# Patient Record
Sex: Female | Born: 1935 | Race: White | Hispanic: No | Marital: Married | Smoking: Former smoker
Health system: Southern US, Community
[De-identification: ages and names within clinical notes are randomized; demographics above are authoritative.]

## PROBLEM LIST (undated history)

## (undated) DIAGNOSIS — D649 Anemia, unspecified: Secondary | ICD-10-CM

## (undated) DIAGNOSIS — R519 Headache, unspecified: Secondary | ICD-10-CM

## (undated) DIAGNOSIS — M940 Chondrocostal junction syndrome [Tietze]: Secondary | ICD-10-CM

## (undated) DIAGNOSIS — I5181 Takotsubo syndrome: Secondary | ICD-10-CM

## (undated) DIAGNOSIS — J189 Pneumonia, unspecified organism: Secondary | ICD-10-CM

## (undated) DIAGNOSIS — L0390 Cellulitis, unspecified: Secondary | ICD-10-CM

## (undated) DIAGNOSIS — E039 Hypothyroidism, unspecified: Secondary | ICD-10-CM

## (undated) DIAGNOSIS — G250 Essential tremor: Secondary | ICD-10-CM

## (undated) DIAGNOSIS — B029 Zoster without complications: Secondary | ICD-10-CM

## (undated) DIAGNOSIS — I219 Acute myocardial infarction, unspecified: Secondary | ICD-10-CM

## (undated) DIAGNOSIS — M1990 Unspecified osteoarthritis, unspecified site: Secondary | ICD-10-CM

## (undated) DIAGNOSIS — R51 Headache: Secondary | ICD-10-CM

## (undated) HISTORY — DX: Hypothyroidism, unspecified: E03.9

## (undated) HISTORY — DX: Takotsubo syndrome: I51.81

## (undated) HISTORY — DX: Chondrocostal junction syndrome (tietze): M94.0

## (undated) HISTORY — DX: Unspecified osteoarthritis, unspecified site: M19.90

## (undated) HISTORY — DX: Anemia, unspecified: D64.9

## (undated) HISTORY — DX: Cellulitis, unspecified: L03.90

## (undated) HISTORY — PX: ABDOMINAL HYSTERECTOMY: SHX81

## (undated) HISTORY — PX: INGUINAL HERNIA REPAIR: SUR1180

---

## 1997-04-20 ENCOUNTER — Ambulatory Visit (HOSPITAL_COMMUNITY): Admission: RE | Admit: 1997-04-20 | Discharge: 1997-04-20 | Payer: Self-pay | Admitting: *Deleted

## 1998-04-02 ENCOUNTER — Ambulatory Visit (HOSPITAL_COMMUNITY): Admission: RE | Admit: 1998-04-02 | Discharge: 1998-04-02 | Payer: Self-pay | Admitting: Gynecology

## 1998-04-02 ENCOUNTER — Encounter: Payer: Self-pay | Admitting: Gynecology

## 1998-04-16 ENCOUNTER — Other Ambulatory Visit: Admission: RE | Admit: 1998-04-16 | Discharge: 1998-04-16 | Payer: Self-pay | Admitting: Gynecology

## 1999-04-08 ENCOUNTER — Encounter: Payer: Self-pay | Admitting: Gynecology

## 1999-04-08 ENCOUNTER — Ambulatory Visit (HOSPITAL_COMMUNITY): Admission: RE | Admit: 1999-04-08 | Discharge: 1999-04-08 | Payer: Self-pay | Admitting: Gynecology

## 1999-05-13 ENCOUNTER — Encounter: Payer: Self-pay | Admitting: Gynecology

## 1999-05-13 ENCOUNTER — Encounter: Admission: RE | Admit: 1999-05-13 | Discharge: 1999-05-13 | Payer: Self-pay | Admitting: Gynecology

## 2000-04-12 ENCOUNTER — Ambulatory Visit (HOSPITAL_COMMUNITY): Admission: RE | Admit: 2000-04-12 | Discharge: 2000-04-12 | Payer: Self-pay | Admitting: Gynecology

## 2000-04-12 ENCOUNTER — Encounter: Payer: Self-pay | Admitting: Gynecology

## 2000-04-20 ENCOUNTER — Other Ambulatory Visit: Admission: RE | Admit: 2000-04-20 | Discharge: 2000-04-20 | Payer: Self-pay | Admitting: Gynecology

## 2001-03-28 ENCOUNTER — Encounter: Admission: RE | Admit: 2001-03-28 | Discharge: 2001-03-28 | Payer: Self-pay | Admitting: Family Medicine

## 2001-03-28 ENCOUNTER — Encounter: Payer: Self-pay | Admitting: Family Medicine

## 2001-04-14 ENCOUNTER — Ambulatory Visit (HOSPITAL_COMMUNITY): Admission: RE | Admit: 2001-04-14 | Discharge: 2001-04-14 | Payer: Self-pay | Admitting: Gynecology

## 2001-04-14 ENCOUNTER — Encounter: Payer: Self-pay | Admitting: Gynecology

## 2001-04-26 ENCOUNTER — Other Ambulatory Visit: Admission: RE | Admit: 2001-04-26 | Discharge: 2001-04-26 | Payer: Self-pay | Admitting: Gynecology

## 2002-04-17 ENCOUNTER — Ambulatory Visit (HOSPITAL_COMMUNITY): Admission: RE | Admit: 2002-04-17 | Discharge: 2002-04-17 | Payer: Self-pay | Admitting: Gynecology

## 2002-04-17 ENCOUNTER — Encounter: Payer: Self-pay | Admitting: Gynecology

## 2002-05-02 ENCOUNTER — Other Ambulatory Visit: Admission: RE | Admit: 2002-05-02 | Discharge: 2002-05-02 | Payer: Self-pay | Admitting: Gynecology

## 2003-04-20 ENCOUNTER — Ambulatory Visit (HOSPITAL_COMMUNITY): Admission: RE | Admit: 2003-04-20 | Discharge: 2003-04-20 | Payer: Self-pay | Admitting: Gynecology

## 2003-05-08 ENCOUNTER — Other Ambulatory Visit: Admission: RE | Admit: 2003-05-08 | Discharge: 2003-05-08 | Payer: Self-pay | Admitting: Gynecology

## 2003-12-19 ENCOUNTER — Ambulatory Visit (HOSPITAL_COMMUNITY): Admission: RE | Admit: 2003-12-19 | Discharge: 2003-12-19 | Payer: Self-pay | Admitting: Gynecology

## 2003-12-19 ENCOUNTER — Ambulatory Visit (HOSPITAL_BASED_OUTPATIENT_CLINIC_OR_DEPARTMENT_OTHER): Admission: RE | Admit: 2003-12-19 | Discharge: 2003-12-19 | Payer: Self-pay | Admitting: Gynecology

## 2003-12-19 ENCOUNTER — Encounter (INDEPENDENT_AMBULATORY_CARE_PROVIDER_SITE_OTHER): Payer: Self-pay | Admitting: *Deleted

## 2004-04-24 ENCOUNTER — Ambulatory Visit (HOSPITAL_COMMUNITY): Admission: RE | Admit: 2004-04-24 | Discharge: 2004-04-24 | Payer: Self-pay | Admitting: Gynecology

## 2004-05-13 ENCOUNTER — Other Ambulatory Visit: Admission: RE | Admit: 2004-05-13 | Discharge: 2004-05-13 | Payer: Self-pay | Admitting: Gynecology

## 2005-05-04 ENCOUNTER — Ambulatory Visit (HOSPITAL_COMMUNITY): Admission: RE | Admit: 2005-05-04 | Discharge: 2005-05-04 | Payer: Self-pay | Admitting: Family Medicine

## 2005-07-27 ENCOUNTER — Encounter: Admission: RE | Admit: 2005-07-27 | Discharge: 2005-07-27 | Payer: Self-pay | Admitting: Endocrinology

## 2005-08-14 ENCOUNTER — Encounter: Admission: RE | Admit: 2005-08-14 | Discharge: 2005-08-14 | Payer: Self-pay | Admitting: Endocrinology

## 2006-05-06 ENCOUNTER — Ambulatory Visit (HOSPITAL_COMMUNITY): Admission: RE | Admit: 2006-05-06 | Discharge: 2006-05-06 | Payer: Self-pay | Admitting: Family Medicine

## 2006-05-25 ENCOUNTER — Ambulatory Visit (HOSPITAL_COMMUNITY): Admission: RE | Admit: 2006-05-25 | Discharge: 2006-05-25 | Payer: Self-pay | Admitting: Family Medicine

## 2007-04-24 ENCOUNTER — Emergency Department (HOSPITAL_COMMUNITY): Admission: EM | Admit: 2007-04-24 | Discharge: 2007-04-24 | Payer: Self-pay | Admitting: Emergency Medicine

## 2007-05-18 ENCOUNTER — Ambulatory Visit (HOSPITAL_COMMUNITY): Admission: RE | Admit: 2007-05-18 | Discharge: 2007-05-18 | Payer: Self-pay | Admitting: Family Medicine

## 2008-05-21 ENCOUNTER — Ambulatory Visit (HOSPITAL_COMMUNITY): Admission: RE | Admit: 2008-05-21 | Discharge: 2008-05-21 | Payer: Self-pay | Admitting: Family Medicine

## 2008-05-29 ENCOUNTER — Ambulatory Visit (HOSPITAL_COMMUNITY): Admission: RE | Admit: 2008-05-29 | Discharge: 2008-05-29 | Payer: Self-pay | Admitting: Family Medicine

## 2008-09-02 ENCOUNTER — Inpatient Hospital Stay (HOSPITAL_COMMUNITY): Admission: EM | Admit: 2008-09-02 | Discharge: 2008-09-03 | Payer: Self-pay | Admitting: Emergency Medicine

## 2008-09-08 ENCOUNTER — Ambulatory Visit (HOSPITAL_COMMUNITY): Admission: RE | Admit: 2008-09-08 | Discharge: 2008-09-08 | Payer: Self-pay | Admitting: Gastroenterology

## 2008-10-07 ENCOUNTER — Emergency Department (HOSPITAL_COMMUNITY): Admission: EM | Admit: 2008-10-07 | Discharge: 2008-10-07 | Payer: Self-pay | Admitting: Emergency Medicine

## 2008-10-26 ENCOUNTER — Ambulatory Visit (HOSPITAL_COMMUNITY): Admission: RE | Admit: 2008-10-26 | Discharge: 2008-10-26 | Payer: Self-pay | Admitting: General Surgery

## 2008-12-27 ENCOUNTER — Emergency Department (HOSPITAL_COMMUNITY): Admission: EM | Admit: 2008-12-27 | Discharge: 2008-12-27 | Payer: Self-pay | Admitting: Emergency Medicine

## 2009-02-12 ENCOUNTER — Emergency Department (HOSPITAL_COMMUNITY): Admission: EM | Admit: 2009-02-12 | Discharge: 2009-02-12 | Payer: Self-pay | Admitting: Emergency Medicine

## 2009-05-23 ENCOUNTER — Ambulatory Visit (HOSPITAL_COMMUNITY): Admission: RE | Admit: 2009-05-23 | Discharge: 2009-05-23 | Payer: Self-pay | Admitting: Family Medicine

## 2009-06-18 ENCOUNTER — Emergency Department (HOSPITAL_COMMUNITY): Admission: EM | Admit: 2009-06-18 | Discharge: 2009-06-18 | Payer: Self-pay | Admitting: Emergency Medicine

## 2009-10-19 ENCOUNTER — Emergency Department (HOSPITAL_COMMUNITY): Admission: EM | Admit: 2009-10-19 | Discharge: 2009-10-19 | Payer: Self-pay | Admitting: Emergency Medicine

## 2010-03-16 ENCOUNTER — Encounter: Payer: Self-pay | Admitting: Family Medicine

## 2010-04-07 ENCOUNTER — Ambulatory Visit (HOSPITAL_COMMUNITY)
Admission: RE | Admit: 2010-04-07 | Discharge: 2010-04-07 | Disposition: A | Discharge status: Home / Self Care | Payer: Medicare Other | Source: Ambulatory Visit | Attending: Family Medicine | Admitting: Family Medicine

## 2010-04-07 DIAGNOSIS — M79609 Pain in unspecified limb: Secondary | ICD-10-CM

## 2010-04-07 DIAGNOSIS — M7989 Other specified soft tissue disorders: Secondary | ICD-10-CM | POA: Insufficient documentation

## 2010-04-14 ENCOUNTER — Other Ambulatory Visit (HOSPITAL_COMMUNITY): Payer: Self-pay | Admitting: Family Medicine

## 2010-04-14 DIAGNOSIS — Z1231 Encounter for screening mammogram for malignant neoplasm of breast: Secondary | ICD-10-CM

## 2010-05-13 LAB — DIFFERENTIAL
Basophils Relative: 1 % (ref 0–1)
Eosinophils Absolute: 0 10*3/uL (ref 0.0–0.7)
Eosinophils Relative: 0 % (ref 0–5)
Lymphs Abs: 1.4 10*3/uL (ref 0.7–4.0)
Monocytes Relative: 12 % (ref 3–12)
Neutrophils Relative %: 62 % (ref 43–77)

## 2010-05-13 LAB — URINE MICROSCOPIC-ADD ON

## 2010-05-13 LAB — COMPREHENSIVE METABOLIC PANEL
ALT: 20 U/L (ref 0–35)
Albumin: 3.2 g/dL — ABNORMAL LOW (ref 3.5–5.2)
Chloride: 93 mEq/L — ABNORMAL LOW (ref 96–112)
GFR calc Af Amer: >60 mL/min (ref >60)
Glucose, Bld: 101 mg/dL — ABNORMAL HIGH (ref 70–99)
Potassium: 3.5 mEq/L (ref 3.5–5.1)
Sodium: 124 mEq/L — ABNORMAL LOW (ref 135–145)

## 2010-05-13 LAB — CBC
Hemoglobin: 7.8 g/dL — ABNORMAL LOW (ref 12.0–15.0)
MCHC: 34.3 g/dL (ref 30.0–36.0)
MCV: 73.1 fL — ABNORMAL LOW (ref 78.0–100.0)
Platelets: 385 10*3/uL (ref 150–400)

## 2010-05-13 LAB — URINALYSIS, ROUTINE W REFLEX MICROSCOPIC
Glucose, UA: NEGATIVE mg/dL
Hgb urine dipstick: NEGATIVE
Ketones, ur: NEGATIVE mg/dL
Nitrite: NEGATIVE

## 2010-05-13 LAB — URINE CULTURE

## 2010-05-26 ENCOUNTER — Ambulatory Visit (HOSPITAL_COMMUNITY): Payer: Medicare Other

## 2010-05-26 ENCOUNTER — Ambulatory Visit (HOSPITAL_COMMUNITY)
Admission: RE | Admit: 2010-05-26 | Discharge: 2010-05-26 | Disposition: A | Discharge status: Home / Self Care | Payer: Medicare Other | Source: Ambulatory Visit | Attending: Family Medicine | Admitting: Family Medicine

## 2010-05-26 DIAGNOSIS — Z1231 Encounter for screening mammogram for malignant neoplasm of breast: Secondary | ICD-10-CM | POA: Insufficient documentation

## 2010-05-26 LAB — BASIC METABOLIC PANEL
CO2: 27 mEq/L (ref 19–32)
Chloride: 92 mEq/L — ABNORMAL LOW (ref 96–112)
GFR calc Af Amer: >60 mL/min (ref >60)
Potassium: 3.8 mEq/L (ref 3.5–5.1)
Sodium: 125 mEq/L — ABNORMAL LOW (ref 135–145)

## 2010-05-26 LAB — URINALYSIS, ROUTINE W REFLEX MICROSCOPIC
Glucose, UA: NEGATIVE mg/dL
Hgb urine dipstick: NEGATIVE
Ketones, ur: NEGATIVE mg/dL
Protein, ur: NEGATIVE mg/dL
Urobilinogen, UA: 0.2 mg/dL (ref 0.0–1.0)

## 2010-05-26 LAB — URINE MICROSCOPIC-ADD ON

## 2010-05-26 LAB — POCT CARDIAC MARKERS
CKMB, poc: 1.8 ng/mL (ref 1.0–8.0)
CKMB, poc: 2.8 ng/mL (ref 1.0–8.0)
Myoglobin, poc: 101 ng/mL (ref 12–200)
Myoglobin, poc: 62.9 ng/mL (ref 12–200)
Troponin i, poc: <0.05 ng/mL (ref 0.00–0.09)

## 2010-05-26 LAB — CBC
HCT: 30.1 % — ABNORMAL LOW (ref 36.0–46.0)
Hemoglobin: 10.1 g/dL — ABNORMAL LOW (ref 12.0–15.0)
MCHC: 33.5 g/dL (ref 30.0–36.0)
MCV: 90.1 fL (ref 78.0–100.0)
Platelets: 310 10*3/uL (ref 150–400)
RBC: 3.34 MIL/uL — ABNORMAL LOW (ref 3.87–5.11)
RDW: 13.4 % (ref 11.5–15.5)
WBC: 8.7 10*3/uL (ref 4.0–10.5)

## 2010-05-26 LAB — DIFFERENTIAL
Basophils Relative: 1 % (ref 0–1)
Lymphocytes Relative: 9 % — ABNORMAL LOW (ref 12–46)
Lymphs Abs: 0.8 10*3/uL (ref 0.7–4.0)
Monocytes Relative: 9 % (ref 3–12)
Neutro Abs: 7.1 10*3/uL (ref 1.7–7.7)
Neutrophils Relative %: 82 % — ABNORMAL HIGH (ref 43–77)

## 2010-05-26 LAB — CK TOTAL AND CKMB (NOT AT ARMC): Total CK: 101 U/L (ref 7–177)

## 2010-05-30 LAB — BASIC METABOLIC PANEL
CO2: 30 mEq/L (ref 19–32)
Chloride: 98 mEq/L (ref 96–112)
Creatinine, Ser: 0.65 mg/dL (ref 0.4–1.2)
GFR calc Af Amer: >60 mL/min (ref >60)

## 2010-05-31 LAB — DIFFERENTIAL
Eosinophils Absolute: 0 10*3/uL (ref 0.0–0.7)
Eosinophils Relative: 0 % (ref 0–5)
Lymphs Abs: 0.5 10*3/uL — ABNORMAL LOW (ref 0.7–4.0)
Monocytes Relative: 4 % (ref 3–12)
Neutrophils Relative %: 92 % — ABNORMAL HIGH (ref 43–77)

## 2010-05-31 LAB — URINE CULTURE: Colony Count: NO GROWTH

## 2010-05-31 LAB — URINE MICROSCOPIC-ADD ON

## 2010-05-31 LAB — BASIC METABOLIC PANEL
CO2: 30 mEq/L (ref 19–32)
Glucose, Bld: 79 mg/dL (ref 70–99)
Potassium: 3.9 mEq/L (ref 3.5–5.1)
Sodium: 129 mEq/L — ABNORMAL LOW (ref 135–145)

## 2010-05-31 LAB — URINALYSIS, ROUTINE W REFLEX MICROSCOPIC
Glucose, UA: NEGATIVE mg/dL
Ketones, ur: NEGATIVE mg/dL
Leukocytes, UA: NEGATIVE
Specific Gravity, Urine: 1.02 (ref 1.005–1.030)
pH: 8.5 — ABNORMAL HIGH (ref 5.0–8.0)

## 2010-05-31 LAB — CBC
HCT: 33.7 % — ABNORMAL LOW (ref 36.0–46.0)
Hemoglobin: 11.6 g/dL — ABNORMAL LOW (ref 12.0–15.0)
MCHC: 34.5 g/dL (ref 30.0–36.0)
MCHC: 34.6 g/dL (ref 30.0–36.0)
RDW: 12.6 % (ref 11.5–15.5)
RDW: 13 % (ref 11.5–15.5)

## 2010-05-31 LAB — COMPREHENSIVE METABOLIC PANEL
ALT: 22 U/L (ref 0–35)
AST: 45 U/L — ABNORMAL HIGH (ref 0–37)
Calcium: 9.7 mg/dL (ref 8.4–10.5)
GFR calc Af Amer: >60 mL/min (ref >60)
Sodium: 128 mEq/L — ABNORMAL LOW (ref 135–145)
Total Protein: 7.2 g/dL (ref 6.0–8.3)

## 2010-06-01 LAB — URINE CULTURE

## 2010-06-01 LAB — DIFFERENTIAL
Basophils Relative: 0 % (ref 0–1)
Lymphs Abs: 1.7 10*3/uL (ref 0.7–4.0)
Monocytes Relative: 9 % (ref 3–12)
Neutro Abs: 5.3 10*3/uL (ref 1.7–7.7)
Neutrophils Relative %: 68 % (ref 43–77)

## 2010-06-01 LAB — CBC
Hemoglobin: 11.1 g/dL — ABNORMAL LOW (ref 12.0–15.0)
Platelets: 245 10*3/uL (ref 150–400)
RBC: 3.99 MIL/uL (ref 3.87–5.11)
RDW: 13.4 % (ref 11.5–15.5)
WBC: 7.8 10*3/uL (ref 4.0–10.5)

## 2010-06-01 LAB — STOOL CULTURE

## 2010-06-01 LAB — BASIC METABOLIC PANEL
BUN: 1 mg/dL — ABNORMAL LOW (ref 6–23)
Calcium: 8.9 mg/dL (ref 8.4–10.5)
Calcium: 9.5 mg/dL (ref 8.4–10.5)
Creatinine, Ser: 0.59 mg/dL (ref 0.4–1.2)
Creatinine, Ser: 0.62 mg/dL (ref 0.4–1.2)
GFR calc Af Amer: >60 mL/min (ref >60)
GFR calc Af Amer: >60 mL/min (ref >60)
GFR calc non Af Amer: >60 mL/min (ref >60)
Glucose, Bld: 84 mg/dL (ref 70–99)
Sodium: 134 mEq/L — ABNORMAL LOW (ref 135–145)

## 2010-06-01 LAB — URINALYSIS, ROUTINE W REFLEX MICROSCOPIC
Glucose, UA: NEGATIVE mg/dL
Hgb urine dipstick: NEGATIVE
Protein, ur: NEGATIVE mg/dL
Specific Gravity, Urine: 1.015 (ref 1.005–1.030)
pH: >9 — ABNORMAL HIGH (ref 5.0–8.0)

## 2010-06-01 LAB — HEPATIC FUNCTION PANEL
ALT: 17 U/L (ref 0–35)
AST: 28 U/L (ref 0–37)
Albumin: 3.9 g/dL (ref 3.5–5.2)
Alkaline Phosphatase: 68 U/L (ref 39–117)
Total Bilirubin: 0.2 mg/dL — ABNORMAL LOW (ref 0.3–1.2)

## 2010-06-01 LAB — CLOSTRIDIUM DIFFICILE EIA: C difficile Toxins A+B, EIA: NEGATIVE

## 2010-06-01 LAB — URINE MICROSCOPIC-ADD ON

## 2010-06-01 LAB — TSH: TSH: 4.193 u[IU]/mL (ref 0.350–4.500)

## 2010-06-01 LAB — T4, FREE: Free T4: 0.77 ng/dL — ABNORMAL LOW (ref 0.80–1.80)

## 2010-07-08 NOTE — H&P (Signed)
Charlene Vincent, Charlene Vincent            ACCOUNT NO.:  [id number]   MEDICAL RECORD NO.:  [id number]          PATIENT TYPE:  AMB   LOCATION:  DAY                           FACILITY:  [facility]   PHYSICIAN:  Tilford Pillar, MD      DATE OF BIRTH:  07/26/1934   DATE OF ADMISSION:  DATE OF DISCHARGE:  LH                              HISTORY & PHYSICAL   CHIEF COMPLAINT:  Left inguinal hernia.   HISTORY OF PRESENT ILLNESS:  The patient is a 76 year old female who  initially presented to my office with a history of a longstanding bulge  in her left groin.  She states she has had this all her life.  She has  had this since she was 76 years old.  She has noted some increase in the  size, but she has had some increasing discomfort associated with it.  She does state that by her description it reduces at night and with  palpation.  She has never had any signs or symptoms of incarceration.  She has had no nausea and vomiting.  No change in bowel movements.  No  prior history of right inguinal hernia.  After initial evaluation in my  office, the patient did wish to wait and continue conservative  management, however, with her increasing discomfort, the patient  returned at this time.  She states she has had some increase  difficulties with reduction, although she still is able to reduce it.  While it is protruding, she describes crampy, colicky pain which  resolves upon reduction.  The patient has had no emesis.  No fevers and  chills.  No skin changes over the area.   PAST MEDICAL HISTORY:  Hypothyroidism, gastroesophageal reflux disease,  and seasonal allergies.   PAST SURGICAL HISTORY:  Hysterectomy.   MEDICATIONS:  Zyrtec, Protonix, and Synthroid.   ALLERGIES:  PENICILLIN and ASPIRIN, although the patient is unaware of  what the reaction is.   SOCIAL HISTORY:  She is half pack per day smoker.  No alcohol use.  No  recreational drug use.  Occupation; she is retired.   No significant pertinent  family history.   REVIEW OF SYSTEMS:  CONSTITUTIONAL:  Unremarkable.  EYES:  Unremarkable.  EARS, NOSE, and THROAT:  Unremarkable.  RESPIRATORY:  Unremarkable.  CARDIOVASCULAR:  Unremarkable.  GASTROINTESTINAL:  As per HPI, otherwise  unremarkable.  GENITOURINARY:  Yeast infection which is resolving.  MUSCULOSKELETAL:  Unremarkable.  SKIN:  Yeast infection which is  resolving.  ENDOCRINE:  Unremarkable.  NEURO:  Unremarkable.   PHYSICAL EXAMINATION:  GENERAL:  The patient is elderly-appearing  female.  She is calm.  She is alert, oriented, although somewhat  confused as to her reason for initially being at my office.  HEENT:  Scalp, no deformities, no masses.  Eyes; pupils are equal,  round, and reactive.  Extraocular movements are intact.  No conjunctival  pallor.  Oral mucosa is pink.  Normal occlusion.  NECK:  Trachea is midline.  No cervical lymphadenopathy.  PULMONARY:  Unlabored respirations.  No wheezes.  No crackles.  She is  clear to auscultation bilaterally.  CARDIOVASCULAR:  Regular rate and rhythm.  No murmurs or gallops.  She  has 2+ radial and femoral pulses bilaterally.  ABDOMEN:  Positive bowel sounds.  Abdomen is soft, nontender.  She has a  reducible left inguinal hernia with no significant discomfort.  She has  had no additional masses.  No other hernias are apparent.  EXTREMITIES:  Warm and dry.   Pertinent labs and radiographic studies were unavailable.   ASSESSMENT AND PLAN:  Reducible left inguinal hernia.  I did have a long  discussion with the patient on both of her presentations as an  outpatient to my office in regards to the findings and the risks,  benefits, and alternatives of an inguinal herniorrhaphy with mesh.  The  patient's questions and concerns were addressed and the patient does  state understanding of the risks including but not limited to the risk  of bleeding, infection, infection of the mesh requiring removal, and  subsequent repair upon  resolution of the infection, nerve entrapment,  chronic pain paresthesias as well as possibility of intraoperative  cardiac and pulmonary events.  The patient's questions and concerns were  addressed and the patient will be consented and scheduled for operative  intervention at her earliest convenience.      Tilford Pillar, MD  Electronically Signed     BZ/MEDQ  D:  10/25/2008  T:  10/26/2008  Job:  [id number]

## 2010-07-08 NOTE — H&P (Signed)
Charlene Vincent            ACCOUNT NO.:  [id number]   MEDICAL RECORD NO.:  [id number]          PATIENT TYPE:  INP   LOCATION:  A327                          FACILITY:  [facility]   PHYSICIAN:  Margaretmary Dys, M.D.DATE OF BIRTH:  02-01-1935   DATE OF ADMISSION:  09/01/2008  DATE OF DISCHARGE:  LH                              HISTORY & PHYSICAL   ADMISSION DIAGNOSES:  1. Acute abdominal pain.  2. Diarrhea / gastroenteritis  3. Probable Crohn disease.  4. Probable urinary tract infection.   CHIEF COMPLAINT:  Abdominal pain / diarrhea of 1 day duration.   HISTORY OF PRESENT ILLNESS:  Charlene Vincent is a 76 year old female who  presented to the emergency room complaining of abdominal pain.  The  patient reports having some diffuse abdominal pain, mostly around her  umbilical area.  The pain was not really radiating, the patient reports  as colicky and crampy like.  There was also a history with multiple  diarrhea episodes.  The patient had a single episode of nausea and  vomiting in the emergency room.  She denies any fevers or chills.  The  patient has had a fair amount of bowel movement which was much more than  she has had in the past.  The patient gives a history of what appears to  be chronic diarrhea where she has a fair amount of stools every day.  The patient reports going to the bathroom almost three to four times a  day with large amount of stools.  Her weight has remained about the  same.  The patient reported having a colonoscopy performed about  2 to 3  years ago in [place] which was reported to be negative except for  some hemorrhoids.  The patient has never been told if she has Crohn  disease or any other abnormality causing her frequent bowel movements.  She denies any cough or shortness of breath.   REVIEW OF SYSTEMS:  As mentioned in history present illness above.   PAST MEDICAL HISTORY:  1. Hypothyroidism.  2. History of chronic diarrhea status post  colonoscopy about 2 years      ago, which appeared to slightly improve.  3. Gastroesophageal reflux disease.   MEDICATIONS:  The patient is on  1. Synthroid 25 mcg p.o. once a day.  2. Protonix 40 mg p.o. once a day.  3. Flonase 2 sprays in each nostril twice a day.   ALLERGIES:  1. The patient reports allergies to penicillin, she gets hives on the      rash.  2. Aspirin.  3. Seasonal allergies.   FAMILY HISTORY:  Noncontributory.  No family history of bowel trouble or  cancer, according to the patient.   SOCIAL HISTORY:  The patient is married, has been married for about 43  years.  Lives with her husband.  She smokes half pack of cigarettes a  day.  She completed ninth grade of education.  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   She denies any illicit drug use.   PHYSICAL EXAMINATION:  GENERAL:  The patient was conscious, alert,  comfortable, not in acute distress, well oriented in time, place and  person, was very pleasant.  VITAL SIGNS:  Blood pressure blood pressure was 122/70 with a pulse of  70, respiration was 20, temperature 98.5 degrees Fahrenheit.  Oxygen  saturation was 97% on room air.  HEENT: Normocephalic, atraumatic.  Oral mucosa was dry.  No exudates  were noted.  NECK:  Supple.  No JVD or lymphadenopathy.  LUNGS:  Clear clinically with good air entry bilaterally.  HEART:  S1, S2 is regular.  No S3, S4, gallops or rubs.  ABDOMEN:  Not distended, was soft, nontender.  Bowel sounds were  positive.  No masses palpable.  EXTREMITIES:  No edema.  No calf induration or tenderness was noted.  CNS:  Exam was grossly intact with no focal neurological deficits.   LABORATORY AND DIAGNOSTIC DATA:  White blood cell count 7.8, hemoglobin  of 12.8, hematocrit was 37, platelet count was 245.  There was no left  shift.  Sodium was 129, potassium is 3.7, chloride of 93, CO2 was 30,  glucose 117, BUN is 1, creatinine was 0.62.   Liver function tests were  normal.  Calcium was 9.5, lipase of 31.  Urinalysis shows hazy in  appearance, pH was elevated at 9.0, about 7-10 WBCs and a few bacteria  and this has been sent for culture.   An abdominal x-ray obtained emergency room showed possible small bowel  obstruction with changes of COPD.  On x-ray the patient also had some  diffuse osteopenia.  Subsequent CT of the abdomen and pelvis obtained  revealed mild wall thickening of the ileum, with questionable enteritis  and possible Crohn disease or infectious / inflammatory disease.  There  was no evidence of small-bowel obstruction or any other abnormalities.   ASSESSMENT/PLAN:  This is a 76 year old female who presented to  emergency room with nausea and vomiting and diarrhea which appeared to  be worse from her baseline.  This could be entirely viral  gastroenteritis.  However, the patient's history of diarrhea concerns me  over several years.  CT scan also suggesting some abnormalities or  inflammatory in the terminal ileum to suggest possible Crohn disease.   PLAN:  1. The patient be admitted to the medical floor.  2. Will place on clear liquid diet.  3. Will start on IV fluids normal saline at 75 mL an hour.  4. Will request her medical records from her primary care physician      and also from gastroenterologist in [place].  5. Will resume home medications at this time.  6. Will start on some Cipro 500 mg b.i.d. for the suspected urinary      tract infection.  This may also help if this is potential Crohn's      or acute bacterial gastroenteritis.  7. DVT prophylaxis will be with Lovenox.  8. I have also requested for a GI consult here with      gastroenterologist.  The patient reports having a colonoscopy      performed about 2 years ago with no abnormalities, according to the      patient.  We will request the records of this colonoscopy.   I have explained the above plan to the patient and indicated  this could  be a simple gastroenteritis, possibly viral versus bacteria, but I have  concerns that the patient may have a previously undiagnosed of Crohn  disease.  She verbalized full  understanding.  The patient's stools were  not bloody.      Margaretmary Dys, M.D.  Electronically Signed     AM/MEDQ  D:  09/02/2008  T:  09/02/2008  Job:  [id number]

## 2010-07-08 NOTE — Discharge Summary (Signed)
NAMEDANAKA, LLERA NO.:  [id number]   MEDICAL RECORD NO.:  [id number]          PATIENT TYPE:  INP   LOCATION:  A327                          FACILITY:  [facility]   PHYSICIAN:  Osvaldo Shipper, MD     DATE OF BIRTH:  15-Oct-1934   DATE OF ADMISSION:  09/01/2008  DATE OF DISCHARGE:  07/12/2010LH                               DISCHARGE SUMMARY   The patient's PMD is actually [facility], the name of  the [facility] is not known.   Please review H and P dictated at the time of admission for details  regarding the patient's presenting illness.   DISCHARGE DIAGNOSES:  1. Acute gastroenteritis, resolved.  2. Ileitis/enteritis on CT, likely a result of acute gastroenteritis.  3. History of chronic abdominal pain, etiology unclear.  4. History of hypothyroidism.  5. History of osteoporosis.  6. History of acid reflux disease.   BRIEF HOSPITAL COURSE:  Briefly, this is a 76 year old Caucasian female  who presented to the hospital with complaints of abdominal pain, nausea,  vomiting, and diarrhea.  The patient was afebrile, had a normal white  count.  She was found to be dehydrated with a sodium of 129.  The  patient was admitted to the hospital.  She underwent CT of the abdomen  and pelvis in the ED, which showed large amount of stool in the right  colon.  Small to moderate hiatal hernia was noted.  She was noted to  also have mild wall thickening of the ileum raising a question of  enteritis and Crohn disease.  Because of her acute symptoms, it is felt  that this likely represents gastroenteritis.  However, the patient did  tell us that she has been having chronic abdominal pain and loose stools  for the past 1-1/2 years.  She had a colonoscopy in 2009, which showed  sessile polyp, hemorrhoids, and diverticula.  The patient rapidly  improved in the hospital.  She did not have anymore nausea, vomiting,  and her diarrhea also subsided.  She has been  tolerating p.o. intake  quite well.  She has an appointment with Dr. Elnoria Howard on September 05, 2008, and  so we decided not to have our gastroenterologists see her.  She has been  told to keep this appointment with the gastroenterologist.   She was also found to have a UTI, which was treated with Cipro.  This  will be continued for few more days.  Rest of her medical issues  remained stable.   On the day of discharge, the patient is feeling well.  She wants to go  home, had 2 soft stools this morning, but no diarrhea.  Denies any  nausea or vomiting, has been ambulating with no difficulties.  Vital  signs are all stable including her blood pressure, which is 116/66.  Abdomen is soft, nontender, and nondistended.  Bowel sounds are present.  No masses were appreciated.  No hepatosplenomegaly is noted.   This morning, her white cell count is 3.7, hemoglobin is 11.1, and MCV  is 91.  Sodium is 134.   PENDING STUDIES:  TSH  and free T4 to ensure that she was adequately  treated for her hypothyroidism considering constipation that was noted  on the CT with moderate stool burden.   We would also recommend to the PMD considering her anemia this morning  to do an anemia panel and because her white cell count is also slightly  low, a B12 level may be of benefit.   She is stable for discharge.   DISCHARGE MEDICATIONS:  Include the following:  1. Cipro 500 mg b.i.d. for 5 days.  2. Protonix 40 mg daily.  3. Synthroid 25 mcg daily.  4. Flonase 2 sprays to each nostril daily.  5. Tylenol as needed.  6. Motrin as needed.  7. Metamucil to be discontinued for now until she is seen by Dr. Elnoria Howard.  8. Fosamax 70 mg weekly.   She has been told not to work till September 10, 2008.  She does house  cleaning work.   DIET:  As before.   PHYSICAL ACTIVITY:  As before.   FOLLOWUP:  Follow up with Dr. Elnoria Howard on September 05, 2008, and then the PMD as  needed.   No consultations obtained during this admission.    IMAGING STUDIES:  CT abdomen and pelvis as discussed above.  No  procedures performed during this admission.   Total time of this encounter, 35 minutes.      Osvaldo Shipper, MD  Electronically Signed     GK/MEDQ  D:  09/03/2008  T:  09/04/2008  Job:  [id number]   cc:   Jordan Hawks. Elnoria Howard, MD  Fax: [phone]   [facility]

## 2010-07-11 NOTE — Op Note (Signed)
Charlene Vincent, Charlene Vincent            ACCOUNT NO.:  [id number]   MEDICAL RECORD NO.:  [id number]          PATIENT TYPE:  AMB   LOCATION:  NESC                         FACILITY:  [facility]   PHYSICIAN:  Gretta Cool, M.D. 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   PROCEDURE:  Wide excision of multiple sites suspicious for vulvar carcinoma  in situ or VIN.   SURGEON:  Gretta Cool, M.D.   ANESTHESIA:  IV sedation and local infiltration.   DESCRIPTION OF PROCEDURE:  Under excellent anesthesia as above with local  infiltration anesthesia, multiple sites were identified suspicious for  vulvar carcinoma in situ.  The sites were anesthetized and then excised  beyond the margin by 5 mm or so.  On the patient's left, there were four  sites adjacent excised individually, but then extended as a block, and  closed with interrupted sutures of 4-0 nylon.  One other site on the left  was excised.  Just below the introitus on the posterior fourchette, there  was a pigmented area that was flat suspicious of vulvar CIN.  That was  excised with margin of 5 mm.  There were four other sites on the right labia  majora that were then shaved, excised.  Two were large enough to require  suturing.  At this point, all the specimens were submitted for pathologic  examination, isolated only as to right/left, and the posterior fourchette  pigmented lesion.      CWL/MEDQ  D:  12/19/2003  T:  12/19/2003  Job:  [id number]

## 2010-09-05 ENCOUNTER — Telehealth: Payer: Self-pay | Admitting: Cardiology

## 2010-09-05 NOTE — Telephone Encounter (Signed)
Patient experienced CP today and representative at Dr.Moreira's office would like the patient to have a cardiac exam within the next 10 days.

## 2010-09-05 NOTE — Telephone Encounter (Signed)
Per Pam in Dr. Johnna Acosta Moreira's office states she was in office today c/o some Chest pain; they did EKG which had slight change. Chest pain is off and on-not constant. Dr. Ludwig Clarks just wanted a cardiac evaluation. Will see next Friday 7/20. To fax notes and EKG.

## 2010-09-09 ENCOUNTER — Encounter: Payer: Self-pay | Admitting: Cardiology

## 2010-09-10 ENCOUNTER — Emergency Department (HOSPITAL_COMMUNITY): Payer: Medicare Other

## 2010-09-10 ENCOUNTER — Inpatient Hospital Stay (HOSPITAL_COMMUNITY)
Admission: EM | Admit: 2010-09-10 | Discharge: 2010-09-14 | DRG: 287 | Disposition: A | Discharge status: Home / Self Care | Payer: Medicare Other | Attending: Cardiology | Admitting: Cardiology

## 2010-09-10 ENCOUNTER — Encounter: Payer: Self-pay | Admitting: Cardiology

## 2010-09-10 DIAGNOSIS — I872 Venous insufficiency (chronic) (peripheral): Secondary | ICD-10-CM | POA: Diagnosis present

## 2010-09-10 DIAGNOSIS — R7989 Other specified abnormal findings of blood chemistry: Secondary | ICD-10-CM

## 2010-09-10 DIAGNOSIS — E871 Hypo-osmolality and hyponatremia: Secondary | ICD-10-CM | POA: Diagnosis present

## 2010-09-10 DIAGNOSIS — E039 Hypothyroidism, unspecified: Secondary | ICD-10-CM | POA: Diagnosis present

## 2010-09-10 DIAGNOSIS — R079 Chest pain, unspecified: Secondary | ICD-10-CM

## 2010-09-10 DIAGNOSIS — I2510 Atherosclerotic heart disease of native coronary artery without angina pectoris: Secondary | ICD-10-CM | POA: Diagnosis present

## 2010-09-10 DIAGNOSIS — J309 Allergic rhinitis, unspecified: Secondary | ICD-10-CM | POA: Diagnosis present

## 2010-09-10 DIAGNOSIS — IMO0002 Reserved for concepts with insufficient information to code with codable children: Secondary | ICD-10-CM | POA: Diagnosis present

## 2010-09-10 DIAGNOSIS — Z7982 Long term (current) use of aspirin: Secondary | ICD-10-CM

## 2010-09-10 DIAGNOSIS — I509 Heart failure, unspecified: Secondary | ICD-10-CM | POA: Diagnosis present

## 2010-09-10 DIAGNOSIS — F17200 Nicotine dependence, unspecified, uncomplicated: Secondary | ICD-10-CM | POA: Diagnosis present

## 2010-09-10 DIAGNOSIS — Y9210 Unspecified residential institution as the place of occurrence of the external cause: Secondary | ICD-10-CM | POA: Diagnosis present

## 2010-09-10 DIAGNOSIS — M1990 Unspecified osteoarthritis, unspecified site: Secondary | ICD-10-CM | POA: Diagnosis present

## 2010-09-10 DIAGNOSIS — D649 Anemia, unspecified: Secondary | ICD-10-CM | POA: Diagnosis present

## 2010-09-10 DIAGNOSIS — I5181 Takotsubo syndrome: Principal | ICD-10-CM | POA: Diagnosis present

## 2010-09-10 HISTORY — PX: CARDIAC CATHETERIZATION: SHX172

## 2010-09-10 LAB — CBC
MCV: 84.5 fL (ref 78.0–100.0)
Platelets: 230 10*3/uL (ref 150–400)
RBC: 4.66 MIL/uL (ref 3.87–5.11)
RDW: 15.4 % (ref 11.5–15.5)
WBC: 15 10*3/uL — ABNORMAL HIGH (ref 4.0–10.5)

## 2010-09-10 LAB — TROPONIN I: Troponin I: 0.76 ng/mL (ref <0.30)

## 2010-09-10 LAB — DIFFERENTIAL
Basophils Absolute: 0 10*3/uL (ref 0.0–0.1)
Basophils Relative: 0 % (ref 0–1)
Eosinophils Absolute: 0 10*3/uL (ref 0.0–0.7)
Lymphs Abs: 0.8 10*3/uL (ref 0.7–4.0)
Neutrophils Relative %: 87 % — ABNORMAL HIGH (ref 43–77)

## 2010-09-10 LAB — BASIC METABOLIC PANEL
Calcium: 9.2 mg/dL (ref 8.4–10.5)
Creatinine, Ser: 0.56 mg/dL (ref 0.50–1.10)
GFR calc Af Amer: >60 mL/min (ref >60)
GFR calc non Af Amer: >60 mL/min (ref >60)
Sodium: 126 mEq/L — ABNORMAL LOW (ref 135–145)

## 2010-09-10 LAB — PROTIME-INR
INR: 0.97 (ref 0.00–1.49)
Prothrombin Time: 13.1 seconds (ref 11.6–15.2)

## 2010-09-10 LAB — APTT: aPTT: 36 seconds (ref 24–37)

## 2010-09-10 LAB — CK TOTAL AND CKMB (NOT AT ARMC): Total CK: 135 U/L (ref 7–177)

## 2010-09-11 ENCOUNTER — Inpatient Hospital Stay (HOSPITAL_COMMUNITY): Payer: Medicare Other

## 2010-09-11 DIAGNOSIS — I517 Cardiomegaly: Secondary | ICD-10-CM

## 2010-09-11 LAB — COMPREHENSIVE METABOLIC PANEL
Albumin: 2.8 g/dL — ABNORMAL LOW (ref 3.5–5.2)
BUN: 3 mg/dL — ABNORMAL LOW (ref 6–23)
CO2: 24 mEq/L (ref 19–32)
Chloride: 91 mEq/L — ABNORMAL LOW (ref 96–112)
Creatinine, Ser: <0.47 mg/dL — ABNORMAL LOW (ref 0.50–1.10)
Glucose, Bld: 93 mg/dL (ref 70–99)
Total Bilirubin: 0.2 mg/dL — ABNORMAL LOW (ref 0.3–1.2)

## 2010-09-11 LAB — CBC
MCHC: 34.4 g/dL (ref 30.0–36.0)
MCV: 84.3 fL (ref 78.0–100.0)
Platelets: 208 10*3/uL (ref 150–400)
RDW: 15.1 % (ref 11.5–15.5)
WBC: 14.7 10*3/uL — ABNORMAL HIGH (ref 4.0–10.5)

## 2010-09-11 LAB — POCT ACTIVATED CLOTTING TIME: Activated Clotting Time: 100 seconds

## 2010-09-11 LAB — LIPID PANEL
Cholesterol: 152 mg/dL (ref 0–200)
Triglycerides: 72 mg/dL (ref <150)

## 2010-09-11 LAB — CARDIAC PANEL(CRET KIN+CKTOT+MB+TROPI)
CK, MB: 4.7 ng/mL — ABNORMAL HIGH (ref 0.3–4.0)
Relative Index: INVALID (ref 0.0–2.5)
Troponin I: 1.07 ng/mL (ref <0.30)
Troponin I: 0.54 ng/mL (ref <0.30)

## 2010-09-12 ENCOUNTER — Ambulatory Visit: Payer: Medicare Other | Admitting: Cardiology

## 2010-09-12 ENCOUNTER — Encounter: Payer: Self-pay | Admitting: *Deleted

## 2010-09-12 DIAGNOSIS — S71009A Unspecified open wound, unspecified hip, initial encounter: Secondary | ICD-10-CM

## 2010-09-12 DIAGNOSIS — S71109A Unspecified open wound, unspecified thigh, initial encounter: Secondary | ICD-10-CM

## 2010-09-12 LAB — BASIC METABOLIC PANEL
Chloride: 93 mEq/L — ABNORMAL LOW (ref 96–112)
GFR calc Af Amer: >60 mL/min (ref >60)
GFR calc non Af Amer: >60 mL/min (ref >60)
Potassium: 4.1 mEq/L (ref 3.5–5.1)

## 2010-09-12 LAB — CBC
MCHC: 34.5 g/dL (ref 30.0–36.0)
Platelets: 202 10*3/uL (ref 150–400)
RDW: 15.4 % (ref 11.5–15.5)
WBC: 8.7 10*3/uL (ref 4.0–10.5)

## 2010-09-12 LAB — SEDIMENTATION RATE: Sed Rate: 30 mm/hr — ABNORMAL HIGH (ref 0–22)

## 2010-09-13 LAB — CBC
HCT: 35.1 % — ABNORMAL LOW (ref 36.0–46.0)
Hemoglobin: 12.2 g/dL (ref 12.0–15.0)
MCH: 29.5 pg (ref 26.0–34.0)
MCHC: 34.8 g/dL (ref 30.0–36.0)
MCV: 84.8 fL (ref 78.0–100.0)
Platelets: 212 10*3/uL (ref 150–400)
RBC: 4.14 MIL/uL (ref 3.87–5.11)
RDW: 15.2 % (ref 11.5–15.5)
WBC: 8.1 10*3/uL (ref 4.0–10.5)

## 2010-09-14 DIAGNOSIS — I2510 Atherosclerotic heart disease of native coronary artery without angina pectoris: Secondary | ICD-10-CM

## 2010-09-14 LAB — CBC
HCT: 35.4 % — ABNORMAL LOW (ref 36.0–46.0)
Hemoglobin: 12.5 g/dL (ref 12.0–15.0)
MCHC: 35.3 g/dL (ref 30.0–36.0)
RDW: 14.8 % (ref 11.5–15.5)
WBC: 6.3 10*3/uL (ref 4.0–10.5)

## 2010-09-18 ENCOUNTER — Telehealth: Payer: Self-pay | Admitting: Cardiology

## 2010-09-18 NOTE — Telephone Encounter (Signed)
Pt said home health is coming out to her house to clean wound on her leg and she thinks they are not cleaning it well She wanted to talk to you

## 2010-09-18 NOTE — Telephone Encounter (Signed)
States home care nurse saw her yest and redressed wd on leg. (area that had tape on skin that was pulled off and took skin w/it). States she was told at hospital that she needed to drain blood out of it. They nurse didn't do that yest but did today. She just wanted to know if that was right. Advised that was set up by hospital. Lawson Fiscal will be seeing her next week and we look at area then. She seemed to understand that nurse is to dress and drain blood. Advised if that was what was written on d/c summary they should be doing as instructed.

## 2010-09-19 ENCOUNTER — Emergency Department (HOSPITAL_COMMUNITY)
Admission: EM | Admit: 2010-09-19 | Discharge: 2010-09-19 | Disposition: A | Discharge status: Home / Self Care | Payer: Medicare Other | Attending: Emergency Medicine | Admitting: Emergency Medicine

## 2010-09-19 DIAGNOSIS — IMO0002 Reserved for concepts with insufficient information to code with codable children: Secondary | ICD-10-CM | POA: Insufficient documentation

## 2010-09-19 DIAGNOSIS — Y92009 Unspecified place in unspecified non-institutional (private) residence as the place of occurrence of the external cause: Secondary | ICD-10-CM | POA: Insufficient documentation

## 2010-09-19 DIAGNOSIS — Z7982 Long term (current) use of aspirin: Secondary | ICD-10-CM | POA: Insufficient documentation

## 2010-09-19 DIAGNOSIS — S51809A Unspecified open wound of unspecified forearm, initial encounter: Secondary | ICD-10-CM | POA: Insufficient documentation

## 2010-09-19 DIAGNOSIS — K219 Gastro-esophageal reflux disease without esophagitis: Secondary | ICD-10-CM | POA: Insufficient documentation

## 2010-09-19 DIAGNOSIS — Z79899 Other long term (current) drug therapy: Secondary | ICD-10-CM | POA: Insufficient documentation

## 2010-09-19 DIAGNOSIS — E039 Hypothyroidism, unspecified: Secondary | ICD-10-CM | POA: Insufficient documentation

## 2010-09-19 DIAGNOSIS — R296 Repeated falls: Secondary | ICD-10-CM | POA: Insufficient documentation

## 2010-09-19 DIAGNOSIS — S1190XA Unspecified open wound of unspecified part of neck, initial encounter: Secondary | ICD-10-CM | POA: Insufficient documentation

## 2010-09-19 DIAGNOSIS — M1990 Unspecified osteoarthritis, unspecified site: Secondary | ICD-10-CM | POA: Insufficient documentation

## 2010-09-19 DIAGNOSIS — K5090 Crohn's disease, unspecified, without complications: Secondary | ICD-10-CM | POA: Insufficient documentation

## 2010-09-19 NOTE — Cardiovascular Report (Signed)
  Charlene Vincent, Charlene NO.:  [id number]  MEDICAL RECORD NO.:  [id number]  LOCATION:  2923                         FACILITY:  [facility]  PHYSICIAN:  Zymier Rodgers M. [place], M.D.  DATE OF BIRTH:  05/03/1934  DATE OF PROCEDURE:  09/11/2010 DATE OF DISCHARGE:                           CARDIAC CATHETERIZATION   INDICATIONS FOR PROCEDURE:  A 76 year old white female has a history of longstanding tobacco abuse who presents with symptoms of refractory chest pain and has positive cardiac troponins.  ECG showed T-wave inversion in the anterolateral leads.  PROCEDURE:  Left heart catheterization, coronary and left ventricular angiography.  ACCESS:  Via the right femoral artery using standard Seldinger technique.  EQUIPMENT:  A 5-French 4 cm right and left Judkins catheter, 5-French pigtail catheter, 5-French arterial sheath.  MEDICATIONS:  Local anesthesia with 1% Xylocaine, contrast 90 mL of Omnipaque.  HEMODYNAMIC DATA:  Aortic pressure is 97/54 with mean of 72 mmHg.  Left ventricle pressure is 101 over with EDP of 16 mmHg.  ANGIOGRAPHIC DATA:  That left coronary artery arises and distributes normally.  The left main coronary artery is normal.  The left anterior descending artery has 20% plaque involving the proximal midvessel and extending into the first diagonal branch.  Left circumflex coronary artery appears normal.  The right coronary artery is a dominant vessel.  It has mild calcified plaque in the midvessel with less than or equal to 10% stenosis.  Left ventricular angiography performed in the RAO view demonstrates severe hypokinesis of the mid anterior wall and mid inferior wall with relative sparing of the apex.  Overall ejection fraction is 40%.  FINAL INTERPRETATION: 1. No significant obstructive atherosclerotic coronary artery disease.     The patient has minor nonobstructive disease. 2. Moderate left ventricular dysfunction with wall motion  abnormalities involving the mid left ventricular chamber.  PLAN:  We will continue medical therapy with beta-blockers, nitrates, and aspirin.  We will obtain an echocardiogram.  We will check inflammatory markers.  It is possible this could be a variant of Takotsubo cardiomyopathy.          ______________________________ Calandria Mullings M. [place], M.D.     PMJ/MEDQ  D:  09/11/2010  T:  09/11/2010  Job:  [id number]  cc:   Arturo Morton. Riley Kill, MD, [facility] Mady Gemma, PA  Electronically Signed by Antonina Deziel [place] M.D. on 09/19/2010 08:50:33 AM

## 2010-09-22 ENCOUNTER — Telehealth: Payer: Self-pay | Admitting: Cardiology

## 2010-09-22 NOTE — Discharge Summary (Signed)
NAMEYEHUDIS, MONCEAUX NO.:  [id number]  MEDICAL RECORD NO.:  [id number]  LOCATION:  2020                         FACILITY:  [facility]  PHYSICIAN:  Vesta Mixer, M.D. DATE OF BIRTH:  Jun 29, 1934  DATE OF ADMISSION:  09/10/2010 DATE OF DISCHARGE:  09/14/2010                              DISCHARGE SUMMARY   DISCHARGE DIAGNOSES: 1. Minimal coronary artery disease. 2. Congestive heart failure - thought to be due to takotsubo like     syndrome. 3. Significant skin tear at the catheterization site associated with     pulling of the tape. 4. History of cigarette smoking, anemia - hypothyroidism - ongoing     cigarette abuse.  DISCHARGE MEDICATIONS: 1. Aspirin 81 mg a day. 2. Lisinopril 5 mg a day. 3. Metoprolol 25 mg tablets - one-half tablet twice a day. 4. Nitroglycerin 0.4 mg sublingually as needed for chest pain. 5. Crestor 40 mg a day. 6. Calcium over-the-counter once a day. 7. Iron sulfate 325 mg three times a day. 8. Fluticasone nasal spray once a day. 9. Synthroid 50 mcg a day. 10.Vitamin D once a day. 11.Zyrtec 10 mg a day.  DISPOSITION:  The patient will follow up with Dr. Swaziland in the next week or two.  She is to follow up with her general medical doctor as needed.  HISTORY:  Charlene Vincent is a 76 year old female with a history of chest pain.  She was scheduled to see Dr. Swaziland later this week, but presented to the hospital with episodes of chest pain.  She was admitted to the hospital and had mildly positive cardiac enzymes.  Please see H and P for further details.  HOSPITAL COURSE BY PROBLEMS: 1. Chest pain.  The patient's cardiac enzymes were initially mildly     elevated.  Her CPK was 135 with 8.7 MBs.  Her troponin was 1.07.     The patient had a cardiac catheterization the following morning.     She was found to have minor coronary artery irregularities.     Specifically, she had a 20% narrowing in the left anterior     descending  artery.  The left circumflex artery was normal.  The     right coronary artery was large and dominant and had perhaps at 10%     stenosis at its midpoint.  Left ventriculogram revealed moderate     left ventricular dysfunction with an ejection fraction around 30-     40%.  It appeared similar to a Takotsubo like syndrome.  Echocardiogram performed later that day confirmed an ejection fraction of 35-40%.  The patient will be discharged on the above-noted medications.  We have added lisinopril and metoprolol.  She will follow up with Dr. Swaziland. It is quite likely that she had a coronary spasm secondary to her cigarette smoking.  She is asymptomatic and is ambulating without any significant problems.  We have encouraged her to stop smoking.  1. Skin tear.  The patient's cardiac catheterization was complicated     by a skin tear when they pulled the tape off.  This resulted in a     full-thickness skin tear.  The patient was seen by [facility]  Surgery.  They concluded that there was no significant surgical     procedure needed and she just needed local treatment.  The patient     was encouraged to just keep a dry bandage on her leg.  She can     shower normally.  She can follow up with her medical doctor and/or     Dr. Swaziland if needed. 2. Anemia.  The patient will continue with her iron sulfate.  This     will be followed up by her general medical doctor. 3. Congestive heart failure.  The patient is on a low-dose metoprolol     and lisinopril.  These can be titrated upward as needed by Dr.     Swaziland. 4. Hypothyroidism - stable.     Vesta Mixer, M.D.     PJN/MEDQ  D:  09/14/2010  T:  09/14/2010  Job:  [id number]  cc:   Peter M. Swaziland, M.D. Arturo Morton. Riley Kill, MD, [facility]  Electronically Signed by Kristeen Miss M.D. on 09/22/2010 12:11:07 PM

## 2010-09-22 NOTE — Telephone Encounter (Signed)
FYI: Advance going out for wound care on right inner thigh appx 10cm.  She also had a fall and has a skin tear on her neck (10cm)  and on her left elbow (2x1 cm).  Also has a large tear on her forearm 6x4 cm.  If you need to you can fax [phone] attn [place]ila branch

## 2010-09-24 ENCOUNTER — Ambulatory Visit (INDEPENDENT_AMBULATORY_CARE_PROVIDER_SITE_OTHER): Payer: Medicare Other | Admitting: Nurse Practitioner

## 2010-09-24 ENCOUNTER — Telehealth: Payer: Self-pay | Admitting: Nurse Practitioner

## 2010-09-24 ENCOUNTER — Encounter: Payer: Self-pay | Admitting: Nurse Practitioner

## 2010-09-24 VITALS — BP 110/56 | HR 56 | Ht 67.0 in | Wt 132.6 lb

## 2010-09-24 DIAGNOSIS — I5181 Takotsubo syndrome: Secondary | ICD-10-CM

## 2010-09-24 DIAGNOSIS — I5020 Unspecified systolic (congestive) heart failure: Secondary | ICD-10-CM

## 2010-09-24 DIAGNOSIS — T148XXA Other injury of unspecified body region, initial encounter: Secondary | ICD-10-CM

## 2010-09-24 DIAGNOSIS — IMO0002 Reserved for concepts with insufficient information to code with codable children: Secondary | ICD-10-CM | POA: Insufficient documentation

## 2010-09-24 LAB — BASIC METABOLIC PANEL
BUN: 3 mg/dL — ABNORMAL LOW (ref 6–23)
CO2: 27 mEq/L (ref 19–32)
Calcium: 8.7 mg/dL (ref 8.4–10.5)
Chloride: 91 mEq/L — ABNORMAL LOW (ref 96–112)
Creatinine, Ser: 0.5 mg/dL (ref 0.4–1.2)
GFR: 125.11 mL/min (ref >60.00)
Glucose, Bld: 89 mg/dL (ref 70–99)
Potassium: 4.2 mEq/L (ref 3.5–5.1)
Sodium: 124 mEq/L — ABNORMAL LOW (ref 135–145)

## 2010-09-24 MED ORDER — LEVOFLOXACIN 250 MG PO TABS: 250.0000 mg | ORAL_TABLET | Freq: Every day | ORAL | Status: DC

## 2010-09-24 NOTE — Assessment & Plan Note (Signed)
She is to continue with her current wound care. I am worried about the tear on her forearm. I have given her a short course of Levaquin.

## 2010-09-24 NOTE — Progress Notes (Signed)
[name] Date of Birth: [birth year]   History of Present Illness: Charlene Vincent is seen back today for a post hospital visit. She is seen for Dr. Swaziland. She had positive enzymes and was cathed about 2 weeks ago. Her EF is 35 to 40%. She had questionable variant of Takotsubo. She is on low dose ACE and beta blocker. She says she feels fine. She says she is not smoking. She did have a significant skin tear from her cath site. It is healing. She fell last Friday and landed on a table. She has more skin tears on her neck and left arm. She is using peroxide and antibiotic ointment. No chest pain. She is back walking but trying to stay out of the heat. She is not dizzy.   Current Outpatient Prescriptions on File Prior to Visit  Medication Sig Dispense Refill  . aspirin 81 MG tablet Take 81 mg by mouth daily.        . cetirizine (ZYRTEC) 10 MG tablet Take 10 mg by mouth daily.        . Cholecalciferol (VITAMIN D PO) Take by mouth daily.        . ferrous sulfate 325 (65 FE) MG tablet Take 325 mg by mouth 3 (three) times daily with meals.        . fluticasone (FLOVENT DISKUS) 50 MCG/BLIST diskus inhaler Inhale 2 puffs into the lungs daily.        Marland Kitchen levothyroxine (SYNTHROID, LEVOTHROID) 50 MCG tablet Take 50 mcg by mouth daily.        Marland Kitchen lisinopril (PRINIVIL,ZESTRIL) 5 MG tablet Take 5 mg by mouth daily.        . metoprolol tartrate (LOPRESSOR) 25 MG tablet Take 25 mg by mouth 2 (two) times daily. Take 1/2 tablet twice a day       . rosuvastatin (CRESTOR) 40 MG tablet Take 40 mg by mouth daily.        Marland Kitchen levofloxacin (LEVAQUIN) 250 MG tablet Take 1 tablet (250 mg total) by mouth daily.  7 tablet  0    Allergies  Allergen Reactions  . Aspirin   . Penicillins     Past Medical History  Diagnosis Date  . CAD (coronary artery disease)     s/p cath July 2012. EF is 35 to 40%. 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 Hypothyroidism   . Anemia   . Costochondritis   . Arthritis   .  Cellulitis   . Tobacco abuse     Past Surgical History  Procedure Date  . Inguinal hernia repair   . Cardiac catheterization September 10, 2010    EF 35 to 40%. No significant CAD. ? takotsubo cardiomyopathy    History  Smoking status  . Former Smoker  . Quit date: 09/07/2010  Smokeless tobacco  . Not on file    History  Alcohol Use  . Yes    History reviewed. No pertinent family history.  Review of Systems: The review of systems is positive for recent fall. No syncope. She says she tripped and fell out of the bed and landed on a table.  All other systems were reviewed and are negative.  Physical Exam: BP 110/56  Pulse 56  Ht 5\' 7"  (1.702 m)  Wt 132 lb 9.6 oz (60.147 kg)  BMI 20.77 kg/m2 Patient is pleasant and in no acute distress. Skin is warm and dry. Color is normal. She is ecchymotic.  HEENT is unremarkable.  Normocephalic/atraumatic. PERRL. Sclera are nonicteric. Neck is supple. She has a dressing on the left side. There is a considerable skin tear noted but looks like it is healing.  No masses. No JVD. Lungs are clear. Cardiac exam shows a regular rate and rhythm. Abdomen is soft. Extremities are without edema. Left forearm dressing showed a very nasty skin tear and it looks infected. Her right groin shows a very large skin tear but it is healing nicely. Gait and ROM are intact. No gross neurologic deficits noted.  LABORATORY DATA: BMET is pending   Assessment / Plan:

## 2010-09-24 NOTE — Telephone Encounter (Signed)
Pt was calling back with meds she was missing

## 2010-09-24 NOTE — Telephone Encounter (Signed)
Called stating she was given Rx for Levaquin 500 mg but the paper from our office said 250 mg. Wants to know what to do. Advised to cut pill in 1/2 and take 1/2 every day x 7 days. Will have some left over. She verbalizes understanding.

## 2010-09-24 NOTE — Patient Instructions (Signed)
Keep doing your wound care with the hydrogen peroxide and rinsing with water I am going to give you a short course of antibiotics for the next week. That prescription is at the drug store We are going to check some labs today I will see you in a month. Stay out of the heat.

## 2010-09-24 NOTE — Assessment & Plan Note (Signed)
Her EF is 35 to 40%. She is on low dose ACE and beta blocker. I don't think her blood pressure will allow any titration of her medicines. She says her fall was not due to dizziness but I am still concerned. She will continue with her current therapy. Will need repeat echo in a couple of months. I will see her in a month. Patient is agreeable to this plan and will call if any problems develop in the interim.

## 2010-09-26 ENCOUNTER — Telehealth: Payer: Self-pay | Admitting: *Deleted

## 2010-09-26 NOTE — Telephone Encounter (Signed)
Message copied by Eugenia Pancoast on Fri Sep 26, 2010  4:48 PM ------      Message from: Rosalio Macadamia      Created: Wed Sep 24, 2010  4:43 PM       Ok to report. Continues to have low sodium levels. Discussed with Dr. Swaziland. Needs to follow up with her primary care.

## 2010-09-26 NOTE — Telephone Encounter (Signed)
Advised patient of labs 

## 2010-09-30 ENCOUNTER — Telehealth: Payer: Self-pay | Admitting: Cardiology

## 2010-09-30 NOTE — Telephone Encounter (Signed)
Pt went to PCP this morning stating she exercises every day.  Pt has questions about doing exercises after having a heart attack.  Please call pt back

## 2010-09-30 NOTE — Telephone Encounter (Signed)
Pt wants to know if it will be ok to do exercises at home until she goes on Sept 6 to hosp

## 2010-09-30 NOTE — Telephone Encounter (Signed)
Called wanting to know if she should do cardiac rehab. Was told by PCP that she needed to check w/ [place] before doing "exercises". Advised the hospital would have set her up for rehab and that she should do the program.

## 2010-10-01 ENCOUNTER — Telehealth: Payer: Self-pay | Admitting: Cardiology

## 2010-10-01 NOTE — Telephone Encounter (Signed)
Called wanting to know if can exercise at home before she starts cardiac rehab on 9/6. Advised she can do light activity.

## 2010-10-01 NOTE — Telephone Encounter (Signed)
Please call pt back regarding exercises today, pt has questions

## 2010-10-09 ENCOUNTER — Telehealth: Payer: Self-pay | Admitting: Cardiology

## 2010-10-09 NOTE — Telephone Encounter (Signed)
Called because she wanted to know if she could just take the whole pill of Metoprolol 25mg  once a day because she forgets to take the other half of the pill when she has to take it twice a day. Please call back.

## 2010-10-09 NOTE — H&P (Signed)
NAMEANNAKATE, Charlene Vincent NO.:  [id number]  MEDICAL RECORD NO.:  [id number]  LOCATION:  2923                         FACILITY:  [facility]  PHYSICIAN:  Arturo Morton. Riley Kill, MD, FACCDATE OF BIRTH:  07-11-1934  DATE OF ADMISSION:  09/10/2010 DATE OF DISCHARGE:                             HISTORY & PHYSICAL   PRIMARY CARDIOLOGIST:  The patient was scheduled to see Peter M. Swaziland, MD later this week.  PRIMARY CARE PROVIDER:  Ellard Artis, PA, [facility].  PATIENT PROFILE:  A 76 year old female without prior cardiac history who presented to the ED today after being seen by her primary care provider with complaints of back, jaw, ear, and throat discomfort and has been found to have elevated troponin 12.  PROBLEM LIST: 1. Non-ST-segment elevation myocardial infarction. 2. History of atypical chest pain and costochondritis. 3. Osteoarthritis. 4. Allergic rhinitis. 5. Anemia. 6. Hypothyroidism. 7. Venous insufficiency. 8. Ongoing tobacco abuse.     a.     The patient has smoked for 55 years but never more than half      a pack a day and currently smokes only half a cigarette a day. 9. Status post left inguinal hernia repair in December 2010.  ALLERGIES:  ASPIRIN, PENICILLIN.  HISTORY OF PRESENT ILLNESS:  A 76 year old female without prior cardiac history who was in her usual state of health until the last Friday, September 05, 2010, when she noted intermittent chest pain in her left breast without associated symptoms.  She saw her primary care provider that day with an ECG being performed, showing T-wave inversion in aVL, V1, and V2.  The patient was subsequently set up to see Dr. Swaziland this Friday, September 12, 2010.  The patient had some chest discomfort intermittently throughout the day on September 05, 2010, and also September 06, 2010, but this subsequently resolved.  She continued to exercise over the past few days without any recurrent symptoms or  limitations.  Today, however, the patient noted mid scapular/back pain along with jaw, ear, and throat discomfort.  She again saw her primary care provider and decision was made to send the patient to the ED.  EMS was called and the patient was given aspirin with some reduction in chest discomfort. Here, she is currently comfortable although her troponin is elevated at 0.76.  HOME MEDICATIONS: 1. Flonase 50 mcg 2 sprays each nostril b.i.d. 2. Synthroid 50 mcg daily. 3. Zyrtec 10 mg daily. 4. Ferrous sulfate 324 mg t.i.d.  FAMILY HISTORY:  Mother is alive and well at 75.  Father died at 52 of unknown cause.  She had total of 11 siblings, seven are still living. No history of CAD or stroke as far as she knows.  SOCIAL HISTORY:  The patient lives in [place] with [place] [address] address with her husband.  She is retired.  The patient has been smoking for 55 years but never more than a half pack a day and currently smokes half a cigarette a day.  She denies alcohol or drug use.  She is active at home and exercises several times daily and also walks regularly.  REVIEW OF SYSTEMS:  Positive for chest discomfort last week as well as jaw  pain, throat pain, back pain today.  She has had long history of dark stools related to iron therapy.  She is a full code.  Otherwise, all systems reviewed and negative.  PHYSICAL EXAMINATION:  VITAL SIGNS:  Temperature 98.1, heart rate 72, respirations 16, blood pressure 132/65, pulse ox 98% on 2 liters. GENERAL:  Pleasant white female, in no acute distress.  Awake, alert, and oriented x3.  She has normal affect. HEENT: Normal. NEUROLOGIC:  Notable for resting tremor. NECK:  Supple without bruits or JVD. LUNGS:  Respirations are regular and unlabored.  Clear to auscultation. CARDIAC:  Regular S1 and S1.  No S3, S4, or murmurs. ABDOMEN:  Round, soft, nontender, and nondistended.  Bowel sounds present x4. EXTREMITIES:  Warm, dry, pink.  No clubbing,  cyanosis, or edema. Dorsalis pedis +2, tibial pulses 2+ and equal bilaterally.  She has a normal Allen test.  Chest x-ray shows bibasilar increased markings, most notable medially, may represent atelectasis, although infectious infiltrate not excluded. Recommendation for followup until clear.  EKG shows sinus rhythm at a rate of 74, left axis, T-wave inversion in aVL, V1, and V2.  Hemoglobin 13.8, hematocrit 39.4, WBC 15.0, platelets 230.  Sodium 126, potassium 3.8, chloride 88, CO2 26, BUN less than 3, creatinine 0.56, glucose 136. Troponin 0.76, INR 0.97.  ASSESSMENT AND PLAN: 1. Non-ST-segment elevation myocardial infarction.  The patient has     chest pain last week and back, jaw, and throat pain today, reduced     with aspirin.  Troponin is elevated at 0.76.  Plan to admit and     cycle enzymes.  Discussed cath and the patient is willing to     proceed.  The patient was reported aspirin allergy but tolerated     aspirin given by EMS.  We will forego aspirin and load with Plavix     tonight and also add beta-blocker and statin.  Catheterization in     a.m. 2. Hypothyroidism.  Continue Synthroid.  Check TSH. 3. Allergic rhinitis.  Continue home meds. 4. Abnormal chest x-ray.  Followup PA and lateral chest x-ray in the     a.m.     Nicolasa Ducking, ANP   ______________________________ Arturo Morton. Riley Kill, MD, [facility]    CB/MEDQ  D:  09/10/2010  T:  09/11/2010  Job:  [id number]  Electronically Signed by Nicolasa Ducking ANP on 09/15/2010 10:40:17 AM Electronically Signed by Shawnie Pons MD [facility] on 10/09/2010 09:49:29 AM

## 2010-10-09 NOTE — Telephone Encounter (Signed)
Called wanting to know if she could take Metoprolol in mornings. She takes Metoprolol 25 mg  1/2 tablet BID. States she forgets to take the other 1/2 at night. Advised will speak w/Lori tomorrow and call her back

## 2010-10-10 ENCOUNTER — Telehealth: Payer: Self-pay | Admitting: *Deleted

## 2010-10-10 NOTE — Telephone Encounter (Signed)
Follow up call from yesterday. Lawson Fiscal states we could put her on Metoprolol succ 25 mg but will be more expensive. She states she will try to remember to take the other 1/2 pill at dinner time. Advised to put it with her tooth brush to take at bedtime or dinner would be fine.

## 2010-10-28 ENCOUNTER — Ambulatory Visit: Payer: Medicare Other | Admitting: Nurse Practitioner

## 2010-10-28 ENCOUNTER — Other Ambulatory Visit: Payer: Medicare Other | Admitting: *Deleted

## 2010-10-30 ENCOUNTER — Encounter (HOSPITAL_COMMUNITY): Payer: Self-pay

## 2010-10-30 ENCOUNTER — Encounter (HOSPITAL_COMMUNITY)
Admission: RE | Admit: 2010-10-30 | Discharge: 2010-10-30 | Disposition: A | Discharge status: Home / Self Care | Payer: Medicare Other | Source: Ambulatory Visit | Attending: Cardiology | Admitting: Cardiology

## 2010-10-30 NOTE — Patient Instructions (Signed)
During orientation advised patient on arrival and appointment times what to wear, what to do before, during and after exercise. Reviewed attendance and class policy. Talked about inclement weather and class consultation policy.   

## 2010-10-30 NOTE — Progress Notes (Signed)
Orientation completed. Pt is scheduled to start on 11/03/10 at 8:15. Pt is registered and pt records have been retrieved. Pt is eager to get started. Pt insurance has been verified.

## 2010-11-17 ENCOUNTER — Encounter: Payer: Self-pay | Admitting: Nurse Practitioner

## 2010-11-17 ENCOUNTER — Other Ambulatory Visit: Payer: Medicare Other | Admitting: *Deleted

## 2010-11-17 ENCOUNTER — Ambulatory Visit (INDEPENDENT_AMBULATORY_CARE_PROVIDER_SITE_OTHER): Payer: Medicare Other | Admitting: Nurse Practitioner

## 2010-11-17 DIAGNOSIS — I5181 Takotsubo syndrome: Secondary | ICD-10-CM

## 2010-11-17 NOTE — Patient Instructions (Signed)
We are going to repeat your ultrasound of your heart in about 2 months. You will see Dr. Swaziland afterwards Stay on your same medicines Continue with your exercise program Call for any problems.

## 2010-11-17 NOTE — Progress Notes (Signed)
Charlene Vincent Date of Birth: 1934-02-27   History of Present Illness: Charlene Vincent is seen today for a follow up visit. She is seen for Dr. Sw[place]iland. She is doing very well. She has no complaints. She is not lightheaded or dizzy. No chest pain. No shortness of breath or swelling. She is very active and walking each day. Her skin tears have healed. She feels good on her medicines.   Current Outpatient Prescriptions on File Prior to Visit  Medication Sig Dispense Refill  . aspirin 81 MG tablet Take 81 mg by mouth daily.        . cetirizine (ZYRTEC) 10 MG tablet Take 10 mg by mouth daily.        . Cholecalciferol (VITAMIN D PO) Take by mouth daily.        . clotrimazole-betamethasone (LOTRISONE) cream       . ferrous sulfate 325 (65 FE) MG tablet Take 325 mg by mouth 3 (three) times daily with meals.        . fluticasone (FLONASE) 50 MCG/ACT nasal spray       . fluticasone (FLOVENT DISKUS) 50 MCG/BLIST diskus inhaler Inhale 2 puffs into the lungs daily.        Marland Kitchen levothyroxine (SYNTHROID, LEVOTHROID) 50 MCG tablet Take 50 mcg by mouth daily.        Marland Kitchen lisinopril (PRINIVIL,ZESTRIL) 5 MG tablet Take 5 mg by mouth daily.        Marland Kitchen NITROSTAT 0.4 MG SL tablet       . levofloxacin (LEVAQUIN) 250 MG tablet Take 1 tablet (250 mg total) by mouth daily.  7 tablet  0  . metoprolol tartrate (LOPRESSOR) 25 MG tablet Take 25 mg by mouth 2 (two) times daily. Take 1/2 tablet twice a day       . rosuvastatin (CRESTOR) 40 MG tablet Take 40 mg by mouth daily.          Allergies  Allergen Reactions  . Aspirin     But taking low dose therapy without problems.  . Penicillins     Past Medical History  Diagnosis Date  . CAD (coronary artery disease)     s/p cath July 2012. EF is 35 to 40%. 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 Hypothyroidism   . Anemia   . Costochondritis   . Arthritis   . Cellulitis   . Tobacco abuse     Past Surgical History  Procedure Date  . Inguinal hernia repair     . Cardiac catheterization September 10, 2010    EF 35 to 40%. No significant CAD. ? takotsubo cardiomyopathy    History  Smoking status  . Former Smoker -- 0.5 packs/day  . Types: Cigarettes  . Quit date: 09/07/2010  Smokeless tobacco  . Not on file    History  Alcohol Use  . Yes    Family History  Problem Relation Age of Onset  . Heart disease Father   . Heart disease Brother     Review of Systems: The review of systems is as above.  All other systems were reviewed and are negative.  Physical Exam: BP 120/70  Pulse 64  Ht 5\' 7"  (1.702 m)  Wt 134 lb (60.782 kg)  BMI 20.99 kg/m2 Patient is very pleasant and in no acute distress. Skin is warm and dry. Color is normal.  HEENT is unremarkable. Normocephalic/atraumatic. PERRL. Sclera are nonicteric. Neck is supple. No masses. No JVD. Lungs are clear.  Cardiac exam shows a regular rate and rhythm. No S3.  Abdomen is soft. Extremities are without edema. Gait and ROM are intact. No gross neurologic deficits noted.   LABORATORY DATA:   Assessment / Plan:

## 2010-11-17 NOTE — Assessment & Plan Note (Signed)
She is doing very well clinically. I have left her on her current regimen. We will check an echo in 2 months and I will have her see Dr. Swaziland following the study to review. Patient is agreeable to this plan and will call if any problems develop in the interim.

## 2010-11-21 ENCOUNTER — Other Ambulatory Visit (HOSPITAL_COMMUNITY): Payer: Medicare Other | Admitting: Radiology

## 2011-01-19 ENCOUNTER — Ambulatory Visit: Payer: Medicare Other | Admitting: Cardiology

## 2011-01-19 ENCOUNTER — Ambulatory Visit (HOSPITAL_COMMUNITY): Payer: Medicare Other | Attending: Cardiology | Admitting: Radiology

## 2011-01-19 DIAGNOSIS — I059 Rheumatic mitral valve disease, unspecified: Secondary | ICD-10-CM | POA: Insufficient documentation

## 2011-01-19 DIAGNOSIS — I5181 Takotsubo syndrome: Secondary | ICD-10-CM | POA: Insufficient documentation

## 2011-01-19 DIAGNOSIS — I517 Cardiomegaly: Secondary | ICD-10-CM

## 2011-01-19 DIAGNOSIS — I079 Rheumatic tricuspid valve disease, unspecified: Secondary | ICD-10-CM | POA: Insufficient documentation

## 2011-01-21 ENCOUNTER — Ambulatory Visit (INDEPENDENT_AMBULATORY_CARE_PROVIDER_SITE_OTHER): Payer: Medicare Other | Admitting: Cardiology

## 2011-01-21 ENCOUNTER — Encounter: Payer: Self-pay | Admitting: Cardiology

## 2011-01-21 VITALS — BP 124/70 | HR 72 | Ht 67.0 in | Wt 133.8 lb

## 2011-01-21 DIAGNOSIS — I5181 Takotsubo syndrome: Secondary | ICD-10-CM

## 2011-01-21 DIAGNOSIS — E785 Hyperlipidemia, unspecified: Secondary | ICD-10-CM

## 2011-01-21 MED ORDER — ROSUVASTATIN CALCIUM 10 MG PO TABS: 10.0000 mg | ORAL_TABLET | Freq: Every day | ORAL | Status: DC

## 2011-01-21 NOTE — Progress Notes (Signed)
Charlene Vincent Date of Birth: [birth year]   History of Present Illness: Charlene Vincent is seen back today for a followup visit. She has takotsubo-cardiomyopathy. She has been feeling very well. She denies any recurrent chest pain, shortness of breath, edema, or palpitations. She has returned to normal activity. She is no longer smoking.  Current Outpatient Prescriptions on File Prior to Visit  Medication Sig Dispense Refill  . aspirin 81 MG tablet Take 81 mg by mouth daily.        . cetirizine (ZYRTEC) 10 MG tablet Take 10 mg by mouth daily.        . Cholecalciferol (VITAMIN D PO) Take by mouth daily.        . clotrimazole-betamethasone (LOTRISONE) cream       . ferrous sulfate 325 (65 FE) MG tablet Take 325 mg by mouth 3 (three) times daily with meals.        . fluticasone (FLONASE) 50 MCG/ACT nasal spray       . fluticasone (FLOVENT DISKUS) 50 MCG/BLIST diskus inhaler Inhale 2 puffs into the lungs daily.        Marland Kitchen levothyroxine (SYNTHROID, LEVOTHROID) 50 MCG tablet Take 50 mcg by mouth daily.        Marland Kitchen lisinopril (PRINIVIL,ZESTRIL) 5 MG tablet Take 5 mg by mouth daily.        . metoprolol tartrate (LOPRESSOR) 25 MG tablet Take 25 mg by mouth 2 (two) times daily. Take 1/2 tablet twice a day       . NITROSTAT 0.4 MG SL tablet       . rosuvastatin (CRESTOR) 40 MG tablet Take 40 mg by mouth daily.        Marland Kitchen levofloxacin (LEVAQUIN) 250 MG tablet Take 1 tablet (250 mg total) by mouth daily.  7 tablet  0    Allergies  Allergen Reactions  . Aspirin     But taking low dose therapy without problems.  . Penicillins     Past Medical History  Diagnosis Date  . Takotsubo cardiomyopathy     s/p cath July 2012. EF is 35 to 40%. 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 Hypothyroidism   . Anemia   . Costochondritis   . Arthritis   . Cellulitis   . Tobacco abuse     Past Surgical History  Procedure Date  . Inguinal hernia repair   . Cardiac catheterization September 10, 2010    EF 35 to  40%. No significant CAD. ? takotsubo cardiomyopathy    History  Smoking status  . Former Smoker -- 0.5 packs/day  . Types: Cigarettes  . Quit date: 09/07/2010  Smokeless tobacco  . Not on file    History  Alcohol Use  . Yes    Family History  Problem Relation Age of Onset  . Heart disease Father   . Heart disease Brother     Review of Systems: As noted in history of present illness. All other systems were reviewed and are negative.  Physical Exam: BP 124/70  Pulse 72  Ht 5\' 7"  (1.702 m)  Wt 133 lb 12.8 oz (60.691 kg)  BMI 20.96 kg/m2 Patient is pleasant and in no acute distress. Skin is warm and dry. Color is normal. She is ecchymotic.  HEENT is unremarkable. Normocephalic/atraumatic. PERRL. Sclera are nonicteric.   No masses. No JVD. Lungs are clear. Cardiac exam shows a regular rate and rhythm. Abdomen is soft. Extremities are without edema.  Gait and ROM  are intact. No gross neurologic deficits noted.  LABORATORY DATA: Repeat echocardiogram shows normal left ventricular function now with ejection fraction of 55-60%. There are no significant valvular abnormalities.   Assessment / Plan:

## 2011-01-21 NOTE — Progress Notes (Signed)
Addended by: Meda Klinefelter D on: 01/21/2011 09:33 AM   Modules accepted: Orders

## 2011-01-21 NOTE — Patient Instructions (Signed)
Stop taking lisinopril and metoprolol.  We will reduce Crestor to 10 mg daily.  Congratulations on quitting smoking.  I will see you again in 4 months with fasting lab work.

## 2011-01-21 NOTE — Progress Notes (Signed)
Addended by: Meda Klinefelter D on: 01/21/2011 09:36 AM   Modules accepted: Orders

## 2011-01-21 NOTE — Assessment & Plan Note (Signed)
She was placed on high-dose statin therapy in the hospital. I recommended reducing her Crestor to 10 mg daily. We will check fasting lab work on her next followup in 4 months.

## 2011-01-21 NOTE — Assessment & Plan Note (Signed)
LV function has now recovered completely. She is asymptomatic. We will stop her lisinopril and metoprolol. I'll followup again in 4 months. I have congratulated her on her smoking cessation.

## 2011-03-27 ENCOUNTER — Telehealth: Payer: Self-pay | Admitting: Cardiology

## 2011-03-27 NOTE — Telephone Encounter (Signed)
Pt was told to stay warm and dry and she wants to know if going out in the 20's and 30's is too much for her

## 2011-03-27 NOTE — Telephone Encounter (Signed)
Patient states that  when she was seen by Dr. Sw[place] on 01/21/11, he recommended for pt to stay warm and dry. She would like to know if going out in the cold weather in the 20's and 30's degrees would be too much for her. RN suggested to pt that if she does not need to go any where it will be good for her to stay home. Patient verbalized understanding.

## 2011-04-28 ENCOUNTER — Other Ambulatory Visit (HOSPITAL_COMMUNITY): Payer: Self-pay | Admitting: Family Medicine

## 2011-04-28 DIAGNOSIS — Z1231 Encounter for screening mammogram for malignant neoplasm of breast: Secondary | ICD-10-CM

## 2011-05-21 ENCOUNTER — Encounter: Payer: Self-pay | Admitting: Cardiology

## 2011-05-21 ENCOUNTER — Ambulatory Visit (INDEPENDENT_AMBULATORY_CARE_PROVIDER_SITE_OTHER): Payer: Medicare Other | Admitting: Cardiology

## 2011-05-21 ENCOUNTER — Telehealth: Payer: Self-pay | Admitting: Cardiology

## 2011-05-21 VITALS — BP 130/70 | HR 72 | Ht 67.0 in | Wt 133.0 lb

## 2011-05-21 DIAGNOSIS — E785 Hyperlipidemia, unspecified: Secondary | ICD-10-CM

## 2011-05-21 DIAGNOSIS — I5181 Takotsubo syndrome: Secondary | ICD-10-CM

## 2011-05-21 NOTE — Telephone Encounter (Signed)
Pt was in today, and forgot to ask if she can start going places, said had been told not to go out except to church

## 2011-05-21 NOTE — Assessment & Plan Note (Signed)
She has recovered completely from her cardiomyopathy. Her ejection fraction is now normal. There is a low likelihood of recurrence. She is on no active cardiac medications. I will see her back on a when necessary basis. She is to follow up with her primary physician for management of her cholesterol.

## 2011-05-21 NOTE — Patient Instructions (Signed)
Continue your current therapy  I will see you back as needed.   

## 2011-05-21 NOTE — Progress Notes (Signed)
    Charlene Vincent Date of Birth: Feb 16, 1935   History of Present Illness: Charlene Vincent is seen back today for a followup visit. She has a history of takotsubo-cardiomyopathy. She has been feeling very well. She denies any recurrent chest pain, shortness of breath, edema, or palpitations. She has returned to normal activity. She is no longer smoking.  Current Outpatient Prescriptions on File Prior to Visit  Medication Sig Dispense Refill  . cetirizine (ZYRTEC) 10 MG tablet Take 10 mg by mouth daily.        . Cholecalciferol (VITAMIN D PO) Take by mouth daily.        . ferrous sulfate 325 (65 FE) MG tablet Take 325 mg by mouth 3 (three) times daily with meals.        . fluticasone (FLONASE) 50 MCG/ACT nasal spray       . levothyroxine (SYNTHROID, LEVOTHROID) 50 MCG tablet Take 50 mcg by mouth daily.        Marland Kitchen NITROSTAT 0.4 MG SL tablet       . rosuvastatin (CRESTOR) 10 MG tablet Take 1 tablet (10 mg total) by mouth at bedtime.  30 tablet  11  . levofloxacin (LEVAQUIN) 250 MG tablet Take 1 tablet (250 mg total) by mouth daily.  7 tablet  0    Allergies  Allergen Reactions  . Aspirin     But taking low dose therapy without problems.  . Penicillins     Past Medical History  Diagnosis Date  . Takotsubo cardiomyopathy     s/p cath July 2012. EF is 35 to 40%. 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 Hypothyroidism   . Anemia   . Costochondritis   . Arthritis   . Cellulitis     Past Surgical History  Procedure Date  . Inguinal hernia repair   . Cardiac catheterization September 10, 2010    EF 35 to 40%. No significant CAD. ? takotsubo cardiomyopathy    History  Smoking status  . Former Smoker -- 0.5 packs/day  . Types: Cigarettes  . Quit date: 09/07/2010  Smokeless tobacco  . Not on file    History  Alcohol Use  . Yes    Family History  Problem Relation Age of Onset  . Heart disease Father   . Heart disease Brother     Review of Systems: As noted in history  of present illness. All other systems were reviewed and are negative.  Physical Exam: BP 130/70  Pulse 72  Ht 5\' 7"  (1.702 m)  Wt 133 lb (60.328 kg)  BMI 20.83 kg/m2 Patient is pleasant and in no acute distress. Skin is warm and dry. Color is normal. She is ecchymotic.  HEENT is unremarkable. Normocephalic/atraumatic. PERRL. Sclera are nonicteric.   No masses. No JVD. Lungs are clear. Cardiac exam shows a regular rate and rhythm. Abdomen is soft. Extremities are without edema.  Gait and ROM are intact. No gross neurologic deficits noted.  LABORATORY DATA:    Assessment / Plan:

## 2011-05-21 NOTE — Telephone Encounter (Signed)
Patient called was told spoke with Dr.Jordan he advised no restrictions.

## 2011-05-26 ENCOUNTER — Telehealth: Payer: Self-pay | Admitting: Cardiology

## 2011-05-26 NOTE — Telephone Encounter (Signed)
Pt would like a call from Dr. [place] because when she was in for a visit last she was told by a nurse or someone who saw her that she needs to get out and exercise more and put her on a new medication but her pcp told her she needs rest and not to get out a lot so she needs to know what to do

## 2011-05-26 NOTE — Telephone Encounter (Signed)
Patient called back was told Dr.Jordan said she had no restrictions.She may do activity as tolerated.

## 2011-05-27 ENCOUNTER — Ambulatory Visit (HOSPITAL_COMMUNITY)
Admission: RE | Admit: 2011-05-27 | Discharge: 2011-05-27 | Disposition: A | Discharge status: Home / Self Care | Payer: Medicare Other | Source: Ambulatory Visit | Attending: Family Medicine | Admitting: Family Medicine

## 2011-05-27 DIAGNOSIS — Z1231 Encounter for screening mammogram for malignant neoplasm of breast: Secondary | ICD-10-CM

## 2011-07-17 ENCOUNTER — Encounter (HOSPITAL_COMMUNITY): Payer: Self-pay | Admitting: *Deleted

## 2011-07-17 ENCOUNTER — Inpatient Hospital Stay (HOSPITAL_COMMUNITY)
Admission: EM | Admit: 2011-07-17 | Discharge: 2011-07-20 | DRG: 603 | Disposition: A | Discharge status: Home / Self Care | Payer: Medicare Other | Attending: Internal Medicine | Admitting: Internal Medicine

## 2011-07-17 DIAGNOSIS — D72819 Decreased white blood cell count, unspecified: Secondary | ICD-10-CM | POA: Diagnosis present

## 2011-07-17 DIAGNOSIS — R112 Nausea with vomiting, unspecified: Secondary | ICD-10-CM

## 2011-07-17 DIAGNOSIS — L0231 Cutaneous abscess of buttock: Principal | ICD-10-CM | POA: Diagnosis present

## 2011-07-17 DIAGNOSIS — E871 Hypo-osmolality and hyponatremia: Secondary | ICD-10-CM | POA: Diagnosis present

## 2011-07-17 DIAGNOSIS — I10 Essential (primary) hypertension: Secondary | ICD-10-CM

## 2011-07-17 DIAGNOSIS — Z87891 Personal history of nicotine dependence: Secondary | ICD-10-CM

## 2011-07-17 DIAGNOSIS — D509 Iron deficiency anemia, unspecified: Secondary | ICD-10-CM | POA: Diagnosis present

## 2011-07-17 DIAGNOSIS — I5181 Takotsubo syndrome: Secondary | ICD-10-CM | POA: Diagnosis present

## 2011-07-17 DIAGNOSIS — L03317 Cellulitis of buttock: Secondary | ICD-10-CM

## 2011-07-17 DIAGNOSIS — E032 Hypothyroidism due to medicaments and other exogenous substances: Secondary | ICD-10-CM

## 2011-07-17 DIAGNOSIS — E785 Hyperlipidemia, unspecified: Secondary | ICD-10-CM | POA: Diagnosis present

## 2011-07-17 DIAGNOSIS — D649 Anemia, unspecified: Secondary | ICD-10-CM | POA: Diagnosis present

## 2011-07-17 DIAGNOSIS — L0390 Cellulitis, unspecified: Secondary | ICD-10-CM

## 2011-07-17 DIAGNOSIS — IMO0002 Reserved for concepts with insufficient information to code with codable children: Secondary | ICD-10-CM | POA: Diagnosis present

## 2011-07-17 DIAGNOSIS — E039 Hypothyroidism, unspecified: Secondary | ICD-10-CM | POA: Diagnosis present

## 2011-07-17 LAB — BASIC METABOLIC PANEL
BUN: 6 mg/dL (ref 6–23)
CO2: 26 mEq/L (ref 19–32)
Chloride: 92 mEq/L — ABNORMAL LOW (ref 96–112)
Creatinine, Ser: 0.58 mg/dL (ref 0.50–1.10)

## 2011-07-17 LAB — CBC
HCT: 35.6 % — ABNORMAL LOW (ref 36.0–46.0)
MCV: 90.8 fL (ref 78.0–100.0)
RDW: 13.4 % (ref 11.5–15.5)
WBC: 7 10*3/uL (ref 4.0–10.5)

## 2011-07-17 LAB — DIFFERENTIAL
Basophils Absolute: 0 10*3/uL (ref 0.0–0.1)
Eosinophils Relative: 2 % (ref 0–5)
Lymphocytes Relative: 20 % (ref 12–46)
Monocytes Absolute: 1.1 10*3/uL — ABNORMAL HIGH (ref 0.1–1.0)

## 2011-07-17 LAB — APTT: aPTT: 44 seconds — ABNORMAL HIGH (ref 24–37)

## 2011-07-17 LAB — HEPATIC FUNCTION PANEL
ALT: 15 U/L
AST: 22 U/L
Alkaline Phosphatase: 82 U/L
Bilirubin, Direct: <0.1 mg/dL
Total Protein: 6.5 g/dL

## 2011-07-17 LAB — PHOSPHORUS: Phosphorus: 2.7 mg/dL (ref 2.3–4.6)

## 2011-07-17 LAB — MAGNESIUM: Magnesium: 2.2 mg/dL (ref 1.5–2.5)

## 2011-07-17 MED ORDER — DIPHENHYDRAMINE HCL 25 MG PO CAPS
25.0000 mg | ORAL_CAPSULE | Freq: Every day | ORAL | Status: DC
  Administered 2011-07-17 – 2011-07-19 (×3): 25 mg via ORAL
  Filled 2011-07-17 (×6): qty 1

## 2011-07-17 MED ORDER — VANCOMYCIN HCL IN DEXTROSE 1-5 GM/200ML-% IV SOLN
1000.0000 mg | Freq: Once | INTRAVENOUS | Status: AC
  Administered 2011-07-17: 1000 mg via INTRAVENOUS
  Filled 2011-07-17: qty 200

## 2011-07-17 MED ORDER — ACETAMINOPHEN 650 MG RE SUPP: 650.0000 mg | Freq: Four times a day (QID) | RECTAL | Status: DC | PRN

## 2011-07-17 MED ORDER — ATORVASTATIN CALCIUM 20 MG PO TABS
20.0000 mg | ORAL_TABLET | Freq: Every day | ORAL | Status: DC
  Administered 2011-07-17 – 2011-07-19 (×3): 20 mg via ORAL
  Filled 2011-07-17 (×3): qty 1

## 2011-07-17 MED ORDER — VANCOMYCIN HCL 1000 MG IV SOLR
750.0000 mg | Freq: Two times a day (BID) | INTRAVENOUS | Status: DC
  Administered 2011-07-18 – 2011-07-20 (×5): 750 mg via INTRAVENOUS
  Filled 2011-07-17 (×7): qty 750

## 2011-07-17 MED ORDER — SODIUM CHLORIDE 0.9 % IV SOLN: INTRAVENOUS | Status: AC

## 2011-07-17 MED ORDER — LIDOCAINE HCL (PF) 1 % IJ SOLN
10.0000 mL | Freq: Once | INTRAMUSCULAR | Status: AC
  Administered 2011-07-17: 5 mL
  Filled 2011-07-17 (×2): qty 5

## 2011-07-17 MED ORDER — ONDANSETRON HCL 4 MG/2ML IJ SOLN: 4.0000 mg | Freq: Four times a day (QID) | INTRAMUSCULAR | Status: DC | PRN

## 2011-07-17 MED ORDER — ONDANSETRON HCL 4 MG PO TABS: 4.0000 mg | ORAL_TABLET | Freq: Four times a day (QID) | ORAL | Status: DC | PRN

## 2011-07-17 MED ORDER — ADULT MULTIVITAMIN W/MINERALS CH
1.0000 | ORAL_TABLET | Freq: Every day | ORAL | Status: DC
  Administered 2011-07-18 – 2011-07-20 (×3): 1 via ORAL

## 2011-07-17 MED ORDER — DOCUSATE SODIUM 100 MG PO CAPS
100.0000 mg | ORAL_CAPSULE | Freq: Two times a day (BID) | ORAL | Status: DC
  Administered 2011-07-17 – 2011-07-20 (×5): 100 mg via ORAL
  Filled 2011-07-17 (×6): qty 1

## 2011-07-17 MED ORDER — MORPHINE SULFATE 2 MG/ML IJ SOLN: 1.0000 mg | INTRAMUSCULAR | Status: DC | PRN

## 2011-07-17 MED ORDER — HYDROCODONE-ACETAMINOPHEN 5-325 MG PO TABS: 1.0000 | ORAL_TABLET | ORAL | Status: DC | PRN

## 2011-07-17 MED ORDER — ONDANSETRON HCL 4 MG/2ML IJ SOLN: 4.0000 mg | Freq: Three times a day (TID) | INTRAMUSCULAR | Status: AC | PRN

## 2011-07-17 MED ORDER — MORPHINE SULFATE 4 MG/ML IJ SOLN: 4.0000 mg | INTRAMUSCULAR | Status: DC | PRN

## 2011-07-17 MED ORDER — HYDRALAZINE HCL 20 MG/ML IJ SOLN: 10.0000 mg | Freq: Four times a day (QID) | INTRAMUSCULAR | Status: DC | PRN

## 2011-07-17 MED ORDER — FLUTICASONE PROPIONATE 50 MCG/ACT NA SUSP
1.0000 | Freq: Two times a day (BID) | NASAL | Status: DC
  Administered 2011-07-17 – 2011-07-20 (×6): 1 via NASAL
  Filled 2011-07-17: qty 16

## 2011-07-17 MED ORDER — LEVOTHYROXINE SODIUM 50 MCG PO TABS
50.0000 ug | ORAL_TABLET | Freq: Every day | ORAL | Status: DC
  Administered 2011-07-18 – 2011-07-20 (×3): 50 ug via ORAL
  Filled 2011-07-17 (×3): qty 1

## 2011-07-17 MED ORDER — SODIUM CHLORIDE 0.9 % IJ SOLN
INTRAMUSCULAR | Status: AC
  Administered 2011-07-17: 10 mL
  Filled 2011-07-17: qty 3

## 2011-07-17 MED ORDER — ENOXAPARIN SODIUM 40 MG/0.4ML ~~LOC~~ SOLN
40.0000 mg | SUBCUTANEOUS | Status: DC
  Administered 2011-07-17 – 2011-07-19 (×3): 40 mg via SUBCUTANEOUS
  Filled 2011-07-17 (×3): qty 0.4

## 2011-07-17 MED ORDER — ALUM & MAG HYDROXIDE-SIMETH 200-200-20 MG/5ML PO SUSP
30.0000 mL | Freq: Four times a day (QID) | ORAL | Status: DC | PRN
  Administered 2011-07-17 – 2011-07-18 (×2): 30 mL via ORAL
  Filled 2011-07-17 (×2): qty 30

## 2011-07-17 MED ORDER — FERROUS SULFATE 325 (65 FE) MG PO TABS
325.0000 mg | ORAL_TABLET | Freq: Three times a day (TID) | ORAL | Status: DC
Start: 2011-07-18 — End: 2011-07-20
  Administered 2011-07-18 – 2011-07-20 (×8): 325 mg via ORAL
  Filled 2011-07-17 (×9): qty 1

## 2011-07-17 MED ORDER — ENOXAPARIN SODIUM 30 MG/0.3ML ~~LOC~~ SOLN: 30.0000 mg | SUBCUTANEOUS | Status: DC

## 2011-07-17 MED ORDER — CHOLECALCIFEROL 10 MCG (400 UNIT) PO TABS
400.0000 [IU] | ORAL_TABLET | Freq: Every day | ORAL | Status: DC
  Administered 2011-07-18 – 2011-07-19 (×2): 400 [IU] via ORAL
  Filled 2011-07-17 (×4): qty 1

## 2011-07-17 MED ORDER — CIPROFLOXACIN IN D5W 400 MG/200ML IV SOLN
400.0000 mg | Freq: Once | INTRAVENOUS | Status: AC
  Administered 2011-07-17: 400 mg via INTRAVENOUS
  Filled 2011-07-17: qty 200

## 2011-07-17 MED ORDER — ACETAMINOPHEN 325 MG PO TABS
650.0000 mg | ORAL_TABLET | Freq: Four times a day (QID) | ORAL | Status: DC | PRN
  Administered 2011-07-19: 650 mg via ORAL
  Filled 2011-07-17: qty 2

## 2011-07-17 MED ORDER — LORATADINE 10 MG PO TABS
10.0000 mg | ORAL_TABLET | Freq: Every day | ORAL | Status: DC
  Administered 2011-07-18 – 2011-07-20 (×3): 10 mg via ORAL
  Filled 2011-07-17 (×3): qty 1

## 2011-07-17 MED ORDER — CENTRUM SILVER PO TABS: 1.0000 | ORAL_TABLET | Freq: Every day | ORAL | Status: DC

## 2011-07-17 NOTE — ED Provider Notes (Signed)
History     CSN: [id number]  Arrival date & time 07/17/11  1359   First MD Initiated Contact with Patient 07/17/11 1451      Chief Complaint  Patient presents with  . Abscess    (Consider location/radiation/quality/duration/timing/severity/associated sxs/prior treatment) Patient is a 77 y.o. female presenting with abscess. The history is provided by the patient.  Abscess   She has had an infection in her right by Dr. for the last 4 days. She saw her primary care doctor who prescribed doxycycline for her. She's been taking doxycycline, but it is not getting better. Redness is spreading into her thigh. She denies fever, chills, sweats. It is only mildly painful. There has been some slight drainage.  Past Medical History  Diagnosis Date  . Takotsubo cardiomyopathy     s/p cath July 2012. EF is 35 to 40%. 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 Hypothyroidism   . Anemia   . Costochondritis   . Arthritis   . Cellulitis     Past Surgical History  Procedure Date  . Inguinal hernia repair   . Cardiac catheterization September 10, 2010    EF 35 to 40%. No significant CAD. ? takotsubo cardiomyopathy    Family History  Problem Relation Age of Onset  . Heart disease Father   . Heart disease Brother     History  Substance Use Topics  . Smoking status: Former Smoker -- 0.5 packs/day    Types: Cigarettes    Quit date: 09/07/2010  . Smokeless tobacco: Not on file  . Alcohol Use: No    OB History    Grav Para Term Preterm Abortions TAB SAB Ect Mult Living                  Review of Systems  All other systems reviewed and are negative.    Allergies  Aspirin and Penicillins  Home Medications   Current Outpatient Rx  Name Route Sig Dispense Refill  . CETIRIZINE HCL 10 MG PO TABS Oral Take 10 mg by mouth daily.      Marland Kitchen VITAMIN D PO Oral Take by mouth daily.      Marland Kitchen FERROUS SULFATE 325 (65 FE) MG PO TABS Oral Take 325 mg by mouth 3 (three) times daily with meals.       Marland Kitchen FLUTICASONE PROPIONATE 50 MCG/ACT NA SUSP      . LEVOFLOXACIN 250 MG PO TABS Oral Take 1 tablet (250 mg total) by mouth daily. 7 tablet 0  . LEVOTHYROXINE SODIUM 50 MCG PO TABS Oral Take 50 mcg by mouth daily.      . MULTI-VITAMIN PO Oral Take by mouth daily.    Marland Kitchen NITROSTAT 0.4 MG SL SUBL      . ROSUVASTATIN CALCIUM 10 MG PO TABS Oral Take 1 tablet (10 mg total) by mouth at bedtime. 30 tablet 11    BP 113/65  Pulse 86  Temp(Src) 98.3 F (36.8 C) (Oral)  Resp 16  Ht 5\' 7"  (1.702 m)  Wt 134 lb (60.782 kg)  BMI 20.99 kg/m2  SpO2 100%  Physical Exam  Nursing note and vitals reviewed.  77 year old female is resting comfortably and in no acute distress. Vital signs are normal. Oxygen saturation is 100% which is normal. Head is normocephalic and atraumatic. PERRLA, EOMI. Oropharynx is clear. Neck is nontender and supple. Lungs are clear without rales, wheezes, rhonchi. Back is nontender. Heart has regular rate and rhythm without murmur. Abdomen is  soft, flat, nontender without masses or hepatosplenomegaly. There is an abscess present in the right gluteal area but is clearly separated from the perirectal area. Area of cellulitis extends into the posterior right thigh going about one third of the way down the thigh. Extremities have no cyanosis or edema, full range of motion is present. Skin is warm and dry without other rash. Neurologic: Mental status is normal, cranial nerves are intact, there are no gross motor or sensory deficits.  ED Course  INCISION AND DRAINAGE Date/Time: 07/17/2011 4:25 PM Performed by: Dione Booze Authorized by: Preston Fleeting, Charina Fons Consent: Verbal consent obtained. Written consent not obtained. Risks and benefits: risks, benefits and alternatives were discussed Consent given by: patient Patient understanding: patient states understanding of the procedure being performed Patient consent: the patient's understanding of the procedure matches consent given Procedure  consent: procedure consent matches procedure scheduled Relevant documents: relevant documents present and verified Test results: test results available and properly labeled Site marked: the operative site was marked Required items: required blood products, implants, devices, and special equipment available Patient identity confirmed: verbally with patient and arm band Time out: Immediately prior to procedure a "time out" was called to verify the correct patient, procedure, equipment, support staff and site/side marked as required. Type: abscess Location: Right gluteal. Anesthesia: local infiltration Local anesthetic: lidocaine 1% without epinephrine Patient sedated: no Scalpel size: 11 Incision type: single straight (Abscess cavity was probed with a hemostat to break up loculations) Complexity: simple Drainage: purulent Drainage amount: moderate Packing material: 1/4 in iodoform gauze Patient tolerance: Patient tolerated the procedure well with no immediate complications.   (including critical care time)  Results for orders placed during the hospital encounter of 07/17/11  CBC      Component Value Range   WBC 7.0  4.0 - 10.5 (K/uL)   RBC 3.92  3.87 - 5.11 (MIL/uL)   Hemoglobin 12.2  12.0 - 15.0 (g/dL)   HCT 16.1 (*) 09.6 - 46.0 (%)   MCV 90.8  78.0 - 100.0 (fL)   MCH 31.1  26.0 - 34.0 (pg)   MCHC 34.3  30.0 - 36.0 (g/dL)   RDW 04.5  40.9 - 81.1 (%)   Platelets 205  150 - 400 (K/uL)  DIFFERENTIAL      Component Value Range   Neutrophils Relative 63  43 - 77 (%)   Neutro Abs 4.4  1.7 - 7.7 (K/uL)   Lymphocytes Relative 20  12 - 46 (%)   Lymphs Abs 1.4  0.7 - 4.0 (K/uL)   Monocytes Relative 15 (*) 3 - 12 (%)   Monocytes Absolute 1.1 (*) 0.1 - 1.0 (K/uL)   Eosinophils Relative 2  0 - 5 (%)   Eosinophils Absolute 0.1  0.0 - 0.7 (K/uL)   Basophils Relative 0  0 - 1 (%)   Basophils Absolute 0.0  0.0 - 0.1 (K/uL)  BASIC METABOLIC PANEL      Component Value Range   Sodium 127  (*) 135 - 145 (mEq/L)   Potassium 3.8  3.5 - 5.1 (mEq/L)   Chloride 92 (*) 96 - 112 (mEq/L)   CO2 26  19 - 32 (mEq/L)   Glucose, Bld 84  70 - 99 (mg/dL)   BUN 6  6 - 23 (mg/dL)   Creatinine, Ser 9.14  0.50 - 1.10 (mg/dL)   Calcium 9.5  8.4 - 78.2 (mg/dL)   GFR calc non Af Amer 88 (*) >90 (mL/min)   GFR calc Af Amer >90  >  90 (mL/min)     1. Abscess, gluteal, right   2. Cellulitis       MDM  Gluteal abscess with accompanying cellulitis. She will need incision and drainage. Because of the spread of cellulitis in spite of appropriate outpatient treatment, anticipate she will need inpatient care for at least one to 2 days. Initial dose of vancomycin was given.  Case is discussed with Dr. Lendell Caprice who agrees to admit the patient under observation status. She has requested but the patient (ciprofloxacin for gram-negative coverage in addition to vancomycin which will give good gram-positive coverage. Medications list levofloxacin as a home medication, but this apparently is an old prescription and she is not actually taking that. She has shown me her pill bottles of what she takes and there is no levofloxacin.    Dione Booze, MD 07/17/11 [phone]

## 2011-07-17 NOTE — ED Notes (Signed)
Abscess to right buttock x 1 wk.  Reports seen PCP at beginning of week who lanced area.  States area has gotten worse with excessive draining and soreness.

## 2011-07-17 NOTE — H&P (Signed)
PCP:  Pamelia Hoit, MD, MD   DOA:  07/17/2011  2:38 PM  Chief Complaint:  Gluteal abscess  HPI: 77 year old female with history of hypothyroidism, takutsubo cardiomyopathy ( EF in 2012 35%; per EPIC notes recovered and EF 04/2011 normal but no ECHO results from this date available) who presented to ED with complaints of worsening gluteal abscess for past 4 days prior to admission. Abscess initially started in gluteal region but has increased in size to include now posterior right thigh. Patient reports taking doxycycline prescribed for her by PCP but no significant improvement noted. Patient reports no associated fever or chills, no cough, no shortness of breath, no chest pain, no abdominal pain, non nausea and vomiting, no diarrhea and constipation.  Assessment/Plan  Principal Problem:   *Gluteal Cellulitis and Abscess  - we started zosyn and vanco IV per pharmacy - surgery consult appreciated for possible I&D  Active Problems:   Hypothyroidism - continue synthroid 50 mcg daily   Anemia - secondary to chronic disease, cardiomyopathy - continue ferrous sulfate 325 mg TID - follow up am CBC  Hyponatremia - unclear etiology, perhaps due to cardiomyopathy (patient has 2 D ECHO in 2012 with EF of 35%); possibly even due to hypothyroidim - sodium 127 on admission which seems to be around patient baseline as her sodium level in 2012 was 126 - patient was given NS on admission to ED - continue to monitor   HTN (hypertension) - patient is not on any antihypertensives at home - we will add hydralazine PRN only for better BP control  DVT Prophylaxis - lovenox subQ  Code Status - full code  Diet - general diet  Disposition - admit to med-surg - PT evaluation  Education  - test results and diagnostic studies were discussed with patient  at the bedside - patient  verbalized the understanding - questions were answered at the bedside and contact information was provided for  additional questions or concerns  Consults: 1. Surgery 2. Physical Therapy  Antibiotics: 1. Vancomycin 07/17/2011 --> 2. Zosyn 07/17/2011 -->   Allergies: Allergies  Allergen Reactions  . Aspirin     But taking low dose therapy without problems.  . Penicillins Rash    Prior to Admission medications   Medication Sig Start Date End Date Taking? Authorizing Provider  acetaminophen (TYLENOL) 325 MG tablet Take 650 mg by mouth as needed. For pain   Yes Historical Provider, MD  cetirizine (ZYRTEC) 10 MG tablet Take 10 mg by mouth every morning.    Yes Historical Provider, MD  Cholecalciferol (VITAMIN D PO) Take by mouth daily.     Yes Historical Provider, MD  diphenhydrAMINE (BENADRYL) 25 MG tablet Take 25 mg by mouth at bedtime.   Yes Historical Provider, MD  doxycycline (MONODOX) 100 MG capsule Take 100 mg by mouth 2 (two) times daily. For 10 days 07/15/11  Yes Historical Provider, MD  ferrous sulfate 325 (65 FE) MG tablet Take 325 mg by mouth 3 (three) times daily with meals.     Yes Historical Provider, MD  fluticasone (FLONASE) 50 MCG/ACT nasal spray Place 1 spray into the nose 2 (two) times daily.  09/03/10  Yes Historical Provider, MD  levothyroxine (SYNTHROID, LEVOTHROID) 50 MCG tablet Take 50 mcg by mouth every morning.    Yes Historical Provider, MD  Multiple Vitamins-Minerals (CENTRUM SILVER) tablet Take 1 tablet by mouth daily.   Yes Historical Provider, MD  rosuvastatin (CRESTOR) 10 MG tablet Take 1 tablet (10 mg  total) by mouth at bedtime. 01/21/11 01/21/12 Yes Peter M [place], MD  levofloxacin (LEVAQUIN) 250 MG tablet Take 1 tablet (250 mg total) by mouth daily. 09/24/10 10/01/10  Rosalio Macadamia, NP  NITROSTAT 0.4 MG SL tablet Place 0.4 mg under the tongue every 5 (five) minutes as needed.  09/14/10   Historical Provider, MD    Past Medical History  Diagnosis Date  . Takotsubo cardiomyopathy     s/p cath July 2012. EF is 35 to 40%. Thought to be due to takotsubo like syndrome    . Hypothyroidism   . Anemia   . Costochondritis   . Arthritis   . Cellulitis     Past Surgical History  Procedure Date  . Inguinal hernia repair   . Cardiac catheterization September 10, 2010    EF 35 to 40%. No significant CAD. ? takotsubo cardiomyopathy    Social History:  reports that she quit smoking about 10 months ago. Her smoking use included Cigarettes. She smoked .5 packs per day. She does not have any smokeless tobacco history on file. She reports that she does not drink alcohol or use illicit drugs.  Family History  Problem Relation Age of Onset  . Heart disease Father   . Heart disease Brother     Review of Systems:  Constitutional: Denies fever, chills, diaphoresis, appetite change and fatigue.  HEENT: Denies photophobia, eye pain, redness, hearing loss, ear pain, congestion, sore throat, rhinorrhea, sneezing, mouth sores, trouble swallowing, neck pain, neck stiffness and tinnitus.   Respiratory: Denies SOB, DOE, cough, chest tightness,  and wheezing.   Cardiovascular: Denies chest pain, palpitations and leg swelling.  Gastrointestinal: Denies nausea, vomiting, abdominal pain, diarrhea, constipation, blood in stool and abdominal distention.  Genitourinary: Denies dysuria, urgency, frequency, hematuria, flank pain and difficulty urinating.  Musculoskeletal: Denies myalgias, back pain, joint swelling, arthralgias and gait problem.  Skin: per HPI.  Neurological: Denies dizziness, seizures, syncope, weakness, light-headedness, numbness and headaches.  Hematological: Denies adenopathy. Easy bruising, personal or family bleeding history  Psychiatric/Behavioral: Denies suicidal ideation, mood changes, confusion, nervousness, sleep disturbance and agitation   Physical Exam:  Filed Vitals:   07/17/11 1423 07/17/11 1846  BP: 113/65 157/88  Pulse: 86 79  Temp: 98.3 F (36.8 C) 97.9 F (36.6 C)  TempSrc: Oral Oral  Resp: 16 18  Height: 5\' 7"  (1.702 m) 5\' 7"  (1.702 m)   Weight: 60.782 kg (134 lb) 60.1 kg (132 lb 7.9 oz)  SpO2: 100% 97%    Constitutional: Vital signs reviewed.  Patient is in no acute distress and cooperative with exam. Alert and oriented x3.  Head: Normocephalic and atraumatic Ear: TM normal bilaterally Mouth: no erythema or exudates, MMM Eyes: PERRL, EOMI, conjunctivae normal, No scleral icterus.  Neck: Supple, Trachea midline normal ROM, No JVD, mass, thyromegaly, or carotid bruit present.  Cardiovascular: RRR, S1 normal, S2 normal, no MRG, pulses symmetric and intact bilaterally Pulmonary/Chest: CTAB, no wheezes, rales, or rhonchi Abdominal: Soft. Non-tender, non-distended, bowel sounds are normal, no masses, organomegaly, or guarding present.  GU: no CVA tenderness Musculoskeletal: No joint deformities, erythema, or stiffness, ROM full and no nontender Ext: no edema and no cyanosis, pulses palpable bilaterally (DP and PT) Hematology: no cervical, inginal, or axillary adenopathy.  Neurological: A&O x3, Strenght is normal and symmetric bilaterally, cranial nerve II-XII are grossly intact, no focal motor deficit, sensory intact to light touch bilaterally.  Skin: gluteal abscess at least 6 inches in diameter extending into right posterior thigh.  Psychiatric: Normal mood and affect. speech and behavior is normal. Judgment and thought content normal. Cognition and memory are normal.   Labs on Admission:  Results for orders placed during the hospital encounter of 07/17/11 (from the past 48 hour(s))  CBC     Status: Abnormal   Collection Time   07/17/11  3:11 PM      Component Value Range Comment   WBC 7.0  4.0 - 10.5 (K/uL)    RBC 3.92  3.87 - 5.11 (MIL/uL)    Hemoglobin 12.2  12.0 - 15.0 (g/dL)    HCT 84.6 (*) 96.2 - 46.0 (%)    MCV 90.8  78.0 - 100.0 (fL)    MCH 31.1  26.0 - 34.0 (pg)    MCHC 34.3  30.0 - 36.0 (g/dL)    RDW 95.2  84.1 - 32.4 (%)    Platelets 205  150 - 400 (K/uL)   DIFFERENTIAL     Status: Abnormal   Collection  Time   07/17/11  3:11 PM      Component Value Range Comment   Neutrophils Relative 63  43 - 77 (%)    Neutro Abs 4.4  1.7 - 7.7 (K/uL)    Lymphocytes Relative 20  12 - 46 (%)    Lymphs Abs 1.4  0.7 - 4.0 (K/uL)    Monocytes Relative 15 (*) 3 - 12 (%)    Monocytes Absolute 1.1 (*) 0.1 - 1.0 (K/uL)    Eosinophils Relative 2  0 - 5 (%)    Eosinophils Absolute 0.1  0.0 - 0.7 (K/uL)    Basophils Relative 0  0 - 1 (%)    Basophils Absolute 0.0  0.0 - 0.1 (K/uL)   SEDIMENTATION RATE     Status: Abnormal   Collection Time   07/17/11  3:11 PM      Component Value Range Comment   Sed Rate 45 (*) 0 - 22 (mm/hr)   BASIC METABOLIC PANEL     Status: Abnormal   Collection Time   07/17/11  3:11 PM      Component Value Range Comment   Sodium 127 (*) 135 - 145 (mEq/L)    Potassium 3.8  3.5 - 5.1 (mEq/L)    Chloride 92 (*) 96 - 112 (mEq/L)    CO2 26  19 - 32 (mEq/L)    Glucose, Bld 84  70 - 99 (mg/dL)    BUN 6  6 - 23 (mg/dL)    Creatinine, Ser 4.01  0.50 - 1.10 (mg/dL)    Calcium 9.5  8.4 - 10.5 (mg/dL)    GFR calc non Af Amer 88 (*) >90 (mL/min)    GFR calc Af Amer >90  >90 (mL/min)   MAGNESIUM     Status: Normal   Collection Time   07/17/11  3:11 PM      Component Value Range Comment   Magnesium 2.2  1.5 - 2.5 (mg/dL)   PHOSPHORUS     Status: Normal   Collection Time   07/17/11  3:11 PM      Component Value Range Comment   Phosphorus 2.7  2.3 - 4.6 (mg/dL)   APTT     Status: Abnormal   Collection Time   07/17/11  3:11 PM      Component Value Range Comment   aPTT 44 (*) 24 - 37 (seconds)   HEPATIC FUNCTION PANEL     Status: Normal   Collection Time   07/17/11  3:30 PM  Component Value Range Comment   Total Protein 6.5      Albumin 3.4      AST 22      ALT 15      Alkaline Phosphatase 82      Total Bilirubin 0.3      Bilirubin, Direct <0.1      Indirect Bilirubin NOT CALCULATED       Radiological Exams on Admission: No results found.  Time Spent on Admission: Over 30  minutes  Charlene Vincent 07/17/2011, 8:59 PM  [facility] Pager # [phone] Main Office # [phone]

## 2011-07-17 NOTE — ED Notes (Signed)
Called to give report to  [facility].  She to call me back.

## 2011-07-18 DIAGNOSIS — R112 Nausea with vomiting, unspecified: Secondary | ICD-10-CM

## 2011-07-18 DIAGNOSIS — L0231 Cutaneous abscess of buttock: Secondary | ICD-10-CM

## 2011-07-18 DIAGNOSIS — E032 Hypothyroidism due to medicaments and other exogenous substances: Secondary | ICD-10-CM

## 2011-07-18 DIAGNOSIS — I10 Essential (primary) hypertension: Secondary | ICD-10-CM

## 2011-07-18 DIAGNOSIS — L03317 Cellulitis of buttock: Secondary | ICD-10-CM

## 2011-07-18 LAB — CBC
Platelets: 212 10*3/uL (ref 150–400)
RBC: 3.98 MIL/uL (ref 3.87–5.11)
WBC: 5.7 10*3/uL (ref 4.0–10.5)

## 2011-07-18 LAB — COMPREHENSIVE METABOLIC PANEL
ALT: 15 U/L (ref 0–35)
AST: 20 U/L (ref 0–37)
Albumin: 3.4 g/dL — ABNORMAL LOW (ref 3.5–5.2)
CO2: 28 mEq/L (ref 19–32)
Chloride: 94 mEq/L — ABNORMAL LOW (ref 96–112)
GFR calc non Af Amer: 87 mL/min — ABNORMAL LOW (ref >90)
Sodium: 130 mEq/L — ABNORMAL LOW (ref 135–145)
Total Bilirubin: 0.3 mg/dL (ref 0.3–1.2)

## 2011-07-18 LAB — GLUCOSE, CAPILLARY: Glucose-Capillary: 98 mg/dL (ref 70–99)

## 2011-07-18 MED ORDER — PHENYLEPH-SHARK LIV OIL-MO-PET 0.25-3-14-71.9 % RE OINT: TOPICAL_OINTMENT | Freq: Two times a day (BID) | RECTAL | Status: DC | PRN

## 2011-07-18 NOTE — Consult Note (Signed)
Reason for Consult: Right gluteal abscess Referring Physician: Hospitalists  Charlene Vincent is an 77 y.o. female.  HPI: Patient is a 77 year old white female presented with a four-day history of worsening right gluteal pain. She was seen the emergency room yesterday and found to have a right gluteal abscess. This was incised and drained in the emergency room. She was admitted to the hospital for IV antibiotic therapy.  Past Medical History  Diagnosis Date  . Takotsubo cardiomyopathy     s/p cath July 2012. EF is 35 to 40%. 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 Hypothyroidism   . Anemia   . Costochondritis   . Arthritis   . Cellulitis     Past Surgical History  Procedure Date  . Inguinal hernia repair   . Cardiac catheterization September 10, 2010    EF 35 to 40%. No significant CAD. ? takotsubo cardiomyopathy    Family History  Problem Relation Age of Onset  . Heart disease Father   . Heart disease Brother     Social History:  reports that she quit smoking about 10 months ago. Her smoking use included Cigarettes. She smoked .5 packs per day. She does not have any smokeless tobacco history on file. She reports that she does not drink alcohol or use illicit drugs.  Allergies:  Allergies  Allergen Reactions  . Aspirin     But taking low dose therapy without problems.  . Penicillins Rash    Medications: I have reviewed the patient's current medications.  Results for orders placed during the hospital encounter of 07/17/11 (from the past 48 hour(s))  CBC     Status: Abnormal   Collection Time   07/17/11  3:11 PM      Component Value Range Comment   WBC 7.0  4.0 - 10.5 (K/uL)    RBC 3.92  3.87 - 5.11 (MIL/uL)    Hemoglobin 12.2  12.0 - 15.0 (g/dL)    HCT 40.9 (*) 81.1 - 46.0 (%)    MCV 90.8  78.0 - 100.0 (fL)    MCH 31.1  26.0 - 34.0 (pg)    MCHC 34.3  30.0 - 36.0 (g/dL)    RDW 91.4  78.2 - 95.6 (%)    Platelets 205  150 - 400 (K/uL)   DIFFERENTIAL     Status:  Abnormal   Collection Time   07/17/11  3:11 PM      Component Value Range Comment   Neutrophils Relative 63  43 - 77 (%)    Neutro Abs 4.4  1.7 - 7.7 (K/uL)    Lymphocytes Relative 20  12 - 46 (%)    Lymphs Abs 1.4  0.7 - 4.0 (K/uL)    Monocytes Relative 15 (*) 3 - 12 (%)    Monocytes Absolute 1.1 (*) 0.1 - 1.0 (K/uL)    Eosinophils Relative 2  0 - 5 (%)    Eosinophils Absolute 0.1  0.0 - 0.7 (K/uL)    Basophils Relative 0  0 - 1 (%)    Basophils Absolute 0.0  0.0 - 0.1 (K/uL)   SEDIMENTATION RATE     Status: Abnormal   Collection Time   07/17/11  3:11 PM      Component Value Range Comment   Sed Rate 45 (*) 0 - 22 (mm/hr)   BASIC METABOLIC PANEL     Status: Abnormal   Collection Time   07/17/11  3:11 PM      Component  Value Range Comment   Sodium 127 (*) 135 - 145 (mEq/L)    Potassium 3.8  3.5 - 5.1 (mEq/L)    Chloride 92 (*) 96 - 112 (mEq/L)    CO2 26  19 - 32 (mEq/L)    Glucose, Bld 84  70 - 99 (mg/dL)    BUN 6  6 - 23 (mg/dL)    Creatinine, Ser 0.98  0.50 - 1.10 (mg/dL)    Calcium 9.5  8.4 - 10.5 (mg/dL)    GFR calc non Af Amer 88 (*) >90 (mL/min)    GFR calc Af Amer >90  >90 (mL/min)   MAGNESIUM     Status: Normal   Collection Time   07/17/11  3:11 PM      Component Value Range Comment   Magnesium 2.2  1.5 - 2.5 (mg/dL)   PHOSPHORUS     Status: Normal   Collection Time   07/17/11  3:11 PM      Component Value Range Comment   Phosphorus 2.7  2.3 - 4.6 (mg/dL)   APTT     Status: Abnormal   Collection Time   07/17/11  3:11 PM      Component Value Range Comment   aPTT 44 (*) 24 - 37 (seconds)   HEPATIC FUNCTION PANEL     Status: Normal   Collection Time   07/17/11  3:30 PM      Component Value Range Comment   Total Protein 6.5      Albumin 3.4      AST 22      ALT 15      Alkaline Phosphatase 82      Total Bilirubin 0.3      Bilirubin, Direct <0.1      Indirect Bilirubin NOT CALCULATED     GLUCOSE, CAPILLARY     Status: Normal   Collection Time   07/18/11  7:22  AM      Component Value Range Comment   Glucose-Capillary 98  70 - 99 (mg/dL)    Comment 1 Notify RN       No results found.  ROS: See chart Blood pressure 106/69, pulse 73, temperature 98 F (36.7 C), temperature source Oral, resp. rate 20, height 5\' 7"  (1.702 m), weight 60.6 kg (133 lb 9.6 oz), SpO2 97.00%. Physical Exam: Pleasant white female in no acute distress. Right gluteal region with erythema and a small area of induration. There is an incision with packing present in the right gluteal region. The induration is approximately 2 cm in its greatest diameter. The packing was removed. Some purulent drainage present.  Assessment/Plan: Impression: Right gluteal abscess, status post incision and drainage. No need for further incision at this time. Agree with antibiotic therapy. Wound care orders have been written. We'll follow with you.  Urie Loughner A 07/18/2011, 8:02 AM

## 2011-07-18 NOTE — Progress Notes (Signed)
Patient's abscess packed with iodoform gauze. Minimal serosanguinous drainage noted.  Hard reddened area around incision.

## 2011-07-18 NOTE — Consult Note (Signed)
ANTIBIOTIC CONSULT NOTE - INITIAL  Pharmacy Consult for Vancomycin and Zosyn NOTE: PT HAS ALLERGY TO PENICILLIN SO ZOSYN NOT GIVEN  Indication: cellulitis  Allergies  Allergen Reactions  . Aspirin     But taking low dose therapy without problems.  . Penicillins Rash    Patient Measurements: Height: 5\' 7"  (170.2 cm) Weight: 133 lb 9.6 oz (60.6 kg) IBW/kg (Calculated) : 61.6   Vital Signs: Temp: 98 F (36.7 C) (05/25 0534) Temp src: Oral (05/25 0534) BP: 106/69 mmHg (05/25 0534) Pulse Rate: 73  (05/25 0734) Intake/Output from previous day: 05/24 0701 - 05/25 0700 In: 595 [P.O.:360; I.V.:85; IV Piggyback:150] Out: 750 [Urine:750] Intake/Output from this shift:    Labs:  Basename 07/18/11 0755 07/17/11 1511  WBC 5.7 7.0  HGB 12.3 12.2  PLT 212 205  LABCREA -- --  CREATININE 0.60 0.58   Estimated Creatinine Clearance: 58.1 ml/min (by C-G formula based on Cr of 0.6). No results found for this basename: VANCOTROUGH:2,VANCOPEAK:2,VANCORANDOM:2,GENTTROUGH:2,GENTPEAK:2,GENTRANDOM:2,TOBRATROUGH:2,TOBRAPEAK:2,TOBRARND:2,AMIKACINPEAK:2,AMIKACINTROU:2,AMIKACIN:2, in the last 72 hours   Microbiology: No results found for this or any previous visit (from the past 720 hour(s)).  Medical History: Past Medical History  Diagnosis Date  . Takotsubo cardiomyopathy     s/p cath July 2012. EF is 35 to 40%. 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 Hypothyroidism   . Anemia   . Costochondritis   . Arthritis   . Cellulitis    Medications:  Scheduled:    . sodium chloride   Intravenous STAT  . atorvastatin  20 mg Oral q1800  . cholecalciferol  400 Units Oral Daily  . ciprofloxacin  400 mg Intravenous Once  . diphenhydrAMINE  25 mg Oral QHS  . docusate sodium  100 mg Oral BID  . enoxaparin (LOVENOX) injection  40 mg Subcutaneous Q24H  . ferrous sulfate  325 mg Oral TID WC  . fluticasone  1 spray Each Nare BID  . levothyroxine  50 mcg Oral QAC breakfast  . lidocaine   10 mL Infiltration Once  . loratadine  10 mg Oral Daily  . mulitivitamin with minerals  1 tablet Oral Daily  . sodium chloride      . vancomycin  750 mg Intravenous Q12H  . vancomycin  1,000 mg Intravenous Once  . DISCONTD: CENTRUM SILVER  1 tablet Oral Daily  . DISCONTD: enoxaparin  30 mg Subcutaneous Q24H   Assessment: 77yoF with cellulitis and abscess of buttocks, afebrile with normal WBC, failed OP Rx with doxycycline.  Has allergy to PENICILLIN. Vancomycin given on admission. Has good renal fxn for age Estimated Creatinine Clearance: 58.1 ml/min (by C-G formula based on Cr of 0.6).  Goal of Therapy:  Vancomycin trough level 10-15 mcg/ml Eradicate infection.  Plan: Vancomycin 750mg  iv q12hrs Check trough at steady state Consider using Levaquin or Aztreonam instead of Zosyn Labs per protocol  Valrie Hart A 07/18/2011,9:55 AM

## 2011-07-18 NOTE — Progress Notes (Signed)
[facility] PROGRESS NOTE  Charlene SLOMSKI [id number] DOB: May 09, 1934 DOA: 07/17/2011 PCP: Pamelia Hoit, MD, MD  Assessment/Plan: 1. Abscess - continue current abx . If no improvement by tomorrow will change cipro to aztreonam  2. Hypothyroidism - stable. Cont synhroid 3. HTN  Principal Problem:  *Abscess or cellulitis of gluteal region Active Problems:  Hypothyroidism  Anemia  HTN (hypertension)  Code Status: full Family Communication: Chandran,Rex Spouse [phone]  Disposition Plan: home  Beverlyn Mcginness, MD  [facility] Pager [phone]  If 7PM-7AM, please contact night-coverage [email or web address] Password [email or web address] 07/18/2011, 10:34 AM   LOS: 1 day   Brief narrative: 77 yo woman admitted with thigh abscess and cellulitis  Consultants:  Gen Surgery   Procedures:    Antibiotics:  vanc 5/24--  cipro 5/24  HPI/Subjective: C/o thigh pain  Objective: Filed Vitals:   07/17/11 2112 07/18/11 0439 07/18/11 0534 07/18/11 0734  BP: 152/81  106/69   Pulse: 73  73 73  Temp: 97.8 F (36.6 C)  98 F (36.7 C)   TempSrc: Oral  Oral   Resp: 18  20   Height:      Weight:  60.6 kg (133 lb 9.6 oz)    SpO2: 100%  98% 97%    Intake/Output Summary (Last 24 hours) at 07/18/11 1034 Last data filed at 07/18/11 0900  Gross per 24 hour  Intake   1315 ml  Output    750 ml  Net    565 ml    Exam:   General:  Alert and oriented x3  Cardiovascular: RRR  Respiratory: CTAB  Abdomen: soft, NT  Right inner thigh with erythema and pustule   Data Reviewed: Basic Metabolic Panel:  Lab 07/18/11 9563 07/17/11 1511  NA 130* 127*  K 3.7 3.8  CL 94* 92*  CO2 28 26  GLUCOSE 97 84  BUN 3* 6  CREATININE 0.60 0.58  CALCIUM 9.4 9.5  MG -- 2.2  PHOS -- 2.7   Liver Function Tests:  Lab 07/18/11 0755 07/17/11 1530  AST 20 22  ALT 15 15  ALKPHOS 80 82  BILITOT 0.3 0.3  PROT 7.0 6.5  ALBUMIN 3.4* 3.4   No results found for this basename:  LIPASE:5,AMYLASE:5 in the last 168 hours No results found for this basename: AMMONIA:5 in the last 168 hours CBC:  Lab 07/18/11 0755 07/17/11 1511  WBC 5.7 7.0  NEUTROABS -- 4.4  HGB 12.3 12.2  HCT 36.1 35.6*  MCV 90.7 90.8  PLT 212 205   Cardiac Enzymes: No results found for this basename: CKTOTAL:5,CKMB:5,CKMBINDEX:5,TROPONINI:5 in the last 168 hours BNP (last 3 results) No results found for this basename: PROBNP:3 in the last 8760 hours CBG:  Lab 07/18/11 0722  GLUCAP 98    No results found for this or any previous visit (from the past 240 hour(s)).   Studies: No results found.  Scheduled Meds:   . sodium chloride   Intravenous STAT  . atorvastatin  20 mg Oral q1800  . cholecalciferol  400 Units Oral Daily  . ciprofloxacin  400 mg Intravenous Once  . diphenhydrAMINE  25 mg Oral QHS  . docusate sodium  100 mg Oral BID  . enoxaparin (LOVENOX) injection  40 mg Subcutaneous Q24H  . ferrous sulfate  325 mg Oral TID WC  . fluticasone  1 spray Each Nare BID  . levothyroxine  50 mcg Oral QAC breakfast  . lidocaine  10 mL Infiltration Once  . loratadine  10 mg Oral  Daily  . mulitivitamin with minerals  1 tablet Oral Daily  . sodium chloride      . vancomycin  750 mg Intravenous Q12H  . vancomycin  1,000 mg Intravenous Once  . DISCONTD: CENTRUM SILVER  1 tablet Oral Daily  . DISCONTD: enoxaparin  30 mg Subcutaneous Q24H   Continuous Infusions:

## 2011-07-19 DIAGNOSIS — R112 Nausea with vomiting, unspecified: Secondary | ICD-10-CM

## 2011-07-19 DIAGNOSIS — E032 Hypothyroidism due to medicaments and other exogenous substances: Secondary | ICD-10-CM

## 2011-07-19 DIAGNOSIS — L03317 Cellulitis of buttock: Secondary | ICD-10-CM

## 2011-07-19 DIAGNOSIS — I10 Essential (primary) hypertension: Secondary | ICD-10-CM

## 2011-07-19 DIAGNOSIS — L0231 Cutaneous abscess of buttock: Secondary | ICD-10-CM

## 2011-07-19 LAB — OSMOLALITY: Osmolality: 270 mOsm/kg — ABNORMAL LOW (ref 275–300)

## 2011-07-19 MED ORDER — LISINOPRIL 5 MG PO TABS
2.5000 mg | ORAL_TABLET | Freq: Every day | ORAL | Status: DC
  Administered 2011-07-19: 2.5 mg via ORAL
  Filled 2011-07-19: qty 1

## 2011-07-19 MED ORDER — SODIUM CHLORIDE 0.9 % IV SOLN
500.0000 mg | Freq: Three times a day (TID) | INTRAVENOUS | Status: DC
  Administered 2011-07-19 – 2011-07-20 (×4): 500 mg via INTRAVENOUS
  Filled 2011-07-19 (×7): qty 500

## 2011-07-19 NOTE — Progress Notes (Signed)
[facility]                                                                                 Patient Demographics  Charlene Vincent, is a 77 y.o. female  ZOX:[id number]  [id number]  DOB - [birth year]  Admit date - 07/17/2011  Admitting Physician No admitting provider for patient encounter.  Outpatient Primary MD for the patient is Pamelia Hoit, MD, MD  LOS - 2     Chief Complaint  Patient presents with  . Abscess        Subjective:   Charlene Vincent today has, No headache, No chest pain, No abdominal pain - No Nausea, No new weakness tingling or numbness, No Cough - SOB. Her right buttock pain is better.  Objective:   Filed Vitals:   07/18/11 1351 07/18/11 2033 07/19/11 0445 07/19/11 0743  BP: 107/64 123/71 120/70   Pulse: 78 67 62 69  Temp: 98.2 F (36.8 C) 97.8 F (36.6 C) 98 F (36.7 C)   TempSrc:  Oral Oral   Resp: 19 18 16    Height:      Weight:   61.5 kg (135 lb 9.3 oz)   SpO2: 100% 97% 100% 98%    Wt Readings from Last 3 Encounters:  07/19/11 61.5 kg (135 lb 9.3 oz)  05/21/11 60.328 kg (133 lb)  01/21/11 60.691 kg (133 lb 12.8 oz)     Intake/Output Summary (Last 24 hours) at 07/19/11 0826 Last data filed at 07/19/11 0044  Gross per 24 hour  Intake   1160 ml  Output   1000 ml  Net    160 ml    Exam Awake Alert, Oriented *3, No new F.N deficits, Normal affect [place].AT,PERRAL Supple Neck,No JVD, No cervical lymphadenopathy appriciated.  Symmetrical Chest wall movement, Good air movement bilaterally, CTAB RRR,No Gallops,Rubs or new Murmurs, No Parasternal Heave +ve B.Sounds, Abd Soft, Non tender, No organomegaly appriciated, No rebound -guarding or rigidity. No Cyanosis, Clubbing or edema, No new Rash or bruise, moderate to severe cellulitis in the right inner thigh and buttock area, I&D site looks stable no fluctuance noted.  Data Review  CBC  Lab 07/18/11 0755 07/17/11 1511  WBC 5.7 7.0  HGB 12.3 12.2  HCT 36.1  35.6*  PLT 212 205  MCV 90.7 90.8  MCH 30.9 31.1  MCHC 34.1 34.3  RDW 13.2 13.4  LYMPHSABS -- 1.4  MONOABS -- 1.1*  EOSABS -- 0.1  BASOSABS -- 0.0  BANDABS -- --    Chemistries   Lab 07/18/11 0755 07/17/11 1530 07/17/11 1511  NA 130* -- 127*  K 3.7 -- 3.8  CL 94* -- 92*  CO2 28 -- 26  GLUCOSE 97 -- 84  BUN 3* -- 6  CREATININE 0.60 -- 0.58  CALCIUM 9.4 -- 9.5  MG -- -- 2.2  AST 20 22 --  ALT 15 15 --  ALKPHOS 80 82 --  BILITOT 0.3 0.3 --   ------------------------------------------------------------------------------------------------------------------ estimated creatinine clearance is 59 ml/min (by C-G formula based on Cr of 0.6). ------------------------------------------------------------------------------------------------------------------ No results found for this basename: HGBA1C:2 in the last 72 hours ------------------------------------------------------------------------------------------------------------------ No results found for this basename: CHOL:2,HDL:2,LDLCALC:2,TRIG:2,CHOLHDL:2,LDLDIRECT:2 in  the last 72 hours ------------------------------------------------------------------------------------------------------------------ No results found for this basename: TSH,T4TOTAL,FREET3,T3FREE,THYROIDAB in the last 72 hours ------------------------------------------------------------------------------------------------------------------ No results found for this basename: VITAMINB12:2,FOLATE:2,FERRITIN:2,TIBC:2,IRON:2,RETICCTPCT:2 in the last 72 hours  Coagulation profile No results found for this basename: INR:5,PROTIME:5 in the last 168 hours  No results found for this basename: DDIMER:2 in the last 72 hours  Cardiac Enzymes No results found for this basename: CK:3,CKMB:3,TROPONINI:3,MYOGLOBIN:3 in the last 168 hours ------------------------------------------------------------------------------------------------------------------ No components found with  this basename: POCBNP:3  Micro Results No results found for this or any previous visit (from the past 240 hour(s)).  Radiology Reports No results found.  Scheduled Meds:   . sodium chloride   Intravenous STAT  . atorvastatin  20 mg Oral q1800  . cholecalciferol  400 Units Oral Daily  . diphenhydrAMINE  25 mg Oral QHS  . docusate sodium  100 mg Oral BID  . enoxaparin (LOVENOX) injection  40 mg Subcutaneous Q24H  . ferrous sulfate  325 mg Oral TID WC  . fluticasone  1 spray Each Nare BID  . levothyroxine  50 mcg Oral QAC breakfast  . loratadine  10 mg Oral Daily  . mulitivitamin with minerals  1 tablet Oral Daily  . vancomycin  750 mg Intravenous Q12H   Continuous Infusions:  PRN Meds:.acetaminophen, acetaminophen, alum & mag hydroxide-simeth, hydrALAZINE, HYDROcodone-acetaminophen, morphine injection, morphine, ondansetron (ZOFRAN) IV, ondansetron, phenylephrine-shark liver oil-mineral oil-petrolatum  Assessment & Plan   1. Rt Abscess or cellulitis of gluteal region- post IND, general surgery Dr. Lovell Sheehan following the patient, still has considerable redness and cellulitis around the area, she is on vancomycin, at this time we'll add Zosyn for broader coverage and considering the site of infection, patient continues to be afebrile and does not have leukocytosis/   2. Hypothyroidism - continue home dose Synthroid and monitor TSH as outpatient.   3.History of anemia of chronic disease along with essential hypertension no acute issues blood pressure is stable without any medications continue outpatient monitoring. Outpatient anemia workup as indicated.   4. Hyponatremia has already improved without IV fluids, could be SIADH, will check serum osmolality, urine sodium and osmolality and repeat BMP in the morning.   5. Chronic systolic heart failure no acute issues patient is clinically compensated last EF is around 35% - patient currently compensated we'll add low-dose lisinopril as  blood pressure 120/70 and monitor creatinine. Outpatient cardiology followup.   6. History of dyslipidemia - continue home dose statin and outpatient monitoring of her stroke.    DVT Prophylaxis  Lovenox        Leroy Sea M.D on 07/19/2011 at 8:26 AM   [facility] Office  [phone]

## 2011-07-19 NOTE — Consult Note (Signed)
ANTIBIOTIC CONSULT NOTE   Pharmacy Consult for Vancomycin and Primaxin NOTE: PT HAS ALLERGY TO PENICILLIN SO ZOSYN D/C'D, D/W DR [facility], ADD PRIMAXIN Vancomycin trough was ordered this am but was canceled by RN  Indication: cellulitis  Allergies  Allergen Reactions  . Aspirin     But taking low dose therapy without problems.  . Penicillins Rash   Patient Measurements: Height: 5\' 7"  (170.2 cm) Weight: 135 lb 9.3 oz (61.5 kg) IBW/kg (Calculated) : 61.6   Vital Signs: Temp: 98 F (36.7 C) (05/26 0445) Temp src: Oral (05/26 0445) BP: 120/70 mmHg (05/26 0445) Pulse Rate: 69  (05/26 0743) Intake/Output from previous day: 05/25 0701 - 05/26 0700 In: 1160 [P.O.:1160] Out: 1000 [Urine:1000] Intake/Output from this shift:    Labs:  Basename 07/18/11 0755 07/17/11 1511  WBC 5.7 7.0  HGB 12.3 12.2  PLT 212 205  LABCREA -- --  CREATININE 0.60 0.58   Estimated Creatinine Clearance: 59 ml/min (by C-G formula based on Cr of 0.6). No results found for this basename: VANCOTROUGH:2,VANCOPEAK:2,VANCORANDOM:2,GENTTROUGH:2,GENTPEAK:2,GENTRANDOM:2,TOBRATROUGH:2,TOBRAPEAK:2,TOBRARND:2,AMIKACINPEAK:2,AMIKACINTROU:2,AMIKACIN:2, in the last 72 hours   Microbiology: No results found for this or any previous visit (from the past 720 hour(s)).  Medical History: Past Medical History  Diagnosis Date  . Takotsubo cardiomyopathy     s/p cath July 2012. EF is 35 to 40%. 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 Hypothyroidism   . Anemia   . Costochondritis   . Arthritis   . Cellulitis    Medications:  Scheduled:     . sodium chloride   Intravenous STAT  . atorvastatin  20 mg Oral q1800  . cholecalciferol  400 Units Oral Daily  . diphenhydrAMINE  25 mg Oral QHS  . docusate sodium  100 mg Oral BID  . enoxaparin (LOVENOX) injection  40 mg Subcutaneous Q24H  . ferrous sulfate  325 mg Oral TID WC  . fluticasone  1 spray Each Nare BID  . imipenem-cilastatin  500 mg Intravenous Q8H    . levothyroxine  50 mcg Oral QAC breakfast  . lisinopril  2.5 mg Oral Daily  . loratadine  10 mg Oral Daily  . mulitivitamin with minerals  1 tablet Oral Daily  . vancomycin  750 mg Intravenous Q12H   Assessment: 77yoF with cellulitis and abscess of buttocks, afebrile with normal WBC, failed OP Rx with doxycycline.  Has allergy to PENICILLIN.  Has good renal fxn for age Estimated Creatinine Clearance: 59 ml/min (by C-G formula based on Cr of 0.6). Vancomycin trough was canceled by RN, presumably due to dose being hung before lab could draw level.  Goal of Therapy:  Vancomycin trough level 10-15 mcg/ml Eradicate infection.  Plan: Vancomycin 750mg  iv q12hrs Check trough tomorrow Primaxin 500mg  iv q8hrs (d/w Dr Thedore Mins) Labs per protocol  Valrie Hart A 07/19/2011,8:45 AM

## 2011-07-19 NOTE — Consult Note (Signed)
ANTIBIOTIC CONSULT NOTE   Pharmacy Consult for Vancomycin and Zosyn NOTE: PT HAS ALLERGY TO PENICILLIN SO ZOSYN NOT GIVEN Vancomycin trough was ordered this am but was canceled by RN  Indication: cellulitis  Allergies  Allergen Reactions  . Aspirin     But taking low dose therapy without problems.  . Penicillins Rash   Patient Measurements: Height: 5\' 7"  (170.2 cm) Weight: 135 lb 9.3 oz (61.5 kg) IBW/kg (Calculated) : 61.6   Vital Signs: Temp: 98 F (36.7 C) (05/26 0445) Temp src: Oral (05/26 0445) BP: 120/70 mmHg (05/26 0445) Pulse Rate: 69  (05/26 0743) Intake/Output from previous day: 05/25 0701 - 05/26 0700 In: 1160 [P.O.:1160] Out: 1000 [Urine:1000] Intake/Output from this shift:    Labs:  Basename 07/18/11 0755 07/17/11 1511  WBC 5.7 7.0  HGB 12.3 12.2  PLT 212 205  LABCREA -- --  CREATININE 0.60 0.58   Estimated Creatinine Clearance: 59 ml/min (by C-G formula based on Cr of 0.6). No results found for this basename: VANCOTROUGH:2,VANCOPEAK:2,VANCORANDOM:2,GENTTROUGH:2,GENTPEAK:2,GENTRANDOM:2,TOBRATROUGH:2,TOBRAPEAK:2,TOBRARND:2,AMIKACINPEAK:2,AMIKACINTROU:2,AMIKACIN:2, in the last 72 hours   Microbiology: No results found for this or any previous visit (from the past 720 hour(s)).  Medical History: Past Medical History  Diagnosis Date  . Takotsubo cardiomyopathy     s/p cath July 2012. EF is 35 to 40%. 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 Hypothyroidism   . Anemia   . Costochondritis   . Arthritis   . Cellulitis    Medications:  Scheduled:     . sodium chloride   Intravenous STAT  . atorvastatin  20 mg Oral q1800  . cholecalciferol  400 Units Oral Daily  . diphenhydrAMINE  25 mg Oral QHS  . docusate sodium  100 mg Oral BID  . enoxaparin (LOVENOX) injection  40 mg Subcutaneous Q24H  . ferrous sulfate  325 mg Oral TID WC  . fluticasone  1 spray Each Nare BID  . levothyroxine  50 mcg Oral QAC breakfast  . loratadine  10 mg Oral  Daily  . mulitivitamin with minerals  1 tablet Oral Daily  . vancomycin  750 mg Intravenous Q12H   Assessment: 77yoF with cellulitis and abscess of buttocks, afebrile with normal WBC, failed OP Rx with doxycycline.  Has allergy to PENICILLIN.  Has good renal fxn for age Estimated Creatinine Clearance: 59 ml/min (by C-G formula based on Cr of 0.6). Vancomycin trough was canceled by RN, presumably due to dose being hung before lab could draw level.  Goal of Therapy:  Vancomycin trough level 10-15 mcg/ml Eradicate infection.  Plan: Vancomycin 750mg  iv q12hrs Check trough tomorrow Consider using Levaquin or Aztreonam instead of Zosyn Labs per protocol  Valrie Hart A 07/19/2011,7:54 AM

## 2011-07-19 NOTE — Progress Notes (Signed)
  Subjective: No significant change since yesterday. Patient is afebrile. She states the tenderness is decreased since yesterday.  Objective: Vital signs in last 24 hours: Temp:  [97.8 F (36.6 C)-98.2 F (36.8 C)] 98 F (36.7 C) (05/26 0445) Pulse Rate:  [62-78] 69  (05/26 0743) Resp:  [16-19] 16  (05/26 0445) BP: (107-123)/(64-71) 120/70 mmHg (05/26 0445) SpO2:  [97 %-100 %] 98 % (05/26 0743) Weight:  [61.5 kg (135 lb 9.3 oz)] 61.5 kg (135 lb 9.3 oz) (05/26 0445) Last BM Date: 07/18/11  Intake/Output from previous day: 05/25 0701 - 05/26 0700 In: 1160 [P.O.:1160] Out: 1000 [Urine:1000] Intake/Output this shift: Total I/O In: 480 [P.O.:480] Out: -   General appearance: alert, cooperative and appears stated age Right buttock abscess not significantly changed. There is still erythema in the area and induration in the subcutaneous tissue. I cannot express much drainage.  Lab Results:   Basename 07/18/11 0755 07/17/11 1511  WBC 5.7 7.0  HGB 12.3 12.2  HCT 36.1 35.6*  PLT 212 205   BMET  Basename 07/18/11 0755 07/17/11 1511  NA 130* 127*  K 3.7 3.8  CL 94* 92*  CO2 28 26  GLUCOSE 97 84  BUN 3* 6  CREATININE 0.60 0.58  CALCIUM 9.4 9.5   PT/INR No results found for this basename: LABPROT:2,INR:2 in the last 72 hours  Studies/Results: No results found.  Anti-infectives: Anti-infectives     Start     Dose/Rate Route Frequency Ordered Stop   07/19/11 1000   imipenem-cilastatin (PRIMAXIN) 500 mg in sodium chloride 0.9 % 100 mL IVPB        500 mg 200 mL/hr over 30 Minutes Intravenous Every 8 hours 07/19/11 0845     07/18/11 0600   vancomycin (VANCOCIN) 750 mg in sodium chloride 0.9 % 150 mL IVPB        750 mg 150 mL/hr over 60 Minutes Intravenous Every 12 hours 07/17/11 2020     07/17/11 1700   ciprofloxacin (CIPRO) IVPB 400 mg        400 mg 200 mL/hr over 60 Minutes Intravenous  Once 07/17/11 1656 07/17/11 1827   07/17/11 1500   vancomycin (VANCOCIN) IVPB  1000 mg/200 mL premix        1,000 mg 200 mL/hr over 60 Minutes Intravenous  Once 07/17/11 1459 07/17/11 1728          Assessment/Plan: Impression: Right gluteal abscess, not significantly changed since yesterday. Given the induration is still present, she may need to go to the operating room for further debridement. I would like to see what happens over the next 24 hours. She is not septic and she has no leukocytosis or fever.  LOS: 2 days    Charlene Vincent A 07/19/2011

## 2011-07-20 ENCOUNTER — Encounter (HOSPITAL_COMMUNITY): Payer: Self-pay | Admitting: Internal Medicine

## 2011-07-20 DIAGNOSIS — L0231 Cutaneous abscess of buttock: Secondary | ICD-10-CM

## 2011-07-20 DIAGNOSIS — E871 Hypo-osmolality and hyponatremia: Secondary | ICD-10-CM | POA: Diagnosis present

## 2011-07-20 DIAGNOSIS — E032 Hypothyroidism due to medicaments and other exogenous substances: Secondary | ICD-10-CM

## 2011-07-20 DIAGNOSIS — R112 Nausea with vomiting, unspecified: Secondary | ICD-10-CM

## 2011-07-20 DIAGNOSIS — L03317 Cellulitis of buttock: Secondary | ICD-10-CM

## 2011-07-20 DIAGNOSIS — D72819 Decreased white blood cell count, unspecified: Secondary | ICD-10-CM | POA: Diagnosis not present

## 2011-07-20 LAB — CBC
HCT: 34 % — ABNORMAL LOW (ref 36.0–46.0)
Hemoglobin: 11.7 g/dL — ABNORMAL LOW (ref 12.0–15.0)
MCH: 31.4 pg (ref 26.0–34.0)
MCV: 91.2 fL (ref 78.0–100.0)
RBC: 3.73 MIL/uL — ABNORMAL LOW (ref 3.87–5.11)

## 2011-07-20 LAB — BASIC METABOLIC PANEL
CO2: 26 mEq/L (ref 19–32)
Calcium: 8.9 mg/dL (ref 8.4–10.5)
Chloride: 96 mEq/L (ref 96–112)
Glucose, Bld: 93 mg/dL (ref 70–99)
Potassium: 3.9 mEq/L (ref 3.5–5.1)
Sodium: 130 mEq/L — ABNORMAL LOW (ref 135–145)

## 2011-07-20 MED ORDER — CLOTRIMAZOLE-BETAMETHASONE 1-0.05 % EX CREA: TOPICAL_CREAM | Freq: Two times a day (BID) | CUTANEOUS | Status: DC | PRN

## 2011-07-20 NOTE — Progress Notes (Signed)
Discharge instructions given to pt. And pt.'s husband with understanding verbalized. Pt. Taken to car via  W/c.

## 2011-07-20 NOTE — Progress Notes (Signed)
  Subjective: No complaints.  Objective: Vital signs in last 24 hours: Temp:  [97.5 F (36.4 C)-98.3 F (36.8 C)] 97.9 F (36.6 C) (05/27 0451) Pulse Rate:  [70-78] 70  (05/27 0451) Resp:  [18] 18  (05/27 0451) BP: (102-143)/(66-81) 121/66 mmHg (05/27 0451) SpO2:  [91 %-99 %] 91 % (05/27 0451) Weight:  [61.5 kg (135 lb 9.3 oz)] 61.5 kg (135 lb 9.3 oz) (05/27 0451) Last BM Date: 07/19/11  Intake/Output from previous day: 05/26 0701 - 05/27 0700 In: 1080 [P.O.:1080] Out: 1950 [Urine:1950] Intake/Output this shift:    General appearance: alert, cooperative and appears stated age Perineum with erythematous rash. Incision site healing well with minimal drainage. Induration is significantly decreased.  Lab Results:   Basename 07/20/11 0550 07/18/11 0755  WBC 3.2* 5.7  HGB 11.7* 12.3  HCT 34.0* 36.1  PLT 197 212   BMET  Basename 07/20/11 0550 07/18/11 0755  NA 130* 130*  K 3.9 3.7  CL 96 94*  CO2 26 28  GLUCOSE 93 97  BUN 4* 3*  CREATININE 0.62 0.60  CALCIUM 8.9 9.4   PT/INR No results found for this basename: LABPROT:2,INR:2 in the last 72 hours  Studies/Results: No results found.  Anti-infectives: Anti-infectives     Start     Dose/Rate Route Frequency Ordered Stop   07/19/11 1000   imipenem-cilastatin (PRIMAXIN) 500 mg in sodium chloride 0.9 % 100 mL IVPB        500 mg 200 mL/hr over 30 Minutes Intravenous Every 8 hours 07/19/11 0845     07/18/11 0600   vancomycin (VANCOCIN) 750 mg in sodium chloride 0.9 % 150 mL IVPB        750 mg 150 mL/hr over 60 Minutes Intravenous Every 12 hours 07/17/11 2020     07/17/11 1700   ciprofloxacin (CIPRO) IVPB 400 mg        400 mg 200 mL/hr over 60 Minutes Intravenous  Once 07/17/11 1656 07/17/11 1827   07/17/11 1500   vancomycin (VANCOCIN) IVPB 1000 mg/200 mL premix        1,000 mg 200 mL/hr over 60 Minutes Intravenous  Once 07/17/11 1459 07/17/11 1728          Assessment/Plan: Impression: Right gluteal  abscess much improved. She does have Vincent rash which may be secondary to antibiotic therapy. At this point, no further surgical intervention is warranted. I will be more happy to follow her as an outpatient. She states she does have antibiotics at home from her primary care physician. She may also need nystatin cream for her perineum.  LOS: 3 days    Charlene Vincent 07/20/2011

## 2011-07-20 NOTE — Discharge Summary (Signed)
Physician Discharge Summary  Charlene Vincent MRN: [id number] DOB/AGE: [birth year] 77 y.o.  PCP: Pamelia Hoit, MD, MD   Admit date: 07/17/2011 Discharge date: 07/20/2011  Discharge Diagnoses:  1. Abscess/cellulitis of the right your region. Status post local I&D by ED physician in the emergency department. 2. Hyponatremia. 3. Hypothyroidism. 4. Mild leukopenia, likely secondary to multiple antibiotics. 5. Chronic iron deficiency anemia. 6. History of Takotsubo cardiomyopathy. Ejection fraction 55-60% per 2-D echocardiogram in November 2012, improved from ejection fraction of 35-40%.   Medication List  As of 07/20/2011 11:10 AM   STOP taking these medications         levofloxacin 250 MG tablet         TAKE these medications         acetaminophen 325 MG tablet   Commonly known as: TYLENOL   Take 650 mg by mouth as needed. For pain      CENTRUM SILVER tablet   Take 1 tablet by mouth daily.      cetirizine 10 MG tablet   Commonly known as: ZYRTEC   Take 10 mg by mouth every morning.      clotrimazole-betamethasone cream   Commonly known as: LOTRISONE   Apply topically 2 (two) times daily as needed (Rash).      diphenhydrAMINE 25 MG tablet   Commonly known as: BENADRYL   Take 25 mg by mouth at bedtime.      doxycycline 100 MG capsule   Commonly known as: MONODOX   Take 100 mg by mouth 2 (two) times daily. For 10 days      ferrous sulfate 325 (65 FE) MG tablet   Take 325 mg by mouth 3 (three) times daily with meals.      fluticasone 50 MCG/ACT nasal spray   Commonly known as: FLONASE   Place 1 spray into the nose 2 (two) times daily.      levothyroxine 50 MCG tablet   Commonly known as: SYNTHROID, LEVOTHROID   Take 50 mcg by mouth every morning.      NITROSTAT 0.4 MG SL tablet   Generic drug: nitroGLYCERIN   Place 0.4 mg under the tongue every 5 (five) minutes as needed.      rosuvastatin 10 MG tablet   Commonly known as: CRESTOR   Take 1 tablet  (10 mg total) by mouth at bedtime.      VITAMIN D PO   Take by mouth daily.            Discharge Condition: improved.  Disposition: 01-Home or Self Care   Consults: Franky Macho, M.D.     Microbiology: No results found for this or any previous visit (from the past 240 hour(s)).   Labs: Results for orders placed during the hospital encounter of 07/17/11 (from the past 48 hour(s))  GLUCOSE, CAPILLARY     Status: Normal   Collection Time   07/19/11  7:24 AM      Component Value Range Comment   Glucose-Capillary 91  70 - 99 (mg/dL)    Comment 1 Notify RN     OSMOLALITY     Status: Abnormal   Collection Time   07/19/11  8:41 AM      Component Value Range Comment   Osmolality 270 (*) 275 - 300 (mOsm/kg)   SODIUM, URINE, RANDOM     Status: Normal   Collection Time   07/19/11 11:23 AM      Component Value Range Comment   Sodium,  Ur 46     OSMOLALITY, URINE     Status: Abnormal   Collection Time   07/19/11 11:23 AM      Component Value Range Comment   Osmolality, Ur 171 (*) 390 - 1090 (mOsm/kg)   VANCOMYCIN, TROUGH     Status: Normal   Collection Time   07/20/11  5:49 AM      Component Value Range Comment   Vancomycin Tr 12.2  10.0 - 20.0 (ug/mL)   BASIC METABOLIC PANEL     Status: Abnormal   Collection Time   07/20/11  5:50 AM      Component Value Range Comment   Sodium 130 (*) 135 - 145 (mEq/L)    Potassium 3.9  3.5 - 5.1 (mEq/L)    Chloride 96  96 - 112 (mEq/L)    CO2 26  19 - 32 (mEq/L)    Glucose, Bld 93  70 - 99 (mg/dL)    BUN 4 (*) 6 - 23 (mg/dL)    Creatinine, Ser 1.61  0.50 - 1.10 (mg/dL)    Calcium 8.9  8.4 - 10.5 (mg/dL)    GFR calc non Af Amer 86 (*) >90 (mL/min)    GFR calc Af Amer >90  >90 (mL/min)   CBC     Status: Abnormal   Collection Time   07/20/11  5:50 AM      Component Value Range Comment   WBC 3.2 (*) 4.0 - 10.5 (K/uL)    RBC 3.73 (*) 3.87 - 5.11 (MIL/uL)    Hemoglobin 11.7 (*) 12.0 - 15.0 (g/dL)    HCT 09.6 (*) 04.5 - 46.0 (%)    MCV  91.2  78.0 - 100.0 (fL)    MCH 31.4  26.0 - 34.0 (pg)    MCHC 34.4  30.0 - 36.0 (g/dL)    RDW 40.9  81.1 - 91.4 (%)    Platelets 197  150 - 400 (K/uL)   GLUCOSE, CAPILLARY     Status: Normal   Collection Time   07/20/11  7:34 AM      Component Value Range Comment   Glucose-Capillary 83  70 - 99 (mg/dL)    Comment 1 Notify RN      Comment 2 Documented in Chart        HPI : The patient is a 77 year old woman with a history significant for Takotsubo cardiomyopathy and hypothyroidism, who presented to the emergency department on 07/17/2011 with a chief complaint of a gluteal abscess. The patient had been started on doxycycline by her primary care provider one day prior to her presentation. In the emergency department, she 77 was noted to be afebrile and hemodynamically stable. Her white blood cell count was within normal limits at 7.0. Her serum sodium was 127. Her sedimentation rate was 45. She was admitted for further evaluation and management.  HOSPITAL COURSE: the patient underwent local incision and drainage by ED physician, Dr. Preston Fleeting while the patient was in the emergency department. Apparently, no specimens were sent for culturing. She was given 1 dose of IV vancomycin in the emergency department. She was continued on IV vancomycin. Zosyn was added, however, because of her penicillin allergy it was discontinued. Instead, she was started on both Cipro and Primaxin for broader antibiotic coverage. General surgeon, Dr. Lovell Sheehan was consulted. He agreed with antibiotic management. He wrote for wound care changes. He did not see any immediate need to perform further surgical incision or drainage or debridement. The patient was started on gentle  IV fluids for treatment of hyponatremia. Sodium studies were ordered. They were not clearly indicative of SIADH or significant volume depletion. Her serum sodium did improve to 130 prior to discharge. Of note, she did receive hypotonic IV fluids with antibiotics  which probably contributed to ongoing mild hyponatremia.  The wound improved clinically. She had less gluteal discomfort. She had a surrounding rash that which may have been in part due to a fungal infection. She had been using clotrimazole/betamethasone cream  prior to this hospitalization. She was instructed to continue it as needed at home. She was also instructed on continued wound care. Dr. Lovell Sheehan instructed the patient to followup with him in one week.  Discharge Exam:  Blood pressure 121/66, pulse 70, temperature 97.9 F (36.6 C), temperature source Oral, resp. rate 18, height 5\' 7"  (1.702 m), weight 61.5 kg (135 lb 9.3 oz), SpO2 91.00%.  Lungs: Clear to auscultation bilaterally. Heart: S1, S2, with no murmurs rubs or gallops. Abdomen: Positive bowel sounds, soft, nontender, nondistended. Gluteal/buttocks. Right buttock/gluteal incision site with palpable induration, and now surrounding erythema. Minimal non-. Drainage. Extremities: No pedal edema.   Discharge Orders    Future Orders Please Complete By Expires   Diet - low sodium heart healthy      Increase activity slowly      Discharge instructions      Comments:   Continue antibiotics as previously prescribed for you were hospitalized.   Discharge wound care:      Comments:   Clean the buttock area gently with soap and water and cover with pad daily.      Follow-up Information    Follow up with Dalia Heading, MD. Schedule an appointment as soon as possible for a visit on 07/23/2011.   Contact information:   [address] [place] [address] [phone]       Follow up with Pamelia Hoit, MD. Schedule an appointment as soon as possible for a visit in 2 weeks.   Contact information:   [address] [place] [address] [phone]          Discharge time: 35 minutes.  Signed: Jori Thrall 07/20/2011, 11:10 AM

## 2011-07-20 NOTE — Evaluation (Signed)
Physical Therapy Evaluation Patient Details Name: ILEY DEIGNAN MRN: [id number] DOB: 19-May-1934 Today's Date: 07/20/2011 Time: 7846-9629 PT Time Calculation (min): 20 min  PT Assessment / Plan / Recommendation Clinical Impression  Pt is independent with all mobility.  No PT needed    PT Assessment  Patent does not need any further PT services    Follow Up Recommendations  No PT follow up    Barriers to Discharge        lEquipment Recommendations  None recommended by PT    Recommendations for Other Services     Frequency      Precautions / Restrictions Precautions Precautions: None Restrictions Weight Bearing Restrictions: No   Pertinent Vitals/Pain       Mobility  Bed Mobility Bed Mobility: Supine to Sit;Sit to Supine Supine to Sit: 7: Independent Sit to Supine: 7: Independent Transfers Transfers: Sit to Stand;Stand to Sit Sit to Stand: 7: Independent Stand to Sit: 7: Independent Ambulation/Gait Ambulation/Gait Assistance: 7: Independent Ambulation Distance (Feet): 300 Feet Assistive device: None Gait Pattern: Within Functional Limits Stairs: Yes Stairs Assistance: 7: Independent Stair Management Technique: One rail Right;Forwards Number of Stairs: 4  Wheelchair Mobility Wheelchair Mobility: No    Exercises     PT Diagnosis:    PT Problem List:   PT Treatment Interventions:     PT Goals    Visit Information  Last PT Received On: 07/20/11    Subjective Data  Subjective: ready to go home Patient Stated Goal: return home   Prior Functioning  Home Living Lives With: Spouse Available Help at Discharge: Family Type of Home: House Home Access: Stairs to enter Secretary/administrator of Steps: 5 Entrance Stairs-Rails: Right Home Layout: One level Bathroom Shower/Tub: Engineer, manufacturing systems: Standard Home Adaptive Equipment: None Prior Function Level of Independence: Independent Able to Take Stairs?: Yes Driving: Yes Vocation:  Retired Musician: No difficulties    Cognition  Overall Cognitive Status: Appears within functional limits for tasks assessed/performed Arousal/Alertness: Awake/alert Orientation Level: Appears intact for tasks assessed Behavior During Session: [facility] for tasks performed    Extremity/Trunk Assessment Right Upper Extremity Assessment RUE ROM/Strength/Tone: Within functional levels Left Upper Extremity Assessment LUE ROM/Strength/Tone: Within functional levels Right Lower Extremity Assessment RLE ROM/Strength/Tone: Within functional levels RLE Sensation: WFL - Light Touch;WFL - Proprioception RLE Coordination: WFL - gross motor Left Lower Extremity Assessment LLE ROM/Strength/Tone: Within functional levels LLE Sensation: WFL - Light Touch;WFL - Proprioception LLE Coordination: WFL - gross motor Trunk Assessment Trunk Assessment: Normal   Balance    End of Session PT - End of Session Equipment Utilized During Treatment: Gait belt Activity Tolerance: Patient tolerated treatment well Patient left: in bed   [place], Ragnar Waas L 07/20/2011, 11:00 AM

## 2011-07-22 NOTE — Progress Notes (Signed)
UR Chart Review Completed  

## 2012-02-09 ENCOUNTER — Other Ambulatory Visit: Payer: Self-pay | Admitting: *Deleted

## 2012-02-09 DIAGNOSIS — E785 Hyperlipidemia, unspecified: Secondary | ICD-10-CM

## 2012-02-09 MED ORDER — ROSUVASTATIN CALCIUM 10 MG PO TABS: 10.0000 mg | ORAL_TABLET | Freq: Every day | ORAL | Status: DC

## 2012-04-03 ENCOUNTER — Emergency Department (HOSPITAL_COMMUNITY)
Admission: EM | Admit: 2012-04-03 | Discharge: 2012-04-03 | Disposition: A | Discharge status: Home / Self Care | Payer: Medicare Other | Attending: Emergency Medicine | Admitting: Emergency Medicine

## 2012-04-03 ENCOUNTER — Emergency Department (HOSPITAL_COMMUNITY): Payer: Medicare Other

## 2012-04-03 ENCOUNTER — Encounter (HOSPITAL_COMMUNITY): Payer: Self-pay | Admitting: Emergency Medicine

## 2012-04-03 DIAGNOSIS — E039 Hypothyroidism, unspecified: Secondary | ICD-10-CM | POA: Insufficient documentation

## 2012-04-03 DIAGNOSIS — Z79899 Other long term (current) drug therapy: Secondary | ICD-10-CM | POA: Insufficient documentation

## 2012-04-03 DIAGNOSIS — Z8739 Personal history of other diseases of the musculoskeletal system and connective tissue: Secondary | ICD-10-CM | POA: Insufficient documentation

## 2012-04-03 DIAGNOSIS — M25559 Pain in unspecified hip: Secondary | ICD-10-CM | POA: Insufficient documentation

## 2012-04-03 DIAGNOSIS — G8911 Acute pain due to trauma: Secondary | ICD-10-CM | POA: Insufficient documentation

## 2012-04-03 DIAGNOSIS — Z8679 Personal history of other diseases of the circulatory system: Secondary | ICD-10-CM | POA: Insufficient documentation

## 2012-04-03 DIAGNOSIS — Z872 Personal history of diseases of the skin and subcutaneous tissue: Secondary | ICD-10-CM | POA: Insufficient documentation

## 2012-04-03 DIAGNOSIS — D649 Anemia, unspecified: Secondary | ICD-10-CM | POA: Insufficient documentation

## 2012-04-03 DIAGNOSIS — IMO0002 Reserved for concepts with insufficient information to code with codable children: Secondary | ICD-10-CM | POA: Insufficient documentation

## 2012-04-03 DIAGNOSIS — Z87891 Personal history of nicotine dependence: Secondary | ICD-10-CM | POA: Insufficient documentation

## 2012-04-03 MED ORDER — OXYCODONE-ACETAMINOPHEN 5-325 MG PO TABS: 1.0000 | ORAL_TABLET | ORAL | Status: DC | PRN

## 2012-04-03 MED ORDER — OXYCODONE-ACETAMINOPHEN 5-325 MG PO TABS: 1.0000 | ORAL_TABLET | Freq: Once | ORAL | Status: DC

## 2012-04-03 NOTE — ED Notes (Signed)
Patient transported to X-ray 

## 2012-04-03 NOTE — ED Notes (Signed)
C/o R hip pain since falling last Sunday.  Pain worse today. States she had negative back x-rays at PCP on Monday but hasn't had hip x-ray.

## 2012-04-03 NOTE — ED Provider Notes (Signed)
History    This chart was scribed for non-physician practitioner working with Gwyneth Sprout, MD by Leone Payor, ED Scribe. This patient was seen in room TR10C/TR10C and the patient's care was started at 1811.   CSN: [id number]  Arrival date & time 04/03/12  1811   None     Chief Complaint  Patient presents with  . Hip Pain     The history is provided by the patient. No language interpreter was used.    Charlene Vincent is a 78 y.o. female who presents to the Emergency Department complaining of ongoing, constant, gradually worsening right hip pain after a fall that occurred 1 week ago. Pt was seen by PCP where she had negative back xrays. Pt reports taking Vicodin which was prescribed for her back pain but states it makes her hip hurt.   Pt has h/o arthritis, costochondritis.  Pt is a former smoker but denies alcohol use.  Past Medical History  Diagnosis Date  . Takotsubo cardiomyopathy EF 60%- 12/2010    s/p cath July 2012. EF is 35 to 40%. 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 Hypothyroidism   . Anemia   . Costochondritis   . Arthritis   . Cellulitis     Past Surgical History  Procedure Laterality Date  . Inguinal hernia repair    . Cardiac catheterization  September 10, 2010    EF 35 to 40%. No significant CAD. ? takotsubo cardiomyopathy    Family History  Problem Relation Age of Onset  . Heart disease Father   . Heart disease Brother     History  Substance Use Topics  . Smoking status: Former Smoker -- 0.50 packs/day    Types: Cigarettes    Quit date: 09/07/2010  . Smokeless tobacco: Not on file  . Alcohol Use: No    No OB history provided.   Review of Systems A complete 10 system review of systems was obtained and all systems are negative except as noted in the HPI and PMH.   Allergies  Aspirin and Penicillins  Home Medications   Current Outpatient Rx  Name  Route  Sig  Dispense  Refill  . acetaminophen (TYLENOL) 325 MG tablet   Oral  Take 650 mg by mouth as needed. For pain         . cetirizine (ZYRTEC) 10 MG tablet   Oral   Take 10 mg by mouth every morning.          . Cholecalciferol (VITAMIN D PO)   Oral   Take by mouth daily.           . clotrimazole-betamethasone (LOTRISONE) cream   Topical   Apply topically 2 (two) times daily as needed (Rash).   30 g      . diphenhydrAMINE (BENADRYL) 25 MG tablet   Oral   Take 25 mg by mouth at bedtime.         Marland Kitchen doxycycline (MONODOX) 100 MG capsule   Oral   Take 100 mg by mouth 2 (two) times daily. For 10 days         . ferrous sulfate 325 (65 FE) MG tablet   Oral   Take 325 mg by mouth 3 (three) times daily with meals.           . fluticasone (FLONASE) 50 MCG/ACT nasal spray   Nasal   Place 1 spray into the nose 2 (two) times daily.          Marland Kitchen  levothyroxine (SYNTHROID, LEVOTHROID) 50 MCG tablet   Oral   Take 50 mcg by mouth every morning.          . Multiple Vitamins-Minerals (CENTRUM SILVER) tablet   Oral   Take 1 tablet by mouth daily.         Marland Kitchen NITROSTAT 0.4 MG SL tablet   Sublingual   Place 0.4 mg under the tongue every 5 (five) minutes as needed.          . rosuvastatin (CRESTOR) 10 MG tablet   Oral   Take 1 tablet (10 mg total) by mouth at bedtime.   30 tablet   3     BP 160/78  Pulse 90  Temp(Src) 98.4 F (36.9 C) (Oral)  SpO2 96%  Physical Exam  Nursing note and vitals reviewed. Constitutional: She is oriented to person, place, and time. She appears well-developed and well-nourished. No distress.  HENT:  Head: Normocephalic and atraumatic.  Eyes: EOM are normal.  Neck: Neck supple. No tracheal deviation present.  Cardiovascular: Normal rate.   Pulmonary/Chest: Effort normal. No respiratory distress.  Musculoskeletal: Normal range of motion.  Right posterior iliac crest tender to palpation. No obvious deformity. No edema. Full ROM of right hip.   Neurological: She is alert and oriented to person, place, and  time.  Strength and sensation equal and intact.   Skin: Skin is warm and dry.  No bruising.   Psychiatric: She has a normal mood and affect. Her behavior is normal.    ED Course  Procedures (including critical care time)  DIAGNOSTIC STUDIES: Oxygen Saturation is 96% on room air, adequate by my interpretation.    COORDINATION OF CARE:  8:28 PM Discussed treatment plan which includes imaging of right hip with pt at bedside and pt agreed to plan.    Labs Reviewed - No data to display Dg Pelvis 1-2 Views  04/03/2012  *RADIOLOGY REPORT*  Clinical Data: Status post fall; right hip pain.  PELVIS - 1-2 VIEW  Comparison: Right hip radiographs performed 03/28/2012  Findings: There is no evidence of fracture or dislocation.  Both femoral heads are seated normally within their respective acetabula.  Mild degenerative change is noted at the lower lumbar spine.  The sacroiliac joints are unremarkable in appearance.  The visualized bowel gas pattern is grossly unremarkable in appearance.  Scattered phleboliths are noted within the pelvis.  IMPRESSION: No evidence of fracture or dislocation.   Original Report Authenticated By: Tonia Ghent, M.D.      1. Hip pain       MDM  9:21 PM Xray unremarkable for acute changes. Patient will be discharged with Percocet for pain. Patient instructed to follow up with PCP for further evaluation and management.      I personally performed the services described in this documentation, which was scribed in my presence. The recorded information has been reviewed and is accurate.   Emilia Beck, PA-C 04/03/12 2122

## 2012-04-04 NOTE — ED Provider Notes (Signed)
Medical screening examination/treatment/procedure(s) were performed by non-physician practitioner and as supervising physician I was immediately available for consultation/collaboration.   Gwyneth Sprout, MD 04/04/12 0010

## 2012-04-20 ENCOUNTER — Other Ambulatory Visit (HOSPITAL_COMMUNITY): Payer: Self-pay | Admitting: Family Medicine

## 2012-04-20 DIAGNOSIS — Z1231 Encounter for screening mammogram for malignant neoplasm of breast: Secondary | ICD-10-CM

## 2012-05-27 ENCOUNTER — Ambulatory Visit (HOSPITAL_COMMUNITY)
Admission: RE | Admit: 2012-05-27 | Discharge: 2012-05-27 | Disposition: A | Discharge status: Home / Self Care | Payer: Medicare Other | Source: Ambulatory Visit | Attending: Family Medicine | Admitting: Family Medicine

## 2012-05-27 DIAGNOSIS — Z1231 Encounter for screening mammogram for malignant neoplasm of breast: Secondary | ICD-10-CM

## 2012-06-08 ENCOUNTER — Other Ambulatory Visit: Payer: Self-pay

## 2012-06-08 DIAGNOSIS — E785 Hyperlipidemia, unspecified: Secondary | ICD-10-CM

## 2012-06-08 MED ORDER — ROSUVASTATIN CALCIUM 10 MG PO TABS: 10.0000 mg | ORAL_TABLET | Freq: Every day | ORAL | Status: DC

## 2012-09-06 ENCOUNTER — Other Ambulatory Visit: Payer: Self-pay | Admitting: *Deleted

## 2012-09-06 DIAGNOSIS — E785 Hyperlipidemia, unspecified: Secondary | ICD-10-CM

## 2012-09-06 MED ORDER — ROSUVASTATIN CALCIUM 10 MG PO TABS: 10.0000 mg | ORAL_TABLET | Freq: Every day | ORAL | Status: DC

## 2012-09-06 NOTE — Telephone Encounter (Signed)
Refill complete 

## 2013-02-01 ENCOUNTER — Other Ambulatory Visit: Payer: Self-pay | Admitting: *Deleted

## 2013-02-01 DIAGNOSIS — E785 Hyperlipidemia, unspecified: Secondary | ICD-10-CM

## 2013-02-01 MED ORDER — ROSUVASTATIN CALCIUM 10 MG PO TABS: 10.0000 mg | ORAL_TABLET | Freq: Every day | ORAL | Status: DC

## 2013-03-06 ENCOUNTER — Encounter: Payer: Self-pay | Admitting: Cardiology

## 2013-03-06 ENCOUNTER — Ambulatory Visit (INDEPENDENT_AMBULATORY_CARE_PROVIDER_SITE_OTHER): Payer: Medicare Other | Admitting: Cardiology

## 2013-03-06 VITALS — BP 140/80 | HR 78 | Ht 67.0 in | Wt 137.0 lb

## 2013-03-06 DIAGNOSIS — I10 Essential (primary) hypertension: Secondary | ICD-10-CM

## 2013-03-06 DIAGNOSIS — I5181 Takotsubo syndrome: Secondary | ICD-10-CM | POA: Insufficient documentation

## 2013-03-06 DIAGNOSIS — E785 Hyperlipidemia, unspecified: Secondary | ICD-10-CM

## 2013-03-06 MED ORDER — ROSUVASTATIN CALCIUM 10 MG PO TABS: 10.0000 mg | ORAL_TABLET | Freq: Every day | ORAL | Status: DC

## 2013-03-06 NOTE — Patient Instructions (Signed)
Continue your current therapy  I will see you as needed. 

## 2013-03-06 NOTE — Progress Notes (Signed)
    Charlene Vincent Date of Birth: [birth year]   History of Present Illness: Charlene Vincent is seen back today for a followup visit. She has a history of takotsubo-cardiomyopathy in July 2012. Subsequent Echo showed normalization of LV function. She has been feeling very well. She denies any recurrent chest pain, shortness of breath, edema, or palpitations. She states her activity level is normal.  Current Outpatient Prescriptions on File Prior to Visit  Medication Sig Dispense Refill  . cetirizine (ZYRTEC) 10 MG tablet Take 10 mg by mouth every morning.       . fluticasone (FLONASE) 50 MCG/ACT nasal spray Place 1 spray into the nose 2 (two) times daily.       Marland Kitchen levothyroxine (SYNTHROID, LEVOTHROID) 50 MCG tablet Take 50 mcg by mouth every morning.       . Multiple Vitamins-Minerals (CENTRUM SILVER) tablet Take 1 tablet by mouth daily.       No current facility-administered medications on file prior to visit.    Allergies  Allergen Reactions  . Aspirin     But taking low dose therapy without problems.  . Penicillins Rash    Past Medical History  Diagnosis Date  . Takotsubo cardiomyopathy EF 60%- 12/2010    s/p cath July 2012. EF is 35 to 40%. 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 Hypothyroidism   . Anemia   . Costochondritis   . Arthritis   . Cellulitis     Past Surgical History  Procedure Laterality Date  . Inguinal hernia repair    . Cardiac catheterization  September 10, 2010    EF 35 to 40%. No significant CAD. ? takotsubo cardiomyopathy    History  Smoking status  . Former Smoker -- 0.50 packs/day  . Types: Cigarettes  . Quit date: 09/07/2010  Smokeless tobacco  . Not on file    History  Alcohol Use No    Family History  Problem Relation Age of Onset  . Heart disease Father   . Heart disease Brother     Review of Systems: As noted in history of present illness. All other systems were reviewed and are negative.  Physical Exam: BP 140/80  Pulse 78   Ht 5\' 7"  (1.702 m)  Wt 137 lb (62.143 kg)  BMI 21.45 kg/m2 Patient is pleasant and in no acute distress. Skin is warm and dry. Color is normal. She is ecchymotic.  HEENT is unremarkable. Normocephalic/atraumatic. PERRL. Sclera are nonicteric.   No masses. No JVD. Lungs are clear. Cardiac exam shows a regular rate and rhythm. Abdomen is soft. Extremities are without edema.  Gait and ROM are intact. No gross neurologic deficits noted.  LABORATORY DATA: Ecg: NSR, normal   Assessment / Plan: 1. History of Takotsubo cardiomyopathy. Resolved. No recurrent symptoms. I reviewed her excellent prognosis and low risk of recurrence. I will see her as needed.

## 2013-03-24 ENCOUNTER — Ambulatory Visit: Payer: Medicare Other | Admitting: Cardiology

## 2013-04-18 ENCOUNTER — Other Ambulatory Visit (HOSPITAL_COMMUNITY): Payer: Self-pay | Admitting: Family Medicine

## 2013-04-18 DIAGNOSIS — Z1231 Encounter for screening mammogram for malignant neoplasm of breast: Secondary | ICD-10-CM

## 2013-05-02 ENCOUNTER — Emergency Department (HOSPITAL_COMMUNITY)
Admission: EM | Admit: 2013-05-02 | Discharge: 2013-05-02 | Disposition: A | Discharge status: Home / Self Care | Payer: Medicare Other | Attending: Emergency Medicine | Admitting: Emergency Medicine

## 2013-05-02 ENCOUNTER — Encounter (HOSPITAL_COMMUNITY): Payer: Self-pay | Admitting: Emergency Medicine

## 2013-05-02 DIAGNOSIS — Z8679 Personal history of other diseases of the circulatory system: Secondary | ICD-10-CM | POA: Insufficient documentation

## 2013-05-02 DIAGNOSIS — B029 Zoster without complications: Secondary | ICD-10-CM

## 2013-05-02 DIAGNOSIS — Z872 Personal history of diseases of the skin and subcutaneous tissue: Secondary | ICD-10-CM | POA: Insufficient documentation

## 2013-05-02 DIAGNOSIS — IMO0002 Reserved for concepts with insufficient information to code with codable children: Secondary | ICD-10-CM | POA: Insufficient documentation

## 2013-05-02 DIAGNOSIS — Z8739 Personal history of other diseases of the musculoskeletal system and connective tissue: Secondary | ICD-10-CM | POA: Insufficient documentation

## 2013-05-02 DIAGNOSIS — Z862 Personal history of diseases of the blood and blood-forming organs and certain disorders involving the immune mechanism: Secondary | ICD-10-CM | POA: Insufficient documentation

## 2013-05-02 DIAGNOSIS — E039 Hypothyroidism, unspecified: Secondary | ICD-10-CM | POA: Insufficient documentation

## 2013-05-02 DIAGNOSIS — R109 Unspecified abdominal pain: Secondary | ICD-10-CM | POA: Insufficient documentation

## 2013-05-02 DIAGNOSIS — Z880 Allergy status to penicillin: Secondary | ICD-10-CM | POA: Insufficient documentation

## 2013-05-02 DIAGNOSIS — Z87891 Personal history of nicotine dependence: Secondary | ICD-10-CM | POA: Insufficient documentation

## 2013-05-02 DIAGNOSIS — Z79899 Other long term (current) drug therapy: Secondary | ICD-10-CM | POA: Insufficient documentation

## 2013-05-02 DIAGNOSIS — Z9889 Other specified postprocedural states: Secondary | ICD-10-CM | POA: Insufficient documentation

## 2013-05-02 MED ORDER — ACYCLOVIR 800 MG PO TABS
ORAL_TABLET | ORAL | Status: AC
  Filled 2013-05-02: qty 1

## 2013-05-02 MED ORDER — ACYCLOVIR 200 MG PO CAPS
400.0000 mg | ORAL_CAPSULE | Freq: Once | ORAL | Status: AC
  Administered 2013-05-02: 400 mg via ORAL
  Filled 2013-05-02: qty 2

## 2013-05-02 MED ORDER — HYDROCODONE-ACETAMINOPHEN 5-325 MG PO TABS
1.0000 | ORAL_TABLET | Freq: Once | ORAL | Status: AC
  Administered 2013-05-02: 1 via ORAL
  Filled 2013-05-02: qty 1

## 2013-05-02 MED ORDER — HYDROCODONE-ACETAMINOPHEN 5-325 MG PO TABS: 1.0000 | ORAL_TABLET | Freq: Four times a day (QID) | ORAL | Status: DC | PRN

## 2013-05-02 MED ORDER — ACYCLOVIR 400 MG PO TABS: 400.0000 mg | ORAL_TABLET | Freq: Four times a day (QID) | ORAL | Status: DC

## 2013-05-02 NOTE — ED Provider Notes (Signed)
CSN: [id number]     Arrival date & time 05/02/13  0143 History   First MD Initiated Contact with Patient 05/02/13 0224     Chief Complaint  Patient presents with  . Rash  . Flank Pain     (Consider location/radiation/quality/duration/timing/severity/associated sxs/prior Treatment) HPI History provided by patient. Left flank pain onset 2 days ago sharp and burning followed by rash to that area. Pain has been mild to moderate in severity. No fevers or chills. No cough or shortness of breath. Patient unsure if she has received the shingles vaccine, and denies history of shingles. No abdominal pain otherwise. No difficulty urinating. No known alleviating factors. Pain became more severe tonight.  Past Medical History  Diagnosis Date  . Takotsubo cardiomyopathy EF 60%- 12/2010    s/p cath July 2012. EF is 35 to 40%. 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 Hypothyroidism   . Anemia   . Costochondritis   . Arthritis   . Cellulitis    Past Surgical History  Procedure Laterality Date  . Inguinal hernia repair    . Cardiac catheterization  September 10, 2010    EF 35 to 40%. No significant CAD. ? takotsubo cardiomyopathy   Family History  Problem Relation Age of Onset  . Heart disease Father   . Heart disease Brother    History  Substance Use Topics  . Smoking status: Former Smoker -- 0.50 packs/day    Types: Cigarettes    Quit date: 09/07/2010  . Smokeless tobacco: Not on file  . Alcohol Use: No   OB History   Grav Para Term Preterm Abortions TAB SAB Ect Mult Living                 Review of Systems  Constitutional: Negative for fever and chills.  Eyes: Negative for visual disturbance.  Respiratory: Negative for shortness of breath.   Cardiovascular: Negative for chest pain.  Gastrointestinal: Negative for abdominal pain.  Genitourinary: Positive for flank pain. Negative for dysuria.  Musculoskeletal: Negative for joint swelling.  Skin: Positive for rash.   Neurological: Negative for headaches.  All other systems reviewed and are negative.      Allergies  Aspirin and Penicillins  Home Medications   Current Outpatient Rx  Name  Route  Sig  Dispense  Refill  . cetirizine (ZYRTEC) 10 MG tablet   Oral   Take 10 mg by mouth every morning.          . fluticasone (FLONASE) 50 MCG/ACT nasal spray   Nasal   Place 1 spray into the nose 2 (two) times daily.          Marland Kitchen. levothyroxine (SYNTHROID, LEVOTHROID) 50 MCG tablet   Oral   Take 50 mcg by mouth every morning.          . Multiple Vitamins-Minerals (CENTRUM SILVER) tablet   Oral   Take 1 tablet by mouth daily.         . rosuvastatin (CRESTOR) 10 MG tablet   Oral   Take 1 tablet (10 mg total) by mouth at bedtime.   30 tablet   1     Call PCP for future refills    BP 143/73  Pulse 80  Temp(Src) 97.7 F (36.5 C) (Oral)  Resp 18  Ht 5\' 7"  (1.702 m)  Wt 134 lb (60.782 kg)  BMI 20.98 kg/m2  SpO2 100% Physical Exam  Constitutional: She is oriented to person, place, and time. She appears  well-developed and well-nourished.  HENT:  Head: Normocephalic and atraumatic.  Eyes: EOM are normal. Pupils are equal, round, and reactive to light.  Neck: Neck supple.  Cardiovascular: Normal rate, regular rhythm and intact distal pulses.   Pulmonary/Chest: Effort normal and breath sounds normal. No respiratory distress.  Abdominal: Soft. Bowel sounds are normal. She exhibits no distension. There is no tenderness.  Musculoskeletal: Normal range of motion. She exhibits no edema.  Erythematous patchy and vesicular rash to left flank, follows dermatome, does not cross midline and is consistent with herpes zoster.   Neurological: She is alert and oriented to person, place, and time.  Skin: Skin is warm and dry.    ED Course  Procedures (including critical care time) Labs Review Labs Reviewed - No data to display Imaging Review No results found.  Acyclovir your and hydrocodone  provided  Plan discharge home with prescriptions for the same. Plan close primary care followup. Zoster precautions provided. Patient and her husband state understanding all instructions.   MDM   Diagnosis: Herpes zoster  Treated with antiviral and pain medications. Vital signs and nurses notes reviewed and considered   Sunnie Nielsen, MD 05/02/13 [phone]

## 2013-05-02 NOTE — Discharge Instructions (Signed)
Shingles Shingles (herpes zoster) is an infection that is caused by the same virus that causes chickenpox (varicella). The infection causes a painful skin rash and fluid-filled blisters, which eventually break open, crust over, and heal. It may occur in any area of the body, but it usually affects only one side of the body or face. The pain of shingles usually lasts about 1 month. However, some people with shingles may develop long-term (chronic) pain in the affected area of the body. Shingles often occurs many years after the person had chickenpox. It is more common:  In people older than 50 years.  In people with weakened immune systems, such as those with HIV, AIDS, or cancer.  In people taking medicines that weaken the immune system, such as transplant medicines.  In people under great stress. CAUSES  Shingles is caused by the varicella zoster virus (VZV), which also causes chickenpox. After a person is infected with the virus, it can remain in the person's body for years in an inactive state (dormant). To cause shingles, the virus reactivates and breaks out as an infection in a nerve root. The virus can be spread from person to person (contagious) through contact with open blisters of the shingles rash. It will only spread to people who have not had chickenpox. When these people are exposed to the virus, they may develop chickenpox. They will not develop shingles. Once the blisters scab over, the person is no longer contagious and cannot spread the virus to others. SYMPTOMS  Shingles shows up in stages. The initial symptoms may be pain, itching, and tingling in an area of the skin. This pain is usually described as burning, stabbing, or throbbing.In a few days or weeks, a painful red rash will appear in the area where the pain, itching, and tingling were felt. The rash is usually on one side of the body in a band or belt-like pattern. Then, the rash usually turns into fluid-filled blisters. They  will scab over and dry up in approximately 2 3 weeks. Flu-like symptoms may also occur with the initial symptoms, the rash, or the blisters. These may include:  Fever.  Chills.  Headache.  Upset stomach. DIAGNOSIS  Your caregiver will perform a skin exam to diagnose shingles. Skin scrapings or fluid samples may also be taken from the blisters. This sample will be examined under a microscope or sent to a lab for further testing. TREATMENT  There is no specific cure for shingles. Your caregiver will likely prescribe medicines to help you manage the pain, recover faster, and avoid long-term problems. This may include antiviral drugs, anti-inflammatory drugs, and pain medicines. HOME CARE INSTRUCTIONS   Take a cool bath or apply cool compresses to the area of the rash or blisters as directed. This may help with the pain and itching.   Only take over-the-counter or prescription medicines as directed by your caregiver.   Rest as directed by your caregiver.  Keep your rash and blisters clean with mild soap and cool water or as directed by your caregiver.  Do not pick your blisters or scratch your rash. Apply an anti-itch cream or numbing creams to the affected area as directed by your caregiver.  Keep your shingles rash covered with a loose bandage (dressing).  Avoid skin contact with:  Babies.   Pregnant women.   Children with eczema.   Elderly people with transplants.   People with chronic illnesses, such as leukemia or AIDS.   Wear loose-fitting clothing to help ease   the pain of material rubbing against the rash.  Keep all follow-up appointments with your caregiver.If the area involved is on your face, you may receive a referral for follow-up to a specialist, such as an eye doctor (ophthalmologist) or an ear, nose, and throat (ENT) doctor. Keeping all follow-up appointments will help you avoid eye complications, chronic pain, or disability.  SEEK IMMEDIATE MEDICAL  CARE IF:   You have facial pain, pain around the eye area, or loss of feeling on one side of your face.  You have ear pain or ringing in your ear.  You have loss of taste.  Your pain is not relieved with prescribed medicines.   Your redness or swelling spreads.   You have more pain and swelling.  Your condition is worsening or has changed.   You have a feveror persistent symptoms for more than 2 3 days.  You have a fever and your symptoms suddenly get worse. MAKE SURE YOU:  Understand these instructions.  Will watch your condition.  Will get help right away if you are not doing well or get worse. Document Released: 02/09/2005 Document Revised: 11/04/2011 Document Reviewed: 09/24/2011 [employer] Patient Information 2014 [employer].  

## 2013-05-02 NOTE — ED Notes (Signed)
Pt states woke tonight and having left side pain & a rash to her back.

## 2013-05-26 ENCOUNTER — Encounter (HOSPITAL_COMMUNITY): Payer: Self-pay | Admitting: Emergency Medicine

## 2013-05-26 ENCOUNTER — Emergency Department (HOSPITAL_COMMUNITY)
Admission: EM | Admit: 2013-05-26 | Discharge: 2013-05-26 | Disposition: A | Discharge status: Home / Self Care | Payer: Medicare Other | Attending: Emergency Medicine | Admitting: Emergency Medicine

## 2013-05-26 DIAGNOSIS — Y93B9 Activity, other involving muscle strengthening exercises: Secondary | ICD-10-CM | POA: Insufficient documentation

## 2013-05-26 DIAGNOSIS — Z862 Personal history of diseases of the blood and blood-forming organs and certain disorders involving the immune mechanism: Secondary | ICD-10-CM | POA: Insufficient documentation

## 2013-05-26 DIAGNOSIS — Z8619 Personal history of other infectious and parasitic diseases: Secondary | ICD-10-CM | POA: Insufficient documentation

## 2013-05-26 DIAGNOSIS — X503XXA Overexertion from repetitive movements, initial encounter: Secondary | ICD-10-CM | POA: Insufficient documentation

## 2013-05-26 DIAGNOSIS — Y929 Unspecified place or not applicable: Secondary | ICD-10-CM | POA: Insufficient documentation

## 2013-05-26 DIAGNOSIS — Z880 Allergy status to penicillin: Secondary | ICD-10-CM | POA: Insufficient documentation

## 2013-05-26 DIAGNOSIS — Z79899 Other long term (current) drug therapy: Secondary | ICD-10-CM | POA: Insufficient documentation

## 2013-05-26 DIAGNOSIS — E039 Hypothyroidism, unspecified: Secondary | ICD-10-CM | POA: Insufficient documentation

## 2013-05-26 DIAGNOSIS — Z87891 Personal history of nicotine dependence: Secondary | ICD-10-CM | POA: Insufficient documentation

## 2013-05-26 DIAGNOSIS — Z9889 Other specified postprocedural states: Secondary | ICD-10-CM | POA: Insufficient documentation

## 2013-05-26 DIAGNOSIS — IMO0002 Reserved for concepts with insufficient information to code with codable children: Secondary | ICD-10-CM | POA: Insufficient documentation

## 2013-05-26 DIAGNOSIS — Z872 Personal history of diseases of the skin and subcutaneous tissue: Secondary | ICD-10-CM | POA: Insufficient documentation

## 2013-05-26 DIAGNOSIS — T148XXA Other injury of unspecified body region, initial encounter: Secondary | ICD-10-CM

## 2013-05-26 DIAGNOSIS — M129 Arthropathy, unspecified: Secondary | ICD-10-CM | POA: Insufficient documentation

## 2013-05-26 HISTORY — DX: Zoster without complications: B02.9

## 2013-05-26 MED ORDER — DIAZEPAM 2 MG PO TABS: 2.0000 mg | ORAL_TABLET | Freq: Three times a day (TID) | ORAL | Status: DC | PRN

## 2013-05-26 MED ORDER — DIAZEPAM 2 MG PO TABS
2.0000 mg | ORAL_TABLET | Freq: Once | ORAL | Status: AC
  Administered 2013-05-26: 2 mg via ORAL
  Filled 2013-05-26: qty 1

## 2013-05-26 NOTE — ED Provider Notes (Signed)
CSN: [id number]     Arrival date & time 05/26/13  1938 History  This chart was scribed for Charlene Baker, MD by Dorothey Baseman, ED Scribe. This patient was seen in room APA04/AP[facility]04 and the patient's care was started at 8:37 PM.    Chief Complaint  Patient presents with  . Back Pain   The history is provided by the patient. No language interpreter was used.   HPI Comments: Charlene Vincent is a 79 y.o. female who presents to the Emergency Department complaining of a constant, non-radiating pain to the bilateral sides of the mid and lower back onset a few hours ago while doing back exercises. Patient denies any known injury or trauma to the area. She states that the pain is exacerbated with movement. She reports taking Tylenol and Vicodin at home without significant relief. She denies hematuria. Patient has allergies to aspirin and penicillins. Patient has a history of Takotsubo cardiomyopathy, hypothyroidism, anemia, costochondritis, arthritis.   Past Medical History  Diagnosis Date  . Takotsubo cardiomyopathy EF 60%- 12/2010    s/p cath July 2012. EF is 35 to 40%. 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 Hypothyroidism   . Anemia   . Costochondritis   . Arthritis   . Cellulitis   . Shingles    Past Surgical History  Procedure Laterality Date  . Inguinal hernia repair    . Cardiac catheterization  September 10, 2010    EF 35 to 40%. No significant CAD. ? takotsubo cardiomyopathy   Family History  Problem Relation Age of Onset  . Heart disease Father   . Heart disease Brother    History  Substance Use Topics  . Smoking status: Former Smoker -- 0.50 packs/day    Types: Cigarettes    Quit date: 09/07/2010  . Smokeless tobacco: Not on file  . Alcohol Use: No   OB History   Grav Para Term Preterm Abortions TAB SAB Ect Mult Living                 Review of Systems  Genitourinary: Negative for hematuria.  Musculoskeletal: Positive for back pain.  All other systems reviewed  and are negative.      Allergies  Aspirin and Penicillins  Home Medications   Current Outpatient Rx  Name  Route  Sig  Dispense  Refill  . acetaminophen (TYLENOL) 500 MG tablet   Oral   Take 500 mg by mouth every 6 (six) hours as needed for mild pain or moderate pain.         . cetirizine (ZYRTEC) 10 MG tablet   Oral   Take 10 mg by mouth every morning.          . fluticasone (FLONASE) 50 MCG/ACT nasal spray   Nasal   Place 1 spray into the nose 2 (two) times daily.          Marland Kitchen HYDROcodone-acetaminophen (NORCO/VICODIN) 5-325 MG per tablet   Oral   Take 1 tablet by mouth every morning.         Marland Kitchen levothyroxine (SYNTHROID, LEVOTHROID) 50 MCG tablet   Oral   Take 50 mcg by mouth every morning.          . rosuvastatin (CRESTOR) 10 MG tablet   Oral   Take 1 tablet (10 mg total) by mouth at bedtime.   30 tablet   1     Call PCP for future refills    Triage Vitals: BP 140/66  Pulse 58  Temp(Src) 97.7 F (36.5 C) (Oral)  Resp 18  Ht 5\' 7"  (1.702 m)  Wt 142 lb (64.411 kg)  BMI 22.24 kg/m2  SpO2 97%  Physical Exam  Nursing note and vitals reviewed. Constitutional: She is oriented to person, place, and time. She appears well-developed and well-nourished.  Non-toxic appearance. No distress.  HENT:  Head: Normocephalic and atraumatic.  Eyes: Conjunctivae, EOM and lids are normal. Pupils are equal, round, and reactive to light.  Neck: Normal range of motion. Neck supple. No tracheal deviation present. No mass present.  Cardiovascular: Normal rate, regular rhythm and normal heart sounds.  Exam reveals no gallop.   No murmur heard. Pulmonary/Chest: Effort normal and breath sounds normal. No stridor. No respiratory distress. She has no decreased breath sounds. She has no wheezes. She has no rhonchi. She has no rales.  Abdominal: Soft. Normal appearance and bowel sounds are normal. She exhibits no distension. There is no tenderness. There is no rebound and no CVA  tenderness.  Musculoskeletal: Normal range of motion. She exhibits no edema and no tenderness.  No pain with straight leg raise. Pain to the mid-thoracic paraspinal muscles.   Neurological: She is alert and oriented to person, place, and time. She has normal strength and normal reflexes. She displays normal reflexes. No cranial nerve deficit or sensory deficit. GCS eye subscore is 4. GCS verbal subscore is 5. GCS motor subscore is 6.  Reflex Scores:      Patellar reflexes are 2+ on the right side and 2+ on the left side. Skin: Skin is warm and dry. No abrasion and no rash noted.  Psychiatric: She has a normal mood and affect. Her speech is normal and behavior is normal.    ED Course  Procedures (including critical care time)  DIAGNOSTIC STUDIES: Oxygen Saturation is 97% on room air, normal by my interpretation.    COORDINATION OF CARE: 8:41 PM- Discussed that symptoms are likely muscular in nature, so imaging will not be necessary today in the ED. Will start patient on muscle relaxants to manage symptoms. Discussed treatment plan with patient at bedside and patient verbalized agreement.     Labs Review Labs Reviewed - No data to display Imaging Review No results found.   EKG Interpretation None      MDM   Final diagnoses:  None      I personally performed the services described in this documentation, which was scribed in my presence. The recorded information has been reviewed and is accurate.      Charlene BakerAnthony T Shakiara Lukic, MD 05/26/13 2051

## 2013-05-26 NOTE — Discharge Instructions (Signed)

## 2013-05-26 NOTE — ED Notes (Signed)
Pt reporting pain in lower back, more laterally.  States that she did have shingles over 2 months ago and was informed to be checked if she developed back pain.

## 2013-05-29 ENCOUNTER — Ambulatory Visit (HOSPITAL_COMMUNITY)
Admission: RE | Admit: 2013-05-29 | Discharge: 2013-05-29 | Disposition: A | Discharge status: Home / Self Care | Payer: Medicare Other | Source: Ambulatory Visit | Attending: Family Medicine | Admitting: Family Medicine

## 2013-05-29 DIAGNOSIS — Z1231 Encounter for screening mammogram for malignant neoplasm of breast: Secondary | ICD-10-CM

## 2014-05-11 ENCOUNTER — Other Ambulatory Visit (HOSPITAL_COMMUNITY): Payer: Self-pay | Admitting: Family Medicine

## 2014-05-11 DIAGNOSIS — Z1231 Encounter for screening mammogram for malignant neoplasm of breast: Secondary | ICD-10-CM

## 2014-05-29 ENCOUNTER — Other Ambulatory Visit (HOSPITAL_COMMUNITY): Payer: Self-pay | Admitting: Family Medicine

## 2014-05-29 DIAGNOSIS — M810 Age-related osteoporosis without current pathological fracture: Secondary | ICD-10-CM

## 2014-05-30 ENCOUNTER — Other Ambulatory Visit (HOSPITAL_COMMUNITY): Payer: Self-pay | Admitting: Family Medicine

## 2014-05-30 DIAGNOSIS — Z1231 Encounter for screening mammogram for malignant neoplasm of breast: Secondary | ICD-10-CM

## 2014-05-31 ENCOUNTER — Ambulatory Visit (HOSPITAL_COMMUNITY): Payer: Self-pay

## 2014-06-07 ENCOUNTER — Ambulatory Visit (HOSPITAL_COMMUNITY)
Admission: RE | Admit: 2014-06-07 | Discharge: 2014-06-07 | Disposition: A | Discharge status: Home / Self Care | Payer: Medicare Other | Source: Ambulatory Visit | Attending: Family Medicine | Admitting: Family Medicine

## 2014-06-07 DIAGNOSIS — Z1231 Encounter for screening mammogram for malignant neoplasm of breast: Secondary | ICD-10-CM | POA: Diagnosis not present

## 2014-06-12 ENCOUNTER — Emergency Department (HOSPITAL_COMMUNITY): Payer: Medicare Other

## 2014-06-12 ENCOUNTER — Emergency Department (HOSPITAL_COMMUNITY)
Admission: EM | Admit: 2014-06-12 | Discharge: 2014-06-13 | Disposition: A | Discharge status: Home / Self Care | Payer: Medicare Other | Attending: Emergency Medicine | Admitting: Emergency Medicine

## 2014-06-12 ENCOUNTER — Encounter (HOSPITAL_COMMUNITY): Payer: Self-pay | Admitting: *Deleted

## 2014-06-12 DIAGNOSIS — Z7952 Long term (current) use of systemic steroids: Secondary | ICD-10-CM | POA: Insufficient documentation

## 2014-06-12 DIAGNOSIS — M1990 Unspecified osteoarthritis, unspecified site: Secondary | ICD-10-CM | POA: Diagnosis not present

## 2014-06-12 DIAGNOSIS — Z8619 Personal history of other infectious and parasitic diseases: Secondary | ICD-10-CM | POA: Diagnosis not present

## 2014-06-12 DIAGNOSIS — M25571 Pain in right ankle and joints of right foot: Secondary | ICD-10-CM | POA: Insufficient documentation

## 2014-06-12 DIAGNOSIS — R000 Tachycardia, unspecified: Secondary | ICD-10-CM | POA: Insufficient documentation

## 2014-06-12 DIAGNOSIS — Z872 Personal history of diseases of the skin and subcutaneous tissue: Secondary | ICD-10-CM | POA: Diagnosis not present

## 2014-06-12 DIAGNOSIS — Z862 Personal history of diseases of the blood and blood-forming organs and certain disorders involving the immune mechanism: Secondary | ICD-10-CM | POA: Diagnosis not present

## 2014-06-12 DIAGNOSIS — Z9889 Other specified postprocedural states: Secondary | ICD-10-CM | POA: Diagnosis not present

## 2014-06-12 DIAGNOSIS — Z79899 Other long term (current) drug therapy: Secondary | ICD-10-CM | POA: Insufficient documentation

## 2014-06-12 DIAGNOSIS — E039 Hypothyroidism, unspecified: Secondary | ICD-10-CM | POA: Diagnosis not present

## 2014-06-12 DIAGNOSIS — Z8679 Personal history of other diseases of the circulatory system: Secondary | ICD-10-CM | POA: Diagnosis not present

## 2014-06-12 DIAGNOSIS — Z8739 Personal history of other diseases of the musculoskeletal system and connective tissue: Secondary | ICD-10-CM | POA: Diagnosis not present

## 2014-06-12 DIAGNOSIS — Z880 Allergy status to penicillin: Secondary | ICD-10-CM | POA: Diagnosis not present

## 2014-06-12 DIAGNOSIS — Z87891 Personal history of nicotine dependence: Secondary | ICD-10-CM | POA: Insufficient documentation

## 2014-06-12 MED ORDER — ONDANSETRON HCL 4 MG PO TABS
4.0000 mg | ORAL_TABLET | Freq: Once | ORAL | Status: AC
  Administered 2014-06-12: 4 mg via ORAL
  Filled 2014-06-12: qty 1

## 2014-06-12 MED ORDER — HYDROCODONE-ACETAMINOPHEN 5-325 MG PO TABS
1.0000 | ORAL_TABLET | Freq: Once | ORAL | Status: AC
  Administered 2014-06-12: 1 via ORAL
  Filled 2014-06-12: qty 1

## 2014-06-12 NOTE — ED Notes (Signed)
EDP at bedside  

## 2014-06-12 NOTE — ED Provider Notes (Signed)
CSN: [id number]     Arrival date & time 06/12/14  2129 History   None    Chief Complaint  Patient presents with  . Ankle Pain     (Consider location/radiation/quality/duration/timing/severity/associated sxs/prior Treatment) Patient is a 80 y.o. female presenting with ankle pain. The history is provided by the patient.  Ankle Pain Location:  Ankle Ankle location:  R ankle Pain details:    Quality:  Aching   Radiates to: right heel.   Severity:  Moderate   Onset quality:  Gradual   Duration:  2 weeks   Timing:  Intermittent   Progression:  Worsening Chronicity:  Recurrent Dislocation: no   Relieved by:  Nothing Ineffective treatments:  Acetaminophen Associated symptoms: back pain   Risk factors: no recent illness     Past Medical History  Diagnosis Date  . Takotsubo cardiomyopathy EF 60%- 12/2010    s/p cath July 2012. EF is 35 to 40%. 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 Hypothyroidism   . Anemia   . Costochondritis   . Arthritis   . Cellulitis   . Shingles    Past Surgical History  Procedure Laterality Date  . Inguinal hernia repair    . Cardiac catheterization  September 10, 2010    EF 35 to 40%. No significant CAD. ? takotsubo cardiomyopathy   Family History  Problem Relation Age of Onset  . Heart disease Father   . Heart disease Brother    History  Substance Use Topics  . Smoking status: Former Smoker -- 0.50 packs/day    Types: Cigarettes    Quit date: 09/07/2010  . Smokeless tobacco: Not on file  . Alcohol Use: No   OB History    No data available     Review of Systems  Musculoskeletal: Positive for back pain and arthralgias.  All other systems reviewed and are negative.     Allergies  Aspirin and Penicillins  Home Medications   Prior to Admission medications   Medication Sig Start Date End Date Taking? Authorizing Provider  acetaminophen (TYLENOL) 500 MG tablet Take 500 mg by mouth every 6 (six) hours as needed for mild pain or  moderate pain.    Historical Provider, MD  cetirizine (ZYRTEC) 10 MG tablet Take 10 mg by mouth every morning.     Historical Provider, MD  diazepam (VALIUM) 2 MG tablet Take 1 tablet (2 mg total) by mouth every 8 (eight) hours as needed for muscle spasms. 05/26/13   Lorre Nick, MD  fluticasone (FLONASE) 50 MCG/ACT nasal spray Place 1 spray into the nose 2 (two) times daily.  09/03/10   Historical Provider, MD  HYDROcodone-acetaminophen (NORCO/VICODIN) 5-325 MG per tablet Take 1 tablet by mouth every morning. 05/02/13   Sunnie Nielsen, MD  levothyroxine (SYNTHROID, LEVOTHROID) 50 MCG tablet Take 50 mcg by mouth every morning.     Historical Provider, MD  rosuvastatin (CRESTOR) 10 MG tablet Take 1 tablet (10 mg total) by mouth at bedtime. 03/06/13 03/06/14  Peter M [place], MD   BP 135/88 mmHg  Pulse 63  Temp(Src) 98 F (36.7 C) (Oral)  Resp 20  Ht  (1.702 m)  Wt 130 lb (58.968 kg)  BMI 20.36 kg/m2  SpO2 96% Physical Exam  Constitutional: She is oriented to person, place, and time. She appears well-developed and well-nourished.  Non-toxic appearance.  HENT:  Head: Normocephalic.  Right Ear: Tympanic membrane and external ear normal.  Left Ear: Tympanic membrane and external ear  normal.  Eyes: EOM and lids are normal. Pupils are equal, round, and reactive to light.  Neck: Normal range of motion. Neck supple. Carotid bruit is not present.  Cardiovascular: Normal rate, regular rhythm, intact distal pulses and normal pulses.   Murmur heard. Pulmonary/Chest: Breath sounds normal. No respiratory distress.  Abdominal: Soft. Bowel sounds are normal. There is no tenderness. There is no guarding.  Musculoskeletal: Normal range of motion.  Lateral malleolus pain right ankle.. No hot joint.  No deformity.  Lymphadenopathy:       Head (right side): No submandibular adenopathy present.       Head (left side): No submandibular adenopathy present.    She has no cervical adenopathy.  Neurological:  She is alert and oriented to person, place, and time. She has normal strength. No cranial nerve deficit or sensory deficit.  Skin: Skin is warm and dry.  Psychiatric: She has a normal mood and affect. Her speech is normal.  Nursing note and vitals reviewed.   ED Course  Pt seen with me by Dr Wilkie Aye.  Procedures (including critical care time) Labs Review Labs Reviewed - No data to display  Imaging Review Dg Ankle Complete Right  06/12/2014   CLINICAL DATA:  Right ankle pain for 1 week with no known injury.  EXAM: RIGHT ANKLE - COMPLETE 3+ VIEW  COMPARISON:  None.  FINDINGS: There is no evidence of fracture, dislocation, or joint effusion. No significant degenerative joint narrowing. Osteopenia.  IMPRESSION: Negative.   Electronically Signed   By: Marnee Spring M.D.   On: 06/12/2014 22:14     EKG Interpretation None      MDM  X-ray of the right ankle is negative for fracture or dislocation. There are some degenerative changes present. Pain is mostly over the tibial talar tendon area.  The patient will be treated with an ankle stirrup splint. She is asked to continue her Tylenol for mild pain. She's given a prescription for Norco to use at bedtime only. We have discussed the use of excessive Tylenol, and patient expresses understanding of this instruction.    Final diagnoses:  None    *I have reviewed nursing notes, vital signs, and all appropriate lab and imaging results for this patient.[address], PA-C 06/13/14 0018  Shon Baton, MD 06/13/14 [phone]

## 2014-06-12 NOTE — ED Notes (Signed)
Pt states she has had right ankle pain. Pt states tonight her ankle started hurting worse. Pt states the pain starts in her right ankle and radiates into her back.

## 2014-06-13 MED ORDER — HYDROCODONE-ACETAMINOPHEN 5-325 MG PO TABS: ORAL_TABLET | ORAL | Status: DC

## 2014-06-13 NOTE — Discharge Instructions (Signed)
Please usually ankle splint when you are up and about. You do not have to sleep in this device. Continue your Tylenol for your ankle pain. Use Norco only at bedtime if needed for more severe pain. Ankle Pain Ankle pain is a common symptom. The bones, cartilage, tendons, and muscles of the ankle joint perform a lot of work each day. The ankle joint holds your body weight and allows you to move around. Ankle pain can occur on either side or back of 1 or both ankles. Ankle pain may be sharp and burning or dull and aching. There may be tenderness, stiffness, redness, or warmth around the ankle. The pain occurs more often when a person walks or puts pressure on the ankle. CAUSES  There are many reasons ankle pain can develop. It is important to work with your caregiver to identify the cause since many conditions can impact the bones, cartilage, muscles, and tendons. Causes for ankle pain include:  Injury, including a break (fracture), sprain, or strain often due to a fall, sports, or a high-impact activity.  Swelling (inflammation) of a tendon (tendonitis).  Achilles tendon rupture.  Ankle instability after repeated sprains and strains.  Poor foot alignment.  Pressure on a nerve (tarsal tunnel syndrome).  Arthritis in the ankle or the lining of the ankle.  Crystal formation in the ankle (gout or pseudogout). DIAGNOSIS  A diagnosis is based on your medical history, your symptoms, results of your physical exam, and results of diagnostic tests. Diagnostic tests may include X-ray exams or a computerized magnetic scan (magnetic resonance imaging, MRI). TREATMENT  Treatment will depend on the cause of your ankle pain and may include:  Keeping pressure off the ankle and limiting activities.  Using crutches or other walking support (a cane or brace).  Using rest, ice, compression, and elevation.  Participating in physical therapy or home exercises.  Wearing shoe inserts or special  shoes.  Losing weight.  Taking medications to reduce pain or swelling or receiving an injection.  Undergoing surgery. HOME CARE INSTRUCTIONS   Only take over-the-counter or prescription medicines for pain, discomfort, or fever as directed by your caregiver.  Put ice on the injured area.  Put ice in a plastic bag.  Place a towel between your skin and the bag.  Leave the ice on for 15-20 minutes at a time, 03-04 times a day.  Keep your leg raised (elevated) when possible to lessen swelling.  Avoid activities that cause ankle pain.  Follow specific exercises as directed by your caregiver.  Record how often you have ankle pain, the location of the pain, and what it feels like. This information may be helpful to you and your caregiver.  Ask your caregiver about returning to work or sports and whether you should drive.  Follow up with your caregiver for further examination, therapy, or testing as directed. SEEK MEDICAL CARE IF:   Pain or swelling continues or worsens beyond 1 week.  You have an oral temperature above 102 F (38.9 C).  You are feeling unwell or have chills.  You are having an increasingly difficult time with walking.  You have loss of sensation or other new symptoms.  You have questions or concerns. MAKE SURE YOU:   Understand these instructions.  Will watch your condition.  Will get help right away if you are not doing well or get worse. Document Released: 07/30/2009 Document Revised: 05/04/2011 Document Reviewed: 07/30/2009 [facility] Patient Information 2015 [place], [place]. This information is not intended to  replace advice given to you by your health care provider. Make sure you discuss any questions you have with your health care provider. ° °

## 2014-09-15 ENCOUNTER — Emergency Department (HOSPITAL_COMMUNITY)
Admission: EM | Admit: 2014-09-15 | Discharge: 2014-09-16 | Disposition: A | Discharge status: Home / Self Care | Payer: Medicare Other | Attending: Emergency Medicine | Admitting: Emergency Medicine

## 2014-09-15 ENCOUNTER — Encounter (HOSPITAL_COMMUNITY): Payer: Self-pay | Admitting: *Deleted

## 2014-09-15 DIAGNOSIS — Z880 Allergy status to penicillin: Secondary | ICD-10-CM | POA: Diagnosis not present

## 2014-09-15 DIAGNOSIS — M1990 Unspecified osteoarthritis, unspecified site: Secondary | ICD-10-CM | POA: Diagnosis not present

## 2014-09-15 DIAGNOSIS — Z8679 Personal history of other diseases of the circulatory system: Secondary | ICD-10-CM | POA: Insufficient documentation

## 2014-09-15 DIAGNOSIS — E039 Hypothyroidism, unspecified: Secondary | ICD-10-CM | POA: Diagnosis not present

## 2014-09-15 DIAGNOSIS — Z7951 Long term (current) use of inhaled steroids: Secondary | ICD-10-CM | POA: Insufficient documentation

## 2014-09-15 DIAGNOSIS — L299 Pruritus, unspecified: Secondary | ICD-10-CM | POA: Insufficient documentation

## 2014-09-15 DIAGNOSIS — Z79899 Other long term (current) drug therapy: Secondary | ICD-10-CM | POA: Diagnosis not present

## 2014-09-15 DIAGNOSIS — Z872 Personal history of diseases of the skin and subcutaneous tissue: Secondary | ICD-10-CM | POA: Diagnosis not present

## 2014-09-15 DIAGNOSIS — Z8619 Personal history of other infectious and parasitic diseases: Secondary | ICD-10-CM | POA: Diagnosis not present

## 2014-09-15 DIAGNOSIS — Z87891 Personal history of nicotine dependence: Secondary | ICD-10-CM | POA: Insufficient documentation

## 2014-09-15 DIAGNOSIS — Z9889 Other specified postprocedural states: Secondary | ICD-10-CM | POA: Diagnosis not present

## 2014-09-15 DIAGNOSIS — M2550 Pain in unspecified joint: Secondary | ICD-10-CM | POA: Insufficient documentation

## 2014-09-15 DIAGNOSIS — R21 Rash and other nonspecific skin eruption: Secondary | ICD-10-CM | POA: Diagnosis present

## 2014-09-15 NOTE — ED Notes (Signed)
Itching since 2100. Denies any no clothes, foods, meds, or detergents. Does go outside & walk several times each day.

## 2014-09-15 NOTE — ED Notes (Signed)
Patient developed itching on legs and arms tonight around 2000.  Has taken 2 allergy pills but it hasn't helped. Denies any changes in soaps, detergents, lotions.

## 2014-09-16 MED ORDER — HYDROXYZINE HCL 25 MG PO TABS
25.0000 mg | ORAL_TABLET | Freq: Once | ORAL | Status: AC
  Administered 2014-09-16: 25 mg via ORAL
  Filled 2014-09-16: qty 1

## 2014-09-16 MED ORDER — HYDROXYZINE HCL 10 MG PO TABS: ORAL_TABLET | ORAL | Status: DC

## 2014-09-16 NOTE — ED Notes (Signed)
Pt alert & oriented x4, stable gait. Patient  given discharge instructions, paperwork & prescription(s).  Patient verbalized understanding. Pt left department in wheelchair w/ no further questions. 

## 2014-09-16 NOTE — ED Provider Notes (Signed)
CSN: [id number]     Arrival date & time 09/15/14  2251 History   First MD Initiated Contact with Patient 09/15/14 2354     Chief Complaint  Patient presents with  . Pruritis     (Consider location/radiation/quality/duration/timing/severity/associated sxs/prior Treatment) HPI Comments: Patient is a 80 year old female who presents to the emergency department with history of anemia, costochondritis, arthritis, shingles, and Takotsubo Cardiomyopathy.   Patient states she is here tonight because of itching, and inability to go to sleep. The patient states that she has problems with itching during the day that is usually resolved by "rubbing alcohol". Patient states that at times she has to take Benadryl to rest at night, but usually if she has to take a second pill she can rest without problem. Tonight she has been unable to rest because of itching even after 2 Benadryl. The patient denies any new soaps, body splashes, body washes, new clothing, new foods, drinks, and no new medications.  No complaint of shortness of breath or difficulty with swallowing. She presents now for assistance with this issue.  The history is provided by the patient.    Past Medical History  Diagnosis Date  . Takotsubo cardiomyopathy EF 60%- 12/2010    s/p cath July 2012. EF is 35 to 40%. 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 Hypothyroidism   . Anemia   . Costochondritis   . Arthritis   . Cellulitis   . Shingles    Past Surgical History  Procedure Laterality Date  . Inguinal hernia repair    . Cardiac catheterization  September 10, 2010    EF 35 to 40%. No significant CAD. ? takotsubo cardiomyopathy   Family History  Problem Relation Age of Onset  . Heart disease Father   . Heart disease Brother    History  Substance Use Topics  . Smoking status: Former Smoker -- 0.50 packs/day    Types: Cigarettes    Quit date: 09/07/2010  . Smokeless tobacco: Not on file  . Alcohol Use: No   OB History    No  data available     Review of Systems  Musculoskeletal: Positive for arthralgias.  Skin: Positive for rash.  All other systems reviewed and are negative.     Allergies  Aspirin and Penicillins  Home Medications   Prior to Admission medications   Medication Sig Start Date End Date Taking? Authorizing Provider  acetaminophen (TYLENOL) 500 MG tablet Take 500 mg by mouth every 6 (six) hours as needed for mild pain or moderate pain.    Historical Provider, MD  cetirizine (ZYRTEC) 10 MG tablet Take 10 mg by mouth every morning.     Historical Provider, MD  diazepam (VALIUM) 2 MG tablet Take 1 tablet (2 mg total) by mouth every 8 (eight) hours as needed for muscle spasms. 05/26/13   Lorre Nick, MD  fluticasone (FLONASE) 50 MCG/ACT nasal spray Place 1 spray into the nose 2 (two) times daily.  09/03/10   Historical Provider, MD  HYDROcodone-acetaminophen (NORCO/VICODIN) 5-325 MG per tablet 1 po hs prn pain 06/13/14   Ivery Quale, PA-C  levothyroxine (SYNTHROID, LEVOTHROID) 50 MCG tablet Take 50 mcg by mouth every morning.     Historical Provider, MD  rosuvastatin (CRESTOR) 10 MG tablet Take 1 tablet (10 mg total) by mouth at bedtime. 03/06/13 03/06/14  Peter M [place], MD   BP 144/81 mmHg  Pulse 84  Temp(Src) 98 F (36.7 C) (Oral)  Resp 16  Ht  5\' 7"  (1.702 m)  Wt 132 lb (59.875 kg)  BMI 20.67 kg/m2  SpO2 96% Physical Exam  Constitutional: She is oriented to person, place, and time. She appears well-developed and well-nourished.  Non-toxic appearance.  HENT:  Head: Normocephalic.  Right Ear: Tympanic membrane and external ear normal.  Left Ear: Tympanic membrane and external ear normal.  Eyes: EOM and lids are normal. Pupils are equal, round, and reactive to light.  Neck: Normal range of motion. Neck supple. Carotid bruit is not present.  Cardiovascular: Normal rate, regular rhythm, normal heart sounds, intact distal pulses and normal pulses.   Pulmonary/Chest: Breath sounds normal. No  respiratory distress. She has no wheezes. She has no rales.  Abdominal: Soft. Bowel sounds are normal. There is no tenderness. There is no guarding.  Musculoskeletal: Normal range of motion.  Lymphadenopathy:       Head (right side): No submandibular adenopathy present.       Head (left side): No submandibular adenopathy present.    She has no cervical adenopathy.  Neurological: She is alert and oriented to person, place, and time. She has normal strength. No cranial nerve deficit or sensory deficit.  Skin: Skin is warm and dry.  There is a papular rash noted at various sites. There are some broken skin areas on the upper extremities from scratching.  Psychiatric: She has a normal mood and affect. Her speech is normal.  Nursing note and vitals reviewed.   ED Course  Procedures (including critical care time) Labs Review Labs Reviewed - No data to display  Imaging Review No results found.   EKG Interpretation None      MDM  Patient has a chronic pruritus. The problem seemed to be worse tonight than usual. The patient will be treated with Vistaril 12.5, or 25 mg every 6 hours if needed for itching. Patient is advised to see her primary physician for additional evaluation and management.    Final diagnoses:  None    *I have reviewed nursing notes, vital signs, and all appropriate lab and imaging results for this patient.[address], PA-C 09/16/14 9518  Devoria Albe, MD 09/16/14 [phone]

## 2014-09-16 NOTE — Discharge Instructions (Signed)
Pruritus  °Pruritus is an itch. There are many different problems that can cause an itch. Dry skin is one of the most common causes of itching. Most cases of itching do not require medical attention.  °HOME CARE INSTRUCTIONS  °Make sure your skin is moistened on a regular basis. A moisturizer that contains petroleum jelly is best for keeping moisture in your skin. If you develop a rash, you may try the following for relief:  °· Use corticosteroid cream. °· Apply cool compresses to the affected areas. °· Bathe with Epsom salts or baking soda in the bathwater. °· Soak in colloidal oatmeal baths. These are available at your pharmacy. °· Apply baking soda paste to the rash. Stir water into baking soda until it reaches a paste-like consistency. °· Use an anti-itch lotion. °· Take over-the-counter diphenhydramine medicine by mouth as the instructions direct. °· Avoid scratching. Scratching may cause the rash to become infected. If itching is very bad, your caregiver may suggest prescription lotions or creams to lessen your symptoms. °· Avoid hot showers, which can make itching worse. A cold shower may help with itching as long as you use a moisturizer after the shower. °SEEK MEDICAL CARE IF: °The itching does not go away after several days. °Document Released: 10/22/2010 Document Revised: 06/26/2013 Document Reviewed: 10/22/2010 °[employer]® Patient Information ©2015 [employer]. This information is not intended to replace advice given to you by your health care provider. Make sure you discuss any questions you have with your health care provider. ° °

## 2014-09-17 ENCOUNTER — Emergency Department (HOSPITAL_COMMUNITY): Payer: Medicare Other

## 2014-09-17 ENCOUNTER — Emergency Department (HOSPITAL_COMMUNITY)
Admission: EM | Admit: 2014-09-17 | Discharge: 2014-09-17 | Disposition: A | Discharge status: Home / Self Care | Payer: Medicare Other | Attending: Emergency Medicine | Admitting: Emergency Medicine

## 2014-09-17 ENCOUNTER — Encounter (HOSPITAL_COMMUNITY): Payer: Self-pay | Admitting: *Deleted

## 2014-09-17 DIAGNOSIS — Z872 Personal history of diseases of the skin and subcutaneous tissue: Secondary | ICD-10-CM | POA: Insufficient documentation

## 2014-09-17 DIAGNOSIS — Z8739 Personal history of other diseases of the musculoskeletal system and connective tissue: Secondary | ICD-10-CM | POA: Diagnosis not present

## 2014-09-17 DIAGNOSIS — Z87891 Personal history of nicotine dependence: Secondary | ICD-10-CM | POA: Insufficient documentation

## 2014-09-17 DIAGNOSIS — Y9389 Activity, other specified: Secondary | ICD-10-CM | POA: Insufficient documentation

## 2014-09-17 DIAGNOSIS — Z862 Personal history of diseases of the blood and blood-forming organs and certain disorders involving the immune mechanism: Secondary | ICD-10-CM | POA: Insufficient documentation

## 2014-09-17 DIAGNOSIS — Z79899 Other long term (current) drug therapy: Secondary | ICD-10-CM | POA: Diagnosis not present

## 2014-09-17 DIAGNOSIS — S99922A Unspecified injury of left foot, initial encounter: Secondary | ICD-10-CM | POA: Diagnosis present

## 2014-09-17 DIAGNOSIS — Y998 Other external cause status: Secondary | ICD-10-CM | POA: Diagnosis not present

## 2014-09-17 DIAGNOSIS — Z8619 Personal history of other infectious and parasitic diseases: Secondary | ICD-10-CM | POA: Insufficient documentation

## 2014-09-17 DIAGNOSIS — S92511A Displaced fracture of proximal phalanx of right lesser toe(s), initial encounter for closed fracture: Secondary | ICD-10-CM | POA: Diagnosis not present

## 2014-09-17 DIAGNOSIS — W010XXA Fall on same level from slipping, tripping and stumbling without subsequent striking against object, initial encounter: Secondary | ICD-10-CM | POA: Insufficient documentation

## 2014-09-17 DIAGNOSIS — Z8679 Personal history of other diseases of the circulatory system: Secondary | ICD-10-CM | POA: Insufficient documentation

## 2014-09-17 DIAGNOSIS — Z880 Allergy status to penicillin: Secondary | ICD-10-CM | POA: Insufficient documentation

## 2014-09-17 DIAGNOSIS — Z7951 Long term (current) use of inhaled steroids: Secondary | ICD-10-CM | POA: Insufficient documentation

## 2014-09-17 DIAGNOSIS — Y92002 Bathroom of unspecified non-institutional (private) residence single-family (private) house as the place of occurrence of the external cause: Secondary | ICD-10-CM | POA: Diagnosis not present

## 2014-09-17 DIAGNOSIS — S92912A Unspecified fracture of left toe(s), initial encounter for closed fracture: Secondary | ICD-10-CM

## 2014-09-17 MED ORDER — NAPROXEN 500 MG PO TABS: 500.0000 mg | ORAL_TABLET | Freq: Two times a day (BID) | ORAL | Status: DC

## 2014-09-17 MED ORDER — NAPROXEN 250 MG PO TABS
500.0000 mg | ORAL_TABLET | Freq: Once | ORAL | Status: AC
  Administered 2014-09-17: 500 mg via ORAL
  Filled 2014-09-17: qty 2

## 2014-09-17 NOTE — Discharge Instructions (Signed)

## 2014-09-17 NOTE — ED Notes (Addendum)
Larey Seat today after slipping, c/o right foot and left foot, not able to put all weight on right foot, denies hitting her head

## 2014-09-17 NOTE — ED Provider Notes (Signed)
CSN: [id number]     Arrival date & time 09/17/14  1637 History   First MD Initiated Contact with Patient 09/17/14 1907     Chief Complaint  Patient presents with  . Fall     (Consider location/radiation/quality/duration/timing/severity/associated sxs/prior Treatment) HPI Comments: The patient is a 80 year old female, she has a history of no significant medical problems. She presents to the hospital today after stating that she fell after slipping on her wet bathroom floor. She twisted her right foot when she slipped and felt pain in her toes especially the fourth and fifth toe. She did not hit her head, she did not injure her arms, she did not injure her ribs and has no shortness of breath or chest pain. She was able to ambulate and come to the hospital with her son who drove her here. She does walk with a stable gait but has some pain in her foot. She also endorses having swelling and bruising.  Patient is a 80 y.o. female presenting with fall. The history is provided by the patient and a relative.  Fall    Past Medical History  Diagnosis Date  . Takotsubo cardiomyopathy EF 60%- 12/2010    s/p cath July 2012. EF is 35 to 40%. 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 Hypothyroidism   . Anemia   . Costochondritis   . Arthritis   . Cellulitis   . Shingles    Past Surgical History  Procedure Laterality Date  . Inguinal hernia repair    . Cardiac catheterization  September 10, 2010    EF 35 to 40%. No significant CAD. ? takotsubo cardiomyopathy   Family History  Problem Relation Age of Onset  . Heart disease Father   . Heart disease Brother    History  Substance Use Topics  . Smoking status: Former Smoker -- 0.50 packs/day    Types: Cigarettes    Quit date: 09/07/2010  . Smokeless tobacco: Not on file  . Alcohol Use: No   OB History    No data available     Review of Systems  Gastrointestinal: Negative for nausea and vomiting.  Musculoskeletal: Positive for joint  swelling (Right fourth and fifth toes). Negative for back pain and neck pain.  Neurological: Negative for weakness and numbness.      Allergies  Aspirin and Penicillins  Home Medications   Prior to Admission medications   Medication Sig Start Date End Date Taking? Authorizing Provider  acetaminophen (TYLENOL) 500 MG tablet Take 500 mg by mouth every 6 (six) hours as needed for mild pain or moderate pain.    Historical Provider, MD  cetirizine (ZYRTEC) 10 MG tablet Take 10 mg by mouth every morning.     Historical Provider, MD  diazepam (VALIUM) 2 MG tablet Take 1 tablet (2 mg total) by mouth every 8 (eight) hours as needed for muscle spasms. 05/26/13   Lorre Nick, MD  fluticasone (FLONASE) 50 MCG/ACT nasal spray Place 1 spray into the nose 2 (two) times daily.  09/03/10   Historical Provider, MD  HYDROcodone-acetaminophen (NORCO/VICODIN) 5-325 MG per tablet 1 po hs prn pain 06/13/14   Ivery Quale, PA-C  hydrOXYzine (ATARAX/VISTARIL) 10 MG tablet 1 or 2 po at hs, or  q6h prn itching 09/16/14   Ivery Quale, PA-C  levothyroxine (SYNTHROID, LEVOTHROID) 50 MCG tablet Take 50 mcg by mouth every morning.     Historical Provider, MD  naproxen (NAPROSYN) 500 MG tablet Take 1 tablet (500 mg  total) by mouth 2 (two) times daily with a meal. 09/17/14   Eber Hong, MD  rosuvastatin (CRESTOR) 10 MG tablet Take 1 tablet (10 mg total) by mouth at bedtime. 03/06/13 03/06/14  Peter M [place], MD   BP 152/74 mmHg  Pulse 68  Temp(Src) 98.2 F (36.8 C) (Oral)  Resp 17  Ht  (1.702 m)  Wt 134 lb (60.782 kg)  BMI 20.98 kg/m2  SpO2 94% Physical Exam  Constitutional: She appears well-developed and well-nourished. No distress.  HENT:  Head: Normocephalic and atraumatic.  Eyes: Conjunctivae are normal. No scleral icterus.  Cardiovascular: Normal rate, regular rhythm and intact distal pulses.   Pulmonary/Chest: Effort normal and breath sounds normal.  Musculoskeletal: She exhibits tenderness (  Tenderness and bruising over the right fourth and fifth toes, bruising also extends onto the dorsum of the foot more medially). She exhibits no edema.  Neurological: She is alert.  Skin: Skin is warm and dry. No rash noted. She is not diaphoretic.  Nursing note and vitals reviewed.   ED Course  Procedures (including critical care time) Labs Review Labs Reviewed - No data to display  Imaging Review Dg Foot Complete Right  09/17/2014   CLINICAL DATA:  Larey Seat getting out of the shower. Pain throughout the right foot. Bruising.  EXAM: RIGHT FOOT COMPLETE - 3+ VIEW  COMPARISON:  None.  FINDINGS: There is an acute fracture involving the base of the fifth proximal phalanx. Fracture is comminuted and probably involves the articular surface. There is an acute fracture involving the base of the fourth proximal phalanx. Suspect old trauma of the third proximal interphalangeal joint. Hallux valgus deformity.  IMPRESSION: Fractures of the fourth and fifth proximal phalanges.   Electronically Signed   By: Norva Pavlov M.D.   On: 09/17/2014 18:51      MDM   Final diagnoses:  Toe fracture, left, closed, initial encounter    The ankle is nontender, the other joints are very supple, she has soft compartments, tenderness with range of motion of her fourth and fifth toes on the right foot. I have personally viewed the x-rays and I agree with the radiologist interpretation that there are fractures of the fourth and fifth proximal phalanx, these are nondisplaced, we'll place in a postop shoe, have follow-up with orthopedics, [facility] therapy, patient in agreement.  I have personally viewed and interpreted the imaging and agree with radiologist interpretation.  Meds given in ED:  Medications  naproxen (NAPROSYN) tablet 500 mg (not administered)    New Prescriptions   NAPROXEN (NAPROSYN) 500 MG TABLET    Take 1 tablet (500 mg total) by mouth 2 (two) times daily with a meal.       Eber Hong,  MD 09/17/14 1946

## 2014-10-02 ENCOUNTER — Emergency Department (HOSPITAL_COMMUNITY): Payer: Medicare Other

## 2014-10-02 ENCOUNTER — Inpatient Hospital Stay (HOSPITAL_COMMUNITY)
Admission: EM | Admit: 2014-10-02 | Discharge: 2014-10-13 | DRG: 576 | Disposition: A | Discharge status: Skilled Nursing Facility | Payer: Medicare Other | Attending: Internal Medicine | Admitting: Internal Medicine

## 2014-10-02 ENCOUNTER — Encounter (HOSPITAL_COMMUNITY)
Admission: EM | Disposition: A | Discharge status: Skilled Nursing Facility | Payer: Self-pay | Source: Home / Self Care | Attending: Cardiology

## 2014-10-02 ENCOUNTER — Encounter (HOSPITAL_COMMUNITY): Payer: Self-pay | Admitting: *Deleted

## 2014-10-02 DIAGNOSIS — E039 Hypothyroidism, unspecified: Secondary | ICD-10-CM | POA: Diagnosis present

## 2014-10-02 DIAGNOSIS — I219 Acute myocardial infarction, unspecified: Secondary | ICD-10-CM | POA: Insufficient documentation

## 2014-10-02 DIAGNOSIS — I2119 ST elevation (STEMI) myocardial infarction involving other coronary artery of inferior wall: Secondary | ICD-10-CM

## 2014-10-02 DIAGNOSIS — S91312A Laceration without foreign body, left foot, initial encounter: Secondary | ICD-10-CM

## 2014-10-02 DIAGNOSIS — I2109 ST elevation (STEMI) myocardial infarction involving other coronary artery of anterior wall: Secondary | ICD-10-CM | POA: Diagnosis present

## 2014-10-02 DIAGNOSIS — E038 Other specified hypothyroidism: Secondary | ICD-10-CM | POA: Diagnosis not present

## 2014-10-02 DIAGNOSIS — I10 Essential (primary) hypertension: Secondary | ICD-10-CM | POA: Diagnosis present

## 2014-10-02 DIAGNOSIS — I213 ST elevation (STEMI) myocardial infarction of unspecified site: Secondary | ICD-10-CM | POA: Diagnosis present

## 2014-10-02 DIAGNOSIS — I959 Hypotension, unspecified: Secondary | ICD-10-CM | POA: Diagnosis not present

## 2014-10-02 DIAGNOSIS — R52 Pain, unspecified: Secondary | ICD-10-CM | POA: Diagnosis present

## 2014-10-02 DIAGNOSIS — E871 Hypo-osmolality and hyponatremia: Secondary | ICD-10-CM | POA: Diagnosis not present

## 2014-10-02 DIAGNOSIS — M25559 Pain in unspecified hip: Secondary | ICD-10-CM

## 2014-10-02 DIAGNOSIS — S91319A Laceration without foreign body, unspecified foot, initial encounter: Secondary | ICD-10-CM | POA: Insufficient documentation

## 2014-10-02 DIAGNOSIS — S8265XA Nondisplaced fracture of lateral malleolus of left fibula, initial encounter for closed fracture: Secondary | ICD-10-CM | POA: Diagnosis present

## 2014-10-02 DIAGNOSIS — R509 Fever, unspecified: Secondary | ICD-10-CM | POA: Diagnosis not present

## 2014-10-02 DIAGNOSIS — N390 Urinary tract infection, site not specified: Secondary | ICD-10-CM | POA: Diagnosis not present

## 2014-10-02 DIAGNOSIS — E222 Syndrome of inappropriate secretion of antidiuretic hormone: Secondary | ICD-10-CM | POA: Diagnosis present

## 2014-10-02 DIAGNOSIS — L03116 Cellulitis of left lower limb: Secondary | ICD-10-CM | POA: Diagnosis not present

## 2014-10-02 DIAGNOSIS — D649 Anemia, unspecified: Secondary | ICD-10-CM | POA: Diagnosis present

## 2014-10-02 DIAGNOSIS — W101XXA Fall (on)(from) sidewalk curb, initial encounter: Secondary | ICD-10-CM | POA: Diagnosis present

## 2014-10-02 DIAGNOSIS — I248 Other forms of acute ischemic heart disease: Secondary | ICD-10-CM | POA: Insufficient documentation

## 2014-10-02 DIAGNOSIS — Z87891 Personal history of nicotine dependence: Secondary | ICD-10-CM

## 2014-10-02 DIAGNOSIS — S92909A Unspecified fracture of unspecified foot, initial encounter for closed fracture: Secondary | ICD-10-CM | POA: Diagnosis present

## 2014-10-02 DIAGNOSIS — R5081 Fever presenting with conditions classified elsewhere: Secondary | ICD-10-CM | POA: Diagnosis not present

## 2014-10-02 DIAGNOSIS — E785 Hyperlipidemia, unspecified: Secondary | ICD-10-CM | POA: Diagnosis present

## 2014-10-02 DIAGNOSIS — I5181 Takotsubo syndrome: Secondary | ICD-10-CM | POA: Diagnosis not present

## 2014-10-02 DIAGNOSIS — I2489 Other forms of acute ischemic heart disease: Secondary | ICD-10-CM | POA: Insufficient documentation

## 2014-10-02 DIAGNOSIS — I429 Cardiomyopathy, unspecified: Secondary | ICD-10-CM | POA: Diagnosis not present

## 2014-10-02 HISTORY — PX: CARDIAC CATHETERIZATION: SHX172

## 2014-10-02 LAB — BASIC METABOLIC PANEL
Anion gap: 9 (ref 5–15)
BUN: 5 mg/dL — ABNORMAL LOW (ref 6–20)
CALCIUM: 8.8 mg/dL — AB (ref 8.9–10.3)
CO2: 25 mmol/L (ref 22–32)
Chloride: 94 mmol/L — ABNORMAL LOW (ref 101–111)
Creatinine, Ser: 0.65 mg/dL (ref 0.44–1.00)
GFR calc Af Amer: >60 mL/min (ref >60)
GLUCOSE: 101 mg/dL — AB (ref 65–99)
Potassium: 3.8 mmol/L (ref 3.5–5.1)
Sodium: 128 mmol/L — ABNORMAL LOW (ref 135–145)

## 2014-10-02 LAB — URINE MICROSCOPIC-ADD ON

## 2014-10-02 LAB — URINALYSIS, ROUTINE W REFLEX MICROSCOPIC
Bilirubin Urine: NEGATIVE
Glucose, UA: NEGATIVE mg/dL
HGB URINE DIPSTICK: NEGATIVE
KETONES UR: NEGATIVE mg/dL
Nitrite: POSITIVE — AB
PH: 7 (ref 5.0–8.0)
PROTEIN: NEGATIVE mg/dL
Urobilinogen, UA: 0.2 mg/dL (ref 0.0–1.0)

## 2014-10-02 LAB — CBC WITH DIFFERENTIAL/PLATELET
Basophils Absolute: 0 10*3/uL (ref 0.0–0.1)
Basophils Relative: 0 % (ref 0–1)
EOS PCT: 4 % (ref 0–5)
Eosinophils Absolute: 0.3 10*3/uL (ref 0.0–0.7)
HEMATOCRIT: 37.8 % (ref 36.0–46.0)
Hemoglobin: 12.8 g/dL (ref 12.0–15.0)
LYMPHS PCT: 19 % (ref 12–46)
Lymphs Abs: 1.4 10*3/uL (ref 0.7–4.0)
MCH: 30.5 pg (ref 26.0–34.0)
MCHC: 33.9 g/dL (ref 30.0–36.0)
MCV: 90.2 fL (ref 78.0–100.0)
MONOS PCT: 11 % (ref 3–12)
Monocytes Absolute: 0.8 10*3/uL (ref 0.1–1.0)
NEUTROS ABS: 4.8 10*3/uL (ref 1.7–7.7)
Neutrophils Relative %: 66 % (ref 43–77)
PLATELETS: 276 10*3/uL (ref 150–400)
RBC: 4.19 MIL/uL (ref 3.87–5.11)
RDW: 13.8 % (ref 11.5–15.5)
WBC: 7.3 10*3/uL (ref 4.0–10.5)

## 2014-10-02 LAB — I-STAT TROPONIN, ED: TROPONIN I, POC: 0.27 ng/mL — AB (ref 0.00–0.08)

## 2014-10-02 LAB — TSH: TSH: 1.246 u[IU]/mL (ref 0.350–4.500)

## 2014-10-02 LAB — TROPONIN I: Troponin I: 10.26 ng/mL (ref <0.031)

## 2014-10-02 LAB — MRSA PCR SCREENING: MRSA by PCR: POSITIVE — AB

## 2014-10-02 SURGERY — LEFT HEART CATH AND CORONARY ANGIOGRAPHY
Anesthesia: LOCAL

## 2014-10-02 MED ORDER — LISINOPRIL 2.5 MG PO TABS
2.5000 mg | ORAL_TABLET | Freq: Every day | ORAL | Status: DC
  Administered 2014-10-02 – 2014-10-13 (×9): 2.5 mg via ORAL
  Filled 2014-10-02 (×14): qty 1

## 2014-10-02 MED ORDER — ROSUVASTATIN CALCIUM 10 MG PO TABS
10.0000 mg | ORAL_TABLET | Freq: Every day | ORAL | Status: DC
  Administered 2014-10-02: 10 mg via ORAL
  Filled 2014-10-02 (×2): qty 1

## 2014-10-02 MED ORDER — ONDANSETRON HCL 4 MG/2ML IJ SOLN
4.0000 mg | Freq: Once | INTRAMUSCULAR | Status: AC
  Administered 2014-10-02: 4 mg via INTRAVENOUS
  Filled 2014-10-02: qty 2

## 2014-10-02 MED ORDER — SODIUM CHLORIDE 0.9 % IJ SOLN: 3.0000 mL | INTRAMUSCULAR | Status: DC | PRN

## 2014-10-02 MED ORDER — FENTANYL CITRATE (PF) 100 MCG/2ML IJ SOLN
INTRAMUSCULAR | Status: AC
Start: 2014-10-02 — End: 2014-10-02
  Filled 2014-10-02: qty 4

## 2014-10-02 MED ORDER — IOHEXOL 350 MG/ML SOLN
INTRAVENOUS | Status: DC | PRN
  Administered 2014-10-02: 50 mL via INTRACARDIAC

## 2014-10-02 MED ORDER — LIDOCAINE HCL (PF) 1 % IJ SOLN
INTRAMUSCULAR | Status: AC
  Filled 2014-10-02: qty 30

## 2014-10-02 MED ORDER — SODIUM CHLORIDE 0.9 % IJ SOLN
3.0000 mL | Freq: Two times a day (BID) | INTRAMUSCULAR | Status: DC
  Administered 2014-10-03 – 2014-10-13 (×19): 3 mL via INTRAVENOUS

## 2014-10-02 MED ORDER — HEPARIN (PORCINE) IN NACL 2-0.9 UNIT/ML-% IJ SOLN
INTRAMUSCULAR | Status: AC
  Filled 2014-10-02: qty 1000

## 2014-10-02 MED ORDER — NITROGLYCERIN 1 MG/10 ML FOR IR/CATH LAB
INTRA_ARTERIAL | Status: AC
  Filled 2014-10-02: qty 10

## 2014-10-02 MED ORDER — ACETAMINOPHEN 325 MG PO TABS: 650.0000 mg | ORAL_TABLET | ORAL | Status: DC | PRN

## 2014-10-02 MED ORDER — LIDOCAINE HCL (PF) 1 % IJ SOLN
INTRAMUSCULAR | Status: DC | PRN
  Administered 2014-10-02: 5 mL via SUBCUTANEOUS

## 2014-10-02 MED ORDER — HYDROCODONE-ACETAMINOPHEN 5-325 MG PO TABS
1.0000 | ORAL_TABLET | Freq: Four times a day (QID) | ORAL | Status: DC | PRN
  Administered 2014-10-02 – 2014-10-07 (×10): 1 via ORAL
  Filled 2014-10-02 (×9): qty 1

## 2014-10-02 MED ORDER — MORPHINE SULFATE 4 MG/ML IJ SOLN
4.0000 mg | Freq: Once | INTRAMUSCULAR | Status: AC
  Administered 2014-10-02: 4 mg via INTRAVENOUS
  Filled 2014-10-02: qty 1

## 2014-10-02 MED ORDER — TETANUS-DIPHTH-ACELL PERTUSSIS 5-2.5-18.5 LF-MCG/0.5 IM SUSP
0.5000 mL | Freq: Once | INTRAMUSCULAR | Status: AC
  Administered 2014-10-02: 0.5 mL via INTRAMUSCULAR
  Filled 2014-10-02: qty 0.5

## 2014-10-02 MED ORDER — SODIUM CHLORIDE 0.9 % IV BOLUS (SEPSIS)
1000.0000 mL | Freq: Once | INTRAVENOUS | Status: AC
  Administered 2014-10-02: 1000 mL via INTRAVENOUS

## 2014-10-02 MED ORDER — HYDROCODONE-ACETAMINOPHEN 5-325 MG PO TABS
ORAL_TABLET | ORAL | Status: AC
  Filled 2014-10-02: qty 1

## 2014-10-02 MED ORDER — SODIUM CHLORIDE 0.9 % IV SOLN: 250.0000 mL | INTRAVENOUS | Status: DC | PRN

## 2014-10-02 MED ORDER — HEPARIN SODIUM (PORCINE) 1000 UNIT/ML IJ SOLN
INTRAMUSCULAR | Status: AC
  Filled 2014-10-02: qty 1

## 2014-10-02 MED ORDER — ACETAMINOPHEN 325 MG PO TABS
650.0000 mg | ORAL_TABLET | ORAL | Status: DC | PRN
  Administered 2014-10-03 – 2014-10-07 (×9): 650 mg via ORAL
  Filled 2014-10-02 (×9): qty 2

## 2014-10-02 MED ORDER — CARVEDILOL 3.125 MG PO TABS
3.1250 mg | ORAL_TABLET | Freq: Two times a day (BID) | ORAL | Status: DC
  Administered 2014-10-02: 3.125 mg via ORAL
  Filled 2014-10-02 (×5): qty 1

## 2014-10-02 MED ORDER — HYDROXYZINE HCL 10 MG PO TABS
10.0000 mg | ORAL_TABLET | Freq: Every evening | ORAL | Status: DC | PRN
  Administered 2014-10-02: 10 mg via ORAL
  Administered 2014-10-03 – 2014-10-04 (×2): 20 mg via ORAL
  Administered 2014-10-11: 10 mg via ORAL
  Filled 2014-10-02 (×3): qty 2
  Filled 2014-10-02: qty 1

## 2014-10-02 MED ORDER — CHLORHEXIDINE GLUCONATE CLOTH 2 % EX PADS
6.0000 | MEDICATED_PAD | Freq: Every day | CUTANEOUS | Status: AC
  Administered 2014-10-03 – 2014-10-07 (×5): 6 via TOPICAL

## 2014-10-02 MED ORDER — SODIUM CHLORIDE 0.9 % IJ SOLN
3.0000 mL | Freq: Two times a day (BID) | INTRAMUSCULAR | Status: DC
  Administered 2014-10-02 – 2014-10-03 (×3): 3 mL via INTRAVENOUS

## 2014-10-02 MED ORDER — VERAPAMIL HCL 2.5 MG/ML IV SOLN
INTRAVENOUS | Status: AC
  Filled 2014-10-02: qty 2

## 2014-10-02 MED ORDER — CIPROFLOXACIN HCL 250 MG PO TABS
250.0000 mg | ORAL_TABLET | Freq: Two times a day (BID) | ORAL | Status: DC
  Administered 2014-10-02 – 2014-10-06 (×8): 250 mg via ORAL
  Filled 2014-10-02 (×10): qty 1

## 2014-10-02 MED ORDER — SODIUM CHLORIDE 0.9 % WEIGHT BASED INFUSION
1.0000 mL/kg/h | INTRAVENOUS | Status: AC
  Administered 2014-10-02: 1 mL/kg/h via INTRAVENOUS

## 2014-10-02 MED ORDER — VERAPAMIL HCL 2.5 MG/ML IV SOLN
INTRAVENOUS | Status: DC | PRN
  Administered 2014-10-02: 14:00:00 via INTRA_ARTERIAL

## 2014-10-02 MED ORDER — NITROGLYCERIN 0.4 MG SL SUBL: 0.4000 mg | SUBLINGUAL_TABLET | SUBLINGUAL | Status: DC | PRN

## 2014-10-02 MED ORDER — FENTANYL CITRATE (PF) 100 MCG/2ML IJ SOLN
INTRAMUSCULAR | Status: DC | PRN
  Administered 2014-10-02: 25 ug via INTRAVENOUS

## 2014-10-02 MED ORDER — LIDOCAINE HCL (PF) 1 % IJ SOLN
INTRAMUSCULAR | Status: DC | PRN
  Administered 2014-10-02: 15:00:00

## 2014-10-02 MED ORDER — ZOLPIDEM TARTRATE 5 MG PO TABS
5.0000 mg | ORAL_TABLET | Freq: Every evening | ORAL | Status: DC | PRN
  Filled 2014-10-02: qty 1

## 2014-10-02 MED ORDER — CEFAZOLIN SODIUM 1-5 GM-% IV SOLN
1.0000 g | Freq: Once | INTRAVENOUS | Status: AC
  Administered 2014-10-02: 1 g via INTRAVENOUS
  Filled 2014-10-02: qty 50

## 2014-10-02 MED ORDER — ONDANSETRON HCL 4 MG/2ML IJ SOLN: 4.0000 mg | Freq: Four times a day (QID) | INTRAMUSCULAR | Status: DC | PRN

## 2014-10-02 MED ORDER — MUPIROCIN 2 % EX OINT
1.0000 "application " | TOPICAL_OINTMENT | Freq: Two times a day (BID) | CUTANEOUS | Status: AC
  Administered 2014-10-02 – 2014-10-07 (×10): 1 via NASAL
  Filled 2014-10-02 (×2): qty 22

## 2014-10-02 MED ORDER — ALPRAZOLAM 0.25 MG PO TABS
0.2500 mg | ORAL_TABLET | Freq: Two times a day (BID) | ORAL | Status: DC | PRN
  Administered 2014-10-06 – 2014-10-07 (×3): 0.25 mg via ORAL
  Filled 2014-10-02 (×4): qty 1

## 2014-10-02 SURGICAL SUPPLY — 12 items

## 2014-10-02 NOTE — ED Notes (Signed)
Bleeding is now controlled. Patient's foot in wrapped with bandage by Adriana Simas, MD.

## 2014-10-02 NOTE — Progress Notes (Signed)
CRITICAL VALUE ALERT  Critical value received:  Troponin 10.26  Date of notification:  10/02/14  Time of notification:  2025  Critical value read back:Yes.    Nurse who received alert:  Alycia Rossetti   MD notified (1st page):  Cards Fellow  Time of first page: 2025  Expected lab value post cath.

## 2014-10-02 NOTE — ED Notes (Signed)
All anticoagulants are held per MD request due to patients foot laceration.
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
## 2014-10-02 NOTE — ED Notes (Signed)
EKG taken because of new wave form. MD made aware and states we are going to monitor patient at this time. Pt is alert and oriented and denies any chest pain or SOB.    Pt states she is only having pain in her vagina. New onset. MD made aware.

## 2014-10-02 NOTE — ED Notes (Signed)
[facility] called for Code Stemi.  Also called [facility]  To Transport to [facility].

## 2014-10-02 NOTE — Progress Notes (Addendum)
Urinalysis    Component Value Date/Time   COLORURINE YELLOW 10/02/2014 1230   APPEARANCEUR CLEAR 10/02/2014 1230   LABSPEC <1.005* 10/02/2014 1230   PHURINE 7.0 10/02/2014 1230   GLUCOSEU NEGATIVE 10/02/2014 1230   HGBUR NEGATIVE 10/02/2014 1230   BILIRUBINUR NEGATIVE 10/02/2014 1230   KETONESUR NEGATIVE 10/02/2014 1230   PROTEINUR NEGATIVE 10/02/2014 1230   UROBILINOGEN 0.2 10/02/2014 1230   NITRITE POSITIVE* 10/02/2014 1230   LEUKOCYTESUR SMALL* 10/02/2014 1230        BACTERIA MANYI    Pt having problems with incontinence and UA is abnormal. Will add Cipro at 250 mg bid x 5 days.  Theodore Demark, Cordelia Poche 10/02/2014 4:29 PM Beeper [phone]

## 2014-10-02 NOTE — Progress Notes (Signed)
Lt foot, ankle and toes extremely swollen, 2nd-3rd toes bruised. Elevated on pillow.

## 2014-10-02 NOTE — ED Notes (Signed)
Pt has foot elevated and with tight dressing applied. NAD noted.   Cap refill <3 seconds in toes distal to skin tear

## 2014-10-03 ENCOUNTER — Inpatient Hospital Stay (HOSPITAL_COMMUNITY): Payer: Medicare Other

## 2014-10-03 DIAGNOSIS — I10 Essential (primary) hypertension: Secondary | ICD-10-CM

## 2014-10-03 DIAGNOSIS — I429 Cardiomyopathy, unspecified: Secondary | ICD-10-CM

## 2014-10-03 LAB — CBC
HCT: 33 % — ABNORMAL LOW (ref 36.0–46.0)
HEMOGLOBIN: 11.1 g/dL — AB (ref 12.0–15.0)
MCH: 30.1 pg (ref 26.0–34.0)
MCHC: 33.6 g/dL (ref 30.0–36.0)
MCV: 89.4 fL (ref 78.0–100.0)
Platelets: 254 10*3/uL (ref 150–400)
RBC: 3.69 MIL/uL — ABNORMAL LOW (ref 3.87–5.11)
RDW: 14 % (ref 11.5–15.5)
WBC: 11.7 10*3/uL — ABNORMAL HIGH (ref 4.0–10.5)

## 2014-10-03 LAB — BASIC METABOLIC PANEL
ANION GAP: 9 (ref 5–15)
BUN: <5 mg/dL — ABNORMAL LOW (ref 6–20)
CO2: 23 mmol/L (ref 22–32)
Calcium: 8.4 mg/dL — ABNORMAL LOW (ref 8.9–10.3)
Chloride: 94 mmol/L — ABNORMAL LOW (ref 101–111)
Creatinine, Ser: 0.57 mg/dL (ref 0.44–1.00)
GLUCOSE: 121 mg/dL — AB (ref 65–99)
Potassium: 4.5 mmol/L (ref 3.5–5.1)
SODIUM: 126 mmol/L — AB (ref 135–145)

## 2014-10-03 LAB — TROPONIN I
TROPONIN I: 4.78 ng/mL — AB (ref <0.031)
Troponin I: 9.94 ng/mL (ref <0.031)

## 2014-10-03 MED ORDER — NOREPINEPHRINE BITARTRATE 1 MG/ML IV SOLN
0.0000 ug/min | INTRAVENOUS | Status: DC
  Administered 2014-10-03: 2 ug/min via INTRAVENOUS
  Filled 2014-10-03: qty 4

## 2014-10-03 MED ORDER — SODIUM CHLORIDE 0.9 % IV BOLUS (SEPSIS)
250.0000 mL | Freq: Once | INTRAVENOUS | Status: AC
  Administered 2014-10-03: 250 mL via INTRAVENOUS

## 2014-10-03 MED ORDER — LIDOCAINE HCL (PF) 1 % IJ SOLN
INTRAMUSCULAR | Status: AC
  Filled 2014-10-03: qty 5

## 2014-10-03 MED ORDER — SODIUM CHLORIDE 0.9 % IV BOLUS (SEPSIS)
500.0000 mL | Freq: Once | INTRAVENOUS | Status: AC
  Administered 2014-10-03: 500 mL via INTRAVENOUS

## 2014-10-03 MED ORDER — ROSUVASTATIN CALCIUM 40 MG PO TABS
40.0000 mg | ORAL_TABLET | Freq: Every day | ORAL | Status: DC
  Administered 2014-10-03 – 2014-10-12 (×10): 40 mg via ORAL
  Filled 2014-10-03 (×13): qty 1

## 2014-10-03 MED ORDER — NOREPINEPHRINE BITARTRATE 1 MG/ML IV SOLN
0.0000 ug/min | INTRAVENOUS | Status: DC
  Administered 2014-10-04 – 2014-10-05 (×2): 6 ug/min via INTRAVENOUS
  Filled 2014-10-03 (×2): qty 16

## 2014-10-03 MED ORDER — SODIUM CHLORIDE 0.9 % IV SOLN
INTRAVENOUS | Status: DC
  Administered 2014-10-05 (×2): via INTRAVENOUS

## 2014-10-03 MED ORDER — ASPIRIN 81 MG PO CHEW
81.0000 mg | CHEWABLE_TABLET | Freq: Every day | ORAL | Status: DC
  Administered 2014-10-03 – 2014-10-11 (×8): 81 mg via ORAL
  Filled 2014-10-03 (×8): qty 1

## 2014-10-03 MED ORDER — HYDROCORTISONE 1 % EX CREA
TOPICAL_CREAM | CUTANEOUS | Status: DC | PRN
  Administered 2014-10-03 (×2): 1 via TOPICAL
  Filled 2014-10-03: qty 28

## 2014-10-03 MED ORDER — LIDOCAINE HCL (PF) 1 % IJ SOLN
10.0000 mL | Freq: Once | INTRAMUSCULAR | Status: AC
  Administered 2014-10-03: 10 mL via INTRADERMAL

## 2014-10-03 MED ORDER — SODIUM CHLORIDE 0.9 % IV SOLN
INTRAVENOUS | Status: AC
  Administered 2014-10-03: 09:00:00 via INTRAVENOUS
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
## 2014-10-03 NOTE — Progress Notes (Addendum)
Paged PA about BP 60/40s. Awaiting response. Pt is asymptomatic  PA wants to inc fluids to 100cc/hr and obtain orthostatic Bps

## 2014-10-03 NOTE — Progress Notes (Signed)
Paged night time Cards Fellow about ongoing hypotension after 2nd bolus. No response from MD. Pt. Remains asymptomatic, will continue to monitor.

## 2014-10-03 NOTE — Progress Notes (Signed)
Orthopedic Tech Progress Note Patient Details:  Charlene Vincent 1934-07-10 [id number]  Ortho Devices Type of Ortho Device: CAM walker Ortho Device/Splint Location: LLE Ortho Device/Splint Interventions: Ordered, Application   Jennye Moccasin 10/03/2014, 6:52 PM
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
## 2014-10-03 NOTE — Progress Notes (Signed)
  Echocardiogram 2D Echocardiogram has been performed.  Janalyn Harder 10/03/2014, 9:40 AM
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
## 2014-10-03 NOTE — Progress Notes (Addendum)
Called re: hypotension  Pt asymptomatic, got Lisinopril 2.5 mg earlier as SBP > 90.  Currently on IVF at 75 cc/hr.  Plan: will ck orthostatic VS and increase IVF if positive As long as asymptomatic and BP improves w/ supine position, continue IVF but avoid boluses as EF is low.  If orthostatics negative, may need pressors.   Discuss whether to d/c BB/ACE or increase parameters.  Continue to follow closely  Leanna Battles 10/03/2014 10:47 AM Beeper [phone]  Addendum:  Pt has had some IVF and SBP improved to 87. Still asymptomatic.  Continue to monitor.  Will change parameters on lisinopril and Coreg to SBP < 100. Hopefully will tolerate a low dose of one or both.  Since BP has not come up as much as hoped, will start Levophed and continue to monitor. Hopefully will improve overnight.  Leanna Battles 10/03/2014 3:17 PM Beeper [phone]

## 2014-10-03 NOTE — Progress Notes (Signed)
Orthostatic vitals taken. Pt is not able to stand at this time due to her foot being nonweight bearing.  Lying- 62/27  Sitting 71/44  Pt asymptomatic

## 2014-10-03 NOTE — Discharge Instructions (Signed)
Elevate left foot Nonweightbearing left leg in fracture boot Keep incision dry

## 2014-10-03 NOTE — Progress Notes (Addendum)
Patient doing well this morning. After timeout is called the left foot is prepped with saline and cleaned with a liter of pouring saline the large flap avulsion is reapproximated over 20 cm distance beginning proximal to the ankle joint and extending distally to the first MTP joint in a hyperbolic course 3 -0 simple nylon sutures utilized with approximately centimeter to a centimeter and a  half of space in between to allow for reflux of fluid. It is possible that this avulsed flap of skin may not be viable. Foot is dressed and we will place the patient in a fracture boot. She'll need to be nonweightbearing for a period of 3 weeks. Follow-up with me in 10 days for reevaluation possible suture removal

## 2014-10-04 LAB — LIPID PANEL
CHOL/HDL RATIO: 2.6 ratio
CHOLESTEROL: 82 mg/dL (ref 0–200)
HDL: 32 mg/dL — AB (ref >40)
LDL Cholesterol: 37 mg/dL (ref 0–99)
TRIGLYCERIDES: 67 mg/dL (ref <150)
VLDL: 13 mg/dL (ref 0–40)

## 2014-10-04 NOTE — Evaluation (Signed)
Physical Therapy Evaluation Patient Details Name: Charlene Vincent MRN: [id number] DOB: 09-06-1934 Today's Date: 10/04/2014   History of Present Illness  80 y.o. female with a history of Takotsubo CM. She went to [facility] ER with a foot injury from a mechanical fall. Her foot was treated and a laceration was repaired. She has a large pedal hematoma as well as foot/ankle fractures. Pt was on the monitor and there was concern for arrhythmia. An ECG was performed and was significantly abnormal. The ECG met STEMI criteria and she was transferred emergently to [facility] and taken directly to the cath lab.  Clinical Impression  Patient agreeable to participate in PT session today. She was able to participate in bed mobility and transfers as described below. At this time, patient is unable to comply with NWB precautions to participate in transfers due to generalized weakness and potential lack of understanding. This, in addition to her inaccessible home environment, necessitate a SNF recommendation. Patient will benefit from PT if [place] status is updated at any point to address transfer independence.    Follow Up Recommendations SNF;Supervision/Assistance - 24 hour;Supervision for mobility/OOB    Equipment Recommendations  Wheelchair (measurements PT) (If NWBing precautions are to be maintained.)    Recommendations for Other Services       Precautions / Restrictions Precautions Precautions: Fall Required Braces or Orthoses: Other Brace/Splint Other Brace/Splint: CAM Boot L foot Restrictions Weight Bearing Restrictions: Yes LLE Weight Bearing: Non weight bearing      Mobility  Bed Mobility Overal bed mobility: Needs Assistance Bed Mobility: Rolling;Supine to Sit Rolling: Min assist   Supine to sit: Min assist;HOB elevated (For L LE movement.)     General bed mobility comments: Able to perform bed mobility quite well, needed assistance for L LE movement as boot was heavy to her. Episode of  urinary incontinence while attempting to transfer to [facility].  Transfers Overall transfer level: Needs assistance Equipment used: Rolling walker (2 wheeled) Transfers: Sit to/from [facility] Sit to Stand: Mod assist;+2 physical assistance Stand pivot transfers: Mod assist;+2 safety/equipment       General transfer comment: Initial sit to stand required Mod A x2, patient able to tolerate Mod A x1 for 2 subsequent sit to stands. However, patient is unable to fully follow NWB precautions to perform any transfers. Required mod VC's to push up from the bed and to bear weight through arms and RW instead of L LE.  Ambulation/Gait                Stairs            Wheelchair Mobility    Modified Rankin (Stroke Patients Only)       Balance                                             Pertinent Vitals/Pain Pain Assessment: No/denies pain    Home Living Family/patient expects to be discharged to:: Private residence Living Arrangements: Spouse/significant other;Children Available Help at Discharge: Available 24 hours/day;Family Type of Home: Mobile home Home Access: Stairs to enter Entrance Stairs-Rails: Can reach both Entrance Stairs-Number of Steps: 4 Home Layout: One level Home Equipment: None Additional Comments: Patient reports husband can "just get her up the stairs."    Prior Function Level of Independence: Independent  Hand Dominance        Extremity/Trunk Assessment               Lower Extremity Assessment: Overall WFL for tasks assessed         Communication   Communication: Receptive difficulties (Slightly hard of hearing.)  Cognition Arousal/Alertness: Awake/alert Behavior During Therapy: Restless Overall Cognitive Status: Within Functional Limits for tasks assessed       Memory: Decreased recall of precautions              General Comments      Exercises General Exercises -  Lower Extremity Long Arc Quad: AROM;Left;10 reps;Seated (instructed to perform after lunch and dinner as well.) Hip Flexion/Marching: AROM;Left;20 reps;Seated (instructed to perform after lunch and dinner as well.)      Assessment/Plan    PT Assessment Patient needs continued PT services  PT Diagnosis Difficulty walking;Abnormality of gait;Generalized weakness;Acute pain   PT Problem List Decreased strength;Decreased range of motion;Decreased activity tolerance;Decreased balance;Decreased mobility;Decreased safety awareness;Decreased knowledge of precautions;Decreased knowledge of use of DME;Cardiopulmonary status limiting activity  PT Treatment Interventions DME instruction;Gait training;Stair training;Functional mobility training;Therapeutic activities;Therapeutic exercise;Balance training;Neuromuscular re-education;Patient/family education;Wheelchair mobility training   PT Goals (Current goals can be found in the Care Plan section) Acute Rehab PT Goals Patient Stated Goal: Go home and return to independent lifestyle. PT Goal Formulation: With patient Time For Goal Achievement: 10/18/14 Potential to Achieve Goals: Fair (Based on compliance with NWB precautions)    Frequency Min 3X/week   Barriers to discharge Inaccessible home environment Patient unsafe to return home at this time due to non-compliance with NWB precautions. Claims she can get around "just fine" if her husband can get her up the stairs and if she had a wheelchair to move around in.    Co-evaluation               End of Session Equipment Utilized During Treatment: Gait belt Activity Tolerance: Patient limited by fatigue;Treatment limited secondary to medical complications (Comment) Patient left: in chair;with call bell/phone within reach Nurse Communication: Mobility status;Precautions;Weight bearing status         Time: 1130-1204 PT Time Calculation (min) (ACUTE ONLY): 34 min   Charges:   PT  Evaluation $Initial PT Evaluation Tier I: 1 Procedure PT Treatments $Therapeutic Activity: 8-22 mins   PT G CodesRoanna Epley, SPT [phone]  10/04/2014, 2:26 PM  I have read, reviewed and agree with student's note.   [place] [phone] (pager)

## 2014-10-04 NOTE — Progress Notes (Signed)
Subjective:  No CP/SOB this AM. Day # 2 Cath/Takatsubo  Objective:  Temp:  [97.3 F (36.3 C)-98.6 F (37 C)] 98.6 F (37 C) (08/11 0400) Pulse Rate:  [28-96] 82 (08/11 0700) Resp:  [11-58] 13 (08/11 0700) BP: (48-132)/(22-64) 118/48 mmHg (08/11 0700) SpO2:  [80 %-100 %] 100 % (08/11 0700) Weight:  [136 lb 11 oz (62 kg)] 136 lb 11 oz (62 kg) (08/11 0430) Weight change: 2 lb 11 oz (1.218 kg)  Intake/Output from previous day: 08/10 0701 - 08/11 0700 In: 2724.2 [P.O.:720; I.V.:2004.2] Out: 875 [Urine:875]  Intake/Output from this shift:    Physical Exam: General appearance: alert and no distress Neck: no adenopathy, no carotid bruit, no JVD, supple, symmetrical, trachea midline and thyroid not enlarged, symmetric, no tenderness/mass/nodules Lungs: clear to auscultation bilaterally Heart: regular rate and rhythm, S1, S2 normal, no murmur, click, rub or gallop Extremities: extremities normal, atraumatic, no cyanosis or edema  Lab Results: Results for orders placed or performed during the hospital encounter of 10/02/14 (from the past 48 hour(s))  CBC with Differential     Status: None   Collection Time: 10/02/14 11:00 AM  Result Value Ref Range   WBC 7.3 4.0 - 10.5 K/uL   RBC 4.19 3.87 - 5.11 MIL/uL   Hemoglobin 12.8 12.0 - 15.0 g/dL   HCT 37.8 36.0 - 46.0 %   MCV 90.2 78.0 - 100.0 fL   MCH 30.5 26.0 - 34.0 pg   MCHC 33.9 30.0 - 36.0 g/dL   RDW 13.8 11.5 - 15.5 %   Platelets 276 150 - 400 K/uL   Neutrophils Relative % 66 43 - 77 %   Neutro Abs 4.8 1.7 - 7.7 K/uL   Lymphocytes Relative 19 12 - 46 %   Lymphs Abs 1.4 0.7 - 4.0 K/uL   Monocytes Relative 11 3 - 12 %   Monocytes Absolute 0.8 0.1 - 1.0 K/uL   Eosinophils Relative 4 0 - 5 %   Eosinophils Absolute 0.3 0.0 - 0.7 K/uL   Basophils Relative 0 0 - 1 %   Basophils Absolute 0.0 0.0 - 0.1 K/uL  Basic metabolic panel     Status: Abnormal   Collection Time: 10/02/14 11:00 AM  Result Value Ref Range   Sodium  128 (L) 135 - 145 mmol/L   Potassium 3.8 3.5 - 5.1 mmol/L   Chloride 94 (L) 101 - 111 mmol/L   CO2 25 22 - 32 mmol/L   Glucose, Bld 101 (H) 65 - 99 mg/dL   BUN 5 (L) 6 - 20 mg/dL   Creatinine, Ser 0.65 0.44 - 1.00 mg/dL   Calcium 8.8 (L) 8.9 - 10.3 mg/dL   GFR calc non Af Amer >60 >60 mL/min   GFR calc Af Amer >60 >60 mL/min    Comment: (NOTE) The eGFR has been calculated using the CKD EPI equation. This calculation has not been validated in all clinical situations. eGFR's persistently <60 mL/min signify possible Chronic Kidney Disease.    Anion gap 9 5 - 15  Urinalysis, Routine w reflex microscopic (not at [facility])     Status: Abnormal   Collection Time: 10/02/14 12:30 PM  Result Value Ref Range   Color, Urine YELLOW YELLOW   APPearance CLEAR CLEAR   Specific Gravity, Urine <1.005 (L) 1.005 - 1.030   pH 7.0 5.0 - 8.0   Glucose, UA NEGATIVE NEGATIVE mg/dL   Hgb urine dipstick NEGATIVE NEGATIVE   Bilirubin Urine NEGATIVE NEGATIVE  Ketones, ur NEGATIVE NEGATIVE mg/dL   Protein, ur NEGATIVE NEGATIVE mg/dL   Urobilinogen, UA 0.2 0.0 - 1.0 mg/dL   Nitrite POSITIVE (A) NEGATIVE   Leukocytes, UA SMALL (A) NEGATIVE  Urine microscopic-add on     Status: Abnormal   Collection Time: 10/02/14 12:30 PM  Result Value Ref Range   Squamous Epithelial / LPF RARE RARE   WBC, UA 3-6 <3 WBC/hpf   RBC / HPF 0-2 <3 RBC/hpf   Bacteria, UA MANY (A) RARE  I-Stat Troponin, ED (not at [facility])     Status: Abnormal   Collection Time: 10/02/14 12:50 PM  Result Value Ref Range   Troponin i, poc 0.27 (HH) 0.00 - 0.08 ng/mL   Comment NOTIFIED PHYSICIAN    Comment 3            Comment: Due to the release kinetics of cTnI, a negative result within the first hours of the onset of symptoms does not rule out myocardial infarction with certainty. If myocardial infarction is still suspected, repeat the test at appropriate intervals.   MRSA PCR Screening     Status: Abnormal   Collection Time: 10/02/14  3:58  PM  Result Value Ref Range   MRSA by PCR POSITIVE (A) NEGATIVE    Comment:        The GeneXpert MRSA Assay (FDA approved for NASAL specimens only), is one component of a comprehensive MRSA colonization surveillance program. It is not intended to diagnose MRSA infection nor to guide or monitor treatment for MRSA infections. RESULT CALLED TO, READ BACK BY AND VERIFIED WITH: SMITH RN 17:20 10/02/14 ([email or web address])   TSH     Status: None   Collection Time: 10/02/14  7:02 PM  Result Value Ref Range   TSH 1.246 0.350 - 4.500 uIU/mL  Troponin I     Status: Abnormal   Collection Time: 10/02/14  7:02 PM  Result Value Ref Range   Troponin I 10.26 (HH) <0.031 ng/mL    Comment:        POSSIBLE MYOCARDIAL ISCHEMIA. SERIAL TESTING RECOMMENDED. CRITICAL RESULT CALLED TO, READ BACK BY AND VERIFIED WITH: GUNDRUM R,RN 10/02/14 2224 WAYK   Troponin I     Status: Abnormal   Collection Time: 10/03/14  1:01 AM  Result Value Ref Range   Troponin I 9.94 (HH) <0.031 ng/mL    Comment:        POSSIBLE MYOCARDIAL ISCHEMIA. SERIAL TESTING RECOMMENDED. CRITICAL VALUE NOTED.  VALUE IS CONSISTENT WITH PREVIOUSLY REPORTED AND CALLED VALUE.   Basic metabolic panel     Status: Abnormal   Collection Time: 10/03/14  1:01 AM  Result Value Ref Range   Sodium 126 (L) 135 - 145 mmol/L   Potassium 4.5 3.5 - 5.1 mmol/L   Chloride 94 (L) 101 - 111 mmol/L   CO2 23 22 - 32 mmol/L   Glucose, Bld 121 (H) 65 - 99 mg/dL   BUN <5 (L) 6 - 20 mg/dL   Creatinine, Ser 0.57 0.44 - 1.00 mg/dL   Calcium 8.4 (L) 8.9 - 10.3 mg/dL   GFR calc non Af Amer >60 >60 mL/min   GFR calc Af Amer >60 >60 mL/min    Comment: (NOTE) The eGFR has been calculated using the CKD EPI equation. This calculation has not been validated in all clinical situations. eGFR's persistently <60 mL/min signify possible Chronic Kidney Disease.    Anion gap 9 5 - 15  CBC     Status: Abnormal   Collection Time:  10/03/14  1:01 AM  Result Value Ref Range     WBC 11.7 (H) 4.0 - 10.5 K/uL   RBC 3.69 (L) 3.87 - 5.11 MIL/uL   Hemoglobin 11.1 (L) 12.0 - 15.0 g/dL   HCT 33.0 (L) 36.0 - 46.0 %   MCV 89.4 78.0 - 100.0 fL   MCH 30.1 26.0 - 34.0 pg   MCHC 33.6 30.0 - 36.0 g/dL   RDW 14.0 11.5 - 15.5 %   Platelets 254 150 - 400 K/uL  Troponin I     Status: Abnormal   Collection Time: 10/03/14  2:40 PM  Result Value Ref Range   Troponin I 4.78 (HH) <0.031 ng/mL    Comment:        POSSIBLE MYOCARDIAL ISCHEMIA. SERIAL TESTING RECOMMENDED. CRITICAL VALUE NOTED.  VALUE IS CONSISTENT WITH PREVIOUSLY REPORTED AND CALLED VALUE.   Lipid panel     Status: Abnormal   Collection Time: 10/04/14  3:00 AM  Result Value Ref Range   Cholesterol 82 0 - 200 mg/dL   Triglycerides 67 <150 mg/dL   HDL 32 (L) >40 mg/dL   Total CHOL/HDL Ratio 2.6 RATIO   VLDL 13 0 - 40 mg/dL   LDL Cholesterol 37 0 - 99 mg/dL    Comment:        Total Cholesterol/HDL:CHD Risk Coronary Heart Disease Risk Table                     Men   Women  1/2 Average Risk   3.4   3.3  Average Risk       5.0   4.4  2 X Average Risk   9.6   7.1  3 X Average Risk  23.4   11.0        Use the calculated Patient Ratio above and the CHD Risk Table to determine the patient's CHD Risk.        ATP III CLASSIFICATION (LDL):  <100     mg/dL   Optimal  100-129  mg/dL   Near or Above                    Optimal  130-159  mg/dL   Borderline  160-189  mg/dL   High  >190     mg/dL   Very High     Imaging: Imaging results have been reviewed  Assessment/Plan:   1. Principal Problem: 2.   Stress-induced cardiomyopathy 3. Active Problems: 4.   HTN (hypertension) 5.   Takotsubo cardiomyopathy 6.   Acute MI 7.   Foot fracture 8.   Demand ischemia 9.   Time Spent Directly with Patient:  20 minutes  Length of Stay:  LOS: 2 days    Day # 2 cath/ Takatsubo. 2D consistent with , Major issue has been decreased BP. Now on low levophed with BP in mid 90s (6 ug). Coreg and lisinopril on hold.  Will increase IVF (2.3 L +). Exam benign. Lungs clear. L ankle sutured by ortho yesterday. Labs OK. Keep in ICU today. Attempt pressor wean tomorrow.   Quay Burow 10/04/2014, 8:09 AM

## 2014-10-05 DIAGNOSIS — I2109 ST elevation (STEMI) myocardial infarction involving other coronary artery of anterior wall: Secondary | ICD-10-CM

## 2014-10-05 LAB — BASIC METABOLIC PANEL
Anion gap: 7 (ref 5–15)
CHLORIDE: 99 mmol/L — AB (ref 101–111)
CO2: 23 mmol/L (ref 22–32)
CREATININE: 0.56 mg/dL (ref 0.44–1.00)
Calcium: 7.8 mg/dL — ABNORMAL LOW (ref 8.9–10.3)
GFR calc non Af Amer: >60 mL/min (ref >60)
GLUCOSE: 121 mg/dL — AB (ref 65–99)
POTASSIUM: 3.8 mmol/L (ref 3.5–5.1)
Sodium: 129 mmol/L — ABNORMAL LOW (ref 135–145)

## 2014-10-05 MED ORDER — PHENAZOPYRIDINE HCL 100 MG PO TABS
100.0000 mg | ORAL_TABLET | Freq: Three times a day (TID) | ORAL | Status: DC
  Administered 2014-10-05 – 2014-10-06 (×3): 100 mg via ORAL
  Filled 2014-10-05 (×6): qty 1

## 2014-10-05 NOTE — Progress Notes (Signed)
Patient Profile: 80 y.o. female with a history of Takotsubo CM, hypothyroid, anemia and shingles. EF 60% in 2012. Initially presented to [facility] after mechanical fall/ ankle fracture. Later developed CP, ST elevations and elevated troponin. Transferred to [facility] 10/02/14 as a code STEMI. Cath c/w Takotsubo.  Subjective: Denies CP or dyspnea. Notes frequent urination. Still with left foot pain.   Objective: Vital signs in last 24 hours: Temp:  [97.9 F (36.6 C)-98.9 F (37.2 C)] 98.9 F (37.2 C) (08/12 0400) Pulse Rate:  [62-111] 94 (08/12 0700) Resp:  [12-22] 17 (08/12 0700) BP: (79-132)/(36-91) 127/49 mmHg (08/12 0700) SpO2:  [94 %-100 %] 97 % (08/12 0700) Weight:  [138 lb 14.2 oz (63 kg)] 138 lb 14.2 oz (63 kg) (08/12 0400) Last BM Date: 10/04/14  Intake/Output from previous day: 08/11 0701 - 08/12 0700 In: 2329 [P.O.:700; I.V.:1629] Out: 291 [Urine:290; Stool:1] Intake/Output this shift:    Medications Current Facility-Administered Medications  Medication Dose Route Frequency Provider Last Rate Last Dose  . 0.9 %  sodium chloride infusion  250 mL Intravenous PRN Peter M Swazil[place], MD      . 0.9 %  sodium chloride infusion   Intravenous Continuous Runell Gess, MD 75 mL/hr at 10/05/14 0615    . acetaminophen (TYLENOL) tablet 650 mg  650 mg Oral Q4H PRN Joline Salt Barrett, PA-C   650 mg at 10/04/14 2329  . ALPRAZolam Prudy Feeler) tablet 0.25 mg  0.25 mg Oral BID PRN Joline Salt Barrett, PA-C      . aspirin chewable tablet 81 mg  81 mg Oral Daily Rhonda G Barrett, PA-C   81 mg at 10/04/14 1100  . Chlorhexidine Gluconate Cloth 2 % PADS 6 each  6 each Topical Q0600 Peter M [place]azil[place], MD   6 each at 10/05/14 0600  . ciprofloxacin (CIPRO) tablet 250 mg  250 mg Oral BID Peter M [place]az[place], MD   250 mg at 10/04/14 1939  . HYDROcodone-acetaminophen (NORCO/VICODIN) 5-325 MG per tablet 1 tablet  1 tablet Oral Q6H PRN Darrol Jump, PA-C   1 tablet at 10/05/14 0403  . hydrocortisone cream 1 %   Topical Q1H  PRN Runell Gess, MD   1 application at 10/03/14 2223  . hydrOXYzine (ATARAX/VISTARIL) tablet 10-20 mg  10-20 mg Oral QHS PRN Joline Salt Barrett, PA-C   20 mg at 10/04/14 2130  . lisinopril (PRINIVIL,ZESTRIL) tablet 2.5 mg  2.5 mg Oral Daily Runell Gess, MD   2.5 mg at 10/03/14 1000  . mupirocin ointment (BACTROBAN) 2 % 1 application  1 application Nasal BID Peter M [place], MD   1 application at 10/04/14 2130  . nitroGLYCERIN (NITROSTAT) SL tablet 0.4 mg  0.4 mg Sublingual Q5 Min x 3 PRN Rhonda G Barrett, PA-C      . norepinephrine (LEVOPHED) 16 mg in dextrose 5 % 250 mL (0.064 mg/mL) infusion  0-40 mcg/min Intravenous Titrated Peter M [place]az[place], MD 5.6 mL/hr at 10/05/14 0615 6 mcg/min at 10/05/14 0615  . ondansetron (ZOFRAN) injection 4 mg  4 mg Intravenous Q6H PRN Rhonda G Barrett, PA-C      . rosuvastatin (CRESTOR) tablet 40 mg  40 mg Oral QHS Rhonda G Barrett, PA-C   40 mg at 10/04/14 2130  . sodium chloride 0.9 % injection 3 mL  3 mL Intravenous Q12H Peter M [place]azil[place], MD   3 mL at 10/04/14 2100  . sodium chloride 0.9 % injection 3 mL  3 mL Intravenous PRN Demetria Pore  [place], MD      . zolpidem (AMBIEN) tablet 5 mg  5 mg Oral QHS PRN Darrol Jump, PA-C        PE: General appearance: alert, cooperative and no distress Neck: no carotid bruit and no JVD Lungs: clear to auscultation bilaterally Heart: regular rate and rhythm, S1, S2 normal, no murmur, click, rub or gallop Extremities: no LEE; boot on left LE Pulses: 2+ and symmetric Skin: warm and dry Neurologic: Grossly normal  Lab Results:   Recent Labs  10/02/14 1100 10/03/14 0101  WBC 7.3 11.7*  HGB 12.8 11.1*  HCT 37.8 33.0*  PLT 276 254   BMET  Recent Labs  10/02/14 1100 10/03/14 0101 10/05/14 0216  NA 128* 126* 129*  K 3.8 4.5 3.8  CL 94* 94* 99*  CO2 GLUCOSE 101* 121* 121*  BUN 5* <5* <5*  CREATININE 0.65 0.57 0.56  CALCIUM 8.8* 8.4* 7.8*   PT/INR No results for input(s): LABPROT, INR in the last  72 hours. Cholesterol  Recent Labs  10/04/14 0300  CHOL 82    Assessment/Plan  Principal Problem:   Stress-induced cardiomyopathy Active Problems:   HTN (hypertension)   Takotsubo cardiomyopathy   Acute MI   Foot fracture   Demand ischemia  1. Stress-Induced Cardiomyopathy: Day # 3 s/p cath revealing normal coronaries. 2D echo consistent withTakatsubo.  Major issue has been decreased BP. She remains on  Levophed. Unable to wean yesterday. RN reports Levophed was increased back up to 10 mcg/min overnight. She is now on 6 mcg/min. Try to continue to wean. Coreg and lisinopril on hold.   2. Left Ankle Fracture: L ankle sutured by ortho. In boot and stable. PNR pain meds ordered.  3. UTI: on cipro. Will given PRN meds for bladder spasms.   Get out of bed today. Try to wean levo. Will get PT to see today. Hopeful transfer to telemetry tomorrow.   LOS: 3 days    Brittainy M. Delmer Islam 10/05/2014 7:36 AM    Agree with note written by Boyce Medici  PAC  Day # STEMI/Takatsubo. No Cp/SOB. Major issue is BP. Getting IVF. Lungs clear. Still on Levophed. BB and ACE-I held secondary to hypotension. OOB to chair. CRH/ambulate (carefull of left ankle). Wean  levophed as tolerated. If we get her off pressors today can Tx step down tomorrow.   Nanetta Batty 10/05/2014 10:47 AM

## 2014-10-06 DIAGNOSIS — I959 Hypotension, unspecified: Secondary | ICD-10-CM

## 2014-10-06 LAB — BASIC METABOLIC PANEL
ANION GAP: 8 (ref 5–15)
CO2: 22 mmol/L (ref 22–32)
CREATININE: 0.59 mg/dL (ref 0.44–1.00)
Calcium: 7.5 mg/dL — ABNORMAL LOW (ref 8.9–10.3)
Chloride: 96 mmol/L — ABNORMAL LOW (ref 101–111)
GFR calc Af Amer: >60 mL/min (ref >60)
GFR calc non Af Amer: >60 mL/min (ref >60)
Glucose, Bld: 118 mg/dL — ABNORMAL HIGH (ref 65–99)
Potassium: 3.6 mmol/L (ref 3.5–5.1)
Sodium: 126 mmol/L — ABNORMAL LOW (ref 135–145)

## 2014-10-06 LAB — HEPATIC FUNCTION PANEL
ALBUMIN: 2 g/dL — AB (ref 3.5–5.0)
ALK PHOS: 62 U/L (ref 38–126)
ALT: 15 U/L (ref 14–54)
AST: 26 U/L (ref 15–41)
BILIRUBIN INDIRECT: 0 mg/dL — AB (ref 0.3–0.9)
BILIRUBIN TOTAL: 0.2 mg/dL — AB (ref 0.3–1.2)
Bilirubin, Direct: 0.2 mg/dL (ref 0.1–0.5)
Total Protein: 4.7 g/dL — ABNORMAL LOW (ref 6.5–8.1)

## 2014-10-06 NOTE — Progress Notes (Signed)
Patient Profile: 80 y.o. female with a history of Takotsubo CM, hypothyroid, anemia and shingles. EF 60% in 2012. Initially presented to [facility] after mechanical fall/ ankle fracture. Later developed CP, ST elevations and elevated troponin. Transferred to [facility] 10/02/14 as a code STEMI. Cath c/w Takotsubo.   Subjective: Still on low-dose norepi to support BP. Denies CP or dyspnea. Getting NS at 75cc/hr. Levophed down to 3. SBP 120.   Objective: Vital signs in last 24 hours: Temp:  [97.6 F (36.4 C)-100.9 F (38.3 C)] 97.6 F (36.4 C) (08/13 0809) Pulse Rate:  [80-172] 103 (08/13 0809) Resp:  [15-106] 106 (08/13 0809) BP: (68-133)/(29-102) 105/50 mmHg (08/13 0809) SpO2:  [91 %-100 %] 100 % (08/13 0809) Weight:  [62 kg (136 lb 11 oz)] 62 kg (136 lb 11 oz) (08/13 0311) Last BM Date: 10/04/14  Intake/Output from previous day: 08/12 0701 - 08/13 0700 In: 2619.3 [P.O.:720; I.V.:1899.3] Out: 450 [Urine:450] Intake/Output this shift:    Medications Current Facility-Administered Medications  Medication Dose Route Frequency Provider Last Rate Last Dose  . 0.9 %  sodium chloride infusion  250 mL Intravenous PRN Peter M [place], MD      . 0.9 %  sodium chloride infusion   Intravenous Continuous Runell Gess, MD 75 mL/hr at 10/05/14 2304    . acetaminophen (TYLENOL) tablet 650 mg  650 mg Oral Q4H PRN Joline Salt Barrett, PA-C   650 mg at 10/06/14 0157  . ALPRAZolam Prudy Feeler) tablet 0.25 mg  0.25 mg Oral BID PRN Joline Salt Barrett, PA-C   0.25 mg at 10/06/14 0239  . aspirin chewable tablet 81 mg  81 mg Oral Daily Rhonda G Barrett, PA-C   81 mg at 10/05/14 0945  . Chlorhexidine Gluconate Cloth 2 % PADS 6 each  6 each Topical Q0600 Peter M [place], MD   6 each at 10/06/14 [phone]  . ciprofloxacin (CIPRO) tablet 250 mg  250 mg Oral BID Peter M [place], MD   250 mg at 10/06/14 8250  . HYDROcodone-acetaminophen (NORCO/VICODIN) 5-325 MG per tablet 1 tablet  1 tablet Oral Q6H PRN Darrol Jump, PA-C   1  tablet at 10/06/14 0338  . hydrocortisone cream 1 %   Topical Q1H PRN Runell Gess, MD   1 application at 10/03/14 2223  . hydrOXYzine (ATARAX/VISTARIL) tablet 10-20 mg  10-20 mg Oral QHS PRN Joline Salt Barrett, PA-C   20 mg at 10/04/14 2130  . lisinopril (PRINIVIL,ZESTRIL) tablet 2.5 mg  2.5 mg Oral Daily Runell Gess, MD   2.5 mg at 10/03/14 1000  . mupirocin ointment (BACTROBAN) 2 % 1 application  1 application Nasal BID Peter M [place], MD   1 application at 10/05/14 2106  . nitroGLYCERIN (NITROSTAT) SL tablet 0.4 mg  0.4 mg Sublingual Q5 Min x 3 PRN Rhonda G Barrett, PA-C      . norepinephrine (LEVOPHED) 16 mg in dextrose 5 % 250 mL (0.064 mg/mL) infusion  0-40 mcg/min Intravenous Titrated Peter M [place], MD 3.8 mL/hr at 10/05/14 1900 4 mcg/min at 10/05/14 1900  . ondansetron (ZOFRAN) injection 4 mg  4 mg Intravenous Q6H PRN Rhonda G Barrett, PA-C      . phenazopyridine (PYRIDIUM) tablet 100 mg  100 mg Oral TID WC Brittainy Sherlynn Carbon, PA-C   100 mg at 10/06/14 0370  . rosuvastatin (CRESTOR) tablet 40 mg  40 mg Oral QHS Rhonda G Barrett, PA-C   40 mg at 10/05/14 2106  . sodium chloride 0.9 %  injection 3 mL  3 mL Intravenous Q12H Peter M [place]az[place], MD   3 mL at 10/05/14 2108  . sodium chloride 0.9 % injection 3 mL  3 mL Intravenous PRN Peter M [place], MD      . zolpidem [facility]) tablet 5 mg  5 mg Oral QHS PRN Darrol Jump, PA-C        PE: General appearance: alert, cooperative and no distress Neck: no carotid bruit and no JVD Lungs: clear to auscultation bilaterally Heart: regular rate and rhythm, S1, S2 normal, no murmur, click, rub or gallop Extremities: no LEE; boot on left LE Pulses: 2+ and symmetric Skin: warm and dry Neurologic: Grossly normal  Lab Results:  No results for input(s): WBC, HGB, HCT, PLT in the last 72 hours. BMET  Recent Labs  10/05/14 0216 10/06/14 0216  NA 129* 126*  K 3.8 3.6  CL 99* 96*  CO2 23 22  GLUCOSE 121* 118*  BUN <5* <5*  CREATININE  0.56 0.59  CALCIUM 7.8* 7.5*   PT/INR No results for input(s): LABPROT, INR in the last 72 hours. Cholesterol  Recent Labs  10/04/14 0300  CHOL 82    Assessment/Plan  Principal Problem:   Stress-induced cardiomyopathy Active Problems:   HTN (hypertension)   Takotsubo cardiomyopathy   Acute MI   Foot fracture   Demand ischemia  1. Stress-Induced Cardiomyopathy: Day # 4 s/p cath revealing normal coronaries. 2D echo consistent withTakatsubo.  Major issue has been decreased BP. Now weaning levophed. Suspect we will be able to stop today. Would stop IIVF as well. Once BP stable can transfer to SDU and start carvedilol 3.125 bid.   2. Left Ankle Fracture: L ankle sutured by ortho. In boot and stable. PRN pain meds .  3. UTI: on cipro now on day #5. No culture sent. Can stop cipro.   4. Hyponatremia   LOS: 4 days   Charlis Harner,MD 10:59 AM

## 2014-10-07 ENCOUNTER — Inpatient Hospital Stay (HOSPITAL_COMMUNITY): Payer: Medicare Other

## 2014-10-07 DIAGNOSIS — R509 Fever, unspecified: Secondary | ICD-10-CM | POA: Diagnosis present

## 2014-10-07 LAB — URINALYSIS, ROUTINE W REFLEX MICROSCOPIC
Bilirubin Urine: NEGATIVE
GLUCOSE, UA: NEGATIVE mg/dL
HGB URINE DIPSTICK: NEGATIVE
KETONES UR: 15 mg/dL — AB
NITRITE: POSITIVE — AB
PROTEIN: NEGATIVE mg/dL
SPECIFIC GRAVITY, URINE: 1.016 (ref 1.005–1.030)
UROBILINOGEN UA: 1 mg/dL (ref 0.0–1.0)
pH: 5 (ref 5.0–8.0)

## 2014-10-07 LAB — BASIC METABOLIC PANEL
Anion gap: 7 (ref 5–15)
BUN: 7 mg/dL (ref 6–20)
CALCIUM: 7.8 mg/dL — AB (ref 8.9–10.3)
CO2: 23 mmol/L (ref 22–32)
CREATININE: 0.63 mg/dL (ref 0.44–1.00)
Chloride: 96 mmol/L — ABNORMAL LOW (ref 101–111)
GFR calc Af Amer: >60 mL/min (ref >60)
GFR calc non Af Amer: >60 mL/min (ref >60)
Glucose, Bld: 114 mg/dL — ABNORMAL HIGH (ref 65–99)
Potassium: 3.6 mmol/L (ref 3.5–5.1)
SODIUM: 126 mmol/L — AB (ref 135–145)

## 2014-10-07 LAB — CBC
HCT: 27.6 % — ABNORMAL LOW (ref 36.0–46.0)
HEMOGLOBIN: 9.3 g/dL — AB (ref 12.0–15.0)
MCH: 30.1 pg (ref 26.0–34.0)
MCHC: 33.7 g/dL (ref 30.0–36.0)
MCV: 89.3 fL (ref 78.0–100.0)
PLATELETS: 286 10*3/uL (ref 150–400)
RBC: 3.09 MIL/uL — ABNORMAL LOW (ref 3.87–5.11)
RDW: 14.4 % (ref 11.5–15.5)
WBC: 11.8 10*3/uL — ABNORMAL HIGH (ref 4.0–10.5)

## 2014-10-07 LAB — SODIUM, URINE, RANDOM: SODIUM UR: 43 mmol/L

## 2014-10-07 LAB — OSMOLALITY: OSMOLALITY: 272 mosm/kg — AB (ref 275–300)

## 2014-10-07 LAB — CREATININE, URINE, RANDOM: Creatinine, Urine: 134.14 mg/dL

## 2014-10-07 LAB — URINE MICROSCOPIC-ADD ON

## 2014-10-07 LAB — TSH: TSH: 4.635 u[IU]/mL — ABNORMAL HIGH (ref 0.350–4.500)

## 2014-10-07 LAB — LACTIC ACID, PLASMA: Lactic Acid, Venous: 1.1 mmol/L (ref 0.5–2.0)

## 2014-10-07 MED ORDER — CARVEDILOL 3.125 MG PO TABS
3.1250 mg | ORAL_TABLET | Freq: Two times a day (BID) | ORAL | Status: DC
  Administered 2014-10-07 – 2014-10-13 (×11): 3.125 mg via ORAL
  Filled 2014-10-07 (×16): qty 1

## 2014-10-07 MED ORDER — OXYCODONE-ACETAMINOPHEN 5-325 MG PO TABS
1.0000 | ORAL_TABLET | ORAL | Status: DC | PRN
  Administered 2014-10-07 – 2014-10-12 (×7): 1 via ORAL
  Filled 2014-10-07 (×8): qty 1

## 2014-10-07 MED ORDER — MORPHINE SULFATE 2 MG/ML IJ SOLN
1.0000 mg | Freq: Once | INTRAMUSCULAR | Status: AC
  Administered 2014-10-07: 1 mg via INTRAVENOUS
  Filled 2014-10-07: qty 1

## 2014-10-07 NOTE — Progress Notes (Addendum)
Patient Profile: 80 y.o. female with a history of Takotsubo CM, hypothyroid, anemia and shingles. EF 60% in 2012. Initially presented to [facility] after mechanical fall/ ankle fracture. Later developed CP, ST elevations and elevated troponin. Transferred to [facility] 10/02/14 as a code STEMI. Cath c/w Takotsubo.   Subjective: Off norepi and IVF. BP improved.  Denies CP or dyspnea. Continues to complain of pain not controlled with Vicodin. Has not ambulated.   Objective: Vital signs in last 24 hours: Temp:  [98.9 F (37.2 C)-101.1 F (38.4 C)] 99.4 F (37.4 C) (08/14 0831) Pulse Rate:  [88-121] 110 (08/14 1100) Resp:  [15-23] 17 (08/14 1100) BP: (65-132)/(38-97) 120/87 mmHg (08/14 1100) SpO2:  [94 %-100 %] 94 % (08/14 1100) Weight:  [62.8 kg (138 lb 7.2 oz)] 62.8 kg (138 lb 7.2 oz) (08/14 0200) Last BM Date: 10/05/14  Intake/Output from previous day: 08/13 0701 - 08/14 0700 In: 770.1 [P.O.:480; I.V.:290.1] Out: 375 [Urine:375] Intake/Output this shift: Total I/O In: 363 [P.O.:360; I.V.:3] Out: -   Medications Current Facility-Administered Medications  Medication Dose Route Frequency Provider Last Rate Last Dose  . 0.9 %  sodium chloride infusion  250 mL Intravenous PRN Peter M Swaziland, MD   Stopped at 10/07/14 0200  . acetaminophen (TYLENOL) tablet 650 mg  650 mg Oral Q4H PRN Joline Salt Barrett, PA-C   650 mg at 10/06/14 2003  . ALPRAZolam Prudy Feeler) tablet 0.25 mg  0.25 mg Oral BID PRN Joline Salt Barrett, PA-C   0.25 mg at 10/06/14 0239  . aspirin chewable tablet 81 mg  81 mg Oral Daily Rhonda G Barrett, PA-C   81 mg at 10/07/14 1024  . HYDROcodone-acetaminophen (NORCO/VICODIN) 5-325 MG per tablet 1 tablet  1 tablet Oral Q6H PRN Darrol Jump, PA-C   1 tablet at 10/07/14 1024  . hydrocortisone cream 1 %   Topical Q1H PRN Runell Gess, MD   1 application at 10/03/14 2223  . hydrOXYzine (ATARAX/VISTARIL) tablet 10-20 mg  10-20 mg Oral QHS PRN Joline Salt Barrett, PA-C   20 mg at 10/04/14 2130    . lisinopril (PRINIVIL,ZESTRIL) tablet 2.5 mg  2.5 mg Oral Daily Runell Gess, MD   2.5 mg at 10/07/14 1024  . nitroGLYCERIN (NITROSTAT) SL tablet 0.4 mg  0.4 mg Sublingual Q5 Min x 3 PRN Rhonda G Barrett, PA-C      . norepinephrine (LEVOPHED) 16 mg in dextrose 5 % 250 mL (0.064 mg/mL) infusion  0-40 mcg/min Intravenous Titrated Peter M Swaziland, MD   Stopped at 10/07/14 0200  . ondansetron (ZOFRAN) injection 4 mg  4 mg Intravenous Q6H PRN Rhonda G Barrett, PA-C      . rosuvastatin (CRESTOR) tablet 40 mg  40 mg Oral QHS Rhonda G Barrett, PA-C   40 mg at 10/06/14 2143  . sodium chloride 0.9 % injection 3 mL  3 mL Intravenous Q12H Peter M Swaziland, MD   3 mL at 10/07/14 1026  . sodium chloride 0.9 % injection 3 mL  3 mL Intravenous PRN Peter M Swaziland, MD      . zolpidem [facility]) tablet 5 mg  5 mg Oral QHS PRN Darrol Jump, PA-C        PE: General appearance: alert, cooperative and no distress Neck: no carotid bruit and no JVD Lungs: clear to auscultation bilaterally Heart: regular rate and rhythm, S1, S2 normal, no murmur, click, rub or gallop Extremities: no LEE; boot on left LE Pulses: 2+ and symmetric Skin: warm  and dry Neurologic: Grossly normal  Lab Results:  No results for input(s): WBC, HGB, HCT, PLT in the last 72 hours. BMET  Recent Labs  10/05/14 0216 10/06/14 0216 10/07/14 0227  NA 129* 126* 126*  K 3.8 3.6 3.6  CL 99* 96* 96*  CO2 23 22 23   GLUCOSE 121* 118* 114*  BUN <5* <5* 7  CREATININE 0.56 0.59 0.63  CALCIUM 7.8* 7.5* 7.8*   PT/INR No results for input(s): LABPROT, INR in the last 72 hours. Cholesterol No results for input(s): CHOL in the last 72 hours.  Assessment/Plan  Principal Problem:   Stress-induced cardiomyopathy Active Problems:   HTN (hypertension)   Takotsubo cardiomyopathy   Acute MI   Foot fracture   Demand ischemia  1. Stress-Induced Cardiomyopathy: Day # 5 s/p cath revealing normal coronaries. 2D echo consistent withTakatsubo.   Major issue has been decreased BP. Now off IVF and levophed. Can go to tele. IF BP stable throughout day will start low-dose carvedilol  2. Left Ankle Fracture: L ankle sutured by ortho. In boot and stable. Still with pain despite vicodin .PT consult placed.  3. UTI: Cipro stopped yesterday   4. Hyponatremia  Main issues now are recent ankle fracture with poor mobility and ongoing pain. Will ask [facility] to consult and assume care.  PT consult ordered. May need short-term SNF.    LOS: 5 days   Prophet Renwick,MD 11:51 AM

## 2014-10-07 NOTE — Progress Notes (Signed)
Pt having problems voiding. Bladder scan done; shows 800-900 cc's in bladder.  C/O medication not helping her left foot.  Dr. Arbutus Leas notified. Orders received.

## 2014-10-07 NOTE — Consult Note (Addendum)
Medical Consultation  Charlene Vincent JWL:[id number] DOB: [birth year] DOA: 10/02/2014 PCP: Pamelia Hoit, MD   Requesting physician: Dr. Gala Romney Date of consultation: 10/07/14 Reason for consultation: fever  Impression/Recommendations Fever -Suspect that this is related to the patient's hematoma on her left lower extremity from her recent injury -The patient does not appear clinically toxic although her blood pressure remains somewhat soft -Blood cultures 2 sets -UA and urine culture -Chest x-ray -Lactic acid -CBC -We will not start antibiotics at this time Deconditioning -PT has recommended skilled nursing facility -After discussion with the patient today, she appears to be agreeable -Consult social work for skilled nursing facility placement Stressed induced cardiomyopathy -Continue carvedilol and lisinopril -Management per cardiology -10/02/2014 cardiac catheterization--normal coronaries Left ankle fracture -Follow recommendations per orthopedic surgery -Remain in Cam Boot Hypothyroidism -Check TSH Hyperlipidemia -Continue statin Hyponatremia -Chronic dating back to July 2012 -Na 126-130 -urine osm, serum osm, urine Na, urine creatinine Urine retention -placed foley -will need voiding trial prior to d/c I will followup again tomorrow. Please contact me if I can be of assistance in the meanwhile. Thank you for this consultation.  Chief Complaint: Fever   HPI:  80 year old female with a history of Takotsubo cardiomyopathy, hypothyroidism, and anemia presented to the ED at [facility] secondary to a mechanical fall resulting in a left ankle fracture. X-rays confirmed a nondisplaced left malleolar fracture. Apparently, there was concern the patient may have had a dysrhythmia on the monitor. An EKG was performed and showed ST elevation. There was concern for STEMI. The patient was transferred to [facility] can and she was taken directly to the Cath Lab. Heart catheterization  revealed normal coronaries and consistent with Takotsubo cardiomyopathy. Apparently, the patient had hypotension and was on Levophed for short period of time. She has been since weaned off of Levophed and is being moved out of stepdown. The patient was seen by orthopedics, Dr. August Saucer. He saw the patient on 10/03/2014 and repaired her left ankle wound. He recommended a period of 3 weeks of nonweightbearing. Unfortunately, the patient developed a fever up to 101.57F on 10/06/2014. The patient continued to have a fever on 10/07/2014. The patient was initially placed on ciprofloxacin from 10/02/2014 through 10/06/2014. Initial urinalysis did not show significant pyuria. Patient denies fevers, chills, headache, chest pain, dyspnea, nausea, vomiting, diarrhea, abdominal pain, dysuria, hematuria. Her appetite has been fair, but she has progressed poorly with physical therapy.  Review of Systems:  Constitutional:  No weight loss, night sweats,  chills, fatigue.  Head&Eyes: No headache.  No vision loss.  No eye pain or scotoma ENT:  No Difficulty swallowing,Tooth/dental problems,Sore throat,  No ear ache, post nasal drip,  Cardio-vascular:  No chest pain, Orthopnea, PND, swelling in lower extremities,  dizziness, palpitations  GI:  No heartburn, indigestion, abdominal pain, nausea, vomiting, diarrhea, loss of appetite, hematochezia, melena Resp:  No shortness of breath with exertion or at rest. No excess mucus, no productive cough, No non-productive cough, No coughing up of blood.No change in color of mucus.No wheezing.No chest wall deformity  Skin:  no rash or lesions.  GU:  no dysuria, change in color of urine, no urgency or frequency. No flank pain.  Musculoskeletal:  No joint pain or swelling. No decreased range of motion. No back pain.  Psych:  No change in mood or affect. No depression or anxiety. Neurologic: No headache, no dysesthesia, no focal weakness, no vision loss. No syncope   Past  Medical History  Diagnosis Date  .  Takotsubo cardiomyopathy EF 60%- 12/2010    s/p cath July 2012. EF is 35 to 40%. 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 Hypothyroidism   . Anemia   . Costochondritis   . Arthritis   . Cellulitis   . Shingles    Past Surgical History  Procedure Laterality Date  . Inguinal hernia repair    . Cardiac catheterization  September 10, 2010    EF 35 to 40%. No significant CAD. ? takotsubo cardiomyopathy  . Cardiac catheterization N/A 10/02/2014    Procedure: Left Heart Cath and Coronary Angiography;  Surgeon: Peter M Swaziland, MD;  Location: [facility] INVASIVE CV LAB;  Service: Cardiovascular;  Laterality: N/A;   Social History:  reports that she quit smoking about 4 years ago. Her smoking use included Cigarettes. She smoked 0.50 packs per day. She does not have any smokeless tobacco history on file. She reports that she does not drink alcohol or use illicit drugs.  Family History  Problem Relation Age of Onset  . Heart disease Father   . Heart disease Brother     Allergies  Allergen Reactions  . Aspirin     But taking low dose therapy without problems.  . Penicillins Rash     Prior to Admission medications   Medication Sig Start Date End Date Taking? Authorizing Provider  acetaminophen (TYLENOL) 500 MG tablet Take 500 mg by mouth 6 (six) times daily.    Yes Historical Provider, MD  HYDROcodone-acetaminophen (NORCO/VICODIN) 5-325 MG per tablet Take 1 tablet by mouth every 6 (six) hours as needed for moderate pain.   Yes Historical Provider, MD  hydrOXYzine (ATARAX/VISTARIL) 10 MG tablet 1 or 2 po at hs, or  q6h prn itching Patient taking differently: Take 10-20 mg by mouth at bedtime as needed for itching (may take evey 6 hours as needed  for itching).  09/16/14  Yes Ivery Quale, PA-C  naproxen (NAPROSYN) 500 MG tablet Take 1 tablet (500 mg total) by mouth 2 (two) times daily with a meal. Patient not taking: Reported on 10/02/2014 09/17/14   Eber Hong,  MD  rosuvastatin (CRESTOR) 10 MG tablet Take 1 tablet (10 mg total) by mouth at bedtime. 03/06/13 03/06/14  Peter M Swaziland, MD    Physical Exam: Filed Vitals:   10/07/14 0900 10/07/14 1000 10/07/14 1100 10/07/14 1215  BP: 113/97 120/57 120/87 91/51  Pulse: 108 113 110 106  Temp:    98.8 F (37.1 C)  TempSrc:   Oral Oral  Resp: Height:      Weight:      SpO2: 95% 94% 94% 96%   General:  A&O x 3, NAD, nontoxic, pleasant/cooperative Head/Eye: No conjunctival hemorrhage, no icterus, [place]/AT, No nystagmus ENT:  No icterus,  No thrush, good dentition, no pharyngeal exudate Neck:  No masses, no lymphadenpathy, no bruits CV:  RRR, no rub, no gallop, no S3 Lung:  CTAB, good air movement, no wheeze, no rhonchi Abdomen: soft/NT, +BS, nondistended, no peritoneal signs Ext: Left foot in Cam boot with edema and ecchymosis   Labs on Admission:  Basic Metabolic Panel:  Recent Labs Lab 10/02/14 1100 10/03/14 0101 10/05/14 0216 10/06/14 0216 10/07/14 0227  NA 128* 126* 129* 126* 126*  K 3.8 4.5 3.8 3.6 3.6  CL 94* 94* 99* 96* 96*  CO2 GLUCOSE 101* 121* 121* 118* 114*  BUN 5* <5* <5* <5* 7  CREATININE 0.65 0.57 0.56 0.59  0.63  CALCIUM 8.8* 8.4* 7.8* 7.5* 7.8*   Liver Function Tests:  Recent Labs Lab 10/06/14 0216  AST 26  ALT 15  ALKPHOS 62  BILITOT 0.2*  PROT 4.7*  ALBUMIN 2.0*   No results for input(s): LIPASE, AMYLASE in the last 168 hours. No results for input(s): AMMONIA in the last 168 hours. CBC:  Recent Labs Lab 10/02/14 1100 10/03/14 0101  WBC 7.3 11.7*  NEUTROABS 4.8  --   HGB 12.8 11.1*  HCT 37.8 33.0*  MCV 90.2 89.4  PLT 276 254   Cardiac Enzymes:  Recent Labs Lab 10/02/14 1902 10/03/14 0101 10/03/14 1440  TROPONINI 10.26* 9.94* 4.78*   BNP: Invalid input(s): POCBNP CBG: No results for input(s): GLUCAP in the last 168 hours.  Radiological Exams on Admission: No results found.    Time spent: 60  minutes  Carleah Yablonski [facility] Pager [phone]  If 7PM-7AM, please contact night-coverage [email or web address] Password [facility] 10/07/2014, 12:23 PM

## 2014-10-08 DIAGNOSIS — R509 Fever, unspecified: Secondary | ICD-10-CM

## 2014-10-08 DIAGNOSIS — E871 Hypo-osmolality and hyponatremia: Secondary | ICD-10-CM

## 2014-10-08 LAB — BASIC METABOLIC PANEL
ANION GAP: 6 (ref 5–15)
BUN: 8 mg/dL (ref 6–20)
CO2: 25 mmol/L (ref 22–32)
Calcium: 7.7 mg/dL — ABNORMAL LOW (ref 8.9–10.3)
Chloride: 95 mmol/L — ABNORMAL LOW (ref 101–111)
Creatinine, Ser: 0.59 mg/dL (ref 0.44–1.00)
GFR calc Af Amer: >60 mL/min (ref >60)
GLUCOSE: 92 mg/dL (ref 65–99)
Potassium: 3.5 mmol/L (ref 3.5–5.1)
Sodium: 126 mmol/L — ABNORMAL LOW (ref 135–145)

## 2014-10-08 LAB — OSMOLALITY, URINE: OSMOLALITY UR: 577 mosm/kg (ref 390–1090)

## 2014-10-08 NOTE — Progress Notes (Signed)
Physical Therapy Treatment Patient Details Name: Charlene Vincent MRN: [id number] DOB: April 12, 1934 Today's Date: 10/08/2014    History of Present Illness 80 y.o. female with a history of Takotsubo CM. She went to [facility] ER with a foot injury from a mechanical fall. Her foot was treated and a laceration was repaired. She has a large pedal hematoma as well as foot/ankle fractures. Pt was on the monitor and there was concern for arrhythmia. An ECG was performed and was significantly abnormal. The ECG met STEMI criteria and she was transferred emergently to [facility] and taken directly to the cath lab.    PT Comments    Patient seated in recliner and agreeable to participate in PT today. Patient participated in transfers and exercise as described below, and was prescribed exercises to perform for L LE throughout the day when not being seen by PT. RN made aware of dried blood present on bandage. She will benefit from continued PT to improve transfers and compliance of NWB precautions during transfers.  Follow Up Recommendations  SNF;Supervision/Assistance - 24 hour;Supervision for mobility/OOB     Equipment Recommendations  Other (comment) (TBA per pt compliance with NWB precautions)    Recommendations for Other Services       Precautions / Restrictions Precautions Precautions: Fall Restrictions Weight Bearing Restrictions: Yes LLE Weight Bearing: Non weight bearing (L LE)    Mobility  Bed Mobility               General bed mobility comments: Patient found seated in recliner finishing lunch  Transfers Overall transfer level: Needs assistance Equipment used: 1 person hand held assist (Bed Rails) Transfers: Sit to/from Stand Sit to Stand: Max assist;+2 safety/equipment         General transfer comment: Patient required L LE assist to follow NWB precautions as well as use of bed rails and max A x1 physical assistance to stand from the recliner. This was performed  2x.  Ambulation/Gait                 Stairs            Wheelchair Mobility    Modified Rankin (Stroke Patients Only)       Balance Overall balance assessment: Needs assistance         Standing balance support: Bilateral upper extremity supported Standing balance-Leahy Scale: Poor Standing balance comment: Patient able to maintain balance while holding onto bed rails with L LE assisted to follow NWB precautions. Patient was able to stand up tall fully WBing through arms and R LE for about 20 seconds before fatiguing. Single Leg Stance - Right Leg: 0.33 (As described above during Standing balance since NWB L LE)                  Cognition Arousal/Alertness: Awake/alert Behavior During Therapy: WFL for tasks assessed/performed Overall Cognitive Status: Within Functional Limits for tasks assessed                      Exercises General Exercises - Lower Extremity Long Arc Quad: AROM;Left;15 reps;Seated Hip Flexion/Marching: AROM;Strengthening;Left;10 reps;15 reps;Seated (2 x 10 with manual resistance, 1 x 15 no resistance)    General Comments        Pertinent Vitals/Pain Pain Assessment: No/denies pain    Home Living                      Prior Function  PT Goals (current goals can now be found in the care plan section) Acute Rehab PT Goals Patient Stated Goal: Go home and return to independent lifestyle. PT Goal Formulation: With patient Time For Goal Achievement: 10/18/14 Potential to Achieve Goals: Good Progress towards PT goals: Progressing toward goals    Frequency  Min 3X/week    PT Plan Current plan remains appropriate    Co-evaluation             End of Session Equipment Utilized During Treatment: Gait belt Activity Tolerance: Patient limited by fatigue;Treatment limited secondary to medical complications (Comment) Patient left: in chair;with call bell/phone within reach;with chair alarm set      Time: 1414-1440 PT Time Calculation (min) (ACUTE ONLY): 26 min  Charges:  $Therapeutic Exercise: 8-22 mins $Therapeutic Activity: 8-22 mins                    G CodesRoanna Epley, SPT [phone]  10/08/2014, 3:49 PM  I have read, reviewed and agree with student's note.   [facility] [phone] (pager)

## 2014-10-08 NOTE — Consult Note (Addendum)
WOC wound consult note Reason for Consult: Consult requested for left foot wound.  Ortho consult was performed on 8/10 and sutures were placed to skin flap over left foot and leg at that time. Wound type: Full thickness skin flap with sutures in place to outer wound edges Measurement: 16X10cm area  Wound bed: dark purple-black skin to flap, fluctuant underneath skin level.   Drainage (amount, consistency, odor) Large amt old dried blood and dark red drainage from skin flaps which are beginning to peel and are not adhered.  Toes are dark purple and bruised Dressing procedure/placement/frequency: Assessed wound with primary team at the bedside.  Recommend re-consult to ortho service for further plan of care since flap of skin may not be viable and might require debridement. This is beyond [facility] scope of practice. Vaseline gauze and abd pad with kerlex applied and boot re-applied. Please re-consult if further assistance is needed.  Thank-you,  Cammie Mcgee MSN, RN, CWOCN, [place], CNS [phone]

## 2014-10-08 NOTE — Progress Notes (Signed)
Patient Profile: 80 y.o. female with a history of Takotsubo CM, hypothyroid, anemia and shingles. EF 60% in 2012. Admitted 10/02/14 with foot injury and pedal hematoma and foot ankle fractures.  An ECG was performed and was significantly abnormal. Her troponin was elevated. The ECG met STEMI criteria and she was transferred emergently to [facility] and taken directly to the cath lab. Normal Cors but severe LV dysfunction.  Marked wall motion abnormality of the mid to apical segments with hyperdynamic base consistent with Takotsubo syndrome.  Subjective: No chest pain, no SOB.  Has foot pain  Objective: Vital signs in last 24 hours: Temp:  [98.3 F (36.8 C)-101.8 F (38.8 C)] 101.8 F (38.8 C) (08/15 0827) Pulse Rate:  [79-113] 105 (08/15 0827) Resp:  [16-19] 18 (08/15 0827) BP: (87-120)/(40-97) 114/47 mmHg (08/15 0827) SpO2:  [91 %-98 %] 91 % (08/15 0633) Weight:  [140 lb 14.4 oz (63.912 kg)-141 lb 8 oz (64.184 kg)] 140 lb 14.4 oz (63.912 kg) (08/15 1093) Weight change: 3 lb 0.8 oz (1.384 kg) Last BM Date: 10/05/14 Intake/Output from previous day: -1497 08/14 0701 - 08/15 0700 In: 703 [P.O.:700; I.V.:3] Out: 2200 [Urine:2200] Intake/Output this shift:    PE: General:Pleasant affect, NAD Skin:Warm and dry, brisk capillary refill HEENT:normocephalic, sclera clear, mucus membranes moist Heart:S1S2 RRR without murmur, gallup, rub or click Lungs:clear to diminshed  without rales, rhonchi, or wheezes ATF:TDDU, non tender, + BS, do not palpate liver spleen or masses Ext:no lower ext edema,  2+ radial pulses Neuro:alert and oriented X 3, MAE, follows commands, + facial symmetry Tele:  SR to slight ST   Lab Results:  Recent Labs  10/07/14 1605  WBC 11.8*  HGB 9.3*  HCT 27.6*  PLT 286   BMET  Recent Labs  10/07/14 0227 10/08/14 0358  NA 126* 126*  K 3.6 3.5  CL 96* 95*  CO2 23 25  GLUCOSE 114* 92  BUN 7 8  CREATININE 0.63 0.59  CALCIUM 7.8* 7.7*   No results for  input(s): TROPONINI in the last 72 hours.  Invalid input(s): CK, MB  Lab Results  Component Value Date   CHOL 82 10/04/2014   HDL 32* 10/04/2014   LDLCALC 37 10/04/2014   TRIG 67 10/04/2014   CHOLHDL 2.6 10/04/2014   Lab Results  Component Value Date   HGBA1C 5.6 09/10/2010     Lab Results  Component Value Date   TSH 4.635* 10/07/2014    Hepatic Function Panel  Recent Labs  10/06/14 0216  PROT 4.7*  ALBUMIN 2.0*  AST 26  ALT 15  ALKPHOS 62  BILITOT 0.2*  BILIDIR 0.2  IBILI 0.0*   No results for input(s): CHOL in the last 72 hours. No results for input(s): PROTIME in the last 72 hours.     Studies/Results: Dg Chest Port 1 View  10/07/2014   CLINICAL DATA:  Fever  EXAM: PORTABLE CHEST - 1 VIEW  COMPARISON:  09/11/2010  FINDINGS: Cardiomediastinal silhouette is stable. No pulmonary edema. There is left basilar atelectasis or infiltrate. Degenerative changes bilateral shoulders. Atherosclerotic calcifications of thoracic aorta.  IMPRESSION: No pulmonary edema.  Left basilar atelectasis or infiltrate.   Electronically Signed   By: Lahoma Crocker M.D.   On: 10/07/2014 15:19   ECHO  10/03/14 Study Conclusions - Left ventricle: The cavity size was normal. Wall thickness was increased in a pattern of moderate LVH. There was focal basal hypertrophy. Systolic function was moderately to severely reduced. The  estimated ejection fraction was in the range of 30% to 35%. Akinesis of the mid-apicalanteroseptal, inferior, and apical myocardium; consistent with infarction. Features are consistent with a pseudonormal left ventricular filling pattern, with concomitant abnormal relaxation and increased filling pressure (grade 2 diastolic dysfunction).  Medications: I have reviewed the patient's current medications. Scheduled Meds: . aspirin  81 mg Oral Daily  . carvedilol  3.125 mg Oral BID WC  . lisinopril  2.5 mg Oral Daily  . rosuvastatin  40 mg Oral QHS  .  sodium chloride  3 mL Intravenous Q12H   Continuous Infusions: . norepinephrine (LEVOPHED) Adult infusion Stopped (10/07/14 0200)   PRN Meds:.sodium chloride, acetaminophen, ALPRAZolam, hydrocortisone cream, hydrOXYzine, nitroGLYCERIN, ondansetron (ZOFRAN) IV, oxyCODONE-acetaminophen, sodium chloride, zolpidem  Assessment/Plan: Principal Problem:   Stress-induced cardiomyopathy Active Problems:   Hypothyroidism   HTN (hypertension)   Takotsubo cardiomyopathy   Acute MI   Foot fracture   Demand ischemia   Fever  1. Stress-Induced Cardiomyopathy: Day # 6 s/p cath revealing normal coronaries. 2D echo consistent with Takatsubo. Major issue has been decreased BP. Now off IVF and levophed. Transferred from [place] yesterday and now on coreg and lisinopril.  -1497 today.  BP 142 systolic today   2. Left Ankle Fracture: L ankle sutured by ortho. In boot and stable. Still with pain despite vicodin .PT consult placed.  3. UTI: Cipro stopped yesterday after 4 days but urine with + nitrites yesterday   4. Hyponatremia- continues at 126- urine Na 43 / urine Cr 134  5.  Fever- 101.6 this AM WBC elevated yesterday and today, blood cultures in process.  6.  Urinary retention   7/ anemia with H/H 9.3/276 was on the 10th of Aug.  11.1/33  Main issues now are recent ankle fracture with poor mobility and ongoing pain. Will ask [facility] to assume care. PT consult ordered. Recommended short-term SNF.    LOS: 6 days   Time spent with pt. :15 minutes. [facility]  Nurse Practitioner Certified Pager [phone] or after 5pm and on weekends call [phone] 10/08/2014, 8:56 AM   Patient examined chart reviewed.  Agree with above On appropriate meds for stressed induced DCM And EF should recover over the course of the next 6 weeks.  Primary issue is pain control and ankle Fracture Appropriate to transfer to medical service  Jenkins Rouge
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
## 2014-10-08 NOTE — Progress Notes (Signed)
Foot degloving left rechecked Flap non viable Could be source of elevated wbc Will get plastics consult - tentatively plan debridement Tuesday which is fluid based on plastics recs for treatment Wound vac may be tough in this case with proximity of dorsal neurovascular structures beneath flap Pt off lovenox

## 2014-10-09 ENCOUNTER — Ambulatory Visit: Payer: Medicare Other | Admitting: Cardiovascular Disease

## 2014-10-09 ENCOUNTER — Inpatient Hospital Stay (HOSPITAL_COMMUNITY): Payer: Medicare Other | Admitting: Certified Registered"

## 2014-10-09 ENCOUNTER — Encounter (HOSPITAL_COMMUNITY)
Admission: EM | Disposition: A | Discharge status: Skilled Nursing Facility | Payer: Medicare Other | Source: Home / Self Care | Attending: Cardiology

## 2014-10-09 ENCOUNTER — Encounter (HOSPITAL_COMMUNITY): Payer: Self-pay | Admitting: Certified Registered"

## 2014-10-09 DIAGNOSIS — S91312A Laceration without foreign body, left foot, initial encounter: Principal | ICD-10-CM

## 2014-10-09 DIAGNOSIS — I213 ST elevation (STEMI) myocardial infarction of unspecified site: Secondary | ICD-10-CM | POA: Diagnosis present

## 2014-10-09 DIAGNOSIS — E039 Hypothyroidism, unspecified: Secondary | ICD-10-CM

## 2014-10-09 DIAGNOSIS — S91319A Laceration without foreign body, unspecified foot, initial encounter: Secondary | ICD-10-CM | POA: Insufficient documentation

## 2014-10-09 HISTORY — PX: APPLICATION OF WOUND VAC: SHX5189

## 2014-10-09 HISTORY — PX: I&D EXTREMITY: SHX5045

## 2014-10-09 LAB — BASIC METABOLIC PANEL
Anion gap: 6 (ref 5–15)
BUN: 10 mg/dL (ref 6–20)
CALCIUM: 7.7 mg/dL — AB (ref 8.9–10.3)
CO2: 26 mmol/L (ref 22–32)
CREATININE: 0.67 mg/dL (ref 0.44–1.00)
Chloride: 94 mmol/L — ABNORMAL LOW (ref 101–111)
GFR calc Af Amer: >60 mL/min (ref >60)
GLUCOSE: 95 mg/dL (ref 65–99)
Potassium: 3.6 mmol/L (ref 3.5–5.1)
Sodium: 126 mmol/L — ABNORMAL LOW (ref 135–145)

## 2014-10-09 LAB — URINE CULTURE: Culture: NO GROWTH

## 2014-10-09 LAB — CORTISOL: Cortisol, Plasma: 6.1 ug/dL

## 2014-10-09 SURGERY — IRRIGATION AND DEBRIDEMENT EXTREMITY
Anesthesia: General | Laterality: Left

## 2014-10-09 MED ORDER — SODIUM CHLORIDE 0.9 % IV SOLN
10.0000 mg | INTRAVENOUS | Status: DC | PRN
  Administered 2014-10-09: 10 ug/min via INTRAVENOUS

## 2014-10-09 MED ORDER — HYDROMORPHONE HCL 1 MG/ML IJ SOLN
0.2500 mg | INTRAMUSCULAR | Status: DC | PRN
  Administered 2014-10-09: 0.5 mg via INTRAVENOUS

## 2014-10-09 MED ORDER — FENTANYL CITRATE (PF) 250 MCG/5ML IJ SOLN
INTRAMUSCULAR | Status: AC
  Filled 2014-10-09: qty 5

## 2014-10-09 MED ORDER — ONDANSETRON HCL 4 MG/2ML IJ SOLN: 4.0000 mg | Freq: Four times a day (QID) | INTRAMUSCULAR | Status: DC | PRN

## 2014-10-09 MED ORDER — HYDROCODONE-ACETAMINOPHEN 7.5-325 MG PO TABS: 1.0000 | ORAL_TABLET | Freq: Once | ORAL | Status: DC | PRN

## 2014-10-09 MED ORDER — ACETAMINOPHEN 650 MG RE SUPP
650.0000 mg | Freq: Four times a day (QID) | RECTAL | Status: DC | PRN
  Filled 2014-10-09: qty 1

## 2014-10-09 MED ORDER — CEFAZOLIN SODIUM-DEXTROSE 2-3 GM-% IV SOLR
INTRAVENOUS | Status: AC
  Filled 2014-10-09: qty 50

## 2014-10-09 MED ORDER — LACTATED RINGERS IV SOLN
INTRAVENOUS | Status: DC | PRN
  Administered 2014-10-09: 17:00:00 via INTRAVENOUS

## 2014-10-09 MED ORDER — METOCLOPRAMIDE HCL 5 MG/ML IJ SOLN: 5.0000 mg | Freq: Three times a day (TID) | INTRAMUSCULAR | Status: DC | PRN

## 2014-10-09 MED ORDER — COSYNTROPIN 0.25 MG IJ SOLR
0.2500 mg | Freq: Once | INTRAMUSCULAR | Status: AC
  Administered 2014-10-10: 0.25 mg via INTRAVENOUS
  Filled 2014-10-09: qty 0.25

## 2014-10-09 MED ORDER — HYDROMORPHONE HCL 1 MG/ML IJ SOLN
INTRAMUSCULAR | Status: AC
  Filled 2014-10-09: qty 2

## 2014-10-09 MED ORDER — PROPOFOL 10 MG/ML IV BOLUS
INTRAVENOUS | Status: DC | PRN
  Administered 2014-10-09 (×2): 20 mg via INTRAVENOUS

## 2014-10-09 MED ORDER — LIDOCAINE HCL (CARDIAC) 20 MG/ML IV SOLN
INTRAVENOUS | Status: DC | PRN
  Administered 2014-10-09: 60 mg via INTRAVENOUS

## 2014-10-09 MED ORDER — ONDANSETRON HCL 4 MG PO TABS: 4.0000 mg | ORAL_TABLET | Freq: Four times a day (QID) | ORAL | Status: DC | PRN

## 2014-10-09 MED ORDER — 0.9 % SODIUM CHLORIDE (POUR BTL) OPTIME
TOPICAL | Status: DC | PRN
  Administered 2014-10-09: 1000 mL

## 2014-10-09 MED ORDER — FENTANYL CITRATE (PF) 100 MCG/2ML IJ SOLN
INTRAMUSCULAR | Status: DC | PRN
  Administered 2014-10-09 (×5): 50 ug via INTRAVENOUS

## 2014-10-09 MED ORDER — METOCLOPRAMIDE HCL 5 MG PO TABS: 5.0000 mg | ORAL_TABLET | Freq: Three times a day (TID) | ORAL | Status: DC | PRN

## 2014-10-09 MED ORDER — ACETAMINOPHEN 325 MG PO TABS
650.0000 mg | ORAL_TABLET | Freq: Four times a day (QID) | ORAL | Status: DC | PRN
  Filled 2014-10-09: qty 2

## 2014-10-09 MED ORDER — CEFAZOLIN SODIUM-DEXTROSE 2-3 GM-% IV SOLR
INTRAVENOUS | Status: DC | PRN
  Administered 2014-10-09: 2 g via INTRAVENOUS

## 2014-10-09 MED ORDER — PROMETHAZINE HCL 25 MG/ML IJ SOLN: 6.2500 mg | INTRAMUSCULAR | Status: DC | PRN

## 2014-10-09 MED ORDER — ETOMIDATE 2 MG/ML IV SOLN
INTRAVENOUS | Status: DC | PRN
  Administered 2014-10-09: 8 mg via INTRAVENOUS

## 2014-10-09 MED ORDER — SODIUM CHLORIDE 0.9 % IV SOLN
INTRAVENOUS | Status: DC
  Administered 2014-10-09: 35 mL/h via INTRAVENOUS

## 2014-10-09 MED ORDER — LACTATED RINGERS IV SOLN: INTRAVENOUS | Status: DC

## 2014-10-09 MED ORDER — LACTATED RINGERS IV SOLN
INTRAVENOUS | Status: DC
  Administered 2014-10-09: 35 mL/h via INTRAVENOUS

## 2014-10-09 MED ORDER — ETOMIDATE 2 MG/ML IV SOLN: INTRAVENOUS | Status: DC | PRN

## 2014-10-09 MED ORDER — ONDANSETRON HCL 4 MG/2ML IJ SOLN
INTRAMUSCULAR | Status: DC | PRN
  Administered 2014-10-09: 4 mg via INTRAVENOUS

## 2014-10-09 SURGICAL SUPPLY — 55 items
BANDAGE ELASTIC 4 VELCRO ST LF (GAUZE/BANDAGES/DRESSINGS) IMPLANT
BNDG COHESIVE 4X5 TAN STRL (GAUZE/BANDAGES/DRESSINGS) IMPLANT
BNDG GAUZE ELAST 4 BULKY (GAUZE/BANDAGES/DRESSINGS) IMPLANT
CANISTER WOUND CARE 500ML ATS (WOUND CARE) ×3 IMPLANT
CASSETTE VERAFLO VERALINK (MISCELLANEOUS) ×3 IMPLANT
COVER SURGICAL LIGHT HANDLE (MISCELLANEOUS) ×3 IMPLANT
CUFF TOURNIQUET SINGLE 18IN (TOURNIQUET CUFF) IMPLANT
CUFF TOURNIQUET SINGLE 24IN (TOURNIQUET CUFF) IMPLANT
CUFF TOURNIQUET SINGLE 34IN LL (TOURNIQUET CUFF) IMPLANT
CUFF TOURNIQUET SINGLE 44IN (TOURNIQUET CUFF) IMPLANT
DRAPE INCISE IOBAN 66X45 STRL (DRAPES) ×3 IMPLANT
DRAPE U-SHAPE 47X51 STRL (DRAPES) ×3 IMPLANT
DRSG PAD ABDOMINAL 8X10 ST (GAUZE/BANDAGES/DRESSINGS) IMPLANT
DRSG VAC ATS MED SENSATRAC (GAUZE/BANDAGES/DRESSINGS) IMPLANT
DRSG VERAFLO VAC MED (GAUZE/BANDAGES/DRESSINGS) ×3 IMPLANT
DURAPREP 26ML APPLICATOR (WOUND CARE) ×3 IMPLANT
ELECT REM PT RETURN 9FT ADLT (ELECTROSURGICAL)
ELECTRODE REM PT RTRN 9FT ADLT (ELECTROSURGICAL) IMPLANT
FACESHIELD WRAPAROUND (MASK) ×3 IMPLANT
GAUZE SPONGE 4X4 12PLY STRL (GAUZE/BANDAGES/DRESSINGS) IMPLANT
GAUZE XEROFORM 5X9 LF (GAUZE/BANDAGES/DRESSINGS) IMPLANT
GLOVE BIOGEL PI IND STRL 8 (GLOVE) ×1 IMPLANT
GLOVE BIOGEL PI INDICATOR 8 (GLOVE) ×2
GLOVE SURG ORTHO 8.0 STRL STRW (GLOVE) ×3 IMPLANT
GOWN STRL REUS W/ TWL LRG LVL3 (GOWN DISPOSABLE) ×2 IMPLANT
GOWN STRL REUS W/ TWL XL LVL3 (GOWN DISPOSABLE) ×1 IMPLANT
GOWN STRL REUS W/TWL LRG LVL3 (GOWN DISPOSABLE) ×4
GOWN STRL REUS W/TWL XL LVL3 (GOWN DISPOSABLE) ×2
HANDPIECE INTERPULSE COAX TIP (DISPOSABLE)
KIT BASIN OR (CUSTOM PROCEDURE TRAY) ×3 IMPLANT
KIT ROOM TURNOVER OR (KITS) ×3 IMPLANT
MANIFOLD NEPTUNE II (INSTRUMENTS) ×3 IMPLANT
NS IRRIG 1000ML POUR BTL (IV SOLUTION) ×9 IMPLANT
PACK ORTHO EXTREMITY (CUSTOM PROCEDURE TRAY) ×3 IMPLANT
PAD ARMBOARD 7.5X6 YLW CONV (MISCELLANEOUS) ×6 IMPLANT
PAD CAST 4YDX4 CTTN HI CHSV (CAST SUPPLIES) IMPLANT
PADDING CAST COTTON 4X4 STRL (CAST SUPPLIES)
SET HNDPC FAN SPRY TIP SCT (DISPOSABLE) IMPLANT
SPONGE LAP 18X18 X RAY DECT (DISPOSABLE) ×6 IMPLANT
SPONGE LAP 4X18 X RAY DECT (DISPOSABLE) ×3 IMPLANT
STOCKINETTE IMPERVIOUS 9X36 MD (GAUZE/BANDAGES/DRESSINGS) ×3 IMPLANT
SUT ETHILON 2 0 FS 18 (SUTURE) IMPLANT
SUT ETHILON 3 0 PS 1 (SUTURE) IMPLANT
SUT ETHILON 4 0 PS 2 18 (SUTURE) IMPLANT
SUT PROLENE 3 0 PS 2 (SUTURE) IMPLANT
SUT VIC AB 3-0 SH 27 (SUTURE)
SUT VIC AB 3-0 SH 27X BRD (SUTURE) IMPLANT
TOWEL OR 17X24 6PK STRL BLUE (TOWEL DISPOSABLE) ×3 IMPLANT
TOWEL OR 17X26 10 PK STRL BLUE (TOWEL DISPOSABLE) ×3 IMPLANT
TUBE ANAEROBIC SPECIMEN COL (MISCELLANEOUS) IMPLANT
TUBE CONNECTING 12'X1/4 (SUCTIONS) ×1
TUBE CONNECTING 12X1/4 (SUCTIONS) ×2 IMPLANT
UNDERPAD 30X30 INCONTINENT (UNDERPADS AND DIAPERS) ×3 IMPLANT
WATER STERILE IRR 1000ML POUR (IV SOLUTION) IMPLANT
YANKAUER SUCT BULB TIP NO VENT (SUCTIONS) ×3 IMPLANT
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
## 2014-10-09 NOTE — Anesthesia Postprocedure Evaluation (Signed)
Anesthesia Post Note  Patient: Charlene Vincent  Procedure(s) Performed: Procedure(s) (LRB): DEBRIDEMENT LEFT FOOT (Left) APPLICATION OF WOUND VAC (Left)  Anesthesia type: general  Patient location: PACU  Post pain: Pain level controlled  Post assessment: Patient's Cardiovascular Status Stable  Last Vitals:  Filed Vitals:   10/09/14 1915  BP: 130/65  Pulse: 90  Temp: 36.7 C  Resp: 15    Post vital signs: Reviewed and stable  Level of consciousness: sedated  Complications: No apparent anesthesia complications

## 2014-10-09 NOTE — Clinical Documentation Improvement (Signed)
Please clarify conflicting documentation related to STEMI and  below supporting documentation, if appropriate.   Possible Clinical Conditions? _______STEMI ruled out  _______STEMI ruled in _______Other Condition__________________ _______Cannot Clinically Determine   Supporting Information: Per 10/09/14 MD progress note = STEMI (ST elevation myocardial infarction)   Per 10/05/14 MD progress note = Acute MI.   Day # STEMI/Takatsubo. No Cp/SOB. Major issue is BP. Getting IVF. Lungs clear. Still on Levophed. BB and ACE-I held secondary to hypotension.    Thank You, Shelda Pal ,RN Clinical Documentation Specialist:  [phone]  [facility]- Health Information Management

## 2014-10-09 NOTE — Progress Notes (Signed)
Subjective:  She denies dyspnea or chest pain  Objective:  Vital Signs in the last 24 hours: Temp:  [98.6 F (37 C)-99.9 F (37.7 C)] 99.1 F (37.3 C) (08/16 0859) Pulse Rate:  [77-100] 86 (08/16 0859) Resp:  [17-18] 18 (08/16 0859) BP: (98-132)/(51-64) 105/58 mmHg (08/16 0859) SpO2:  [95 %-98 %] 95 % (08/16 0859) Weight:  [141 lb (63.957 kg)] 141 lb (63.957 kg) (08/16 0505)  Intake/Output from previous day:  Intake/Output Summary (Last 24 hours) at 10/09/14 0904 Last data filed at 10/09/14 0752  Gross per 24 hour  Intake    480 ml  Output    600 ml  Net   -120 ml    Physical Exam: General appearance: alert, cooperative, no distress and pale Neck: no JVD Lungs: few crackles Lt base Heart: regular rate and rhythm   Rate: 86  Rhythm: normal sinus rhythm  Lab Results:  Recent Labs  10/07/14 1605  WBC 11.8*  HGB 9.3*  PLT 286    Recent Labs  10/08/14 0358 10/09/14 0414  NA 126* 126*  K 3.5 3.6  CL 95* 94*  CO2 25 26  GLUCOSE 92 95  BUN 8 10  CREATININE 0.59 0.67   No results for input(s): TROPONINI in the last 72 hours.  Invalid input(s): CK, MB No results for input(s): INR in the last 72 hours.  Scheduled Meds: . aspirin  81 mg Oral Daily  . carvedilol  3.125 mg Oral BID WC  . [START ON 10/10/2014] cosyntropin  0.25 mg Intravenous Once  . lisinopril  2.5 mg Oral Daily  . rosuvastatin  40 mg Oral QHS  . sodium chloride  3 mL Intravenous Q12H   Continuous Infusions:  PRN Meds:.sodium chloride, acetaminophen, ALPRAZolam, hydrocortisone cream, hydrOXYzine, nitroGLYCERIN, ondansetron (ZOFRAN) IV, oxyCODONE-acetaminophen, sodium chloride, zolpidem   Imaging: Dg Chest Port 1 View  10/07/2014   CLINICAL DATA:  Fever  EXAM: PORTABLE CHEST - 1 VIEW  COMPARISON:  09/11/2010  FINDINGS: Cardiomediastinal silhouette is stable. No pulmonary edema. There is left basilar atelectasis or infiltrate. Degenerative changes bilateral shoulders. Atherosclerotic  calcifications of thoracic aorta.  IMPRESSION: No pulmonary edema.  Left basilar atelectasis or infiltrate.   Electronically Signed   By: Natasha Mead M.D.   On: 10/07/2014 15:19    Cardiac Studies: Echo 10/03/14 Study Conclusions  - Left ventricle: The cavity size was normal. Wall thickness was increased in a pattern of moderate LVH. There was focal basal hypertrophy. Systolic function was moderately to severely reduced. The estimated ejection fraction was in the range of 30% to 35%. Akinesis of the mid-apicalanteroseptal, inferior, and apical myocardium; consistent with infarction. Features are consistent with a pseudonormal left ventricular filling pattern, with concomitant abnormal relaxation and increased filling pressure (grade 2 diastolic dysfunction).   Assessment/Plan:  80 y.o. female with a history of Takotsubo CM July 2012.  She was at [facility] 10/02/14 and fell out the car husband was parking. She suffered a degloving foot and ankle fracture. In the ED her EKG showed diffuse ST elevation and she was transferred to [facility] as a STEMI. She was taken to the cath lab and had minor CAD with significant LVD. Her Troponin peaked at 10.26. EF is 30-35%.   Principal Problem:   STEMI (ST elevation myocardial infarction) Active Problems:   Foot fracture   Stress-induced cardiomyopathy   Takotsubo cardiomyopathy   Fever   Hypothyroidism   Anemia   HTN (hypertension)   PLAN:  She is  on low dose beta blocker,aspirin 81 mg, statin, and ACE. Her B/P is low- no room to uptitrate meds.  She is for wound debridement today.   Corine Shelter PA-C 10/09/2014, 9:04 AM [phone]  No complaints cardiac status stable.  Left ankle dressed in gauze.  For debridement today Cardiac status stable.  Charlton Haws

## 2014-10-09 NOTE — Anesthesia Procedure Notes (Signed)
Procedure Name: LMA Insertion Date/Time: 10/09/2014 5:40 PM Performed by: Wray Kearns A Pre-anesthesia Checklist: Patient identified, Timeout performed, Emergency Drugs available, Suction available and Patient being monitored Patient Re-evaluated:Patient Re-evaluated prior to inductionOxygen Delivery Method: Circle system utilized Preoxygenation: Pre-oxygenation with 100% oxygen Intubation Type: IV induction Ventilation: Mask ventilation without difficulty LMA: LMA inserted LMA Size: 4.0 Tube type: Oral Number of attempts: 1 Placement Confirmation: breath sounds checked- equal and bilateral and positive ETCO2 Tube secured with: Tape Dental Injury: Teeth and Oropharynx as per pre-operative assessment

## 2014-10-09 NOTE — Brief Op Note (Signed)
10/02/2014 - 10/09/2014  6:32 PM  PATIENT:  Charlene Vincent  80 y.o. female  PRE-OPERATIVE DIAGNOSIS:  Left Infected Foot  POST-OPERATIVE DIAGNOSIS:  Left foot infection with dorsal degloving injury  PROCEDURE:  Procedure(s): DEBRIDEMENT LEFT FOOT APPLICATION OF WOUND VAC  SURGEON:  Surgeon(s): Cammy Copa, MD  ASSISTANT: none   ANESTHESIA:   general  EBL: 30 ml    Total I/O In: 0  Out: 1000 [Urine:1000]  BLOOD ADMINISTERED: none  DRAINS: Vera flow wound VAC   LOCAL MEDICATIONS USED:  none  SPECIMEN:  No Specimen  COUNTS:  YES  TOURNIQUET:  * No tourniquets in log *  DICTATION: .Other Dictation: Dictation Number [id number]   PLAN OF CARE: Admit to inpatient   PATIENT DISPOSITION:  PACU - hemodynamically stable

## 2014-10-09 NOTE — Anesthesia Preprocedure Evaluation (Addendum)
Anesthesia Evaluation  Patient identified by MRN, date of birth, ID band Patient awake    Reviewed: Allergy & Precautions, NPO status , Patient's Chart, lab work & pertinent test results, reviewed documented beta blocker date and time   Airway Mallampati: II  TM Distance: >3 FB Neck ROM: Full    Dental   Pulmonary former smoker,  breath sounds clear to auscultation        Cardiovascular hypertension, Pt. on medications and Pt. on home beta blockers + Past MI and +CHF Rhythm:Regular Rate:Normal  EF 30-35%. Takotsubo CM   Neuro/Psych negative neurological ROS     GI/Hepatic negative GI ROS, Neg liver ROS,   Endo/Other  Hypothyroidism   Renal/GU negative Renal ROS     Musculoskeletal  (+) Arthritis -,   Abdominal   Peds  Hematology  (+) anemia ,   Anesthesia Other Findings   Reproductive/Obstetrics                            Anesthesia Physical Anesthesia Plan  ASA: III  Anesthesia Plan: General   Post-op Pain Management:    Induction: Intravenous  Airway Management Planned: LMA  Additional Equipment:   Intra-op Plan:   Post-operative Plan: Extubation in OR  Informed Consent: I have reviewed the patients History and Physical, chart, labs and discussed the procedure including the risks, benefits and alternatives for the proposed anesthesia with the patient or authorized representative who has indicated his/her understanding and acceptance.   Dental advisory given  Plan Discussed with: CRNA  Anesthesia Plan Comments:        Anesthesia Quick Evaluation

## 2014-10-09 NOTE — Consult Note (Addendum)
WOC follow-up: Ortho service following; refer to progress notes and plans for surgery.  Please re-consult if further assistance is needed.  Thank-you,  Cammie Mcgee MSN, RN, CWOCN, [place], CNS [phone]

## 2014-10-09 NOTE — Progress Notes (Signed)
Debridement performed Plan for plastic surgery to apply coverage to foot on Friday Will need this wound vac for continuous debridement until then

## 2014-10-09 NOTE — Transfer of Care (Signed)
Immediate Anesthesia Transfer of Care Note  Patient: Charlene Vincent  Procedure(s) Performed: Procedure(s): DEBRIDEMENT LEFT FOOT (Left) APPLICATION OF WOUND VAC (Left)  Patient Location: PACU  Anesthesia Type:General  Level of Consciousness: awake, oriented, sedated, patient cooperative and responds to stimulation  Airway & Oxygen Therapy: Patient Spontanous Breathing and Patient connected to nasal cannula oxygen  Post-op Assessment: Report given to RN, Post -op Vital signs reviewed and stable, Patient moving all extremities and Patient moving all extremities X 4  Post vital signs: Reviewed and stable  Last Vitals:  Filed Vitals:   10/09/14 1502  BP: 105/55  Pulse: 78  Temp: 37 C  Resp: 18    Complications: No apparent anesthesia complications

## 2014-10-09 NOTE — Progress Notes (Signed)
Plan operative debridement of foot with wound VAC placement today. Discussed the case with plastic surgery who will follow when available. She may need change over to a cell/Integra next week

## 2014-10-10 ENCOUNTER — Encounter (HOSPITAL_COMMUNITY): Payer: Self-pay | Admitting: Physician Assistant

## 2014-10-10 DIAGNOSIS — R5081 Fever presenting with conditions classified elsewhere: Secondary | ICD-10-CM

## 2014-10-10 LAB — ACTH STIMULATION, 3 TIME POINTS
Cortisol, 30 Min: 15.2 ug/dL
Cortisol, 60 Min: 18 ug/dL
Cortisol, Base: 13.5 ug/dL

## 2014-10-10 MED ORDER — ENOXAPARIN SODIUM 30 MG/0.3ML ~~LOC~~ SOLN
30.0000 mg | SUBCUTANEOUS | Status: DC
  Administered 2014-10-10 – 2014-10-12 (×3): 30 mg via SUBCUTANEOUS
  Filled 2014-10-10 (×4): qty 0.3

## 2014-10-10 MED ORDER — SACCHAROMYCES BOULARDII 250 MG PO CAPS
250.0000 mg | ORAL_CAPSULE | Freq: Two times a day (BID) | ORAL | Status: DC
  Administered 2014-10-10 – 2014-10-13 (×7): 250 mg via ORAL
  Filled 2014-10-10 (×10): qty 1

## 2014-10-10 NOTE — Op Note (Signed)
NAMEMARILUZ, [name]  MEDICAL RECORD NO.:  [id number]  LOCATION:  3E21C                        FACILITY:  [facility]  PHYSICIAN:  Burnard Bunting, M.D.    DATE OF BIRTH:  08-Aug-1934  DATE OF PROCEDURE: DATE OF DISCHARGE:                              OPERATIVE REPORT   PREOPERATIVE DIAGNOSIS:  Left dorsal foot degloving injury.  POSTOPERATIVE DIAGNOSIS:  Left dorsal foot degloving injury.  PROCEDURE:  Debridement of dorsal left foot, application of VeraFlo wound VAC.  SURGEON:  Burnard Bunting, M.D.  ASSISTANT:  None.  ANESTHESIA:  General.  INDICATIONS:  Charlene Vincent is a patient with left foot degloving injury.  She presents now for operative management after the dorsal flap died.  She presents now for further management of the dorsal degloving foot wound.  PROCEDURE IN DETAIL:  The patient was brought to the operating room where general anesthetic was induced.  Preop antibiotics were administered.  Time-out was called.  Left foot prepped with Hibiclens saline, draped in sterile manner.  Debridement, full-thickness skin was debrided off the dorsal aspect of the foot.  Total wound measured approximately 15 x 20 cm.  Involves essentially the entire dorsal foot up to the just distal to the MTP joints of the toes.  Following debridement both manually and with a curette, the foot was irrigated with 3 L of irrigating solution.  A VeraFlo wound VAC was then placed with good seal obtained.  The patient tolerated the procedure well without immediate complications, transferred to the recovery room in stable condition.     Burnard Bunting, M.D.     GSD/MEDQ  D:  10/09/2014  T:  10/10/2014  Job:  [id number]

## 2014-10-10 NOTE — Consult Note (Addendum)
WOC wound consult note Ortho service and plastics team following for post-op assessment and plan of care to left foot wound.  Veraflo Vac in place and functioning; 10 instill/10 soak/ vac time 2 hours, cont suction at .  NS infusing and Vac dressing is in place with good seal.  VAC rep at bedside this AM to perform staff education. They are available to assess for troubleshooting needs via phone.  Plastics team plans to perform dressing change in the OR on Friday according to the EMR. Please re-consult if further assistance is needed.  Thank-you,  Cammie Mcgee MSN, RN, CWOCN, [place], CNS [phone]

## 2014-10-10 NOTE — Progress Notes (Signed)
Physical Therapy Treatment Patient Details Name: Charlene Vincent MRN: [id number] DOB: 1934-10-14 Today's Date: 10/10/2014    History of Present Illness 80 y.o. female with a history of Takotsubo CM. She went to [facility] ER with a foot injury from a mechanical fall. Her foot was treated and a laceration was repaired. She has a large pedal hematoma as well as foot/ankle fractures. Pt was on the monitor and there was concern for arrhythmia. An ECG was performed and was significantly abnormal. The ECG met STEMI criteria. Now has wound vac applied to L foot on 8/16, PT continuing withNWB on LLE and cannot use boot any more due to vac.    PT Comments    Pt was able to better assist with transition to chair but has an element of confusion attached to her reasoning with care plan.  Her awareness of the situation is less than complete, and has limited spatial planning skills for transition to chair.  However, did more careful job with PT assisting her transfer keeping LLE off the floor.  Boot is no longer being used due to vac on foot. This may be helpful for her as she is fairly weak.  Follow Up Recommendations  SNF     Equipment Recommendations  None recommended by PT (await SNF disposition)    Recommendations for Other Services       Precautions / Restrictions Precautions Precautions: Fall Restrictions LLE Weight Bearing: Non weight bearing    Mobility  Bed Mobility Overal bed mobility: Needs Assistance Bed Mobility: Supine to Sit     Supine to sit: Min guard;Min assist        Transfers Overall transfer level: Needs assistance Equipment used: 1 person hand held assist Transfers: Sit to/from [facility] Sit to Stand: Mod assist Stand pivot transfers: Mod assist       General transfer comment: without the heavy boot on LLE pt could maintain NWB on LLE  Ambulation/Gait             General Gait Details: Did not walk   Stairs            Wheelchair  Mobility    Modified Rankin (Stroke Patients Only)       Balance Overall balance assessment: Needs assistance       Postural control: Posterior lean Standing balance support: Bilateral upper extremity supported Standing balance-Leahy Scale: Poor                      Cognition Arousal/Alertness: Awake/alert Behavior During Therapy: WFL for tasks assessed/performed Overall Cognitive Status: Within Functional Limits for tasks assessed       Memory: Decreased recall of precautions              Exercises      General Comments General comments (skin integrity, edema, etc.): Pt more capable of assisting with tx today, very motivated but anxious about her direction of transfer to chair.  Wanted to get off bed to L but assured her that the R side was better due to vac line.      Pertinent Vitals/Pain Pain Assessment: No/denies pain    Home Living                      Prior Function            PT Goals (current goals can now be found in the care plan section) Acute Rehab PT Goals Patient Stated Goal: Go  home and return to independent lifestyle. Progress towards PT goals: Progressing toward goals    Frequency  Min 3X/week    PT Plan Current plan remains appropriate    Co-evaluation             End of Session Equipment Utilized During Treatment: Gait belt Activity Tolerance: Patient limited by fatigue;Other (comment) (NWB on LLE) Patient left: in chair;with call bell/phone within reach;with chair alarm set     Time: 8097-0449 PT Time Calculation (min) (ACUTE ONLY): 29 min  Charges:  $Therapeutic Activity: 23-37 mins                    G Codes:      Ramond Dial 11-Oct-2014, 4:02 PM   Mee Hives, PT MS Acute Rehab Dept. Number: [facility] [phone] and [place] [phone]

## 2014-10-10 NOTE — Consult Note (Signed)
Reason for Consult:open wound left foot, degloving injury Referring Physician: Dr. Marlou Sa Location: [facility] Inpatient Date 10/10/14  Charlene Vincent is an 80 y.o. female.   HPI: Transferred from [facility] and admitted 10/02/14 following fall from standing. Suffered degloving injury soft tissue left foot as well as malleolar fracture. Soft tissue flap approximated on admission by Dr. Marlou Sa and this went on to develop full thickness necrosis. She has undergone debridement of this and Plastic Surgery consulted for treatment of wound. Patient experienced STEMI post injury, angiography with no significant lesions, repeat ECHO with EF 30-35%.  Plan to be NWB over LLE for at least 3 weeks with regards to fracture.  Past Medical History  Diagnosis Date  . Takotsubo cardiomyopathy EF 60%- 12/2010    s/p cath July 2012. EF is 35 to 40%. 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 Hypothyroidism   . Anemia   . Costochondritis   . Arthritis   . Cellulitis   . Shingles     Past Surgical History  Procedure Laterality Date  . Inguinal hernia repair    . Cardiac catheterization  September 10, 2010    EF 35 to 40%. No significant CAD. ? takotsubo cardiomyopathy  . Cardiac catheterization N/A 10/02/2014    Procedure: Left Heart Cath and Coronary Angiography;  Surgeon: Peter M Martinique, MD;  Location: [facility];  Service: Cardiovascular;  Laterality: N/A;    Family History  Problem Relation Age of Onset  . Heart disease Father   . Heart disease Brother     Social History:  reports that she quit smoking about 4 years ago. Her smoking use included Cigarettes. She smoked 0.50 packs per day. She does not have any smokeless tobacco history on file. She reports that she does not drink alcohol or use illicit drugs.  Allergies:  Allergies  Allergen Reactions  . Aspirin     But taking low dose therapy without problems.  . Penicillins Rash    Medications: I have reviewed the patient's current  medications.  Results for orders placed or performed during the hospital encounter of 10/02/14 (from the past 48 hour(s))  Basic metabolic panel     Status: Abnormal   Collection Time: 10/09/14  4:14 AM  Result Value Ref Range   Sodium 126 (L) 135 - 145 mmol/L   Potassium 3.6 3.5 - 5.1 mmol/L   Chloride 94 (L) 101 - 111 mmol/L   CO2 26 22 - 32 mmol/L   Glucose, Bld 95 65 - 99 mg/dL   BUN 10 6 - 20 mg/dL   Creatinine, Ser 0.67 0.44 - 1.00 mg/dL   Calcium 7.7 (L) 8.9 - 10.3 mg/dL   GFR calc non Af Amer >60 >60 mL/min   GFR calc Af Amer >60 >60 mL/min    Comment: (NOTE) The eGFR has been calculated using the CKD EPI equation. This calculation has not been validated in all clinical situations. eGFR's persistently <60 mL/min signify possible Chronic Kidney Disease.    Anion gap 6 5 - 15  Cortisol     Status: None   Collection Time: 10/09/14  4:14 AM  Result Value Ref Range   Cortisol, Plasma 6.1 ug/dL    Comment: (NOTE) AM    6.7 - 22.6 ug/dL PM   <10.0       ug/dL     ROS Blood pressure 109/47, pulse 80, temperature 98.1 F (36.7 C), temperature source Oral, resp. rate 18, height 5' 7" (  1.702 m), weight 60.6 kg (133 lb 9.6 oz), SpO2 97 %. Physical Exam  Afebrile post op Alert, NAD Left foot with VAC in place, less edema toes, no cellulitis intraop exam on 10/09/14: full thickness skin and subcutaneous tissue loss over dorsal foot from base of toes to over dorsal ankle 15 cm x 10 cm x 0.5 cm No gross pus, no gross exposure bone, extensor tendons exposed with paratenon intact Toes perfused  Assessment/Plan:  Open wound left foot. Plan repeat exam/washout and placement Integra tentative 10/12/14. Will replace VAC at that time and would be stable for discharge from wound standpoint at that time with home VAC. Reviewed large wound and may require skin grafting in few weeks time if medically stable, she desires this or will need prolonged wound care.  Currently on bed rest. If ok  per primary team, needs PT/OT eval for transfer to WC with elevated leg rest, NWB over LLE. Disposition would be home with WC with elevated leg rest, bedside commode (patient reports use pot for night time bathroom needs pre injury), and home health for wound care and home VAC. Alternative SNF.   Dent Plantz, MD MBA Plastic & Reconstructive Surgery [phone]  
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
## 2014-10-11 ENCOUNTER — Other Ambulatory Visit: Payer: Self-pay | Admitting: Physician Assistant

## 2014-10-11 DIAGNOSIS — I429 Cardiomyopathy, unspecified: Secondary | ICD-10-CM

## 2014-10-11 DIAGNOSIS — I2119 ST elevation (STEMI) myocardial infarction involving other coronary artery of inferior wall: Secondary | ICD-10-CM

## 2014-10-11 LAB — BASIC METABOLIC PANEL
ANION GAP: 7 (ref 5–15)
BUN: <5 mg/dL — ABNORMAL LOW (ref 6–20)
CALCIUM: 8.1 mg/dL — AB (ref 8.9–10.3)
CO2: 28 mmol/L (ref 22–32)
CREATININE: 0.59 mg/dL (ref 0.44–1.00)
Chloride: 95 mmol/L — ABNORMAL LOW (ref 101–111)
Glucose, Bld: 109 mg/dL — ABNORMAL HIGH (ref 65–99)
Potassium: 3.6 mmol/L (ref 3.5–5.1)
SODIUM: 130 mmol/L — AB (ref 135–145)

## 2014-10-11 MED ORDER — SIMETHICONE 80 MG PO CHEW
160.0000 mg | CHEWABLE_TABLET | Freq: Four times a day (QID) | ORAL | Status: DC | PRN
  Administered 2014-10-11: 160 mg via ORAL
  Filled 2014-10-11: qty 2

## 2014-10-11 NOTE — Clinical Social Work Placement (Signed)
   CLINICAL SOCIAL WORK PLACEMENT  NOTE  Date:  10/11/2014  Patient Details  Name: AYVREE RICKER MRN: [id number] Date of Birth: Oct 05, 1934  Clinical Social Work is seeking post-discharge placement for this patient at the Skilled  Nursing Facility level of care (*CSW will initial, date and re-position this form in  chart as items are completed):  Yes   Patient/family provided with [facility] Clinical Social Work Department's list of facilities offering this level of care within the geographic area requested by the patient (or if unable, by the patient's family).  Yes   Patient/family informed of their freedom to choose among providers that offer the needed level of care, that participate in Medicare, Medicaid or managed care program needed by the patient, have an available bed and are willing to accept the patient.  Yes   Patient/family informed of [facility]'s ownership interest in [facility] and [facility], as well as of the fact that they are under no obligation to receive care at these facilities.  PASRR submitted to EDS on 10/08/14     PASRR number received on 10/08/14     Existing PASRR number confirmed on       FL2 transmitted to all facilities in geographic area requested by pt/family on 10/08/14     FL2 transmitted to all facilities within larger geographic area on    10/11/14   Patient informed that his/her managed care company has contracts with or will negotiate with certain facilities, including the following:   [facility])     Yes   Patient/family informed of bed offers received.  Patient chooses bed at       Physician recommends and patient chooses bed at      Patient to be transferred to   on  .  Patient to be transferred to facility by [facility]ulance [facility])     Patient family notified on   of transfer.  Name of family member notified:        PHYSICIAN Please prepare priority discharge summary, including medications, Please sign  FL2, Please prepare prescriptions     Additional Comment:    _______________________________________________ Darylene Price, LCSW 10/11/2014, 9:52 PM

## 2014-10-11 NOTE — Clinical Social Work Note (Signed)
Clinical Social Work Assessment  Patient Details  Name: Charlene Vincent MRN: [id number] Date of Birth: 05/12/1934  Date of referral:  10/08/14               Reason for consult:  Facility Placement                Permission sought to share information with:  Family Supports, Chartered certified accountant granted to share information::  Yes, Verbal Permission Granted  Name::     [name], [name],  Local SNF's- ?[place] vs [place]   Housing/Transportation Living arrangements for the past 2 months:  Single Family Home Source of Information:  Patient Patient Interpreter Needed:  None Criminal Activity/Legal Involvement Pertinent to Current Situation/Hospitalization:  No - Comment as needed Significant Relationships:  Adult Children, Spouse Lives with:  Spouse Do you feel safe going back to the place where you live?  Yes Need for family participation in patient care:  Yes (Comment)  Care giving concerns: Lives at home with her husband but is currently requiring extensive assistance with ambulation and care; states husband's health is "fair."  Social Worker assessment / plan:  CSW met with this 80 year old female to discuss PT's recommendation for short term SNF.  She states that she had hoped to return home at discharge but that she was aware that PT recommends SNF.  Discussed bed search process and she agreed to for search to be initiated. She also wants to discuss with her husband Rex who was not present.  CSW left a message for husband to return call per OK from patient.  Fl2 to be completed and placed on chart for MD's signature.  Employment status:  Retired Nurse, adult PT Recommendations:  [place] / Referral to community resources:  [place]  Patient/Family's Response to care: Patient was interactive with CSW and stated that she is feeling "a little better."  Denies any current medical  concerns.  Patient/Family's Understanding of and Emotional Response to Diagnosis, Current Treatment, and Prognosis:  Patient relates "I feel weak and I want to get stronger- seems like it's taking a long time.  She had hoped to return home to her prior level of independence but acknowledges that at this time she requires more care.  She is agreeable to SNF as long as her husband is agreeable.  Emotional Assessment Appearance:  Appears stated age Attitude/Demeanor/Rapport:   (Calm. quiet, appropriate for age and situation) Affect (typically observed):  Quiet, Calm, Pleasant Orientation:  Oriented to Self, Oriented to Place, Oriented to  Time, Oriented to Situation Alcohol / Substance use:  Tobacco Use (Quit smoing in 2012) Psych involvement (Current and /or in the community):     Discharge Needs  Concerns to be addressed:  Care Coordination Readmission within the last 30 days:  No Current discharge risk:  None Barriers to Discharge:  No Barriers Identified   Estill Bakes 10/08/2014   5:42 PM

## 2014-10-11 NOTE — Consult Note (Signed)
WOC wound consult note  Veraflo Vac in place and functioning; 10 instill/10 soak/ vac time 2 hours, cont suction at . NS infusing and Vac dressing is in place with good seal.Plastics team plans to perform dressing change in the OR on Friday according to the EMR. Please re-consult if further assistance is needed. Thank-you,  Cammie Mcgee MSN, RN, CWOCN, [place], CNS [phone]

## 2014-10-11 NOTE — Progress Notes (Signed)
   Patient scheduled for debridement, application Integra 10/12/14. NPO p MN  I anticipate will be able to discharge to SNF vs home 10/13/14 from wound standpoint. PT has recommended SNF.  Glenna Fellows, MD [facility] [phone]

## 2014-10-11 NOTE — Progress Notes (Signed)
CSW spoke with patient about current bed offers; she defers decision to her husband Rex. CSW was able to contact husband via telephone and bed offers provided in [facility]. He stated that they live between [place] and [place] and he wants to consider placement in either county.  He currently does not have a preference.  CSW extended bed search this evening to [facility] and will follow up on possible bed offers in the morning.  Per report received this afternoon from [facility]- MD feels that patient be stable for d/c on Saturday. Anticipate need for a Wound Vac at d/c.  Awaiting determination from MD and CSW services will follow up with patient and husband re: additional bed offers. Current list of [facility] offers will be left in patient's room in the morning per husband's request for review.    Lorri Frederick. Jaci Lazier, [place] [phone]

## 2014-10-11 NOTE — Progress Notes (Signed)
Pt states she does not want to sign consent form until the morning when she can talk to the doctor

## 2014-10-11 NOTE — Progress Notes (Signed)
    I have scheduled a follow up appointment with Jones Skene PA-C on 12/25/14 and follow up echo on 12/06/14. Cardiology will sign off.    She will follow with Dr. Swaziland.    Cline Crock PA-C  [facility]

## 2014-10-11 NOTE — Clinical Social Work Placement (Addendum)
   CLINICAL SOCIAL WORK PLACEMENT  NOTE  Date:  10/11/2014  Patient Details  Name: Charlene Vincent MRN: [id number] Date of Birth: 04-10-1934  Clinical Social Work is seeking post-discharge placement for this patient at the Skilled  Nursing Facility level of care (*CSW will initial, date and re-position this form in  chart as items are completed):  Yes   Patient/family provided with [place] Clinical Social Work Department's list of facilities offering this level of care within the geographic area requested by the patient (or if unable, by the patient's family).  Yes   Patient/family informed of their freedom to choose among providers that offer the needed level of care, that participate in Medicare, Medicaid or managed care program needed by the patient, have an available bed and are willing to accept the patient.  Yes   Patient/family informed of [facility]'s ownership interest in [facility] and [facility], as well as of the fact that they are under no obligation to receive care at these facilities.  PASRR submitted to EDS on 10/08/14     PASRR number received on 10/08/14     Existing PASRR number confirmed on       FL2 transmitted to all facilities in geographic area requested by pt/family on 10/08/14     FL2 transmitted to all facilities within larger geographic area on       Patient informed that his/her managed care company has contracts with or will negotiate with certain facilities, including the following:   [facility])         Patient/family informed of bed offers received.  Patient chooses bed at       Physician recommends and patient chooses bed at      Patient to be transferred to   on  .  Patient to be transferred to facility by Ambulance [facility])     Patient family notified on   of transfer.  Name of family member notified:        PHYSICIAN Please prepare priority discharge summary, including medications, Please sign FL2,  Please prepare prescriptions     Additional Comment:    _______________________________________________ Darylene Price, LCSW 10/08/2014  5:55PM
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
## 2014-10-12 ENCOUNTER — Inpatient Hospital Stay (HOSPITAL_COMMUNITY): Payer: Medicare Other | Admitting: Anesthesiology

## 2014-10-12 ENCOUNTER — Encounter (HOSPITAL_COMMUNITY): Payer: Self-pay | Admitting: Certified Registered Nurse Anesthetist

## 2014-10-12 ENCOUNTER — Encounter (HOSPITAL_COMMUNITY)
Admission: EM | Disposition: A | Discharge status: Skilled Nursing Facility | Payer: Self-pay | Source: Home / Self Care | Attending: Cardiology

## 2014-10-12 DIAGNOSIS — E038 Other specified hypothyroidism: Secondary | ICD-10-CM

## 2014-10-12 DIAGNOSIS — E034 Atrophy of thyroid (acquired): Secondary | ICD-10-CM

## 2014-10-12 HISTORY — PX: SKIN FULL THICKNESS GRAFT: SHX442

## 2014-10-12 LAB — CULTURE, BLOOD (ROUTINE X 2)
CULTURE: NO GROWTH
Culture: NO GROWTH

## 2014-10-12 LAB — SURGICAL PCR SCREEN
MRSA, PCR: POSITIVE — AB
STAPHYLOCOCCUS AUREUS: POSITIVE — AB

## 2014-10-12 LAB — GLUCOSE, CAPILLARY: GLUCOSE-CAPILLARY: 100 mg/dL — AB (ref 65–99)

## 2014-10-12 SURGERY — APPLICATION, GRAFT, SKIN, FULL-THICKNESS
Anesthesia: General | Site: Foot | Laterality: Left

## 2014-10-12 MED ORDER — LACTATED RINGERS IV SOLN
INTRAVENOUS | Status: DC | PRN
  Administered 2014-10-12: 13:00:00 via INTRAVENOUS

## 2014-10-12 MED ORDER — ROSUVASTATIN CALCIUM 40 MG PO TABS
40.0000 mg | ORAL_TABLET | Freq: Every day | ORAL | Status: DC
Start: 2014-10-12 — End: 2014-12-19

## 2014-10-12 MED ORDER — ALPRAZOLAM 0.25 MG PO TABS: 0.2500 mg | ORAL_TABLET | Freq: Two times a day (BID) | ORAL | Status: DC | PRN

## 2014-10-12 MED ORDER — CARVEDILOL 3.125 MG PO TABS: 3.1250 mg | ORAL_TABLET | Freq: Two times a day (BID) | ORAL | Status: DC

## 2014-10-12 MED ORDER — PHENYLEPHRINE HCL 10 MG/ML IJ SOLN
INTRAMUSCULAR | Status: DC | PRN
  Administered 2014-10-12 (×9): 80 ug via INTRAVENOUS

## 2014-10-12 MED ORDER — 0.9 % SODIUM CHLORIDE (POUR BTL) OPTIME
TOPICAL | Status: DC | PRN
  Administered 2014-10-12: 1000 mL

## 2014-10-12 MED ORDER — LACTATED RINGERS IV SOLN
INTRAVENOUS | Status: DC
  Administered 2014-10-12: 12:00:00 via INTRAVENOUS

## 2014-10-12 MED ORDER — FENTANYL CITRATE (PF) 100 MCG/2ML IJ SOLN
INTRAMUSCULAR | Status: DC | PRN
  Administered 2014-10-12 (×4): 25 ug via INTRAVENOUS

## 2014-10-12 MED ORDER — FENTANYL CITRATE (PF) 100 MCG/2ML IJ SOLN
INTRAMUSCULAR | Status: AC
  Filled 2014-10-12: qty 2

## 2014-10-12 MED ORDER — PROMETHAZINE HCL 25 MG/ML IJ SOLN: 6.2500 mg | INTRAMUSCULAR | Status: DC | PRN

## 2014-10-12 MED ORDER — PHENYLEPHRINE 40 MCG/ML (10ML) SYRINGE FOR IV PUSH (FOR BLOOD PRESSURE SUPPORT)
PREFILLED_SYRINGE | INTRAVENOUS | Status: AC
  Filled 2014-10-12: qty 30

## 2014-10-12 MED ORDER — FENTANYL CITRATE (PF) 250 MCG/5ML IJ SOLN
INTRAMUSCULAR | Status: AC
  Filled 2014-10-12: qty 5

## 2014-10-12 MED ORDER — LISINOPRIL 2.5 MG PO TABS: 2.5000 mg | ORAL_TABLET | Freq: Every day | ORAL | Status: DC

## 2014-10-12 MED ORDER — LIDOCAINE HCL (CARDIAC) 20 MG/ML IV SOLN
INTRAVENOUS | Status: DC | PRN
  Administered 2014-10-12: 60 mg via INTRAVENOUS

## 2014-10-12 MED ORDER — BACITRACIN ZINC 500 UNIT/GM EX OINT
TOPICAL_OINTMENT | CUTANEOUS | Status: AC
  Filled 2014-10-12: qty 28.35

## 2014-10-12 MED ORDER — ETOMIDATE 2 MG/ML IV SOLN
INTRAVENOUS | Status: DC | PRN
  Administered 2014-10-12: 6 mg via INTRAVENOUS

## 2014-10-12 MED ORDER — PROPOFOL 10 MG/ML IV BOLUS
INTRAVENOUS | Status: DC | PRN
  Administered 2014-10-12: 40 mg via INTRAVENOUS

## 2014-10-12 MED ORDER — FENTANYL CITRATE (PF) 100 MCG/2ML IJ SOLN
25.0000 ug | INTRAMUSCULAR | Status: DC | PRN
  Administered 2014-10-12: 25 ug via INTRAVENOUS

## 2014-10-12 MED ORDER — CEFAZOLIN SODIUM-DEXTROSE 2-3 GM-% IV SOLR
INTRAVENOUS | Status: DC | PRN
  Administered 2014-10-12: 2 g via INTRAVENOUS

## 2014-10-12 MED ORDER — OXYCODONE-ACETAMINOPHEN 5-325 MG PO TABS: 1.0000 | ORAL_TABLET | Freq: Four times a day (QID) | ORAL | Status: DC | PRN

## 2014-10-12 MED ORDER — ASPIRIN 81 MG PO CHEW: 81.0000 mg | CHEWABLE_TABLET | Freq: Every day | ORAL | Status: DC

## 2014-10-12 MED ORDER — POLYETHYLENE GLYCOL 3350 17 G PO PACK: 17.0000 g | PACK | Freq: Every day | ORAL | Status: DC

## 2014-10-12 MED ORDER — SACCHAROMYCES BOULARDII 250 MG PO CAPS: 250.0000 mg | ORAL_CAPSULE | Freq: Two times a day (BID) | ORAL | Status: DC

## 2014-10-12 SURGICAL SUPPLY — 61 items
BENZOIN TINCTURE PRP APPL 2/3 (GAUZE/BANDAGES/DRESSINGS) ×3 IMPLANT
BLADE 10 SAFETY STRL DISP (BLADE) IMPLANT
BLADE SURG 10 STRL SS (BLADE) IMPLANT
BLADE SURG 15 STRL LF DISP TIS (BLADE) ×1 IMPLANT
BLADE SURG 15 STRL SS (BLADE) ×2
BLADE SURG ROTATE 9660 (MISCELLANEOUS) IMPLANT
BRUSH SCRUB EZ PLAIN DRY (MISCELLANEOUS) IMPLANT
CANISTER SUCT 1200ML W/VALVE (MISCELLANEOUS) IMPLANT
CANISTER WOUND CARE 500ML ATS (WOUND CARE) ×3 IMPLANT
CLEANER TIP ELECTROSURG 2X2 (MISCELLANEOUS) IMPLANT
CLOSURE WOUND 1/2 X4 (GAUZE/BANDAGES/DRESSINGS)
CORDS BIPOLAR (ELECTRODE) IMPLANT
COVER MAYO STAND STRL (DRAPES) ×3 IMPLANT
COVER SURGICAL LIGHT HANDLE (MISCELLANEOUS) ×3 IMPLANT
COVER TABLE BACK 60X90 (DRAPES) ×3 IMPLANT
DRAIN CHANNEL 15F RND FF W/TCR (WOUND CARE) IMPLANT
DRAIN WOUND SNY 15 RND (WOUND CARE) IMPLANT
DRAPE PROXIMA HALF (DRAPES) IMPLANT
DRAPE U-SHAPE 76X120 STRL (DRAPES) ×3 IMPLANT
DRSG ADAPTIC 3X8 NADH LF (GAUZE/BANDAGES/DRESSINGS) ×3 IMPLANT
DRSG VAC ATS MED SENSATRAC (GAUZE/BANDAGES/DRESSINGS) ×3 IMPLANT
ELECT COATED BLADE 2.86 ST (ELECTRODE) ×3 IMPLANT
ELECT REM PT RETURN 9FT ADLT (ELECTROSURGICAL) ×3
ELECT REM PT RETURN 9FT PED (ELECTROSURGICAL)
ELECTRODE REM PT RETRN 9FT PED (ELECTROSURGICAL) IMPLANT
ELECTRODE REM PT RTRN 9FT ADLT (ELECTROSURGICAL) ×1 IMPLANT
EVACUATOR SILICONE 100CC (DRAIN) IMPLANT
GAUZE SPONGE 2X2 8PLY STRL LF (GAUZE/BANDAGES/DRESSINGS) IMPLANT
GAUZE SPONGE 4X4 16PLY XRAY LF (GAUZE/BANDAGES/DRESSINGS) IMPLANT
GAUZE XEROFORM 1X8 LF (GAUZE/BANDAGES/DRESSINGS) IMPLANT
GLOVE BIO SURGEON STRL SZ 6 (GLOVE) ×3 IMPLANT
GLOVE BIOGEL PI IND STRL 7.0 (GLOVE) ×1 IMPLANT
GLOVE BIOGEL PI INDICATOR 7.0 (GLOVE) ×2
GLOVE SURG SS PI 6.0 STRL IVOR (GLOVE) ×3 IMPLANT
GLOVE SURG SS PI 6.5 STRL IVOR (GLOVE) ×3 IMPLANT
GOWN STRL REUS W/ TWL XL LVL3 (GOWN DISPOSABLE) ×2 IMPLANT
GOWN STRL REUS W/TWL XL LVL3 (GOWN DISPOSABLE) ×4
KIT BASIN OR (CUSTOM PROCEDURE TRAY) ×3 IMPLANT
MATRIX TISSUE MESHED 4X5 (Tissue) ×3 IMPLANT
NEEDLE 27GAX1X1/2 (NEEDLE) ×3 IMPLANT
NEEDLE HYPO 30X.5 LL (NEEDLE) IMPLANT
NS IRRIG 1000ML POUR BTL (IV SOLUTION) ×3 IMPLANT
PACK GENERAL/GYN (CUSTOM PROCEDURE TRAY) ×3 IMPLANT
PEN SKIN MARKING BROAD (MISCELLANEOUS) ×3 IMPLANT
PENCIL BUTTON HOLSTER BLD 10FT (ELECTRODE) ×3 IMPLANT
SPONGE GAUZE 2X2 STER 10/PKG (GAUZE/BANDAGES/DRESSINGS)
SPONGE LAP 18X18 X RAY DECT (DISPOSABLE) ×3 IMPLANT
STAPLER VISISTAT 35W (STAPLE) ×3 IMPLANT
STRIP CLOSURE SKIN 1/2X4 (GAUZE/BANDAGES/DRESSINGS) IMPLANT
SUT CHROMIC 4 0 P 3 18 (SUTURE) IMPLANT
SUT CHROMIC 4 0 PS 2 18 (SUTURE) ×3 IMPLANT
SUT CHROMIC 5 0 P 3 (SUTURE) ×6 IMPLANT
SUT MNCRL AB 4-0 PS2 18 (SUTURE) IMPLANT
SUT MON AB 5-0 P3 18 (SUTURE) IMPLANT
SUT PDS AB 2-0 CT1 27 (SUTURE) IMPLANT
SUT SILK 4 0 PS 2 (SUTURE) IMPLANT
SUT VIC AB 3-0 PS2 18 (SUTURE)
SUT VIC AB 3-0 PS2 18XBRD (SUTURE) IMPLANT
SUT VIC AB 4-0 PS2 27 (SUTURE) IMPLANT
SUT VICRYL 4-0 PS2 18IN ABS (SUTURE) IMPLANT
SYR CONTROL 10ML LL (SYRINGE) ×3 IMPLANT

## 2014-10-12 NOTE — Anesthesia Procedure Notes (Signed)
Procedure Name: LMA Insertion Date/Time: 10/12/2014 12:34 PM Performed by: Reine Just Pre-anesthesia Checklist: Patient identified, Emergency Drugs available, Suction available, Patient being monitored and Timeout performed Patient Re-evaluated:Patient Re-evaluated prior to inductionOxygen Delivery Method: Circle system utilized and Simple face mask Preoxygenation: Pre-oxygenation with 100% oxygen Intubation Type: IV induction Ventilation: Mask ventilation without difficulty LMA: LMA inserted LMA Size: 4.0 Number of attempts: 1 Airway Equipment and Method: Patient positioned with wedge pillow Placement Confirmation: positive ETCO2 and breath sounds checked- equal and bilateral Tube secured with: Tape Dental Injury: Teeth and Oropharynx as per pre-operative assessment

## 2014-10-12 NOTE — Interval H&P Note (Signed)
History and Physical Interval Note:  10/12/2014 11:19 AM  Charlene Vincent  has presented today for surgery, with the diagnosis of OPEN WOUND LEFT FOOT CLOSED MALLEOLAR FRACTURE   The various methods of treatment have been discussed with the patient and family. After consideration of risks, benefits and other options for treatment, the patient has consented to  Procedure(s): SURGICAL PREP FOR GRAFTING LEFT FOOT APPLICATION INTEGRA AND WOUND VAC  (Left) as a surgical intervention .  The patient's history has been reviewed, patient examined, no change in status, stable for surgery.  I have reviewed the patient's chart and labs.  Questions were answered to the patient's satisfaction.     Daeshon Grammatico

## 2014-10-12 NOTE — Progress Notes (Signed)
Called Dr.Fitzgerald for sign out  

## 2014-10-12 NOTE — Transfer of Care (Signed)
Immediate Anesthesia Transfer of Care Note  Patient: Charlene Vincent  Procedure(s) Performed: Procedure(s): Debridement left foot, application integra, wound vacuum-assisted closure (Left)  Patient Location: PACU  Anesthesia Type:General  Level of Consciousness: awake and alert   Airway & Oxygen Therapy: Patient Spontanous Breathing and Patient connected to face mask oxygen  Post-op Assessment: Report given to RN and Post -op Vital signs reviewed and stable  Post vital signs: Reviewed and stable  Last Vitals:  Filed Vitals:   10/12/14 1351  BP: 100/44  Pulse: 83  Temp:   Resp: 12    Complications: No apparent anesthesia complications

## 2014-10-12 NOTE — Progress Notes (Signed)
PT Cancellation Note  Patient Details Name: CHYRL POMROY MRN: [id number] DOB: [birth year]   Cancelled Treatment:    Reason Eval/Treat Not Completed: Medical issues which prohibited therapy (Pt is having possible low BP and going to surgery).  Will recheck tomorrow if able.   Ivar Drape 10/12/2014, 10:12 AM  Samul Dada, PT MS Acute Rehab Dept. Number: [facility] [id number] and [facility] [phone]

## 2014-10-12 NOTE — Op Note (Signed)
Operative Note   DATE OF OPERATION: 8.19.16  LOCATION: [facility]-inpatient  SURGICAL DIVISION: Plastic Surgery  PREOPERATIVE DIAGNOSES:  1. Degloving injury left foot 2. Closed left ankle fracture 3. Open wound left foot with complication  POSTOPERATIVE DIAGNOSES:  same  PROCEDURE:  1. Surgical preparation for grafting left foot 50 cm2 2. Application Integra dermal substrate 10 x 12.5 cm  SURGEON: Glenna Fellows MD MBA  ASSISTANT: none  ANESTHESIA:  General.   EBL: 50 ml  COMPLICATIONS: None immediate.   INDICATIONS FOR PROCEDURE:  The patient, Charlene Vincent, is a 80 y.o. female born on 01-19-1935, is here for dressing change, application Integra over large dorsal foot wound.   FINDINGS: 15 x 10 x 0.5 cm open wound. >90% granulation tissue. Extensor tendons visible but near complete granulation tissue present over these. No gross exposure bone. Evidence epidermolysis skin adjacent to wound from VAC sponge and dressing.  DESCRIPTION OF PROCEDURE:  The patient's operative site was marked with the patient in the preoperative area. The patient was taken to the operating room. SCDs were placed and IV antibiotics were given. The patient's operative site was prepped and draped in a sterile fashion. A time out was performed and all information was confirmed to be correct. Wound irrigated thoroughly. Sharp excision of single area necrotic full thickness skin hallux base. Excised surrounding epidermolysed skin. Wound treated by curretage. Hemostasis obtain. Premeshed Integra applied to wound and secured with 4-0 and 5-0 chromic. Adaptic and VAC sponge applied. VAC set to 125 mm Hg continuous.   The patient was allowed to wake from anesthesia, extubated and taken to the recovery room in satisfactory condition.   SPECIMENS: none  DRAINS: none  Glenna Fellows, MD [facility] Plastic & Reconstructive Surgery [phone]

## 2014-10-12 NOTE — Clinical Social Work Placement (Signed)
   CLINICAL SOCIAL WORK PLACEMENT  NOTE  Date:  10/12/2014  Patient Details  Name: Charlene Vincent MRN: [id number] Date of Birth: [birth year]  Clinical Social Work is seeking post-discharge placement for this patient at the Skilled  Nursing Facility level of care (*CSW will initial, date and re-position this form in  chart as items are completed):  Yes   Patient/family provided with [facility] Clinical Social Work Department's list of facilities offering this level of care within the geographic area requested by the patient (or if unable, by the patient's family).  Yes   Patient/family informed of their freedom to choose among providers that offer the needed level of care, that participate in Medicare, Medicaid or managed care program needed by the patient, have an available bed and are willing to accept the patient.  Yes   Patient/family informed of [facility]'s ownership interest in [facility] and [facility], as well as of the fact that they are under no obligation to receive care at these facilities.  PASRR submitted to EDS on 10/08/14     PASRR number received on 10/08/14     Existing PASRR number confirmed on       FL2 transmitted to all facilities in geographic area requested by pt/family on 10/08/14     FL2 transmitted to all facilities within larger geographic area on       Patient informed that his/her managed care company has contracts with or will negotiate with certain facilities, including the following:   [facility])     Yes   Patient/family informed of bed offers received.  Patient chooses bed at [facility]     Physician recommends and patient chooses bed at      Patient to be transferred to [facility] [place] on  .  Patient to be transferred to facility by [facility])     Patient family notified on   of transfer.  Name of family member notified:        PHYSICIAN Please prepare priority discharge summary,  including medications, Please sign FL2, Please prepare prescriptions     Additional Comment:    _______________________________________________ Sharol Harness, Theresia Majors [phone]

## 2014-10-12 NOTE — Progress Notes (Signed)
Report called to [facility] in short stay.

## 2014-10-12 NOTE — Progress Notes (Signed)
CSW Proofreader) spoke with pt husband and provided bed offers. Pt husband would like for pt to dc to [facility] [place] because of proximity to their home in comparison to other facilities that are able to offer. CSW asked if information could be relayed to his sons and/or daughter-in-law since they have already called CSW this morning asking for updates. Pt husband agreeable to this. CSW updated pt daughter-in-law over the phone. CSW left voicemail for facility to notify of bed acceptance and potential for dc over the weekend.  Dennys Traughber, LCSWA [phone]

## 2014-10-12 NOTE — H&P (View-Only) (Signed)
Reason for Consult:open wound left foot, degloving injury Referring Physician: Dr. Marlou Sa Location: [facility] Date 10/10/14  Charlene Vincent is an 80 y.o. female.   HPI: Transferred from [facility] and admitted 10/02/14 following fall from standing. Suffered degloving injury soft tissue left foot as well as malleolar fracture. Soft tissue flap approximated on admission by Dr. Marlou Sa and this went on to develop full thickness necrosis. She has undergone debridement of this and Plastic Surgery consulted for treatment of wound. Patient experienced STEMI post injury, angiography with no significant lesions, repeat ECHO with EF 30-35%.  Plan to be NWB over LLE for at least 3 weeks with regards to fracture.  Past Medical History  Diagnosis Date  . Takotsubo cardiomyopathy EF 60%- 12/2010    s/p cath July 2012. EF is 35 to 40%. 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 Hypothyroidism   . Anemia   . Costochondritis   . Arthritis   . Cellulitis   . Shingles     Past Surgical History  Procedure Laterality Date  . Inguinal hernia repair    . Cardiac catheterization  September 10, 2010    EF 35 to 40%. No significant CAD. ? takotsubo cardiomyopathy  . Cardiac catheterization N/A 10/02/2014    Procedure: Left Heart Cath and Coronary Angiography;  Surgeon: Peter M Martinique, MD;  Location: [facility];  Service: Cardiovascular;  Laterality: N/A;    Family History  Problem Relation Age of Onset  . Heart disease Father   . Heart disease Brother     Social History:  reports that she quit smoking about 4 years ago. Her smoking use included Cigarettes. She smoked 0.50 packs per day. She does not have any smokeless tobacco history on file. She reports that she does not drink alcohol or use illicit drugs.  Allergies:  Allergies  Allergen Reactions  . Aspirin     But taking low dose therapy without problems.  . Penicillins Rash    Medications: I have reviewed the patient's current  medications.  Results for orders placed or performed during the hospital encounter of 10/02/14 (from the past 48 hour(s))  Basic metabolic panel     Status: Abnormal   Collection Time: 10/09/14  4:14 AM  Result Value Ref Range   Sodium 126 (L) 135 - 145 mmol/L   Potassium 3.6 3.5 - 5.1 mmol/L   Chloride 94 (L) 101 - 111 mmol/L   CO2 26 22 - 32 mmol/L   Glucose, Bld 95 65 - 99 mg/dL   BUN 10 6 - 20 mg/dL   Creatinine, Ser 0.67 0.44 - 1.00 mg/dL   Calcium 7.7 (L) 8.9 - 10.3 mg/dL   GFR calc non Af Amer >60 >60 mL/min   GFR calc Af Amer >60 >60 mL/min    Comment: (NOTE) The eGFR has been calculated using the CKD EPI equation. This calculation has not been validated in all clinical situations. eGFR's persistently <60 mL/min signify possible Chronic Kidney Disease.    Anion gap 6 5 - 15  Cortisol     Status: None   Collection Time: 10/09/14  4:14 AM  Result Value Ref Range   Cortisol, Plasma 6.1 ug/dL    Comment: (NOTE) AM    6.7 - 22.6 ug/dL PM   <10.0       ug/dL     ROS Blood pressure 109/47, pulse 80, temperature 98.1 F (36.7 C), temperature source Oral, resp. rate 18, height 5' 7" (  1.702 m), weight 60.6 kg (133 lb 9.6 oz), SpO2 97 %. Physical Exam  Afebrile post op Alert, NAD Left foot with VAC in place, less edema toes, no cellulitis intraop exam on 10/09/14: full thickness skin and subcutaneous tissue loss over dorsal foot from base of toes to over dorsal ankle 15 cm x 10 cm x 0.5 cm No gross pus, no gross exposure bone, extensor tendons exposed with paratenon intact Toes perfused  Assessment/Plan:  Open wound left foot. Plan repeat exam/washout and placement Integra tentative 10/12/14. Will replace VAC at that time and would be stable for discharge from wound standpoint at that time with home VAC. Reviewed large wound and may require skin grafting in few weeks time if medically stable, she desires this or will need prolonged wound care.  Currently on bed rest. If ok  per primary team, needs PT/OT eval for transfer to WC with elevated leg rest, NWB over LLE. Disposition would be home with WC with elevated leg rest, bedside commode (patient reports use pot for night time bathroom needs pre injury), and home health for wound care and home VAC. Alternative SNF.    , MD MBA Plastic & Reconstructive Surgery [phone]  

## 2014-10-12 NOTE — Progress Notes (Signed)
  See op note.   Wound clean, near complete granulation. Integra applied.  Ok to transfer to SNF from wound standpoint.  Needs VAC change on 10/17/14: apply adaptic to wound over Integra (silicone sheeting is in place over Integra,leave in place). Reapply VAC or NPWT to 125 mm Hg continuous. VAC change twice weekly. Keep leg elevated while sitting, in bed.   F/u Wound Center 10 days. Call to make appt for 10/22/14.  Glenna Fellows, MD [facility] Plastic & Reconstructive Surgery [phone]
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
## 2014-10-12 NOTE — Anesthesia Preprocedure Evaluation (Addendum)
Anesthesia Evaluation  Patient identified by MRN, date of birth, ID band Patient awake    Reviewed: Allergy & Precautions, NPO status , Patient's Chart, lab work & pertinent test results  Airway Mallampati: II  TM Distance: >3 FB Neck ROM: Full    Dental  (+) Edentulous Upper, Edentulous Lower, Dental Advisory Given   Pulmonary former smoker,  breath sounds clear to auscultation        Cardiovascular hypertension, Pt. on medications + CAD Rhythm:Regular Rate:Normal     Neuro/Psych    GI/Hepatic   Endo/Other    Renal/GU      Musculoskeletal  (+) Arthritis -,   Abdominal   Peds  Hematology   Anesthesia Other Findings   Reproductive/Obstetrics                           Anesthesia Physical Anesthesia Plan  ASA: III  Anesthesia Plan: General   Post-op Pain Management:    Induction: Intravenous  Airway Management Planned: LMA  Additional Equipment:   Intra-op Plan:   Post-operative Plan: Extubation in OR  Informed Consent: I have reviewed the patients History and Physical, chart, labs and discussed the procedure including the risks, benefits and alternatives for the proposed anesthesia with the patient or authorized representative who has indicated his/her understanding and acceptance.   Dental advisory given  Plan Discussed with: CRNA and Anesthesiologist  Anesthesia Plan Comments:         Anesthesia Quick Evaluation                                  Anesthesia Evaluation  Patient identified by MRN, date of birth, ID band Patient awake    Reviewed: Allergy & Precautions, NPO status , Patient's Chart, lab work & pertinent test results, reviewed documented beta blocker date and time   Airway Mallampati: II  TM Distance: >3 FB Neck ROM: Full    Dental   Pulmonary former smoker,  breath sounds clear to auscultation        Cardiovascular hypertension, Pt.  on medications and Pt. on home beta blockers + Past MI and +CHF Rhythm:Regular Rate:Normal  EF 30-35%. Takotsubo CM   Neuro/Psych negative neurological ROS     GI/Hepatic negative GI ROS, Neg liver ROS,   Endo/Other  Hypothyroidism   Renal/GU negative Renal ROS     Musculoskeletal  (+) Arthritis -,   Abdominal   Peds  Hematology  (+) anemia ,   Anesthesia Other Findings   Reproductive/Obstetrics                            Anesthesia Physical Anesthesia Plan  ASA: III  Anesthesia Plan: General   Post-op Pain Management:    Induction: Intravenous  Airway Management Planned: LMA  Additional Equipment:   Intra-op Plan:   Post-operative Plan: Extubation in OR  Informed Consent: I have reviewed the patients History and Physical, chart, labs and discussed the procedure including the risks, benefits and alternatives for the proposed anesthesia with the patient or authorized representative who has indicated his/her understanding and acceptance.   Dental advisory given  Plan Discussed with: CRNA  Anesthesia Plan Comments:        Anesthesia Quick Evaluation
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
## 2014-10-12 NOTE — Progress Notes (Signed)
CSW Proofreader) spoke with admission at [facility]. They confirmed they are able to accept pt over the weekend. Weekend CSW will need to fax dc summary to [phone] and call facility main number and ask for Nursing Director.  Andi Mahaffy, LCSWA [phone]

## 2014-10-13 NOTE — Progress Notes (Signed)
Report called to Talbert Nan, LPN, [facility] receiving nurse. Questions answered. Agreed upon sending patient to facility with foot wrapped post  Wound VAC disconnection. Will arrange PTAR to transfer out. Family currently at the bedside; aware of discharge.

## 2014-10-13 NOTE — Progress Notes (Signed)
[facility] arranged on behalf of pt.  Family informed and agreeable.

## 2014-10-13 NOTE — Progress Notes (Signed)
14 fr. Foley inserted. Pt tolerated.

## 2014-10-13 NOTE — Clinical Social Work Placement (Signed)
   CLINICAL SOCIAL WORK PLACEMENT  NOTE  Date:  10/13/2014  Patient Details  Name: Charlene Vincent MRN: [id number] Date of Birth: 1935/01/29  Clinical Social Work is seeking post-discharge placement for this patient at the Skilled  Nursing Facility level of care (*CSW will initial, date and re-position this form in  chart as items are completed):  Yes   Patient/family provided with [place] Clinical Social Work Department's list of facilities offering this level of care within the geographic area requested by the patient (or if unable, by the patient's family).  Yes   Patient/family informed of their freedom to choose among providers that offer the needed level of care, that participate in Medicare, Medicaid or managed care program needed by the patient, have an available bed and are willing to accept the patient.  Yes   Patient/family informed of [facility]'s ownership interest in [facility] and [facility], as well as of the fact that they are under no obligation to receive care at these facilities.  PASRR submitted to EDS on 10/08/14     PASRR number received on 10/08/14     Existing PASRR number confirmed on       FL2 transmitted to all facilities in geographic area requested by pt/family on 10/08/14     FL2 transmitted to all facilities within larger geographic area on       Patient informed that his/her managed care company has contracts with or will negotiate with certain facilities, including the following:   [facility])     Yes   Patient/family informed of bed offers received.  Patient chooses bed at [facility]     Physician recommends and patient chooses bed at      Patient to be transferred to [facility] at [place] on  .  Patient to be transferred to facility by [facility])     Patient family notified on   10/13/14  Name of family member notified:      Son  PHYSICIAN Please prepare priority discharge summary,  including medications, Please sign FL2, Please prepare prescriptions     Additional Comment:    _______________________________________________ Linna Caprice, LCSW 10/13/2014, 1:54 PM

## 2014-10-14 ENCOUNTER — Encounter (HOSPITAL_COMMUNITY): Payer: Self-pay | Admitting: Plastic Surgery

## 2014-10-15 NOTE — Anesthesia Postprocedure Evaluation (Signed)
  Anesthesia Post-op Note  Patient: Charlene Vincent  Procedure(s) Performed: Procedure(s): Debridement left foot, application integra, wound vacuum-assisted closure (Left)  Patient Location: PACU  Anesthesia Type:General  Level of Consciousness: awake, alert  and oriented  Airway and Oxygen Therapy: Patient Spontanous Breathing  Post-op Pain: none  Post-op Assessment: Post-op Vital signs reviewed LLE Motor Response: Purposeful movement, Responds to commands LLE Sensation: Full sensation RLE Motor Response: Purposeful movement, Responds to commands        Post-op Vital Signs: Reviewed  Last Vitals:  Filed Vitals:   10/13/14 0453  BP: 112/59  Pulse: 72  Temp: 36.7 C  Resp: 18    Complications: No apparent anesthesia complications

## 2014-10-25 ENCOUNTER — Ambulatory Visit (INDEPENDENT_AMBULATORY_CARE_PROVIDER_SITE_OTHER): Payer: Medicare Other | Admitting: Physician Assistant

## 2014-10-25 ENCOUNTER — Encounter: Payer: Self-pay | Admitting: Physician Assistant

## 2014-10-25 VITALS — BP 104/78 | HR 88 | Ht 64.0 in | Wt 133.0 lb

## 2014-10-25 DIAGNOSIS — I10 Essential (primary) hypertension: Secondary | ICD-10-CM

## 2014-10-25 DIAGNOSIS — I5181 Takotsubo syndrome: Secondary | ICD-10-CM | POA: Diagnosis not present

## 2014-10-25 DIAGNOSIS — E038 Other specified hypothyroidism: Secondary | ICD-10-CM

## 2014-10-25 DIAGNOSIS — E785 Hyperlipidemia, unspecified: Secondary | ICD-10-CM

## 2014-10-25 NOTE — Progress Notes (Signed)
Patient ID: Charlene Vincent, female   DOB: 1934-11-17, 80 y.o.   MRN: [id number]    Date:  10/25/2014   ID:  Charlene Vincent, DOB Jul 20, 1934, MRN [id number]  PCP:  Jani Gravel, MD  Primary Cardiologist:   [place]   Chief Complaint  Patient presents with  . Follow-up     History of Present Illness: Charlene Vincent is a 80 y.o. female with a history of Takotsubo CM. She went to [facility] with a foot injury from a mechanical fall. Her foot was treated and a laceration was repaired. She has a large pedal hematoma as well as foot/ankle fractures.   Pt was on the monitor and there was concern for arrhythmia. An ECG was performed and was significantly abnormal. Her troponin was elevated. The ECG met STEMI criteria and she was transferred emergently to [facility] and taken directly to the cath lab.    Left heart catheterization  10/02/2014 revealed normal coronary arteries.  LVEF was 25-35%.   Hyperdynamic base consistent with tokotsubo  Syndrome.   She had an echocardiogram on 10/03/2014 which confirmed an ejection fraction of 30-35%. There is akinesis of the mid apical anteroseptal, inferior and apical myocardium. Grade 2 diastolic dysfunction. Moderate LVH   The patient presents for post-hospital follow-up. She doesn't have any particular complaints   Some occasional pain in her left foot. She is staying at a skilled nursing facility while her foot heals up. The patient currently denies nausea, vomiting, fever, chest pain, shortness of breath, orthopnea, dizziness, PND, cough, congestion, abdominal pain, hematochezia, melena, lower extremity edema.  Wt Readings from Last 3 Encounters:  10/25/14 133 lb (60.328 kg)  10/13/14 136 lb 11.2 oz (62.007 kg)  09/17/14 134 lb (60.782 kg)     Past Medical History  Diagnosis Date  . Takotsubo cardiomyopathy     a. 2012  b. LHC in 09/2014 w/ EF 30-35%  . Hypothyroidism   . Anemia   . Costochondritis   . Arthritis   . Cellulitis   . Shingles      Current Outpatient Prescriptions  Medication Sig Dispense Refill  . ALPRAZolam (XANAX) 0.25 MG tablet Take 1 tablet (0.25 mg total) by mouth 2 (two) times daily as needed for anxiety. 20 tablet 0  . aspirin 81 MG chewable tablet Chew 1 tablet (81 mg total) by mouth daily.    . carvedilol (COREG) 3.125 MG tablet Take 1 tablet (3.125 mg total) by mouth 2 (two) times daily with a meal.    . HYDROcodone-acetaminophen (NORCO/VICODIN) 5-325 MG per tablet Take 1 tablet by mouth as needed.  0  . hydrOXYzine (ATARAX/VISTARIL) 10 MG tablet Take 1 tablet by mouth as needed.  0  . lisinopril (PRINIVIL,ZESTRIL) 2.5 MG tablet Take 1 tablet (2.5 mg total) by mouth daily.    Marland Kitchen oxyCODONE-acetaminophen (PERCOCET/ROXICET) 5-325 MG per tablet Take 1 tablet by mouth every 6 (six) hours as needed for moderate pain. 30 tablet 0  . polyethylene glycol (MIRALAX / GLYCOLAX) packet Take 17 g by mouth daily.    . rosuvastatin (CRESTOR) 40 MG tablet Take 1 tablet (40 mg total) by mouth at bedtime.    . saccharomyces boulardii (FLORASTOR) 250 MG capsule Take 1 capsule (250 mg total) by mouth 2 (two) times daily.     No current facility-administered medications for this visit.    Allergies:    Allergies  Allergen Reactions  . Aspirin     But taking low dose therapy without problems.  Marland Kitchen  Penicillins Rash    Social History:  The patient  reports that she quit smoking about 4 years ago. Her smoking use included Cigarettes. She smoked 0.50 packs per day. She does not have any smokeless tobacco history on file. She reports that she does not drink alcohol or use illicit drugs.   Family history:   Family History  Problem Relation Age of Onset  . Heart disease Father   . Heart disease Brother     ROS:  Please see the history of present illness.  All other systems reviewed and negative.   PHYSICAL EXAM: VS:  BP 104/78 mmHg  Pulse 88  Ht _0  (1.626 m)  Wt 133 lb (60.328 kg)  BMI 22.82 kg/m2 Well nourished,  well developed, in no acute distress HEENT: Pupils are equal round react to light accommodation extraocular movements are intact.  Neck: no JVDNo cervical lymphadenopathy. Cardiac: Regular rate and rhythm without murmurs rubs or gallops. Lungs:  clear to auscultation bilaterally, no wheezing, rhonchi or rales Abd: soft, nontender, positive bowel sounds all quadrants, no hepatosplenomegaly Ext: no lower extremity edema.  2+ radial and dorsalis pedis pulses.   Wound VAC in place on the left foot. Skin: warm and dry Neuro:  Grossly normal  Lipid Panel     Component Value Date/Time   CHOL 82 10/04/2014 0300   TRIG 67 10/04/2014 0300   HDL 32* 10/04/2014 0300   CHOLHDL 2.6 10/04/2014 0300   VLDL 13 10/04/2014 0300   LDLCALC 37 10/04/2014 0300      ASSESSMENT AND PLAN:  Problem List Items Addressed This Visit    Takotsubo cardiomyopathy - Primary   Hypothyroidism (Chronic)   HTN (hypertension) (Chronic)      Takotsubo CM-  LCH on 10/02/14 w/ severe LV dysfunction and marked WMA of the mid to apical segments with hyperdynamic base c/w Takotsubo syndrome.  EF is 30-35%.  On coreg 3.125 mg BID and lisinopril 2.40m qd. Her B/P is low- no room to uptitrate meds.  She appears euvolemic. She has a follow-up echocardiogram scheduled for 12/06/2014.    HLD-    Well-controlled.  continue statin.   Hypothyroidism-   TSH 4.6. On Synthroid  Essential hypertension   no room to titrate Coreg or lisinopril due to low blood pressure

## 2014-10-25 NOTE — Patient Instructions (Signed)
Keep appointment for Echo 12/06/14 at 11:30 am at [facility] office   Your physician recommends that you schedule a follow-up appointment with Dr.Jordan after echo Tuesday 12/11/14 at 9:30 am at [facility] office

## 2014-11-13 ENCOUNTER — Encounter (HOSPITAL_BASED_OUTPATIENT_CLINIC_OR_DEPARTMENT_OTHER): Payer: Medicare Other | Attending: General Surgery

## 2014-11-13 DIAGNOSIS — I5181 Takotsubo syndrome: Secondary | ICD-10-CM | POA: Insufficient documentation

## 2014-11-13 DIAGNOSIS — D649 Anemia, unspecified: Secondary | ICD-10-CM | POA: Insufficient documentation

## 2014-11-13 DIAGNOSIS — I739 Peripheral vascular disease, unspecified: Secondary | ICD-10-CM | POA: Diagnosis not present

## 2014-11-13 DIAGNOSIS — I10 Essential (primary) hypertension: Secondary | ICD-10-CM | POA: Insufficient documentation

## 2014-11-13 DIAGNOSIS — M1990 Unspecified osteoarthritis, unspecified site: Secondary | ICD-10-CM | POA: Diagnosis not present

## 2014-11-13 DIAGNOSIS — S91302A Unspecified open wound, left foot, initial encounter: Secondary | ICD-10-CM | POA: Diagnosis present

## 2014-11-13 DIAGNOSIS — I252 Old myocardial infarction: Secondary | ICD-10-CM | POA: Diagnosis not present

## 2014-11-13 DIAGNOSIS — X58XXXA Exposure to other specified factors, initial encounter: Secondary | ICD-10-CM | POA: Insufficient documentation

## 2014-11-13 DIAGNOSIS — I2510 Atherosclerotic heart disease of native coronary artery without angina pectoris: Secondary | ICD-10-CM | POA: Diagnosis not present

## 2014-11-19 DIAGNOSIS — I2510 Atherosclerotic heart disease of native coronary artery without angina pectoris: Secondary | ICD-10-CM | POA: Diagnosis not present

## 2014-11-19 DIAGNOSIS — I10 Essential (primary) hypertension: Secondary | ICD-10-CM | POA: Diagnosis not present

## 2014-11-19 DIAGNOSIS — I5181 Takotsubo syndrome: Secondary | ICD-10-CM | POA: Diagnosis not present

## 2014-11-19 DIAGNOSIS — S91302A Unspecified open wound, left foot, initial encounter: Secondary | ICD-10-CM | POA: Diagnosis not present

## 2014-12-03 ENCOUNTER — Encounter (HOSPITAL_BASED_OUTPATIENT_CLINIC_OR_DEPARTMENT_OTHER): Payer: Medicare Other | Attending: Plastic Surgery

## 2014-12-03 DIAGNOSIS — I2510 Atherosclerotic heart disease of native coronary artery without angina pectoris: Secondary | ICD-10-CM | POA: Insufficient documentation

## 2014-12-03 DIAGNOSIS — I5181 Takotsubo syndrome: Secondary | ICD-10-CM | POA: Insufficient documentation

## 2014-12-03 DIAGNOSIS — M1990 Unspecified osteoarthritis, unspecified site: Secondary | ICD-10-CM | POA: Diagnosis not present

## 2014-12-03 DIAGNOSIS — S91302D Unspecified open wound, left foot, subsequent encounter: Secondary | ICD-10-CM | POA: Diagnosis present

## 2014-12-03 DIAGNOSIS — W19XXXD Unspecified fall, subsequent encounter: Secondary | ICD-10-CM | POA: Insufficient documentation

## 2014-12-03 DIAGNOSIS — I252 Old myocardial infarction: Secondary | ICD-10-CM | POA: Diagnosis not present

## 2014-12-03 DIAGNOSIS — I739 Peripheral vascular disease, unspecified: Secondary | ICD-10-CM | POA: Insufficient documentation

## 2014-12-03 DIAGNOSIS — I110 Hypertensive heart disease with heart failure: Secondary | ICD-10-CM | POA: Diagnosis not present

## 2014-12-03 DIAGNOSIS — I509 Heart failure, unspecified: Secondary | ICD-10-CM | POA: Diagnosis not present

## 2014-12-06 ENCOUNTER — Ambulatory Visit (HOSPITAL_COMMUNITY): Payer: Medicare Other | Attending: Cardiology

## 2014-12-06 ENCOUNTER — Other Ambulatory Visit: Payer: Self-pay

## 2014-12-06 DIAGNOSIS — I517 Cardiomegaly: Secondary | ICD-10-CM | POA: Diagnosis not present

## 2014-12-06 DIAGNOSIS — I429 Cardiomyopathy, unspecified: Secondary | ICD-10-CM | POA: Diagnosis present

## 2014-12-06 DIAGNOSIS — I10 Essential (primary) hypertension: Secondary | ICD-10-CM | POA: Insufficient documentation

## 2014-12-06 DIAGNOSIS — F17200 Nicotine dependence, unspecified, uncomplicated: Secondary | ICD-10-CM | POA: Diagnosis not present

## 2014-12-06 DIAGNOSIS — E785 Hyperlipidemia, unspecified: Secondary | ICD-10-CM | POA: Insufficient documentation

## 2014-12-11 ENCOUNTER — Ambulatory Visit: Payer: Medicare Other | Admitting: Cardiology

## 2014-12-14 ENCOUNTER — Ambulatory Visit: Payer: Self-pay | Admitting: Physician Assistant

## 2014-12-19 ENCOUNTER — Encounter (HOSPITAL_COMMUNITY): Payer: Self-pay | Admitting: Emergency Medicine

## 2014-12-19 ENCOUNTER — Encounter (HOSPITAL_COMMUNITY): Payer: Self-pay

## 2014-12-19 ENCOUNTER — Encounter (HOSPITAL_COMMUNITY)
Admission: RE | Admit: 2014-12-19 | Discharge: 2014-12-19 | Disposition: A | Discharge status: Home / Self Care | Payer: Medicare Other | Source: Ambulatory Visit | Attending: Plastic Surgery | Admitting: Plastic Surgery

## 2014-12-19 ENCOUNTER — Encounter (HOSPITAL_COMMUNITY): Payer: Self-pay | Admitting: Anesthesiology

## 2014-12-19 DIAGNOSIS — Z01818 Encounter for other preprocedural examination: Secondary | ICD-10-CM | POA: Insufficient documentation

## 2014-12-19 DIAGNOSIS — Z79899 Other long term (current) drug therapy: Secondary | ICD-10-CM | POA: Diagnosis not present

## 2014-12-19 DIAGNOSIS — Z7982 Long term (current) use of aspirin: Secondary | ICD-10-CM | POA: Diagnosis not present

## 2014-12-19 DIAGNOSIS — D649 Anemia, unspecified: Secondary | ICD-10-CM | POA: Diagnosis not present

## 2014-12-19 DIAGNOSIS — L97529 Non-pressure chronic ulcer of other part of left foot with unspecified severity: Secondary | ICD-10-CM | POA: Insufficient documentation

## 2014-12-19 DIAGNOSIS — Z87891 Personal history of nicotine dependence: Secondary | ICD-10-CM | POA: Diagnosis not present

## 2014-12-19 DIAGNOSIS — Z01812 Encounter for preprocedural laboratory examination: Secondary | ICD-10-CM | POA: Insufficient documentation

## 2014-12-19 DIAGNOSIS — I252 Old myocardial infarction: Secondary | ICD-10-CM | POA: Diagnosis not present

## 2014-12-19 DIAGNOSIS — E039 Hypothyroidism, unspecified: Secondary | ICD-10-CM | POA: Diagnosis not present

## 2014-12-19 HISTORY — DX: Acute myocardial infarction, unspecified: I21.9

## 2014-12-19 HISTORY — DX: Headache, unspecified: R51.9

## 2014-12-19 HISTORY — DX: Pneumonia, unspecified organism: J18.9

## 2014-12-19 HISTORY — DX: Headache: R51

## 2014-12-19 LAB — CBC
HEMATOCRIT: 32.4 % — AB (ref 36.0–46.0)
Hemoglobin: 10.4 g/dL — ABNORMAL LOW (ref 12.0–15.0)
MCH: 28.8 pg (ref 26.0–34.0)
MCHC: 32.1 g/dL (ref 30.0–36.0)
MCV: 89.8 fL (ref 78.0–100.0)
PLATELETS: 271 10*3/uL (ref 150–400)
RBC: 3.61 MIL/uL — ABNORMAL LOW (ref 3.87–5.11)
RDW: 18.4 % — AB (ref 11.5–15.5)
WBC: 7.4 10*3/uL (ref 4.0–10.5)

## 2014-12-19 LAB — BASIC METABOLIC PANEL
ANION GAP: 8 (ref 5–15)
BUN: 9 mg/dL (ref 6–20)
CO2: 27 mmol/L (ref 22–32)
Calcium: 9.2 mg/dL (ref 8.9–10.3)
Chloride: 102 mmol/L (ref 101–111)
Creatinine, Ser: 0.57 mg/dL (ref 0.44–1.00)
GLUCOSE: 110 mg/dL — AB (ref 65–99)
POTASSIUM: 3.9 mmol/L (ref 3.5–5.1)
Sodium: 137 mmol/L (ref 135–145)

## 2014-12-19 NOTE — Progress Notes (Signed)
Anesthesia Chart Review:  Pt is 80 year old female scheduled for split thickness skin graft from L thigh to L foot, application of acell to L thigh on 12/28/2014 with Dr. Leta Baptist.   PCP is Dr. Pearson Grippe. Cardiologist is Dr. Swaziland.   PMH includes: MI (09/2014), Takotsubo cardiomyopathy, hypothyroidism, anemia. Former smoker. BMI 22. S/p L foot debridement 10/12/14 and 10/09/14.   Medications include: ASA, lipitor, carvedilol, iron, levothyroxine, lisinopril, potassium.   Preoperative labs reviewed.    1 view CXR 10/07/2014 reviewed: No pulmonary edema. L basilar atelectasis or infiltrate.   EKG 10/04/2014: NSR. Cannot rule out Inferior infarct, age undetermined. T wave abnormality, consider anterolateral ischemia. Prolonged QT. More pronounced anterolateral STT changes suggests evolving MI.  Echo 12/06/2014:  - Left ventricle: The cavity size was normal. There was mild focal basal hypertrophy of the septum. Systolic function was mildly reduced. The estimated ejection fraction was in the range of 45% to 50%. Wall motion was normal; there were no regional wall motion abnormalities. Doppler parameters are consistent with abnormal left ventricular relaxation (grade 1 diastolic dysfunction). There was no evidence of elevated ventricular filling pressure by Doppler parameters. - Aortic valve: Trileaflet; normal thickness leaflets. There was no regurgitation. - Aortic root: The aortic root was normal in size. - Mitral valve: There was no regurgitation. - Right ventricle: The cavity size was normal. Wall thickness was normal. Systolic function was normal. - Right atrium: The atrium was normal in size. - Pulmonary arteries: Systolic pressure was within the normal range. - Inferior vena cava: The vessel was normal in size. - Pericardium, extracardiac: There was no pericardial effusion. - Impressions: Compared to the report from 10/04/2014 (images not available) LVEF is now improved, estimated at 45-50%.  There is hypokinesis of the mid and apical inferior walls.  Cardiac cath 10/02/2014:  1. Normal coronary anatomy 2. Severe LV dysfunction. Marked wall motion abnormality of the mid to apical segments with hyperdynamic base consistent with Takotsubo syndrome  If no changes, I anticipate pt can proceed with surgery as scheduled.   Rica Mast, FNP-BC [facility]/Anesthesiology Phone: [phone] 12/19/2014 4:05 PM

## 2014-12-19 NOTE — Progress Notes (Addendum)
PCP is Dr Candelaria Stagers in [place]. Cardiologist is Dr Swaziland Card cath noted from 10-02-14 Dressing noted to left lower leg and foot. Lives at [facility] [place].

## 2014-12-19 NOTE — Pre-Procedure Instructions (Signed)
Charlene Vincent  12/19/2014      [id number] - [place], [place] - [address] [place] [address] Phone: [phone] Fax: [phone]    Your procedure is scheduled on Nov 4  Report to [facility] Admitting at [phone].  Call this number if you have problems the morning of surgery:  [phone]        Remember:  Do not eat food or drink liquids after midnight.  Take these medicines the morning of surgery with A SIP OF WATER ALprazolam (Xanax) if needed, carvedilol (Coreg), Levothyroxine (Synthroid). Oxycodone (Percocet) if needed   Stop taking Aspirin, Ibuprofen, BC's, Goody's, Herbal medications, Fish Oil   Do not wear jewelry, make-up or nail polish.  Do not wear lotions, powders, or perfumes.  You may wear deodorant.  Do not shave 48 hours prior to surgery.  Men may shave face and neck.  Do not bring valuables to the hospital.  [facility] is not responsible for any belongings or valuables.  Contacts, dentures or bridgework may not be worn into surgery.  Leave your suitcase in the car.  After surgery it may be brought to your room.  For patients admitted to the hospital, discharge time will be determined by your treatment team.  Patients discharged the day of surgery will not be allowed to drive home.    Special instructions: [place] - Preparing for Surgery  Before surgery, you can play an important role.  Because skin is not sterile, your skin needs to be as free of germs as possible.  You can reduce the number of germs on you skin by washing with CHG (chlorahexidine gluconate) soap before surgery.  CHG is an antiseptic cleaner which kills germs and bonds with the skin to continue killing germs even after washing.  Please DO NOT use if you have an allergy to CHG or antibacterial soaps.  If your skin becomes reddened/irritated stop using the CHG and inform your nurse when you  arrive at Short Stay.  Do not shave (including legs and underarms) for at least 48 hours prior to the first CHG shower.  You may shave your face.  Please follow these instructions carefully:   1.  Shower with CHG Soap the night before surgery and the   morning of Surgery.  2.  If you choose to wash your hair, wash your hair first as usual with your   normal shampoo.  3.  After you shampoo, rinse your hair and body thoroughly to remove the     Shampoo.  4.  Use CHG as you would any other liquid soap.  You can apply chg directly  to the skin and wash gently with scrungie or a clean washcloth.  5.  Apply the CHG Soap to your body ONLY FROM THE NECK DOWN.    Do not use on open wounds or open sores.  Avoid contact with your eyes,   ears, mouth and genitals (private parts).  Wash genitals (private parts)       with your normal soap.  6.  Wash thoroughly, paying special attention to the area where your surgery  will be performed.  7.  Thoroughly rinse your body with warm water from the neck down.  8.  DO NOT shower/wash with your normal soap after using and rinsing off  the CHG Soap.  9.  Pat yourself dry with a clean towel.  10.  Wear clean pajamas.            11.  Place clean sheets on your bed the night of your first shower and do not  sleep with pets.  Day of Surgery  Do not apply any lotions/deoderants the morning of surgery.  Please wear clean clothes to the hospital/surgery center.      Please read over the following fact sheets that you were given. Pain Booklet, Coughing and Deep Breathing and Surgical Site Infection Prevention

## 2014-12-21 NOTE — H&P (Signed)
  History of Present Illness (HPI) Suffered degloving injury soft tissue left foot as well as malleolar fracture. Soft tissue flap approximated on admission 09/2014 by Dr. August Saucer and this went on to develop full thickness necrosis. She underwent debridement of this and eventual Integra placement. Patient experienced STEMI post injury dx takatsubo syndrome.   12/03/2014 Current wound care calcium alginate. Still living in SNF. Returns today with not much contraction. Plan skin graft.     Objective  Constitutional Vitals Time Taken: 2:20 PM, Height: 67 in, Source: Stated, Weight: 145 lbs, Source: Stated, BMI: 22.7, Temperature: 98.1 F, Pulse: 82 bpm, Respiratory Rate: 20 breaths/min, Blood Pressure: 106/56 mmHg.   CV: normal heart rate, regular rhythm PULM: clear to auscutation GEN: alert, oriented  Integumentary (Hair, Skin) Left,Dorsal Foot. The wound measures 13.8cm length x 10cm width x 0.1cm depth; There is no tunneling or undermining noted. There is a medium amount of serosanguineous drainage noted. The wound margin is flat and intact. There is large (67-100%) red granulation within the wound bed. There is no necrotic tissue within the wound bed. The periwound skin appearance had no abnormalities noted for moisture or color.  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 Plan skin graft split thickness from left thigh donor site. Plan overnight observation post op. Will need wound VAC post op and instructions given to her SNF. Reviewed pain donor site, risks failure graft, need for prolonged wound care, risks of anesthesia, cardiopulmonary complications. Questions answered. Possible A Cell to left thigh donor site to aid with healing of this site, counseled approximately 10 days for donor site healing.  Glenna Fellows, MD [facility] Plastic &  Reconstructive Surgery [phone]

## 2014-12-24 MED ORDER — CHLORHEXIDINE GLUCONATE 4 % EX LIQD: 1.0000 "application " | Freq: Once | CUTANEOUS | Status: DC

## 2014-12-24 MED ORDER — CIPROFLOXACIN IN D5W 400 MG/200ML IV SOLN
400.0000 mg | INTRAVENOUS | Status: DC
  Filled 2014-12-24: qty 200

## 2014-12-24 MED ORDER — CLINDAMYCIN PHOSPHATE 600 MG/50ML IV SOLN
600.0000 mg | INTRAVENOUS | Status: DC
  Filled 2014-12-24: qty 50

## 2014-12-25 ENCOUNTER — Ambulatory Visit (HOSPITAL_COMMUNITY)
Admission: RE | Admit: 2014-12-25 | Discharge: 2014-12-25 | Disposition: A | Discharge status: Skilled Nursing Facility | Payer: Medicare Other | Source: Ambulatory Visit | Attending: Plastic Surgery | Admitting: Plastic Surgery

## 2014-12-25 ENCOUNTER — Encounter (HOSPITAL_COMMUNITY)
Admission: RE | Disposition: A | Discharge status: Skilled Nursing Facility | Payer: Self-pay | Source: Ambulatory Visit | Attending: Plastic Surgery

## 2014-12-25 DIAGNOSIS — L97529 Non-pressure chronic ulcer of other part of left foot with unspecified severity: Secondary | ICD-10-CM | POA: Insufficient documentation

## 2014-12-25 DIAGNOSIS — Z538 Procedure and treatment not carried out for other reasons: Secondary | ICD-10-CM | POA: Diagnosis not present

## 2014-12-25 DIAGNOSIS — R21 Rash and other nonspecific skin eruption: Secondary | ICD-10-CM | POA: Insufficient documentation

## 2014-12-25 SURGERY — APPLICATION, GRAFT, SKIN, SPLIT-THICKNESS
Anesthesia: General | Laterality: Left

## 2014-12-25 MED ORDER — LACTATED RINGERS IV SOLN
INTRAVENOUS | Status: DC
  Administered 2014-12-25: 11:00:00 via INTRAVENOUS

## 2014-12-25 NOTE — Anesthesia Preprocedure Evaluation (Deleted)
Anesthesia Evaluation    Airway        Dental   Pulmonary COPD, former smoker (quit 2012),           Cardiovascular hypertension, Pt. on medications and Pt. on home beta blockers + Past MI (Takotsubo)    11/2014 ECHO: EF 45-50%. There is hypokinesis of the mid and apical inferior walls. 09/2014 cath: EF 30-35%   Neuro/Psych    GI/Hepatic   Endo/Other  Hypothyroidism   Renal/GU      Musculoskeletal  (+) Arthritis ,   Abdominal   Peds  Hematology  (+) Blood dyscrasia (Hb 10.4), ,   Anesthesia Other Findings   Reproductive/Obstetrics                            Anesthesia Physical Anesthesia Plan Anesthesia Quick Evaluation

## 2014-12-25 NOTE — Progress Notes (Signed)
Pt c/o itching over entire body.  Red raised welps visible over trunk, back, arms and legs.  She is unsure what has caused this but states this has been happening at her nursing facility as well and has several medications ordered for this.  She has not had anything for itching since yesterday as stated by her caregiver at the home. Dr Jean Rosenthal notified but felt Dr Leta Baptist should cover her with medications or assess skin.  Called Dr Leta Baptist but am not able to reach her.  Call back number left on her pager.

## 2014-12-25 NOTE — Interval H&P Note (Deleted)
History and Physical Interval Note:  12/25/2014 10:18 AM  Charlene Vincent  has presented today for surgery, with the diagnosis of NON PRESSURE ULCER LEFT FOOT  The various methods of treatment have been discussed with the patient and family. After consideration of risks, benefits and other options for treatment, the patient has consented to  Procedure(s): SKIN GRAFT SPLIT THICKNESS FROM LEFT THIGH TO LEFT FOOT APPLICATION OF ACELL TO LEFT THIGH (Left) APPLICATION OF A-CELL OF EXTREMITY LEFT THIGH (Left) as a surgical intervention .  The patient's history has been reviewed, patient examined, no change in status, stable for surgery.  I have reviewed the patient's chart and labs.  Questions were answered to the patient's satisfaction.     [name]

## 2014-12-25 NOTE — Progress Notes (Signed)
Transportation called to pick up pt.  Pt dressed, up in wheelchair and given snack and water. [facility] at [facility] called and informed of surgery cancellation due to rash and new order being sent with pt re: dressing changes. Unable to reach husband and son as they have left the hospital and do not have a working cell phone number.  When they return we will inform them of patients location and cancellation.

## 2014-12-25 NOTE — Interval H&P Note (Signed)
History and Physical Interval Note:  12/25/2014 12:07 PM Patient presents with rash and c/o itching over near entire body, patient unable to tell me how long this has been going on for, per one note sent with patient at least 2 weeks.  Has rash over Left leg and left thigh donor site.  Cancel surgery for today. Counseled patient even if her foot is not bothering her with itching, the donor site is involved in rash. Communicated with physician at [facility]. Wrote new wound care orders to switch to calcium alginate three time per week.

## 2015-01-07 ENCOUNTER — Encounter (HOSPITAL_BASED_OUTPATIENT_CLINIC_OR_DEPARTMENT_OTHER): Payer: Medicare Other | Attending: Plastic Surgery

## 2015-01-07 DIAGNOSIS — R21 Rash and other nonspecific skin eruption: Secondary | ICD-10-CM | POA: Insufficient documentation

## 2015-01-07 DIAGNOSIS — I2510 Atherosclerotic heart disease of native coronary artery without angina pectoris: Secondary | ICD-10-CM | POA: Insufficient documentation

## 2015-01-07 DIAGNOSIS — I252 Old myocardial infarction: Secondary | ICD-10-CM | POA: Insufficient documentation

## 2015-01-07 DIAGNOSIS — I5181 Takotsubo syndrome: Secondary | ICD-10-CM | POA: Insufficient documentation

## 2015-01-07 DIAGNOSIS — L97521 Non-pressure chronic ulcer of other part of left foot limited to breakdown of skin: Secondary | ICD-10-CM | POA: Insufficient documentation

## 2015-01-07 DIAGNOSIS — I110 Hypertensive heart disease with heart failure: Secondary | ICD-10-CM | POA: Diagnosis not present

## 2015-01-07 DIAGNOSIS — I509 Heart failure, unspecified: Secondary | ICD-10-CM | POA: Insufficient documentation

## 2015-01-07 DIAGNOSIS — M1990 Unspecified osteoarthritis, unspecified site: Secondary | ICD-10-CM | POA: Diagnosis not present

## 2015-01-07 DIAGNOSIS — I739 Peripheral vascular disease, unspecified: Secondary | ICD-10-CM | POA: Diagnosis not present

## 2015-01-18 MED ORDER — MEPERIDINE HCL 25 MG/ML IJ SOLN: 6.2500 mg | INTRAMUSCULAR | Status: DC | PRN

## 2015-01-18 MED ORDER — HYDROMORPHONE HCL 1 MG/ML IJ SOLN: 0.2500 mg | INTRAMUSCULAR | Status: DC | PRN

## 2015-01-18 MED ORDER — MIDAZOLAM HCL 2 MG/2ML IJ SOLN: 0.5000 mg | Freq: Once | INTRAMUSCULAR | Status: DC | PRN

## 2015-01-18 MED ORDER — PROMETHAZINE HCL 25 MG/ML IJ SOLN: 6.2500 mg | INTRAMUSCULAR | Status: DC | PRN

## 2015-01-21 ENCOUNTER — Encounter (HOSPITAL_COMMUNITY): Payer: Self-pay | Admitting: *Deleted

## 2015-01-21 NOTE — Progress Notes (Signed)
I spoke with [name], patients nurse at [facility] in [place].  I gave [name] instructions on NPO, Medications to take in am and to call OR desk after 0800.  I also called patients home , Charlene Vincent answered, The family were aware of surgery and have a time to arrive at the hospital

## 2015-01-22 ENCOUNTER — Ambulatory Visit (HOSPITAL_COMMUNITY): Payer: Medicare Other | Admitting: Anesthesiology

## 2015-01-22 ENCOUNTER — Encounter (HOSPITAL_COMMUNITY)
Admission: RE | Disposition: A | Discharge status: Skilled Nursing Facility | Payer: Self-pay | Source: Ambulatory Visit | Attending: Plastic Surgery

## 2015-01-22 ENCOUNTER — Observation Stay (HOSPITAL_COMMUNITY)
Admission: RE | Admit: 2015-01-22 | Discharge: 2015-01-23 | Disposition: A | Discharge status: Skilled Nursing Facility | Payer: Medicare Other | Source: Ambulatory Visit | Attending: Plastic Surgery | Admitting: Plastic Surgery

## 2015-01-22 ENCOUNTER — Encounter (HOSPITAL_COMMUNITY): Payer: Self-pay | Admitting: *Deleted

## 2015-01-22 DIAGNOSIS — T8189XA Other complications of procedures, not elsewhere classified, initial encounter: Secondary | ICD-10-CM | POA: Diagnosis present

## 2015-01-22 DIAGNOSIS — Y838 Other surgical procedures as the cause of abnormal reaction of the patient, or of later complication, without mention of misadventure at the time of the procedure: Secondary | ICD-10-CM | POA: Diagnosis not present

## 2015-01-22 DIAGNOSIS — S91309A Unspecified open wound, unspecified foot, initial encounter: Secondary | ICD-10-CM | POA: Diagnosis present

## 2015-01-22 DIAGNOSIS — Z886 Allergy status to analgesic agent status: Secondary | ICD-10-CM | POA: Insufficient documentation

## 2015-01-22 DIAGNOSIS — I96 Gangrene, not elsewhere classified: Secondary | ICD-10-CM | POA: Diagnosis not present

## 2015-01-22 DIAGNOSIS — Z87891 Personal history of nicotine dependence: Secondary | ICD-10-CM | POA: Insufficient documentation

## 2015-01-22 DIAGNOSIS — I509 Heart failure, unspecified: Secondary | ICD-10-CM | POA: Insufficient documentation

## 2015-01-22 DIAGNOSIS — Z880 Allergy status to penicillin: Secondary | ICD-10-CM | POA: Insufficient documentation

## 2015-01-22 DIAGNOSIS — E039 Hypothyroidism, unspecified: Secondary | ICD-10-CM | POA: Insufficient documentation

## 2015-01-22 DIAGNOSIS — I110 Hypertensive heart disease with heart failure: Secondary | ICD-10-CM | POA: Insufficient documentation

## 2015-01-22 HISTORY — PX: SKIN SPLIT GRAFT: SHX444

## 2015-01-22 HISTORY — DX: Essential tremor: G25.0

## 2015-01-22 HISTORY — PX: SKIN GRAFT: SHX250

## 2015-01-22 HISTORY — PX: APPLICATION OF A-CELL OF EXTREMITY: SHX6303

## 2015-01-22 LAB — CBC
HCT: 31.7 % — ABNORMAL LOW (ref 36.0–46.0)
HEMOGLOBIN: 10.2 g/dL — AB (ref 12.0–15.0)
MCH: 29.6 pg (ref 26.0–34.0)
MCHC: 32.2 g/dL (ref 30.0–36.0)
MCV: 91.9 fL (ref 78.0–100.0)
Platelets: 166 10*3/uL (ref 150–400)
RBC: 3.45 MIL/uL — ABNORMAL LOW (ref 3.87–5.11)
RDW: 18.2 % — AB (ref 11.5–15.5)
WBC: 7.9 10*3/uL (ref 4.0–10.5)

## 2015-01-22 LAB — BASIC METABOLIC PANEL
ANION GAP: 6 (ref 5–15)
BUN: 11 mg/dL (ref 6–20)
CALCIUM: 8.5 mg/dL — AB (ref 8.9–10.3)
CO2: 26 mmol/L (ref 22–32)
Chloride: 102 mmol/L (ref 101–111)
Creatinine, Ser: 0.75 mg/dL (ref 0.44–1.00)
GFR calc Af Amer: >60 mL/min (ref >60)
GLUCOSE: 93 mg/dL (ref 65–99)
POTASSIUM: 4 mmol/L (ref 3.5–5.1)
SODIUM: 134 mmol/L — AB (ref 135–145)

## 2015-01-22 LAB — SURGICAL PCR SCREEN
MRSA, PCR: POSITIVE — AB
STAPHYLOCOCCUS AUREUS: POSITIVE — AB

## 2015-01-22 SURGERY — APPLICATION, GRAFT, SKIN, SPLIT-THICKNESS
Anesthesia: General | Site: Leg Upper | Laterality: Left

## 2015-01-22 MED ORDER — FENTANYL CITRATE (PF) 250 MCG/5ML IJ SOLN
INTRAMUSCULAR | Status: AC
  Filled 2015-01-22: qty 5

## 2015-01-22 MED ORDER — MINERAL OIL LIGHT 100 % EX OIL
TOPICAL_OIL | CUTANEOUS | Status: AC
  Filled 2015-01-22: qty 25

## 2015-01-22 MED ORDER — MIDAZOLAM HCL 2 MG/2ML IJ SOLN
INTRAMUSCULAR | Status: AC
  Filled 2015-01-22: qty 2

## 2015-01-22 MED ORDER — KCL IN DEXTROSE-NACL 20-5-0.45 MEQ/L-%-% IV SOLN
INTRAVENOUS | Status: DC
  Administered 2015-01-22: 18:00:00 via INTRAVENOUS
  Filled 2015-01-22: qty 1000

## 2015-01-22 MED ORDER — MUPIROCIN 2 % EX OINT
TOPICAL_OINTMENT | CUTANEOUS | Status: AC
  Filled 2015-01-22: qty 22

## 2015-01-22 MED ORDER — LISINOPRIL 5 MG PO TABS
2.5000 mg | ORAL_TABLET | Freq: Every day | ORAL | Status: DC
  Administered 2015-01-22 – 2015-01-23 (×2): 2.5 mg via ORAL
  Filled 2015-01-22 (×2): qty 1

## 2015-01-22 MED ORDER — ONDANSETRON 4 MG PO TBDP: 4.0000 mg | ORAL_TABLET | Freq: Four times a day (QID) | ORAL | Status: DC | PRN

## 2015-01-22 MED ORDER — ALPRAZOLAM 0.25 MG PO TABS: 0.2500 mg | ORAL_TABLET | Freq: Two times a day (BID) | ORAL | Status: DC | PRN

## 2015-01-22 MED ORDER — LIDOCAINE HCL (CARDIAC) 10 MG/ML IV SOLN
INTRAVENOUS | Status: DC | PRN
  Administered 2015-01-22: 60 mg via INTRAVENOUS

## 2015-01-22 MED ORDER — PRO-STAT SUGAR FREE PO LIQD
30.0000 mL | Freq: Three times a day (TID) | ORAL | Status: DC
  Administered 2015-01-22: 30 mL via ORAL
  Filled 2015-01-22 (×2): qty 30

## 2015-01-22 MED ORDER — DEXAMETHASONE SODIUM PHOSPHATE 4 MG/ML IJ SOLN
INTRAMUSCULAR | Status: AC
  Filled 2015-01-22: qty 1

## 2015-01-22 MED ORDER — GLYCOPYRROLATE 0.2 MG/ML IJ SOLN
INTRAMUSCULAR | Status: DC | PRN
  Administered 2015-01-22 (×2): 0.1 mg via INTRAVENOUS

## 2015-01-22 MED ORDER — FENTANYL CITRATE (PF) 250 MCG/5ML IJ SOLN
INTRAMUSCULAR | Status: DC | PRN
  Administered 2015-01-22 (×3): 25 ug via INTRAVENOUS

## 2015-01-22 MED ORDER — ONDANSETRON HCL 4 MG/2ML IJ SOLN
INTRAMUSCULAR | Status: AC
  Filled 2015-01-22: qty 2

## 2015-01-22 MED ORDER — MUPIROCIN 2 % EX OINT
1.0000 | TOPICAL_OINTMENT | Freq: Two times a day (BID) | CUTANEOUS | Status: DC
Start: 2015-01-22 — End: 2015-01-23
  Administered 2015-01-22 – 2015-01-23 (×2): 1 via NASAL
  Filled 2015-01-22: qty 22

## 2015-01-22 MED ORDER — CLINDAMYCIN PHOSPHATE 600 MG/50ML IV SOLN
600.0000 mg | Freq: Once | INTRAVENOUS | Status: AC
  Administered 2015-01-22: 600 mg via INTRAVENOUS
  Filled 2015-01-22: qty 50

## 2015-01-22 MED ORDER — DIPHENHYDRAMINE HCL 25 MG PO TABS
25.0000 mg | ORAL_TABLET | Freq: Four times a day (QID) | ORAL | Status: DC | PRN
  Filled 2015-01-22: qty 1

## 2015-01-22 MED ORDER — CLINDAMYCIN PHOSPHATE 600 MG/50ML IV SOLN
600.0000 mg | Freq: Three times a day (TID) | INTRAVENOUS | Status: DC
Start: 2015-01-22 — End: 2015-01-23
  Administered 2015-01-22 – 2015-01-23 (×2): 600 mg via INTRAVENOUS
  Filled 2015-01-22 (×3): qty 50

## 2015-01-22 MED ORDER — DEXAMETHASONE SODIUM PHOSPHATE 4 MG/ML IJ SOLN
INTRAMUSCULAR | Status: DC | PRN
  Administered 2015-01-22: 4 mg via INTRAVENOUS

## 2015-01-22 MED ORDER — ONDANSETRON HCL 4 MG/2ML IJ SOLN
INTRAMUSCULAR | Status: DC | PRN
  Administered 2015-01-22: 4 mg via INTRAVENOUS

## 2015-01-22 MED ORDER — ASPIRIN EC 81 MG PO TBEC
81.0000 mg | DELAYED_RELEASE_TABLET | Freq: Every day | ORAL | Status: DC
  Administered 2015-01-23: 81 mg via ORAL
  Filled 2015-01-22: qty 1

## 2015-01-22 MED ORDER — DEXTROSE 5 % IV SOLN
10.0000 mg | INTRAVENOUS | Status: DC | PRN
  Administered 2015-01-22: 25 ug/min via INTRAVENOUS

## 2015-01-22 MED ORDER — MUPIROCIN 2 % EX OINT
1.0000 "application " | TOPICAL_OINTMENT | Freq: Once | CUTANEOUS | Status: AC
  Administered 2015-01-22: 1 via TOPICAL

## 2015-01-22 MED ORDER — FERROUS SULFATE 325 (65 FE) MG PO TABS
325.0000 mg | ORAL_TABLET | Freq: Three times a day (TID) | ORAL | Status: DC
  Administered 2015-01-22 – 2015-01-23 (×2): 325 mg via ORAL
  Filled 2015-01-22 (×2): qty 1

## 2015-01-22 MED ORDER — 0.9 % SODIUM CHLORIDE (POUR BTL) OPTIME
TOPICAL | Status: DC | PRN
  Administered 2015-01-22: 1000 mL

## 2015-01-22 MED ORDER — CALCIUM CARBONATE ANTACID 500 MG PO CHEW
2.0000 | CHEWABLE_TABLET | Freq: Three times a day (TID) | ORAL | Status: DC
  Administered 2015-01-22 – 2015-01-23 (×2): 400 mg via ORAL
  Filled 2015-01-22 (×2): qty 2

## 2015-01-22 MED ORDER — PROPOFOL 10 MG/ML IV BOLUS
INTRAVENOUS | Status: DC | PRN
  Administered 2015-01-22 (×2): 10 mg via INTRAVENOUS
  Administered 2015-01-22: 90 mg via INTRAVENOUS
  Administered 2015-01-22 (×2): 10 mg via INTRAVENOUS

## 2015-01-22 MED ORDER — ATORVASTATIN CALCIUM 80 MG PO TABS
80.0000 mg | ORAL_TABLET | Freq: Every day | ORAL | Status: DC
  Administered 2015-01-22: 80 mg via ORAL
  Filled 2015-01-22: qty 1

## 2015-01-22 MED ORDER — CHLORHEXIDINE GLUCONATE 4 % EX LIQD: 1.0000 "application " | Freq: Once | CUTANEOUS | Status: DC

## 2015-01-22 MED ORDER — LEVOTHYROXINE SODIUM 50 MCG PO TABS
75.0000 ug | ORAL_TABLET | Freq: Every day | ORAL | Status: DC
  Administered 2015-01-23: 75 ug via ORAL
  Filled 2015-01-22: qty 1

## 2015-01-22 MED ORDER — MORPHINE SULFATE (PF) 2 MG/ML IV SOLN: 1.0000 mg | INTRAVENOUS | Status: DC | PRN

## 2015-01-22 MED ORDER — LACTATED RINGERS IV SOLN
INTRAVENOUS | Status: DC
  Administered 2015-01-22: 11:00:00 via INTRAVENOUS

## 2015-01-22 MED ORDER — OXYCODONE-ACETAMINOPHEN 5-325 MG PO TABS
1.0000 | ORAL_TABLET | Freq: Four times a day (QID) | ORAL | Status: DC | PRN
  Administered 2015-01-22: 1 via ORAL
  Filled 2015-01-22: qty 1

## 2015-01-22 MED ORDER — CARVEDILOL 3.125 MG PO TABS
3.1250 mg | ORAL_TABLET | Freq: Two times a day (BID) | ORAL | Status: DC
  Administered 2015-01-22 – 2015-01-23 (×2): 3.125 mg via ORAL
  Filled 2015-01-22 (×2): qty 1

## 2015-01-22 MED ORDER — HYDROMORPHONE HCL 1 MG/ML IJ SOLN: 0.2500 mg | INTRAMUSCULAR | Status: DC | PRN

## 2015-01-22 MED ORDER — POTASSIUM CHLORIDE CRYS ER 10 MEQ PO TBCR
10.0000 meq | EXTENDED_RELEASE_TABLET | Freq: Every day | ORAL | Status: DC
  Administered 2015-01-23: 10 meq via ORAL
  Filled 2015-01-22: qty 1

## 2015-01-22 MED ORDER — ONDANSETRON HCL 4 MG/2ML IJ SOLN: 4.0000 mg | Freq: Four times a day (QID) | INTRAMUSCULAR | Status: DC | PRN

## 2015-01-22 MED ORDER — BUPIVACAINE HCL (PF) 0.25 % IJ SOLN
INTRAMUSCULAR | Status: AC
  Filled 2015-01-22: qty 30

## 2015-01-22 MED ORDER — VITAMIN C 500 MG PO TABS
500.0000 mg | ORAL_TABLET | Freq: Two times a day (BID) | ORAL | Status: DC
  Administered 2015-01-22 – 2015-01-23 (×2): 500 mg via ORAL
  Filled 2015-01-22 (×2): qty 1

## 2015-01-22 MED ORDER — HYDROXYZINE HCL 10 MG PO TABS
10.0000 mg | ORAL_TABLET | Freq: Four times a day (QID) | ORAL | Status: DC | PRN
  Filled 2015-01-22: qty 1

## 2015-01-22 MED ORDER — PROMETHAZINE HCL 25 MG/ML IJ SOLN: 6.2500 mg | INTRAMUSCULAR | Status: DC | PRN

## 2015-01-22 MED ORDER — CHLORHEXIDINE GLUCONATE CLOTH 2 % EX PADS
6.0000 | MEDICATED_PAD | Freq: Every day | CUTANEOUS | Status: DC
  Administered 2015-01-23: 6 via TOPICAL

## 2015-01-22 SURGICAL SUPPLY — 66 items
BAG DECANTER FOR FLEXI CONT (MISCELLANEOUS) IMPLANT
BANDAGE ELASTIC 3 VELCRO ST LF (GAUZE/BANDAGES/DRESSINGS) IMPLANT
BANDAGE ELASTIC 4 VELCRO ST LF (GAUZE/BANDAGES/DRESSINGS) ×4 IMPLANT
BANDAGE ELASTIC 6 VELCRO ST LF (GAUZE/BANDAGES/DRESSINGS) IMPLANT
BLADE 10 SAFETY STRL DISP (BLADE) ×4 IMPLANT
BLADE DERMATOME II (BLADE) ×4 IMPLANT
BLADE SURG 10 STRL SS (BLADE) ×4 IMPLANT
BLADE SURG 15 STRL LF DISP TIS (BLADE) ×2 IMPLANT
BLADE SURG 15 STRL SS (BLADE) ×2
BLADE SURG ROTATE 9660 (MISCELLANEOUS) IMPLANT
BNDG GAUZE ELAST 4 BULKY (GAUZE/BANDAGES/DRESSINGS) ×4 IMPLANT
CANISTER SUCTION 2500CC (MISCELLANEOUS) IMPLANT
CANISTER WOUND CARE 500ML ATS (WOUND CARE) IMPLANT
COVER SURGICAL LIGHT HANDLE (MISCELLANEOUS) ×4 IMPLANT
DERMACARRIERS GRAFT 1 TO 1.5 (DISPOSABLE) ×4
DRAPE EXTREMITY T 121X128X90 (DRAPE) ×4 IMPLANT
DRAPE INCISE IOBAN 66X45 STRL (DRAPES) IMPLANT
DRAPE ORTHO SPLIT 77X108 STRL (DRAPES) ×4
DRAPE PROXIMA HALF (DRAPES) ×4 IMPLANT
DRAPE SURG ORHT 6 SPLT 77X108 (DRAPES) ×4 IMPLANT
DRSG ADAPTIC 3X8 NADH LF (GAUZE/BANDAGES/DRESSINGS) ×4 IMPLANT
DRSG PAD ABDOMINAL 8X10 ST (GAUZE/BANDAGES/DRESSINGS) ×8 IMPLANT
DRSG VAC ATS MED SENSATRAC (GAUZE/BANDAGES/DRESSINGS) IMPLANT
DRSG VAC ATS SM SENSATRAC (GAUZE/BANDAGES/DRESSINGS) IMPLANT
ELECT COATED BLADE 2.86 ST (ELECTRODE) ×4 IMPLANT
ELECT REM PT RETURN 9FT ADLT (ELECTROSURGICAL) ×4
ELECTRODE REM PT RTRN 9FT ADLT (ELECTROSURGICAL) ×2 IMPLANT
GAUZE SPONGE 4X4 12PLY STRL (GAUZE/BANDAGES/DRESSINGS) ×4 IMPLANT
GEL ULTRASOUND 20GR AQUASONIC (MISCELLANEOUS) ×4 IMPLANT
GLOVE BIO SURGEON STRL SZ 6 (GLOVE) ×4 IMPLANT
GLOVE SURG SS PI 6.0 STRL IVOR (GLOVE) ×4 IMPLANT
GOWN STRL REUS W/ TWL LRG LVL3 (GOWN DISPOSABLE) ×4 IMPLANT
GOWN STRL REUS W/TWL LRG LVL3 (GOWN DISPOSABLE) ×4
GRAFT DERMACARRIERS 1 TO 1.5 (DISPOSABLE) ×2 IMPLANT
KIT BASIN OR (CUSTOM PROCEDURE TRAY) ×4 IMPLANT
KIT ROOM TURNOVER OR (KITS) ×4 IMPLANT
MATRIX SURGICAL PSM 7X10CM (Tissue) ×4 IMPLANT
NS IRRIG 1000ML POUR BTL (IV SOLUTION) ×4 IMPLANT
PACK GENERAL/GYN (CUSTOM PROCEDURE TRAY) ×4 IMPLANT
PACK SURGICAL SETUP 50X90 (CUSTOM PROCEDURE TRAY) ×4 IMPLANT
PAD ARMBOARD 7.5X6 YLW CONV (MISCELLANEOUS) ×8 IMPLANT
PAD CAST 3X4 CTTN HI CHSV (CAST SUPPLIES) IMPLANT
PAD CAST 4YDX4 CTTN HI CHSV (CAST SUPPLIES) IMPLANT
PADDING CAST COTTON 3X4 STRL (CAST SUPPLIES)
PADDING CAST COTTON 4X4 STRL (CAST SUPPLIES)
PADDING CAST COTTON 6X4 STRL (CAST SUPPLIES) IMPLANT
PENCIL BUTTON HOLSTER BLD 10FT (ELECTRODE) IMPLANT
SPONGE LAP 18X18 X RAY DECT (DISPOSABLE) ×4 IMPLANT
STAPLER VISISTAT 35W (STAPLE) ×4 IMPLANT
SURGILUBE 2OZ TUBE FLIPTOP (MISCELLANEOUS) IMPLANT
SUT CHROMIC 4 0 P 3 18 (SUTURE) ×4 IMPLANT
SUT CHROMIC 4 0 PS 2 18 (SUTURE) ×16 IMPLANT
SUT MNCRL AB 4-0 PS2 18 (SUTURE) IMPLANT
SUT VIC AB 3-0 FS2 27 (SUTURE) IMPLANT
SUT VIC AB 5-0 P-3 18XBRD (SUTURE) IMPLANT
SUT VIC AB 5-0 P3 18 (SUTURE)
SUT VIC AB 5-0 PS2 18 (SUTURE) IMPLANT
SUT VICRYL 4-0 PS2 18IN ABS (SUTURE) IMPLANT
SYR BULB IRRIGATION 50ML (SYRINGE) ×4 IMPLANT
SYR CONTROL 10ML LL (SYRINGE) ×4 IMPLANT
TOWEL OR 17X24 6PK STRL BLUE (TOWEL DISPOSABLE) ×4 IMPLANT
TOWEL OR 17X26 10 PK STRL BLUE (TOWEL DISPOSABLE) ×4 IMPLANT
TUBE CONNECTING 12'X1/4 (SUCTIONS)
TUBE CONNECTING 12X1/4 (SUCTIONS) IMPLANT
UNDERPAD 30X30 INCONTINENT (UNDERPADS AND DIAPERS) ×4 IMPLANT
YANKAUER SUCT BULB TIP NO VENT (SUCTIONS) ×4 IMPLANT

## 2015-01-22 NOTE — Anesthesia Preprocedure Evaluation (Signed)
Anesthesia Evaluation  Patient identified by MRN, date of birth, ID band Patient awake    Reviewed: Allergy & Precautions, NPO status , Patient's Chart, lab work & pertinent test results  Airway Mallampati: II  TM Distance: >3 FB Neck ROM: Full    Dental no notable dental hx.    Pulmonary neg pulmonary ROS, former smoker,    Pulmonary exam normal breath sounds clear to auscultation       Cardiovascular hypertension, +CHF  Normal cardiovascular exam Rhythm:Regular Rate:Normal     Neuro/Psych negative neurological ROS  negative psych ROS   GI/Hepatic negative GI ROS, Neg liver ROS,   Endo/Other  Hypothyroidism   Renal/GU negative Renal ROS  negative genitourinary   Musculoskeletal negative musculoskeletal ROS (+)   Abdominal   Peds negative pediatric ROS (+)  Hematology negative hematology ROS (+)   Anesthesia Other Findings   Reproductive/Obstetrics negative OB ROS                             Anesthesia Physical Anesthesia Plan  ASA: III  Anesthesia Plan: General   Post-op Pain Management:    Induction: Intravenous  Airway Management Planned: LMA  Additional Equipment:   Intra-op Plan:   Post-operative Plan: Extubation in OR  Informed Consent: I have reviewed the patients History and Physical, chart, labs and discussed the procedure including the risks, benefits and alternatives for the proposed anesthesia with the patient or authorized representative who has indicated his/her understanding and acceptance.   Dental advisory given  Plan Discussed with: CRNA and Surgeon  Anesthesia Plan Comments:         Anesthesia Quick Evaluation

## 2015-01-22 NOTE — Transfer of Care (Signed)
Immediate Anesthesia Transfer of Care Note  Patient: Charlene Vincent  Procedure(s) Performed: Procedure(s):  SPLIT THICKNESS SKIN GRAFT FROM LEFT THIGH TO LEFT FOOT  (Left) APPLICATION OF A-CELL TO LEFT THIGH  (Left)  Patient Location: PACU  Anesthesia Type:General  Level of Consciousness: awake and alert   Airway & Oxygen Therapy: Patient Spontanous Breathing and Patient connected to nasal cannula oxygen  Post-op Assessment: Report given to RN, Post -op Vital signs reviewed and stable and Patient moving all extremities X 4  Post vital signs: Reviewed and stable  Last Vitals:  Filed Vitals:   01/22/15 1003  BP: 112/48  Pulse: 95  Temp: 36.2 C  Resp: 18    Complications: No apparent anesthesia complications

## 2015-01-22 NOTE — H&P (Signed)
  History of Present Illness (HPI) Suffered degloving injury soft tissue left foot as well as malleolar fracture. Soft tissue flap approximated on admission 09/2014 and this went on to develop full thickness necrosis. She underwent debridement of this and eventual Integra placement. Patient experienced STEMI post injury dx takatsubo syndrome. She has been on local wound care in SNF with continued open wound. Plan skin graft to left foot. Last attempt at this had rash, she had course of steroids and this has resolved.   Objective  BP 112/48 mmHg  Pulse 95  Temp(Src) 97.1 F (36.2 C) (Oral)  Resp 18  Ht 5\' 7"  (1.702 m)  Wt 64.411 kg (142 lb)  BMI 22.24 kg/m2  SpO2 93%  CV: normal heart rate, regular rhythm PULM: clear to auscutation GEN: alert, oriented  Integumentary (Hair, Skin) Left,Dorsal Foot. The wound measures 13.m length x 10cm width x 0.1cm depth; There is no tunneling or undermining noted. There is a medium amount of serosanguineous drainage noted. The wound margin is flat and intact. 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 Plan skin graft split thickness from left thigh donor site. Plan overnight observation post op. Plan wound VAC post op. Reviewed pain donor site, risks failure graft, need for prolonged wound care, risks of anesthesia, cardiopulmonary complications. Possible A Cell to left thigh donor site to aid with healing of this site, counseled approximately 10 days for donor site healing.  Glenna Fellows, MD [facility] Plastic & Reconstructive Surgery [phone]

## 2015-01-22 NOTE — Op Note (Signed)
Operative Note   DATE OF OPERATION: 11.29.16  LOCATION: [facility] OR- observation  SURGICAL DIVISION: Plastic Surgery  PREOPERATIVE DIAGNOSES:  1. Open wound left foot 2. History degloving injury left foot  POSTOPERATIVE DIAGNOSES:  same  PROCEDURE:  1. Surgical preparation for grafting left foot 80 cm2 2. Split thickness skin graft to left foot 80 cm2 3. Application A Cell matrix to left thigh 70 cm2  SURGEON: Glenna Fellows MD MBA  ASSISTANT: none  ANESTHESIA:  General.   EBL: 50 ml  COMPLICATIONS: None immediate.   INDICATIONS FOR PROCEDURE:  The patient, Charlene Vincent, is a 80 y.o. female born on [birth year], is here for skin graft to chronic would left foot incurred from fall with degloving injury.   FINDINGS: granulated dorsal foot wound  DESCRIPTION OF PROCEDURE:  The patient's operative site was marked with the patient in the preoperative area. The patient was taken to the operating room. IV antibiotics were given. The patient's operative site was prepped and draped in a sterile fashion. A time out was performed and all information was confirmed to be correct. The left foot wound was prepared by curettage to remove slough and hypergranulation. It was thoroughly irrigated. Split thickness graft then harvested from left thigh at 12/1000th of inch and meshed to 1:1.5 ratio. The graft was inset over left foot wound with 4-0 chromic suture. The A Cell matrix was prepared and perforated and applied to left thigh donor site and tacked with interrupted 4-0 chromic. Adaptic was applied over grafted skin followed by VAC sponge and VAC set to 125 mm Hg continuous. Left thigh dressed with Adaptic over A Cell, followed by ABD pad, kerlix and Ace wrap.  The patient was allowed to wake from anesthesia, extubated and taken to the recovery room in satisfactory condition.   SPECIMENS: none  DRAINS: wound VAC  Glenna Fellows, MD [facility] Plastic & Reconstructive Surgery [phone]

## 2015-01-22 NOTE — Anesthesia Procedure Notes (Signed)
Procedure Name: LMA Insertion Date/Time: 01/22/2015 12:56 PM Performed by: Glo Herring B Pre-anesthesia Checklist: Patient identified, Timeout performed, Emergency Drugs available, Suction available and Patient being monitored Patient Re-evaluated:Patient Re-evaluated prior to inductionOxygen Delivery Method: Circle system utilized Preoxygenation: Pre-oxygenation with 100% oxygen Intubation Type: IV induction Ventilation: Mask ventilation without difficulty LMA: LMA inserted LMA Size: 4.0 Number of attempts: 1 Placement Confirmation: positive ETCO2 and breath sounds checked- equal and bilateral Tube secured with: Tape Dental Injury: Teeth and Oropharynx as per pre-operative assessment

## 2015-01-23 ENCOUNTER — Encounter (HOSPITAL_COMMUNITY): Payer: Self-pay | Admitting: Plastic Surgery

## 2015-01-23 DIAGNOSIS — T8189XA Other complications of procedures, not elsewhere classified, initial encounter: Secondary | ICD-10-CM | POA: Diagnosis not present

## 2015-01-23 NOTE — Discharge Summary (Signed)
Physician Discharge Summary  Patient ID: DICY SMIGEL MRN: [id number] DOB/AGE: August 30, 1934 80 y.o.  Admit date: 01/22/2015 Discharge date: 01/23/2015  Admission Diagnoses: Open wound left foot  Discharge Diagnoses:  Active Problems:   Open wound of foot except toe(s) alone   Discharged Condition: stable  Hospital Course: Admitted for observation post operatively given history MI. Patient with no complaints on POD#1. No dressing changes until f/u visit. Needs Orthopaedic follow up with Dr. August Saucer to determine weight bearing status.   Treatments: surgery: split thickness skin graft to left foot, application A Cell to left thigh donor site  Discharge Exam: Blood pressure 117/90, pulse 70, temperature 98.1 F (36.7 C), temperature source Oral, resp. rate 17, height  (1.702 m), weight 59.3 kg (130 lb 11.7 oz), SpO2 100 %. Incision/Wound: VAC in place no drainage, left thigh dressing intact  Disposition: 03-Skilled Nursing Facility  Discharge Instructions    Call MD for:  redness, tenderness, or signs of infection (pain, swelling, bleeding, redness, odor or green/yellow discharge around incision site)    Complete by:  As directed      Call MD for:  severe or increased pain, loss or decreased feeling  in affected limb(s)    Complete by:  As directed      Call MD for:  temperature >100.5    Complete by:  As directed      Discharge instructions    Complete by:  As directed   VAC to 125 mm Hg continuous, do not turn off or clamp. No dressing changes until MD visit.  Keep left thigh dressings dry, intact until follow up. May reinforce as needed for saturation. Do not change dressing unless it falls off.  Keep LLE elevated while sitting, in bed.  Sponge bathe until follow up visit.     Resume previous diet    Complete by:  As directed             Medication List    TAKE these medications        ALPRAZolam 0.25 MG tablet  Commonly known as:  XANAX  Take 1 tablet  (0.25 mg total) by mouth 2 (two) times daily as needed for anxiety.     aspirin EC 81 MG tablet  Take 81 mg by mouth daily.     atorvastatin 80 MG tablet  Commonly known as:  LIPITOR  Take 80 mg by mouth daily.     CALCIUM-VITAMIN D PO  Take 1 tablet by mouth daily.     carvedilol 3.125 MG tablet  Commonly known as:  COREG  Take 1 tablet (3.125 mg total) by mouth 2 (two) times daily with a meal.     diphenhydrAMINE 25 MG tablet  Commonly known as:  BENADRYL  Take 25 mg by mouth every 6 (six) hours as needed for itching.     doxepin 10 MG capsule  Commonly known as:  SINEQUAN  Take 10 mg by mouth daily as needed (itching).     feeding supplement (PRO-STAT SUGAR FREE 64) Liqd  Take 30 mLs by mouth 3 (three) times daily with meals.     ferrous sulfate 325 (65 FE) MG tablet  Take 325 mg by mouth 3 (three) times daily with meals.     hydrOXYzine 10 MG tablet  Commonly known as:  ATARAX/VISTARIL  Take 1 tablet by mouth every 6 (six) hours as needed for itching.     levothyroxine 75 MCG tablet  Commonly known as:  SYNTHROID,  LEVOTHROID  Take 75 mcg by mouth daily before breakfast.     lisinopril 2.5 MG tablet  Commonly known as:  PRINIVIL,ZESTRIL  Take 1 tablet (2.5 mg total) by mouth daily.     oxyCODONE-acetaminophen 5-325 MG tablet  Commonly known as:  PERCOCET/ROXICET  Take 1 tablet by mouth every 6 (six) hours as needed for moderate pain.     polyethylene glycol packet  Commonly known as:  MIRALAX / GLYCOLAX  Take 17 g by mouth daily.     potassium chloride 10 MEQ tablet  Commonly known as:  K-DUR,KLOR-CON  Take 10 mEq by mouth daily.     saccharomyces boulardii 250 MG capsule  Commonly known as:  FLORASTOR  Take 1 capsule (250 mg total) by mouth 2 (two) times daily.     vitamin C 500 MG tablet  Commonly known as:  ASCORBIC ACID  Take 500 mg by mouth 2 (two) times daily.           Follow-up Information    Follow up with Glenna Fellows, MD On  01/30/2015.   Specialty:  Plastic Surgery   Why:  11 30 am   Contact information:   [address] [place] [address] [phone]       Follow up with Cammy Copa, MD. Schedule an appointment as soon as possible for a visit in 2 weeks.   Specialty:  Orthopedic Surgery   Why:  ankle fracture follow up   Contact information:   [address] [place] [address] [phone]       Signed: Glenna Fellows 01/23/2015, 7:23 AM

## 2015-01-23 NOTE — Clinical Social Work Note (Signed)
Clinical Social Work Assessment  Patient Details  Name: Charlene Vincent MRN: [id number] Date of Birth: 1934-06-13  Date of referral:  01/23/15               Reason for consult:  Facility Placement                Permission sought to share information with:  Facility Industrial/product designer granted to share information::  Yes, Verbal Permission Granted  Name::        Agency::  SNF Admissions  Relationship::     Contact Information:     Housing/Transportation Living arrangements for the past 2 months:  Skilled Nursing Facility Source of Information:  Patient Patient Interpreter Needed:  None Criminal Activity/Legal Involvement Pertinent to Current Situation/Hospitalization:  No - Comment as needed Significant Relationships:  Adult Children, Spouse Lives with:  From Facility Do you feel safe going back to the place where you live?  Yes Need for family participation in patient care:  No (Coment)  Care giving concerns:  Patient is from [facility] in [place], patient plans to return back to facility.   Social Worker assessment / plan:  Patient is a pleasant 80 year old female who is from SNF.  Patient is alert and oriented x4 and able to make decisions, patient is talkative and married.  Patient's son and husband is at bedside.  Patient expressed she is ready for discharge back to [facility].  CSW spoke to facilty who said they can take her back.  Patient expressed she did not have any questions about discharging.  Patient and family agreeable to discharging to SNF.  Employment status:  Retired Database administrator PT Recommendations:  Not assessed at this time Information / Referral to community resources:     Patient/Family's Response to care:  Patient and family agreeable to discharging back to SNF.  Patient/Family's Understanding of and Emotional Response to Diagnosis, Current Treatment, and Prognosis:  Patient is aware of current treatment plan and  prognosis.  Emotional Assessment Appearance:  Appears stated age Attitude/Demeanor/Rapport:    Affect (typically observed):  Pleasant, Calm, Stable Orientation:  Oriented to Self, Oriented to Place, Oriented to  Time, Oriented to Situation Alcohol / Substance use:  Not Applicable Psych involvement (Current and /or in the community):  No (Comment)  Discharge Needs  Concerns to be addressed:  No discharge needs identified Readmission within the last 30 days:  No Current discharge risk:  None Barriers to Discharge:  No Barriers Identified   Darleene Cleaver, LCSWA 01/23/2015, 1:09 PM

## 2015-01-23 NOTE — Anesthesia Postprocedure Evaluation (Signed)
Anesthesia Post Note  Patient: Charlene Vincent  Procedure(s) Performed: Procedure(s) (LRB):  SPLIT THICKNESS SKIN GRAFT FROM LEFT THIGH TO LEFT FOOT  (Left) APPLICATION OF A-CELL TO LEFT THIGH  (Left)  Patient location during evaluation: PACU Anesthesia Type: General Level of consciousness: awake and alert Pain management: pain level controlled Vital Signs Assessment: post-procedure vital signs reviewed and stable Respiratory status: spontaneous breathing, nonlabored ventilation, respiratory function stable and patient connected to nasal cannula oxygen Cardiovascular status: blood pressure returned to baseline and stable Postop Assessment: no signs of nausea or vomiting Anesthetic complications: no    Last Vitals:  Filed Vitals:   01/23/15 0156 01/23/15 0506  BP: 112/60 117/90  Pulse: 87 70  Temp: 37 C 36.7 C  Resp: 18 17    Last Pain:  Filed Vitals:   01/23/15 1119  PainSc: 0-No pain                 [name] S

## 2015-01-23 NOTE — Clinical Social Work Note (Signed)
Patient to be d/c'ed today to [facility] in [place].  Patient and family agreeable to plans will transport via family's car.  RN to call report.  Windell Moulding, MSW, Theresia Majors [phone]

## 2015-03-14 ENCOUNTER — Encounter: Payer: Self-pay | Admitting: Physician Assistant

## 2015-03-14 ENCOUNTER — Ambulatory Visit (INDEPENDENT_AMBULATORY_CARE_PROVIDER_SITE_OTHER): Payer: Medicare Other | Admitting: Physician Assistant

## 2015-03-14 VITALS — BP 94/62 | HR 72 | Ht 67.0 in | Wt 108.7 lb

## 2015-03-14 DIAGNOSIS — I10 Essential (primary) hypertension: Secondary | ICD-10-CM | POA: Diagnosis not present

## 2015-03-14 DIAGNOSIS — E785 Hyperlipidemia, unspecified: Secondary | ICD-10-CM | POA: Diagnosis not present

## 2015-03-14 DIAGNOSIS — I959 Hypotension, unspecified: Secondary | ICD-10-CM

## 2015-03-14 NOTE — Patient Instructions (Signed)
Medication Instructions:  Your physician recommends that you continue on your current medications as directed. Please refer to the Current Medication list given to you today.   Labwork: Your physician recommends that you return for lab work in: TODAY (bmet0 The lab can be found on the FIRST FLOOR of out building in [address]   Testing/Procedures: none  Follow-Up: Your physician wants you to follow-up in: 3 months with Dr. Swaziland. You will receive a reminder letter in the mail two months in advance. If you don't receive a letter, please call our office to schedule the follow-up appointment.   Any Other Special Instructions Will Be Listed Below (If Applicable).     If you need a refill on your cardiac medications before your next appointment, please call your pharmacy.

## 2015-03-14 NOTE — Progress Notes (Signed)
Cardiology Office Note   Date:  03/14/2015   ID:  Charlene Vincent, DOB Nov 15, 1934, MRN [id number]  PCP:  Pearson Grippe, MD  Cardiologist:  Dr Swaziland  Barrett, Rhonda, PA-C   Chief Complaint  Patient presents with  . Follow-up    STEMI//had ECHO (12/06/14)//pt c/o occasionally dizziness after she takes her medications//no other Sx. or concerns.    History of Present Illness: Charlene Vincent is a 81 y.o. female with a history of HTN, anemia, foot injury, MI w/ Takotsubo CM 2012 & 09/2014, EF improved to 45-50% 11/2014; foot injury w/ skin grafting 12/2014  Charlene Vincent presents for evaluation of medication management  Charlene Vincent has not been tracking her weight at home. She has some chronic lower extremity edema. She is not sure she wakes up with it or not and her son who is with her today, cannot say for sure either. He says she is eating poorly and has lost weight in general. This is apparent by her appearance. She eats some good things but refuses many foods. The meat she is generally deli meat which is high in sodium. She eats other things that are high in sodium.  She has not had chest pain. She denies orthopnea or PND. She has no new dyspnea on exertion. Her activity level is poor and she does not do very much. She has not had palpitations. Her only symptom is getting lightheaded at times, generally an hour or 2 after she takes her medications. They do not have a blood pressure cuff and cannot check her pressure.   Past Medical History  Diagnosis Date  . Takotsubo cardiomyopathy     a. 2012  b. LHC in 09/2014 nl cors, EF 30-35%>>45-50% by echo 11/2014  . Hypothyroidism   . Anemia   . Costochondritis   . Arthritis   . Cellulitis   . Shingles   . Myocardial infarction [facility]) 2012, 2016    both Takotsubo  . Pneumonia   . Headache   . Tremor, essential     Past Surgical History  Procedure Laterality Date  . Inguinal hernia repair    . Cardiac catheterization   September 10, 2010    EF 35 to 40%. No significant CAD. ? takotsubo cardiomyopathy  . Cardiac catheterization N/A 10/02/2014    Procedure: Left Heart Cath and Coronary Angiography;  Surgeon: Peter M Swaziland, MD;  Location: [facility] INVASIVE CV LAB;  Service: Cardiovascular;  Laterality: N/A;  . I&d extremity Left 10/09/2014    Procedure: DEBRIDEMENT LEFT FOOT;  Surgeon: Cammy Copa, MD;  Location: [facility] OR;  Service: Orthopedics;  Laterality: Left;  . Application of wound vac Left 10/09/2014    Procedure: APPLICATION OF WOUND VAC;  Surgeon: Cammy Copa, MD;  Location: [facility] OR;  Service: Orthopedics;  Laterality: Left;  . Skin full thickness graft Left 10/12/2014    Procedure: Debridement left foot, application integra, wound vacuum-assisted closure;  Surgeon: Glenna Fellows, MD;  Location: [facility] OR;  Service: Plastics;  Laterality: Left;  . Abdominal hysterectomy    . Skin graft Left 01/22/2015    left thigh to left foot  . Skin split graft Left 01/22/2015    Procedure:  SPLIT THICKNESS SKIN GRAFT FROM LEFT THIGH TO LEFT FOOT ;  Surgeon: Glenna Fellows, MD;  Location: [facility] OR;  Service: Plastics;  Laterality: Left;  . Application of a-cell of extremity Left 01/22/2015    Procedure: APPLICATION OF A-CELL TO LEFT THIGH ;  Surgeon: Glenna Fellows, MD;  Location: [facility] OR;  Service: Plastics;  Laterality: Left;    Current Outpatient Prescriptions  Medication Sig Dispense Refill  . ALPRAZolam (XANAX) 0.25 MG tablet Take 1 tablet (0.25 mg total) by mouth 2 (two) times daily as needed for anxiety. 20 tablet 0  . Amino Acids-Protein Hydrolys (FEEDING SUPPLEMENT, PRO-STAT SUGAR FREE 64,) LIQD Take 30 mLs by mouth 3 (three) times daily with meals.    Marland Kitchen aspirin EC 81 MG tablet Take 81 mg by mouth daily.    Marland Kitchen atorvastatin (LIPITOR) 80 MG tablet Take 80 mg by mouth daily.    Marland Kitchen CALCIUM-VITAMIN D PO Take 1 tablet by mouth daily.    . carvedilol (COREG) 3.125 MG tablet Take 1 tablet (3.125 mg total) by mouth 2 (two)  times daily with a meal.    . diphenhydrAMINE (BENADRYL) 25 MG tablet Take 25 mg by mouth every 6 (six) hours as needed for itching.    Marland Kitchen doxepin (SINEQUAN) 10 MG capsule Take 10 mg by mouth daily as needed (itching).    . ferrous sulfate 325 (65 FE) MG tablet Take 325 mg by mouth 3 (three) times daily with meals.    . hydrOXYzine (ATARAX/VISTARIL) 10 MG tablet Take 1 tablet by mouth every 6 (six) hours as needed for itching.   0  . levothyroxine (SYNTHROID, LEVOTHROID) 75 MCG tablet Take 75 mcg by mouth daily before breakfast.    . lisinopril (PRINIVIL,ZESTRIL) 2.5 MG tablet Take 1 tablet (2.5 mg total) by mouth daily.    Marland Kitchen oxyCODONE-acetaminophen (PERCOCET/ROXICET) 5-325 MG per tablet Take 1 tablet by mouth every 6 (six) hours as needed for moderate pain. 30 tablet 0  . polyethylene glycol (MIRALAX / GLYCOLAX) packet Take 17 g by mouth daily.    . potassium chloride (K-DUR,KLOR-CON) 10 MEQ tablet Take 10 mEq by mouth daily.    Marland Kitchen saccharomyces boulardii (FLORASTOR) 250 MG capsule Take 1 capsule (250 mg total) by mouth 2 (two) times daily.    . vitamin C (ASCORBIC ACID) 500 MG tablet Take 500 mg by mouth 2 (two) times daily.     No current facility-administered medications for this visit.    Allergies:   Aspirin and Penicillins    Social History:  The patient  reports that she quit smoking about 4 years ago. Her smoking use included Cigarettes. She smoked 0.50 packs per day. She has never used smokeless tobacco. She reports that she does not drink alcohol or use illicit drugs.   Family History:  The patient's family history includes Heart disease in her brother and father.    ROS:  Please see the history of present illness. All other systems are reviewed and negative.    PHYSICAL EXAM: VS:  BP 94/62 mmHg  Pulse 72  Ht 5\' 7"  (1.702 m)  Wt 108 lb 11.2 oz (49.306 kg)  BMI 17.02 kg/m2 , BMI Body mass index is 17.02 kg/(m^2). GEN: Well nourished, well developed, female in no acute  distress HEENT: normal for age  Neck: JVD mildly elevated, no carotid bruit, no masses Cardiac: RRR; soft murmur, no rubs, or gallops Respiratory:  Few Rales bases bilaterally, normal work of breathing GI: soft, nontender, nondistended, + BS MS: no deformity or atrophy; L>R 1+ bilateral lower extremity edema; distal pulses are 2+ in all 4 extremities  Skin: warm and dry, no rash Neuro:  Strength and sensation are intact Psych: euthymic mood, full affect   EKG:  EKG is ordered today. The  ekg ordered today demonstrates sinus rhythm, rate 72, no acute ischemic changes and normal intervals   Recent Labs: 10/06/2014: ALT 15 10/07/2014: TSH 4.635* 01/22/2015: BUN 11; Creatinine, Ser 0.75; Hemoglobin 10.2*; Platelets 166; Potassium 4.0; Sodium 134*    Lipid Panel    Component Value Date/Time   CHOL 82 10/04/2014 0300   TRIG 67 10/04/2014 0300   HDL 32* 10/04/2014 0300   CHOLHDL 2.6 10/04/2014 0300   VLDL 13 10/04/2014 0300   LDLCALC 37 10/04/2014 0300     Wt Readings from Last 3 Encounters:  03/14/15 108 lb 11.2 oz (49.306 kg)  01/22/15 130 lb 11.7 oz (59.3 kg)  12/25/14 138 lb (62.596 kg)     Other studies Reviewed: Additional studies/ records that were reviewed today include: Hospital records, office notes from previous testing.  ASSESSMENT AND PLAN:  1.  Takotsubo cardiomyopathy: Her EF had improved by last checked in October 2016. She has some mild volume overload by exam with some lower extremity edema, but is unclear if this is daytime edema only or if she is waking with this. However, her blood pressure is too low to add any medications. She is on a very low dose of an ACE inhibitor and a beta blocker. In order to hopefully make her more able to tolerate them, we will change her ACE inhibitor to lunch time to separated From her other medications. Hopefully this will help tolerance of this medication.  2. Hypotension: Her blood pressure is low today and according to the  family, this is apparently a chronic problem. She is asymptomatic with a systolic blood pressure in the 90s, so no med changes will be made at this time. The family has no way to check her blood pressure so we will have to rely on symptoms, the family is encouraged to report them. We will check a BMET as she has been on potassium with no diuretic   Current medicines are reviewed at length with the patient today.  The patient does not have concerns regarding medicines.  The following changes have been made: Change the timing of the lisinopril to lunch.  Labs/ tests ordered today include:   Orders Placed This Encounter  Procedures  . Basic metabolic panel  . EKG 12-Lead     Disposition:   FU with Dr. Swaziland  Signed, Leanna Battles  03/14/2015 5:18 PM    [facility] [address] [place], [place], [place]  [address] Phone: [phone]; Fax: [phone]

## 2015-03-15 LAB — BASIC METABOLIC PANEL
BUN: 9 mg/dL (ref 7–25)
CALCIUM: 8 mg/dL — AB (ref 8.6–10.4)
CO2: 26 mmol/L (ref 20–31)
CREATININE: 0.6 mg/dL (ref 0.60–0.93)
Chloride: 100 mmol/L (ref 98–110)
GLUCOSE: 97 mg/dL (ref 65–99)
Potassium: 4.4 mmol/L (ref 3.5–5.3)
Sodium: 134 mmol/L — ABNORMAL LOW (ref 135–146)

## 2015-06-10 ENCOUNTER — Ambulatory Visit (INDEPENDENT_AMBULATORY_CARE_PROVIDER_SITE_OTHER): Payer: Medicare Other | Admitting: Cardiology

## 2015-06-10 ENCOUNTER — Encounter: Payer: Self-pay | Admitting: Cardiology

## 2015-06-10 VITALS — BP 111/60 | HR 59 | Ht 67.0 in | Wt 120.4 lb

## 2015-06-10 DIAGNOSIS — I10 Essential (primary) hypertension: Secondary | ICD-10-CM | POA: Diagnosis not present

## 2015-06-10 DIAGNOSIS — E785 Hyperlipidemia, unspecified: Secondary | ICD-10-CM

## 2015-06-10 DIAGNOSIS — I5181 Takotsubo syndrome: Secondary | ICD-10-CM

## 2015-06-10 NOTE — Patient Instructions (Signed)
Continue your current therapy  I will see you in 6 months.   

## 2015-06-10 NOTE — Progress Notes (Signed)
Patient ID: Charlene Vincent, female   DOB: 11-14-1934, 81 y.o.   MRN: [id number]    Date:  06/10/2015   ID:  Charlene Vincent, DOB 02/26/1934, MRN [id number]  PCP:  Tula Nakayama  Primary Cardiologist:   Charlene Eisenstein Martinique MD   Chief Complaint  Patient presents with  . Cardiomyopathy     History of Present Illness: Charlene Vincent is a 81 y.o. female is seen for follow up Takotsubo CM. In August she presented with a foot injury from a mechanical fall. Her foot was treated and a laceration was repaired. An ECG was performed and was significantly abnormal. Her troponin was elevated. The ECG met STEMI criteria and she was transferred emergently to [facility] and taken directly to the cath lab.    Left heart catheterization  10/02/2014 revealed normal coronary arteries.  LVEF was 25-35%.   Hyperdynamic base consistent with Takotsubo  Syndrome.   She had an echocardiogram on 10/03/2014 which confirmed an ejection fraction of 30-35%. There is akinesis of the mid apical anteroseptal, inferior and apical myocardium. Grade 2 diastolic dysfunction. Moderate LVH. She had a similar episode of stress induced cardiomyopathy in 2012. EF returned to normal following this episode.   On follow up today  She is felling very well. The patient currently denies chest pain, shortness of breath, orthopnea, dizziness, PND, cough, congestion,  lower extremity edema. Repeat Echo in October 2016 showed EF improved to 45-50%.   Wt Readings from Last 3 Encounters:  06/10/15 54.613 kg (120 lb 6.4 oz)  03/14/15 49.306 kg (108 lb 11.2 oz)  01/22/15 59.3 kg (130 lb 11.7 oz)     Past Medical History  Diagnosis Date  . Takotsubo cardiomyopathy     a. 2012  b. LHC in 09/2014 nl cors, EF 30-35%>>45-50% by echo 11/2014  . Hypothyroidism   . Anemia   . Costochondritis   . Arthritis   . Cellulitis   . Shingles   . Myocardial infarction [facility]) 2012, 2016    both Takotsubo  . Pneumonia   . Headache   . Tremor, essential       Current Outpatient Prescriptions  Medication Sig Dispense Refill  . atorvastatin (LIPITOR) 80 MG tablet Take 80 mg by mouth daily.    . carvedilol (COREG) 3.125 MG tablet Take 1 tablet (3.125 mg total) by mouth 2 (two) times daily with a meal.    . doxepin (SINEQUAN) 10 MG capsule Take 10 mg by mouth daily as needed (itching).    Marland Kitchen levothyroxine (SYNTHROID, LEVOTHROID) 75 MCG tablet Take 75 mcg by mouth daily before breakfast.    . lisinopril (PRINIVIL,ZESTRIL) 2.5 MG tablet Take 1 tablet (2.5 mg total) by mouth daily.    Marland Kitchen oxyCODONE-acetaminophen (PERCOCET/ROXICET) 5-325 MG per tablet Take 1 tablet by mouth every 6 (six) hours as needed for moderate pain. 30 tablet 0  . potassium chloride (K-DUR,KLOR-CON) 10 MEQ tablet Take 10 mEq by mouth daily.     No current facility-administered medications for this visit.    Allergies:    Allergies  Allergen Reactions  . Aspirin     But taking low dose therapy without problems.  . Penicillins Rash    Social History:  The patient  reports that she quit smoking about 4 years ago. Her smoking use included Cigarettes. She smoked 0.50 packs per day. She has never used smokeless tobacco. She reports that she does not drink alcohol or use illicit drugs.   Family history:   Family  History  Problem Relation Age of Onset  . Heart disease Father   . Heart disease Brother     ROS:  Please see the history of present illness.  All other systems reviewed and negative.   PHYSICAL EXAM: VS:  BP 111/60 mmHg  Pulse 59  Ht _0  (1.702 m)  Wt 54.613 kg (120 lb 6.4 oz)  BMI 18.85 kg/m2 Well nourished, thin, in no acute distress HEENT: Pupils are equal round react to light accommodation extraocular movements are intact.  Neck: no JVDNo cervical lymphadenopathy. Cardiac: Regular rate and rhythm without murmurs rubs or gallops. Lungs:  clear to auscultation bilaterally, no wheezing, rhonchi or rales Abd: soft, nontender, positive bowel sounds all  quadrants, no hepatosplenomegaly Ext: no lower extremity edema.  2+ radial and dorsalis pedis pulses.   Skin: warm and dry Neuro:  Grossly normal  Lipid Panel     Component Value Date/Time   CHOL 82 10/04/2014 0300   TRIG 67 10/04/2014 0300   HDL 32* 10/04/2014 0300   CHOLHDL 2.6 10/04/2014 0300   VLDL 13 10/04/2014 0300   LDLCALC 37 10/04/2014 0300      ASSESSMENT AND PLAN:  Problem List Items Addressed This Visit    HTN (hypertension) - Primary (Chronic)   Takotsubo cardiomyopathy   Hyperlipidemia      Takotsubo CM-   Initial episode in 2012 with recovery. Juncal on 10/02/14 w/ severe LV dysfunction and marked WMA of the mid to apical segments with hyperdynamic base c/w Takotsubo syndrome.  EF was 30-35%.  Improved to 45-50% by Echo in October. On coreg 3.125 mg BID and lisinopril 2.69m qd. Low BP has limited drug titration.   She appears euvolemic.     HLD-    Well-controlled.  continue statin.   Hypothyroidism-    Will continue current therapy and follow up in 6 months.

## 2015-08-06 ENCOUNTER — Other Ambulatory Visit: Payer: Self-pay | Admitting: Family Medicine

## 2015-08-25 ENCOUNTER — Emergency Department (HOSPITAL_COMMUNITY)
Admission: EM | Admit: 2015-08-25 | Discharge: 2015-08-25 | Disposition: A | Discharge status: Home / Self Care | Payer: Medicare Other | Attending: Emergency Medicine | Admitting: Emergency Medicine

## 2015-08-25 ENCOUNTER — Emergency Department (HOSPITAL_COMMUNITY): Payer: Medicare Other

## 2015-08-25 ENCOUNTER — Encounter (HOSPITAL_COMMUNITY): Payer: Self-pay | Admitting: *Deleted

## 2015-08-25 DIAGNOSIS — Z79899 Other long term (current) drug therapy: Secondary | ICD-10-CM | POA: Insufficient documentation

## 2015-08-25 DIAGNOSIS — M79672 Pain in left foot: Secondary | ICD-10-CM | POA: Diagnosis not present

## 2015-08-25 DIAGNOSIS — Z87891 Personal history of nicotine dependence: Secondary | ICD-10-CM | POA: Insufficient documentation

## 2015-08-25 DIAGNOSIS — M1990 Unspecified osteoarthritis, unspecified site: Secondary | ICD-10-CM | POA: Insufficient documentation

## 2015-08-25 DIAGNOSIS — I252 Old myocardial infarction: Secondary | ICD-10-CM | POA: Diagnosis not present

## 2015-08-25 DIAGNOSIS — E039 Hypothyroidism, unspecified: Secondary | ICD-10-CM | POA: Insufficient documentation

## 2015-08-25 MED ORDER — OXYCODONE-ACETAMINOPHEN 5-325 MG PO TABS
2.0000 | ORAL_TABLET | Freq: Once | ORAL | Status: AC
  Administered 2015-08-25: 2 via ORAL
  Filled 2015-08-25: qty 2

## 2015-08-25 NOTE — ED Provider Notes (Signed)
CSN: [id number]     Arrival date & time 08/25/15  0009 History  By signing my name below, I, [facility], attest that this documentation has been prepared under the direction and in the presence of Eber Hong, MD. Electronically Signed: Randell Patient, ED Scribe. 08/25/2015. 12:58 AM.   Chief Complaint  Patient presents with  . Foot Pain    The history is provided by the patient. No language interpreter was used.    HPI Comments: Charlene Vincent is a 81 y.o. female with a hx of arthritis, Takotsubo cardiomyopathy, and Takostubo MI who presents to the Emergency Department complaining of constant, mild left ankle pain onset 1 day ago. Pt states that she fractured her left ankle last year but was asymptomatic until yesterday when pain began. She has not been able to ambulate normally since pain began. Pain is worse with bearing weight and ambulation. She notes that at baseline she ambulates without a walker, cane, or assistance. She notes that she takes pain medication every other day. Denies erythema to the left ankle or any other symptoms currently.  Past Medical History  Diagnosis Date  . Takotsubo cardiomyopathy     a. 2012  b. LHC in 09/2014 nl cors, EF 30-35%>>45-50% by echo 11/2014  . Hypothyroidism   . Anemia   . Costochondritis   . Arthritis   . Cellulitis   . Shingles   . Myocardial infarction [facility]) 2012, 2016    both Takotsubo  . Pneumonia   . Headache   . Tremor, essential    Past Surgical History  Procedure Laterality Date  . Inguinal hernia repair    . Cardiac catheterization  September 10, 2010    EF 35 to 40%. No significant CAD. ? takotsubo cardiomyopathy  . Cardiac catheterization N/A 10/02/2014    Procedure: Left Heart Cath and Coronary Angiography;  Surgeon: Peter M Swaziland, MD;  Location: [facility] INVASIVE CV LAB;  Service: Cardiovascular;  Laterality: N/A;  . I&d extremity Left 10/09/2014    Procedure: DEBRIDEMENT LEFT FOOT;  Surgeon: Cammy Copa, MD;   Location: [facility] OR;  Service: Orthopedics;  Laterality: Left;  . Application of wound vac Left 10/09/2014    Procedure: APPLICATION OF WOUND VAC;  Surgeon: Cammy Copa, MD;  Location: [facility] OR;  Service: Orthopedics;  Laterality: Left;  . Skin full thickness graft Left 10/12/2014    Procedure: Debridement left foot, application integra, wound vacuum-assisted closure;  Surgeon: Glenna Fellows, MD;  Location: [facility] OR;  Service: Plastics;  Laterality: Left;  . Abdominal hysterectomy    . Skin graft Left 01/22/2015    left thigh to left foot  . Skin split graft Left 01/22/2015    Procedure:  SPLIT THICKNESS SKIN GRAFT FROM LEFT THIGH TO LEFT FOOT ;  Surgeon: Glenna Fellows, MD;  Location: [facility] OR;  Service: Plastics;  Laterality: Left;  . Application of a-cell of extremity Left 01/22/2015    Procedure: APPLICATION OF A-CELL TO LEFT THIGH ;  Surgeon: Glenna Fellows, MD;  Location: [facility] OR;  Service: Plastics;  Laterality: Left;   Family History  Problem Relation Age of Onset  . Heart disease Father   . Heart disease Brother    Social History  Substance Use Topics  . Smoking status: Former Smoker -- 0.50 packs/day    Types: Cigarettes    Quit date: 09/07/2010  . Smokeless tobacco: Never Used  . Alcohol Use: No   OB History    No data available  Review of Systems  Musculoskeletal: Positive for arthralgias (left ankle).  Skin: Negative for color change.      Allergies  Aspirin and Penicillins  Home Medications   Prior to Admission medications   Medication Sig Start Date End Date Taking? Authorizing Provider  atorvastatin (LIPITOR) 80 MG tablet Take 80 mg by mouth daily.   Yes Historical Provider, MD  carvedilol (COREG) 3.125 MG tablet Take 1 tablet (3.125 mg total) by mouth 2 (two) times daily with a meal. 10/12/14  Yes Shanker Levora Dredge, MD  doxepin (SINEQUAN) 10 MG capsule Take 10 mg by mouth daily as needed (itching).   Yes Historical Provider, MD  levothyroxine (SYNTHROID,  LEVOTHROID) 75 MCG tablet Take 75 mcg by mouth daily before breakfast.   Yes Historical Provider, MD  lisinopril (PRINIVIL,ZESTRIL) 2.5 MG tablet Take 1 tablet (2.5 mg total) by mouth daily. 10/12/14  Yes Shanker Levora Dredge, MD  oxyCODONE-acetaminophen (PERCOCET/ROXICET) 5-325 MG per tablet Take 1 tablet by mouth every 6 (six) hours as needed for moderate pain. Patient taking differently: Take 0.5 tablets by mouth 2 (two) times daily as needed for moderate pain.  10/12/14  Yes Shanker Levora Dredge, MD  potassium chloride (K-DUR,KLOR-CON) 10 MEQ tablet Take 10 mEq by mouth daily.   Yes Historical Provider, MD   BP 138/80 mmHg  Pulse 63  Temp(Src) 98.2 F (36.8 C) (Oral)  Resp 18  Ht 5\' 7"  (1.702 m)  Wt 142 lb (64.411 kg)  BMI 22.24 kg/m2  SpO2 100% Physical Exam  Constitutional: She appears well-developed and well-nourished.  HENT:  Head: Normocephalic and atraumatic.  Eyes: Conjunctivae are normal. Right eye exhibits no discharge. Left eye exhibits no discharge.  Cardiovascular:  Normal DP pulses bilaterally.  Pulmonary/Chest: Effort normal. No respiratory distress.  Musculoskeletal: She exhibits tenderness.  Tenderness with left ankle eversion and inversion. Tenderness over the achilles and bilateral malleoli. Normal coloration of skin to left foot with well-healed surgical scars. No deformities of left ankle.  Neurological: She is alert. Coordination normal.  Skin: Skin is warm and dry. No rash noted. She is not diaphoretic. No erythema.  Psychiatric: She has a normal mood and affect.  Nursing note and vitals reviewed.   ED Course  Procedures (including critical care time)  DIAGNOSTIC STUDIES: Oxygen Saturation is 100% on RA, normal by my interpretation.    COORDINATION OF CARE: 12:51 AM Will order left ankle x-ray and Percocet. Discussed treatment plan with pt at bedside and pt agreed to plan.   Labs Review Labs Reviewed - No data to display  Imaging Review Dg Ankle Complete  Left  08/25/2015  CLINICAL DATA:  Mild left ankle pain.  No recent trauma. EXAM: LEFT ANKLE COMPLETE - 3+ VIEW COMPARISON:  CT 10/02/2014 FINDINGS: Healed fractures of the medial and lateral malleolar I. No acute fracture. Mortise is symmetric. No bone lesion or bony destruction. No acute soft tissue abnormality. IMPRESSION: The fractures observed on 10/02/2014 have healed in good alignment and position. No acute finding. Electronically Signed   By: Ellery Plunk M.D.   On: 08/25/2015 02:28   I have personally reviewed and evaluated these images and lab results as part of my medical decision-making.    MDM   Final diagnoses:  Left foot pain   Well appearing, normal VS - xray shows healing bones Pt improved with meds Xray normal - pt informed Stable for d/c Can f/u with PCP - doubt infection / gout or other source of pain  Meds given  in ED:  Medications  oxyCODONE-acetaminophen (PERCOCET/ROXICET) 5-325 MG per tablet 2 tablet (2 tablets Oral Given 08/25/15 0111)      Eber Hong, MD 08/25/15 [phone]

## 2015-08-25 NOTE — Discharge Instructions (Signed)
Instead of a half a pill at bedtime, you may take a full pill for the next week - you have been given pain medicine here Please do NOT take any more pain medicine tonight See your family doctor for further testing this week if no significant improvement Come back to the ER for swelling, redness, fevers or severe pain

## 2015-08-25 NOTE — ED Notes (Signed)
Pt c/o left foot pain and left wrist pain that started yesterday, denies any injury,

## 2015-10-18 ENCOUNTER — Telehealth: Payer: Self-pay | Admitting: Cardiology

## 2015-10-18 NOTE — Telephone Encounter (Signed)
New Message  Pt call asking to speak with RN. Pt states she spoke with someone in our office but does not remember who she was speaking with. Pt states she was taking off of her pain meds for her leg that was broke and is now in pain and would like to speak with the RN she previous spoke too. Please call back to discuss

## 2015-10-18 NOTE — Telephone Encounter (Signed)
Returned call to patient-pt reports she broke her foot over 1 year ago and was recently taken off her pain medication (percocet).   Pt reports she does not have any pain medication left and is in pain.  Reports she was told by PCP if she continued to have issues with foot and pain she needed to see a cardiologist?    Advised patient back to PCP for pain control and further management of foot.  Pt verbalized understanding and will contact PCP.

## 2016-01-02 ENCOUNTER — Ambulatory Visit (INDEPENDENT_AMBULATORY_CARE_PROVIDER_SITE_OTHER): Payer: Medicare Other | Admitting: Cardiology

## 2016-01-02 ENCOUNTER — Encounter: Payer: Self-pay | Admitting: Cardiology

## 2016-01-02 VITALS — BP 130/76 | HR 80 | Ht 67.0 in | Wt 132.2 lb

## 2016-01-02 DIAGNOSIS — E7800 Pure hypercholesterolemia, unspecified: Secondary | ICD-10-CM | POA: Diagnosis not present

## 2016-01-02 DIAGNOSIS — I10 Essential (primary) hypertension: Secondary | ICD-10-CM

## 2016-01-02 DIAGNOSIS — I5181 Takotsubo syndrome: Secondary | ICD-10-CM

## 2016-01-02 NOTE — Patient Instructions (Signed)
Continue your current therapy  I will see you in one year   

## 2016-01-02 NOTE — Progress Notes (Signed)
Patient ID: Charlene Vincent, female   DOB: 10-17-1934, 81 y.o.   MRN: [id number]    Date:  01/02/2016   ID:  Charlene Vincent, DOB 26-Oct-1934, MRN [id number]  PCP:  Tula Nakayama  Primary Cardiologist:   Annison Birchard [place] MD   Chief Complaint  Patient presents with  . Follow-up    6 MONTHS; Pt staes no Sx.      History of Present Illness: Charlene Vincent is a 81 y.o. female is seen for follow up Takotsubo CM. In August she presented with a foot injury from a mechanical fall. Her foot was treated and a laceration was repaired. An ECG was performed and was significantly abnormal. Her troponin was elevated. The ECG met STEMI criteria and she was transferred emergently to [facility] and taken directly to the cath lab.    Left heart catheterization  10/02/2014 revealed normal coronary arteries.  LVEF was 25-35%.   Hyperdynamic base consistent with Takotsubo  Syndrome.   She had an echocardiogram on 10/03/2014 which confirmed an ejection fraction of 30-35%. There is akinesis of the mid apical anteroseptal, inferior and apical myocardium. Grade 2 diastolic dysfunction. Moderate LVH. She had a similar episode of stress induced cardiomyopathy in 2012. EF returned to normal following this episode.   On follow up today  She is felling very well. The patient currently denies chest pain, shortness of breath, orthopnea, dizziness, PND, cough, congestion,  lower extremity edema. Repeat Echo in October 2016 showed EF improved to 45-50%.   Wt Readings from Last 3 Encounters:  01/02/16 132 lb 3.2 oz (60 kg)  08/25/15 142 lb (64.4 kg)  06/10/15 120 lb 6.4 oz (54.6 kg)     Past Medical History:  Diagnosis Date  . Anemia   . Arthritis   . Cellulitis   . Costochondritis   . Headache   . Hypothyroidism   . Myocardial infarction 2012, 2016   both Takotsubo  . Pneumonia   . Shingles   . Takotsubo cardiomyopathy    a. 2012  b. LHC in 09/2014 nl cors, EF 30-35%>>45-50% by echo 11/2014  . Tremor,  essential     Current Outpatient Prescriptions  Medication Sig Dispense Refill  . atorvastatin (LIPITOR) 80 MG tablet Take 80 mg by mouth daily.    . carvedilol (COREG) 3.125 MG tablet Take 1 tablet (3.125 mg total) by mouth 2 (two) times daily with a meal.    . doxepin (SINEQUAN) 10 MG capsule Take 10 mg by mouth daily as needed (itching).    Marland Kitchen levothyroxine (SYNTHROID, LEVOTHROID) 75 MCG tablet Take 75 mcg by mouth daily before breakfast.    . lisinopril (PRINIVIL,ZESTRIL) 2.5 MG tablet Take 1 tablet (2.5 mg total) by mouth daily.    Marland Kitchen oxyCODONE-acetaminophen (PERCOCET/ROXICET) 5-325 MG per tablet Take 1 tablet by mouth every 6 (six) hours as needed for moderate pain. (Patient taking differently: Take 0.5 tablets by mouth 2 (two) times daily as needed for moderate pain. ) 30 tablet 0  . potassium chloride (K-DUR,KLOR-CON) 10 MEQ tablet Take 10 mEq by mouth daily.     No current facility-administered medications for this visit.     Allergies:    Allergies  Allergen Reactions  . Aspirin     But taking low dose therapy without problems.  . Penicillins Rash    Social History:  The patient  reports that she quit smoking about 5 years ago. Her smoking use included Cigarettes. She smoked 0.50 packs per day. She has  never used smokeless tobacco. She reports that she does not drink alcohol or use drugs.   Family history:   Family History  Problem Relation Age of Onset  . Heart disease Father   . Heart disease Brother     ROS:  Please see the history of present illness.  All other systems reviewed and negative.   PHYSICAL EXAM: VS:  BP 130/76   Pulse 80   Ht '5\' 7"'  (1.702 m)   Wt 132 lb 3.2 oz (60 kg)   BMI 20.71 kg/m  Well nourished, thin, in no acute distress  HEENT: Pupils are equal round react to light accommodation extraocular movements are intact.  Neck: no JVD No cervical lymphadenopathy. Cardiac: Regular rate and rhythm without murmurs rubs or gallops.  Lungs:  clear to  auscultation bilaterally, no wheezing, rhonchi or rales  Abd: soft, nontender, positive bowel sounds all quadrants, no hepatosplenomegaly  Ext: no lower extremity edema.  2+ radial and dorsalis pedis pulses.   Skin: warm and dry  Neuro:  Grossly normal  Lipid Panel     Component Value Date/Time   CHOL 82 10/04/2014 0300   TRIG 67 10/04/2014 0300   HDL 32 (L) 10/04/2014 0300   CHOLHDL 2.6 10/04/2014 0300   VLDL 13 10/04/2014 0300   LDLCALC 37 10/04/2014 0300   Lab Results  Component Value Date   WBC 7.9 01/22/2015   HGB 10.2 (L) 01/22/2015   HCT 31.7 (L) 01/22/2015   PLT 166 01/22/2015   GLUCOSE 97 03/14/2015   CHOL 82 10/04/2014   TRIG 67 10/04/2014   HDL 32 (L) 10/04/2014   LDLCALC 37 10/04/2014   ALT 15 10/06/2014   AST 26 10/06/2014   NA 134 (L) 03/14/2015   K 4.4 03/14/2015   CL 100 03/14/2015   CREATININE 0.60 03/14/2015   BUN 9 03/14/2015   CO2 26 03/14/2015   TSH 4.635 (H) 10/07/2014   INR 0.97 09/10/2010   HGBA1C 5.6 09/10/2010      ASSESSMENT AND PLAN:  Problem List Items Addressed This Visit    HTN (hypertension) (Chronic)   Takotsubo cardiomyopathy - Primary   Hyperlipidemia      Takotsubo CM-   Initial episode in 2012 with recovery. [place] on 10/02/14 w/ severe LV dysfunction and marked WMA of the mid to apical segments with hyperdynamic base c/w Takotsubo syndrome.  EF was 30-35%.  Improved to 45-50% by Echo in October 2016. On coreg 3.125 mg BID and lisinopril 2.20m qd. Low BP has limited drug titration.   She appears euvolemic. She is asymptomatic.    HLD-    Well-controlled.  continue statin.   Hypothyroidism-    Will continue current therapy and follow up in one year

## 2016-04-16 ENCOUNTER — Emergency Department (HOSPITAL_COMMUNITY): Payer: Medicare Other

## 2016-04-16 ENCOUNTER — Emergency Department (HOSPITAL_COMMUNITY)
Admission: EM | Admit: 2016-04-16 | Discharge: 2016-04-16 | Disposition: A | Discharge status: Home / Self Care | Payer: Medicare Other | Attending: Emergency Medicine | Admitting: Emergency Medicine

## 2016-04-16 ENCOUNTER — Encounter (HOSPITAL_COMMUNITY): Payer: Self-pay | Admitting: *Deleted

## 2016-04-16 DIAGNOSIS — M545 Low back pain: Secondary | ICD-10-CM | POA: Diagnosis present

## 2016-04-16 DIAGNOSIS — M5440 Lumbago with sciatica, unspecified side: Secondary | ICD-10-CM | POA: Diagnosis not present

## 2016-04-16 DIAGNOSIS — I252 Old myocardial infarction: Secondary | ICD-10-CM | POA: Insufficient documentation

## 2016-04-16 DIAGNOSIS — Z87891 Personal history of nicotine dependence: Secondary | ICD-10-CM | POA: Insufficient documentation

## 2016-04-16 DIAGNOSIS — I10 Essential (primary) hypertension: Secondary | ICD-10-CM | POA: Diagnosis not present

## 2016-04-16 DIAGNOSIS — E039 Hypothyroidism, unspecified: Secondary | ICD-10-CM | POA: Diagnosis not present

## 2016-04-16 DIAGNOSIS — Z79899 Other long term (current) drug therapy: Secondary | ICD-10-CM | POA: Diagnosis not present

## 2016-04-16 MED ORDER — PREDNISONE 10 MG PO TABS: 20.0000 mg | ORAL_TABLET | Freq: Every day | ORAL | 0 refills | Status: DC

## 2016-04-16 NOTE — Discharge Instructions (Signed)
Follow-up with your doctor next week for recheck. You can take an extra half pill of pain medicine a day for your leg and back if you need to

## 2016-04-16 NOTE — ED Provider Notes (Signed)
[facility]-EMERGENCY DEPT Provider Note   CSN: [id number] Arrival date & time: 04/16/16  2147   By signing my name below, I, Bobbie Stack, attest that this documentation has been prepared under the direction and in the presence of Bethann Berkshire, MD. Electronically Signed: Bobbie Stack, Scribe. 04/16/16. 10:55 PM. History   Chief Complaint Chief Complaint  Patient presents with  . Back Pain    The history is provided by the patient. No language interpreter was used.  Back Pain   This is a new problem. The current episode started more than 1 week ago. The problem occurs daily. The problem has been gradually worsening. The pain is present in the lumbar spine. Associated symptoms include leg pain. Pertinent negatives include no chest pain, no headaches and no abdominal pain. She has tried analgesics for the symptoms. The treatment provided mild relief.  HPI Comments: Charlene Vincent is a 82 y.o. female who presents to the Emergency Department complaining of gradually worsening intermittent right leg pain for the past month. She also reports associated back pain that began recently. She states that her leg pain has gotten progressively worse today. She states that the pain is now radiating into her back. She reports that the pain is worse in there right leg. She states that the pain worsens with movement of her leg. She states that she has been taking tylenol every 6 hours with no significant relief. She also reports taking her daily pain medications.  Past Medical History:  Diagnosis Date  . Anemia   . Arthritis   . Cellulitis   . Costochondritis   . Headache   . Hypothyroidism   . Myocardial infarction 2012, 2016   both Takotsubo  . Pneumonia   . Shingles   . Takotsubo cardiomyopathy    a. 2012  b. LHC in 09/2014 nl cors, EF 30-35%>>45-50% by echo 11/2014  . Tremor, essential     Patient Active Problem List   Diagnosis Date Noted  . Open wound of foot except toe(s) alone  01/22/2015  . Hyperlipidemia 10/25/2014  . STEMI (ST elevation myocardial infarction) (HCC) 10/09/2014  . Foot laceration   . Fever   . Acute MI 10/02/2014  . Foot fracture 10/02/2014  . Stress-induced cardiomyopathy 10/02/2014  . Demand ischemia (HCC) 10/02/2014  . Takotsubo cardiomyopathy   . Hyponatremia 07/20/2011  . Leukopenia 07/20/2011  . Abscess or cellulitis of gluteal region 07/17/2011  . Hypothyroidism 07/17/2011  . Anemia 07/17/2011  . HTN (hypertension) 07/17/2011    Past Surgical History:  Procedure Laterality Date  . ABDOMINAL HYSTERECTOMY    . APPLICATION OF A-CELL OF EXTREMITY Left 01/22/2015   Procedure: APPLICATION OF A-CELL TO LEFT THIGH ;  Surgeon: Glenna Fellows, MD;  Location: [facility] OR;  Service: Plastics;  Laterality: Left;  . APPLICATION OF WOUND VAC Left 10/09/2014   Procedure: APPLICATION OF WOUND VAC;  Surgeon: Cammy Copa, MD;  Location: [facility] OR;  Service: Orthopedics;  Laterality: Left;  . CARDIAC CATHETERIZATION  September 10, 2010   EF 35 to 40%. No significant CAD. ? takotsubo cardiomyopathy  . CARDIAC CATHETERIZATION N/A 10/02/2014   Procedure: Left Heart Cath and Coronary Angiography;  Surgeon: Peter M Swaziland, MD;  Location: [facility] INVASIVE CV LAB;  Service: Cardiovascular;  Laterality: N/A;  . I&D EXTREMITY Left 10/09/2014   Procedure: DEBRIDEMENT LEFT FOOT;  Surgeon: Cammy Copa, MD;  Location: [facility] OR;  Service: Orthopedics;  Laterality: Left;  . INGUINAL HERNIA REPAIR    .  SKIN FULL THICKNESS GRAFT Left 10/12/2014   Procedure: Debridement left foot, application integra, wound vacuum-assisted closure;  Surgeon: Glenna Fellows, MD;  Location: [facility] OR;  Service: Plastics;  Laterality: Left;  . SKIN GRAFT Left 01/22/2015   left thigh to left foot  . SKIN SPLIT GRAFT Left 01/22/2015   Procedure:  SPLIT THICKNESS SKIN GRAFT FROM LEFT THIGH TO LEFT FOOT ;  Surgeon: Glenna Fellows, MD;  Location: [facility] OR;  Service: Plastics;  Laterality: Left;    OB  History    No data available       Home Medications    Prior to Admission medications   Medication Sig Start Date End Date Taking? Authorizing Provider  atorvastatin (LIPITOR) 80 MG tablet Take 80 mg by mouth daily.    Historical Provider, MD  carvedilol (COREG) 3.125 MG tablet Take 1 tablet (3.125 mg total) by mouth 2 (two) times daily with a meal. 10/12/14   Shanker Levora Dredge, MD  doxepin (SINEQUAN) 10 MG capsule Take 10 mg by mouth daily as needed (itching).    Historical Provider, MD  levothyroxine (SYNTHROID, LEVOTHROID) 75 MCG tablet Take 75 mcg by mouth daily before breakfast.    Historical Provider, MD  lisinopril (PRINIVIL,ZESTRIL) 2.5 MG tablet Take 1 tablet (2.5 mg total) by mouth daily. 10/12/14   Shanker Levora Dredge, MD  oxyCODONE-acetaminophen (PERCOCET/ROXICET) 5-325 MG per tablet Take 1 tablet by mouth every 6 (six) hours as needed for moderate pain. Patient taking differently: Take 0.5 tablets by mouth 2 (two) times daily as needed for moderate pain.  10/12/14   Shanker Levora Dredge, MD  potassium chloride (K-DUR,KLOR-CON) 10 MEQ tablet Take 10 mEq by mouth daily.    Historical Provider, MD    Family History Family History  Problem Relation Age of Onset  . Heart disease Father   . Heart disease Brother     Social History Social History  Substance Use Topics  . Smoking status: Former Smoker    Packs/day: 0.50    Types: Cigarettes    Quit date: 09/07/2010  . Smokeless tobacco: Never Used  . Alcohol use No     Allergies   Aspirin and Penicillins   Review of Systems Review of Systems  Constitutional: Negative for appetite change and fatigue.  HENT: Negative for congestion, ear discharge and sinus pressure.   Eyes: Negative for discharge.  Respiratory: Negative for cough.   Cardiovascular: Negative for chest pain.  Gastrointestinal: Negative for abdominal pain and diarrhea.  Genitourinary: Negative for frequency and hematuria.  Musculoskeletal: Positive for back  pain and myalgias (Right Leg Pain).  Skin: Negative for rash.  Neurological: Negative for seizures and headaches.  Psychiatric/Behavioral: Negative for hallucinations.  All other systems reviewed and are negative.    Physical Exam Updated Vital Signs BP 133/67 (BP Location: Left Arm)   Pulse 60   Temp 97.8 F (36.6 C) (Oral)   Resp 18   Ht 5\' 7"  (1.702 m)   Wt 132 lb (59.9 kg)   SpO2 100%   BMI 20.67 kg/m   Physical Exam  Constitutional: She is oriented to person, place, and time. She appears well-developed.  HENT:  Head: Normocephalic.  Eyes: Conjunctivae are normal.  Neck: No tracheal deviation present.  Cardiovascular:  No murmur heard. Musculoskeletal: Normal range of motion. She exhibits tenderness.  Minor lumbar tenderness.  Neurological: She is oriented to person, place, and time.  Skin: Skin is warm.  Psychiatric: She has a normal mood and affect.  Nursing  note and vitals reviewed.    ED Treatments / Results  DIAGNOSTIC STUDIES: Oxygen Saturation is 100% on RA, normal by my interpretation.    COORDINATION OF CARE: 10:40 PM Discussed treatment plan with pt at bedside and pt agreed to plan. I will check the patient's X-ray of her lumbar spine.  Labs (all labs ordered are listed, but only abnormal results are displayed) Labs Reviewed - No data to display  EKG  EKG Interpretation None       Radiology Dg Lumbar Spine Complete  Result Date: 04/16/2016 CLINICAL DATA:  Chronic low back pain EXAM: LUMBAR SPINE - COMPLETE 4+ VIEW COMPARISON:  CT 10/07/2008 FINDINGS: Moderate levoscoliosis of the lumbar spine. Aortic atherosclerosis calcification. Five non rib-bearing lumbar type vertebra. Stable smooth oval calcification right hemipelvis. Grade 1 anterolisthesis of L4 on L5. Mild retrolisthesis of L2 on L3. Mild loss of height and compression of L2 vertebral body, new since 2010. Mild narrowing at L4-L5, moderate narrowing at L2-L3 and L5-S1. Posterior facet  disease of the lumbar spine. IMPRESSION: 1. Mild loss of height in compression of L2 vertebral body of uncertain acuity but new compared with 2010 CT 2. Mild to moderate multilevel degenerative changes with posterior facet disease. 3. Aortic atherosclerosis. Electronically Signed   By: Jasmine Pang M.D.   On: 04/16/2016 22:46    Procedures Procedures (including critical care time)  Medications Ordered in ED Medications - No data to display   Initial Impression / Assessment and Plan / ED Course  I have reviewed the triage vital signs and the nursing notes.  Pertinent labs & imaging results that were available during my care of the patient were reviewed by me and considered in my medical decision making (see chart for details).     Patient with back pain and sciatic pain down the right leg. She will be put on a short course of prednisone and is instructed to take extra pain medicine if necessary. She will follow-up with her PCP  Final Clinical Impressions(s) / ED Diagnoses   Final diagnoses:  None    New Prescriptions New Prescriptions   No medications on file   *The chart was scribed for me under my direct supervision.  I personally performed the history, physical, and medical decision making and all procedures in the evaluation of this patient.Bethann Berkshire, MD 04/16/16 [phone]

## 2016-04-16 NOTE — ED Triage Notes (Signed)
Pt c/o lower back pain and right leg pain that has gotten progressively worse today; pt states she took tylenol pta with no relief

## 2016-05-02 ENCOUNTER — Emergency Department (HOSPITAL_COMMUNITY)
Admission: EM | Admit: 2016-05-02 | Discharge: 2016-05-03 | Disposition: A | Discharge status: Home / Self Care | Payer: Medicare Other | Attending: Emergency Medicine | Admitting: Emergency Medicine

## 2016-05-02 ENCOUNTER — Encounter (HOSPITAL_COMMUNITY): Payer: Self-pay | Admitting: *Deleted

## 2016-05-02 DIAGNOSIS — Z79899 Other long term (current) drug therapy: Secondary | ICD-10-CM | POA: Diagnosis not present

## 2016-05-02 DIAGNOSIS — Z87891 Personal history of nicotine dependence: Secondary | ICD-10-CM | POA: Insufficient documentation

## 2016-05-02 DIAGNOSIS — M25572 Pain in left ankle and joints of left foot: Secondary | ICD-10-CM | POA: Insufficient documentation

## 2016-05-02 DIAGNOSIS — M25571 Pain in right ankle and joints of right foot: Secondary | ICD-10-CM | POA: Insufficient documentation

## 2016-05-02 DIAGNOSIS — E039 Hypothyroidism, unspecified: Secondary | ICD-10-CM | POA: Insufficient documentation

## 2016-05-02 DIAGNOSIS — I10 Essential (primary) hypertension: Secondary | ICD-10-CM | POA: Diagnosis not present

## 2016-05-02 NOTE — ED Triage Notes (Signed)
Pt c/o bilateral ankle pain that started tonight, denies any injury, no swelling noted,

## 2016-05-03 ENCOUNTER — Emergency Department (HOSPITAL_COMMUNITY): Payer: Medicare Other

## 2016-05-03 LAB — CBC WITH DIFFERENTIAL/PLATELET
BASOS PCT: 1 %
Basophils Absolute: 0 10*3/uL (ref 0.0–0.1)
Eosinophils Absolute: 0.2 10*3/uL (ref 0.0–0.7)
Eosinophils Relative: 3 %
HEMATOCRIT: 38.5 % (ref 36.0–46.0)
Hemoglobin: 12.4 g/dL (ref 12.0–15.0)
LYMPHS ABS: 1.7 10*3/uL (ref 0.7–4.0)
Lymphocytes Relative: 28 %
MCH: 31.3 pg (ref 26.0–34.0)
MCHC: 32.2 g/dL (ref 30.0–36.0)
MCV: 97.2 fL (ref 78.0–100.0)
MONO ABS: 0.8 10*3/uL (ref 0.1–1.0)
MONOS PCT: 13 %
NEUTROS ABS: 3.5 10*3/uL (ref 1.7–7.7)
Neutrophils Relative %: 55 %
Platelets: 155 10*3/uL (ref 150–400)
RBC: 3.96 MIL/uL (ref 3.87–5.11)
RDW: 14.2 % (ref 11.5–15.5)
WBC: 6.2 10*3/uL (ref 4.0–10.5)

## 2016-05-03 LAB — BASIC METABOLIC PANEL
Anion gap: 8 (ref 5–15)
BUN: 17 mg/dL (ref 6–20)
CALCIUM: 9.3 mg/dL (ref 8.9–10.3)
CO2: 27 mmol/L (ref 22–32)
CREATININE: 0.86 mg/dL (ref 0.44–1.00)
Chloride: 102 mmol/L (ref 101–111)
GFR calc Af Amer: >60 mL/min (ref >60)
GFR calc non Af Amer: >60 mL/min (ref >60)
GLUCOSE: 99 mg/dL (ref 65–99)
Potassium: 4 mmol/L (ref 3.5–5.1)
Sodium: 137 mmol/L (ref 135–145)

## 2016-05-03 LAB — CK: Total CK: 79 U/L (ref 38–234)

## 2016-05-03 NOTE — Discharge Instructions (Signed)
Your testing is reassuring. Follow up with your doctor. Take your pain medication as prescribed. Return to the ED if you develop new or worsening symptoms.

## 2016-05-03 NOTE — ED Provider Notes (Signed)
[facility]-EMERGENCY DEPT Provider Note   CSN: [id number] Arrival date & time: 05/02/16  2310   By signing my name below, I, Talbert Nan, attest that this documentation has been prepared under the direction and in the presence of Glynn Octave, MD. Electronically Signed: Talbert Nan, Scribe. 05/03/16. 12:18 AM.    History   Chief Complaint Chief Complaint  Patient presents with  . Ankle Pain    HPI Charlene Vincent is a 82 y.o. female with h/o HTN, arthritis, STEMI who presents to the Emergency Department complaining of bilateral ankle pain without injury that started tonight. Pt has no other associated symptoms. Pt took 2 tylenol without relief, a oxycodone without relief. Pt has never had this pain before. Both ankles hurt the same. Pt had arthritis in right leg. Pt is not diabetic. Pt does not take blood thinners. Pt is ambulatory. Pt denies fever, emesis, abdominal pain, knee pain, hip pain, back pain.    The history is provided by the patient. No language interpreter was used.    Past Medical History:  Diagnosis Date  . Anemia   . Arthritis   . Cellulitis   . Costochondritis   . Headache   . Hypothyroidism   . Myocardial infarction 2012, 2016   both Takotsubo  . Pneumonia   . Shingles   . Takotsubo cardiomyopathy    a. 2012  b. LHC in 09/2014 nl cors, EF 30-35%>>45-50% by echo 11/2014  . Tremor, essential     Patient Active Problem List   Diagnosis Date Noted  . Open wound of foot except toe(s) alone 01/22/2015  . Hyperlipidemia 10/25/2014  . STEMI (ST elevation myocardial infarction) (HCC) 10/09/2014  . Foot laceration   . Fever   . Acute MI 10/02/2014  . Foot fracture 10/02/2014  . Stress-induced cardiomyopathy 10/02/2014  . Demand ischemia (HCC) 10/02/2014  . Takotsubo cardiomyopathy   . Hyponatremia 07/20/2011  . Leukopenia 07/20/2011  . Abscess or cellulitis of gluteal region 07/17/2011  . Hypothyroidism 07/17/2011  . Anemia 07/17/2011  . HTN  (hypertension) 07/17/2011    Past Surgical History:  Procedure Laterality Date  . ABDOMINAL HYSTERECTOMY    . APPLICATION OF A-CELL OF EXTREMITY Left 01/22/2015   Procedure: APPLICATION OF A-CELL TO LEFT THIGH ;  Surgeon: Glenna Fellows, MD;  Location: [facility] OR;  Service: Plastics;  Laterality: Left;  . APPLICATION OF WOUND VAC Left 10/09/2014   Procedure: APPLICATION OF WOUND VAC;  Surgeon: Cammy Copa, MD;  Location: [facility] OR;  Service: Orthopedics;  Laterality: Left;  . CARDIAC CATHETERIZATION  September 10, 2010   EF 35 to 40%. No significant CAD. ? takotsubo cardiomyopathy  . CARDIAC CATHETERIZATION N/A 10/02/2014   Procedure: Left Heart Cath and Coronary Angiography;  Surgeon: Peter M Swaziland, MD;  Location: [facility] INVASIVE CV LAB;  Service: Cardiovascular;  Laterality: N/A;  . I&D EXTREMITY Left 10/09/2014   Procedure: DEBRIDEMENT LEFT FOOT;  Surgeon: Cammy Copa, MD;  Location: [facility] OR;  Service: Orthopedics;  Laterality: Left;  . INGUINAL HERNIA REPAIR    . SKIN FULL THICKNESS GRAFT Left 10/12/2014   Procedure: Debridement left foot, application integra, wound vacuum-assisted closure;  Surgeon: Glenna Fellows, MD;  Location: [facility] OR;  Service: Plastics;  Laterality: Left;  . SKIN GRAFT Left 01/22/2015   left thigh to left foot  . SKIN SPLIT GRAFT Left 01/22/2015   Procedure:  SPLIT THICKNESS SKIN GRAFT FROM LEFT THIGH TO LEFT FOOT ;  Surgeon: Glenna Fellows, MD;  Location: [facility]  OR;  Service: Government social research officer;  Laterality: Left;    OB History    No data available       Home Medications    Prior to Admission medications   Medication Sig Start Date End Date Taking? Authorizing Provider  acetaminophen (TYLENOL) 500 MG tablet Take 1,000 mg by mouth every 6 (six) hours.    Historical Provider, MD  atorvastatin (LIPITOR) 80 MG tablet Take 80 mg by mouth daily.    Historical Provider, MD  carvedilol (COREG) 3.125 MG tablet Take 1 tablet (3.125 mg total) by mouth 2 (two) times daily with a  meal. 10/12/14   Shanker Levora Dredge, MD  doxepin (SINEQUAN) 10 MG capsule Take 10 mg by mouth daily as needed (itching).    Historical Provider, MD  levothyroxine (SYNTHROID, LEVOTHROID) 88 MCG tablet Take 88 mcg by mouth daily. 04/08/16   Historical Provider, MD  lisinopril (PRINIVIL,ZESTRIL) 2.5 MG tablet Take 1 tablet (2.5 mg total) by mouth daily. 10/12/14   Shanker Levora Dredge, MD  oxyCODONE-acetaminophen (PERCOCET/ROXICET) 5-325 MG tablet Take 0.5-1 tablets by mouth daily. Alternates taking one-half tablet daily with one tablet daily 04/14/16   Historical Provider, MD  potassium chloride (K-DUR,KLOR-CON) 10 MEQ tablet Take 10 mEq by mouth daily.    Historical Provider, MD  predniSONE (DELTASONE) 10 MG tablet Take 2 tablets (20 mg total) by mouth daily. 04/16/16   Bethann Berkshire, MD    Family History Family History  Problem Relation Age of Onset  . Heart disease Father   . Heart disease Brother     Social History Social History  Substance Use Topics  . Smoking status: Former Smoker    Packs/day: 0.50    Types: Cigarettes    Quit date: 09/07/2010  . Smokeless tobacco: Never Used  . Alcohol use No     Allergies   Aspirin and Penicillins   Review of Systems Review of Systems  A complete 10 system review of systems was obtained and all systems are negative except as noted in the HPI and PMH.    Physical Exam Updated Vital Signs BP 132/70   Pulse 69   Temp 97.6 F (36.4 C) (Oral)   Resp 18   Ht 5\' 7"  (1.702 m)   Wt 132 lb (59.9 kg)   SpO2 97%   BMI 20.67 kg/m   Physical Exam  Constitutional: She is oriented to person, place, and time. She appears well-developed and well-nourished. No distress.  HENT:  Head: Normocephalic and atraumatic.  Mouth/Throat: Oropharynx is clear and moist. No oropharyngeal exudate.  Eyes: Conjunctivae and EOM are normal. Pupils are equal, round, and reactive to light.  Neck: Normal range of motion. Neck supple.  No meningismus.    Cardiovascular: Normal rate, regular rhythm, normal heart sounds and intact distal pulses.   No murmur heard. Pulmonary/Chest: Effort normal and breath sounds normal. No respiratory distress.  Abdominal: Soft. There is no tenderness. There is no rebound and no guarding.  Musculoskeletal: Normal range of motion. She exhibits tenderness. She exhibits no edema.  Bilateral ankles appear normal. Lateral malleolar tenderness bilaterally. Full ROM without erythema or effusion. Intact DP and PT pulses with doppler. Right foot appears paler than left, but they are equally warm. Full ROM of knees.  Neurological: She is alert and oriented to person, place, and time. No cranial nerve deficit. She exhibits normal muscle tone. Coordination normal.   5/5 strength throughout. CN 2-12 intact.Equal grip strength.   Skin: Skin is warm.  Psychiatric: She  has a normal mood and affect. Her behavior is normal.  Nursing note and vitals reviewed.    ED Treatments / Results   DIAGNOSTIC STUDIES: Oxygen Saturation is 97% on room air, normal by my interpretation.    COORDINATION OF CARE: 12:10 AM Discussed treatment plan with pt at bedside and pt agreed to plan, which includes [place], Xr of ankles, normal labs.    Labs (all labs ordered are listed, but only abnormal results are displayed) Labs Reviewed  CBC WITH DIFFERENTIAL/PLATELET  BASIC METABOLIC PANEL  CK    EKG  EKG Interpretation None       Radiology Dg Ankle Complete Left  Result Date: 05/03/2016 CLINICAL DATA:  Nontraumatic ankle pain, onset tonight. EXAM: LEFT ANKLE COMPLETE - 3+ VIEW COMPARISON:  None. FINDINGS: There is no evidence of fracture, dislocation, or joint effusion. There is no evidence of arthropathy or other focal bone abnormality. Soft tissues are unremarkable. IMPRESSION: Negative. Electronically Signed   By: Ellery Plunk M.D.   On: 05/03/2016 03:08   Dg Ankle Complete Right  Result Date: 05/03/2016 CLINICAL DATA:   Nontraumatic ankle pain, onset tonight. EXAM: RIGHT ANKLE - COMPLETE 3+ VIEW COMPARISON:  None. FINDINGS: There is no evidence of fracture, dislocation, or joint effusion. There is no evidence of arthropathy or other focal bone abnormality. Soft tissues are unremarkable. IMPRESSION: Negative. Electronically Signed   By: Ellery Plunk M.D.   On: 05/03/2016 03:09    Procedures Procedures (including critical care time)  Medications Ordered in ED Medications - No data to display   Initial Impression / Assessment and Plan / ED Course  I have reviewed the triage vital signs and the nursing notes.  Pertinent labs & imaging results that were available during my care of the patient were reviewed by me and considered in my medical decision making (see chart for details).     Bilateral ankle pain tonight without trauma. No fever or vomiting.  No bony abnormalities. No swelling. Intact pulses with Doppler. Full range of motion.  Electrolytes and CK normal.  X-rays negative. Ankle pain has improved. There is no evidence of septic joint.  Patient has pain medication at home. Follow up with her PCP. Return precautions discussed.  Final Clinical Impressions(s) / ED Diagnoses   Final diagnoses:  Bilateral ankle pain, unspecified chronicity    New Prescriptions New Prescriptions   No medications on file   I personally performed the services described in this documentation, which was scribed in my presence. The recorded information has been reviewed and is accurate.     Glynn Octave, MD 05/03/16 [phone]

## 2016-08-24 ENCOUNTER — Telehealth: Payer: Self-pay | Admitting: Cardiovascular Disease

## 2016-08-24 ENCOUNTER — Telehealth: Payer: Self-pay | Admitting: Cardiology

## 2016-08-24 NOTE — Telephone Encounter (Signed)
Pt wants to know when she need her next appointment with Dr Swaziland?

## 2016-08-24 NOTE — Telephone Encounter (Signed)
Returned call to patient.Appointment scheduled with Dr.Jordan 11/25/16 at 11:00 am.

## 2016-08-24 NOTE — Telephone Encounter (Signed)
Wrong provide °

## 2016-09-25 ENCOUNTER — Emergency Department (HOSPITAL_COMMUNITY)
Admission: EM | Admit: 2016-09-25 | Discharge: 2016-09-25 | Disposition: A | Discharge status: Home / Self Care | Payer: Medicare Other | Attending: Emergency Medicine | Admitting: Emergency Medicine

## 2016-09-25 ENCOUNTER — Encounter (HOSPITAL_COMMUNITY): Payer: Self-pay | Admitting: Emergency Medicine

## 2016-09-25 DIAGNOSIS — I252 Old myocardial infarction: Secondary | ICD-10-CM | POA: Insufficient documentation

## 2016-09-25 DIAGNOSIS — Z87891 Personal history of nicotine dependence: Secondary | ICD-10-CM | POA: Diagnosis not present

## 2016-09-25 DIAGNOSIS — I10 Essential (primary) hypertension: Secondary | ICD-10-CM | POA: Insufficient documentation

## 2016-09-25 DIAGNOSIS — Z79899 Other long term (current) drug therapy: Secondary | ICD-10-CM | POA: Diagnosis not present

## 2016-09-25 DIAGNOSIS — M791 Myalgia: Secondary | ICD-10-CM | POA: Insufficient documentation

## 2016-09-25 DIAGNOSIS — E039 Hypothyroidism, unspecified: Secondary | ICD-10-CM | POA: Insufficient documentation

## 2016-09-25 DIAGNOSIS — M7918 Myalgia, other site: Secondary | ICD-10-CM

## 2016-09-25 MED ORDER — IBUPROFEN 200 MG PO TABS: 200.0000 mg | ORAL_TABLET | Freq: Three times a day (TID) | ORAL | 0 refills | Status: AC

## 2016-09-25 NOTE — ED Triage Notes (Addendum)
Back pain complaint since this morning reports since eating foof has gotten worse Dr Arlyce Dice PCP

## 2016-09-25 NOTE — ED Provider Notes (Signed)
[facility]-EMERGENCY DEPT Provider Note   CSN: [id number] Arrival date & time: 09/25/16  1227     History   Chief Complaint Chief Complaint  Patient presents with  . Back Pain    HPI Charlene Vincent is a 82 y.o. female.  HPI  1 month of intermittent right gluteal pain. Symptoms are radiates down her leg to her toes. It often happens after sitting for long period of time or sometimes when she wakes up in the morning. She thinks is related to not being on Metamucil and having decreased bowel movements. She has no abdominal pain, fevers, blood in her stool or dark stools, cough, shortness of breath or history of cancer. No other modifying or associated factors.  Past Medical History:  Diagnosis Date  . Anemia   . Arthritis   . Cellulitis   . Costochondritis   . Headache   . Hypothyroidism   . Myocardial infarction [facility]) 2012, 2016   both Takotsubo  . Pneumonia   . Shingles   . Takotsubo cardiomyopathy    a. 2012  b. LHC in 09/2014 nl cors, EF 30-35%>>45-50% by echo 11/2014  . Tremor, essential     Patient Active Problem List   Diagnosis Date Noted  . Open wound of foot except toe(s) alone 01/22/2015  . Hyperlipidemia 10/25/2014  . STEMI (ST elevation myocardial infarction) (HCC) 10/09/2014  . Foot laceration   . Fever   . Acute MI (HCC) 10/02/2014  . Foot fracture 10/02/2014  . Stress-induced cardiomyopathy 10/02/2014  . Demand ischemia (HCC) 10/02/2014  . Takotsubo cardiomyopathy   . Hyponatremia 07/20/2011  . Leukopenia 07/20/2011  . Abscess or cellulitis of gluteal region 07/17/2011  . Hypothyroidism 07/17/2011  . Anemia 07/17/2011  . HTN (hypertension) 07/17/2011    Past Surgical History:  Procedure Laterality Date  . ABDOMINAL HYSTERECTOMY    . APPLICATION OF A-CELL OF EXTREMITY Left 01/22/2015   Procedure: APPLICATION OF A-CELL TO LEFT THIGH ;  Surgeon: Glenna Fellows, MD;  Location: [facility] OR;  Service: Plastics;  Laterality: Left;  . APPLICATION OF WOUND  VAC Left 10/09/2014   Procedure: APPLICATION OF WOUND VAC;  Surgeon: Cammy Copa, MD;  Location: [facility] OR;  Service: Orthopedics;  Laterality: Left;  . CARDIAC CATHETERIZATION  September 10, 2010   EF 35 to 40%. No significant CAD. ? takotsubo cardiomyopathy  . CARDIAC CATHETERIZATION N/A 10/02/2014   Procedure: Left Heart Cath and Coronary Angiography;  Surgeon: Peter M Swaziland, MD;  Location: [facility] INVASIVE CV LAB;  Service: Cardiovascular;  Laterality: N/A;  . I&D EXTREMITY Left 10/09/2014   Procedure: DEBRIDEMENT LEFT FOOT;  Surgeon: Cammy Copa, MD;  Location: [facility] OR;  Service: Orthopedics;  Laterality: Left;  . INGUINAL HERNIA REPAIR    . SKIN FULL THICKNESS GRAFT Left 10/12/2014   Procedure: Debridement left foot, application integra, wound vacuum-assisted closure;  Surgeon: Glenna Fellows, MD;  Location: [facility] OR;  Service: Plastics;  Laterality: Left;  . SKIN GRAFT Left 01/22/2015   left thigh to left foot  . SKIN SPLIT GRAFT Left 01/22/2015   Procedure:  SPLIT THICKNESS SKIN GRAFT FROM LEFT THIGH TO LEFT FOOT ;  Surgeon: Glenna Fellows, MD;  Location: [facility] OR;  Service: Plastics;  Laterality: Left;    OB History    No data available       Home Medications    Prior to Admission medications   Medication Sig Start Date End Date Taking? Authorizing Provider  acetaminophen (TYLENOL) 500 MG tablet Take  1,000 mg by mouth every 6 (six) hours.    [provider]  atorvastatin (LIPITOR) 80 MG tablet Take 80 mg by mouth daily.    [provider]  carvedilol (COREG) 3.125 MG tablet Take 1 tablet (3.125 mg total) by mouth 2 (two) times daily with a meal. 10/12/14   Ghimire, Werner Lean, MD  doxepin (SINEQUAN) 10 MG capsule Take 10 mg by mouth daily as needed (itching).    [provider]  ibuprofen (MOTRIN IB) 200 MG tablet Take 1 tablet (200 mg total) by mouth 3 (three) times daily. 09/25/16 09/30/16  Woody Kronberg, Barbara Cower, MD  levothyroxine (SYNTHROID, LEVOTHROID) 88 MCG tablet  Take 88 mcg by mouth daily. 04/08/16   [provider]  lisinopril (PRINIVIL,ZESTRIL) 2.5 MG tablet Take 1 tablet (2.5 mg total) by mouth daily. 10/12/14   Ghimire, Werner Lean, MD  oxyCODONE-acetaminophen (PERCOCET/ROXICET) 5-325 MG tablet Take 0.5-1 tablets by mouth daily. Alternates taking one-half tablet daily with one tablet daily 04/14/16   [provider]  potassium chloride (K-DUR,KLOR-CON) 10 MEQ tablet Take 10 mEq by mouth daily.    [provider]  predniSONE (DELTASONE) 10 MG tablet Take 2 tablets (20 mg total) by mouth daily. 04/16/16   Bethann Berkshire, MD    Family History Family History  Problem Relation Age of Onset  . Heart disease Father   . Heart disease Brother     Social History Social History  Substance Use Topics  . Smoking status: Former Smoker    Packs/day: 0.50    Types: Cigarettes    Quit date: 09/07/2010  . Smokeless tobacco: Never Used  . Alcohol use No     Allergies   Aspirin and Penicillins   Review of Systems Review of Systems  Constitutional: Negative for fatigue and fever.  Gastrointestinal: Positive for constipation. Negative for abdominal pain, anal bleeding, blood in stool, diarrhea, nausea and vomiting.  Skin: Negative for color change and rash.  All other systems reviewed and are negative.    Physical Exam Updated Vital Signs BP (!) 117/51 (BP Location: Right Arm)   Pulse 73   Temp 98.1 F (36.7 C) (Oral)   Resp 20   Ht 5\' 7"  (1.702 m)   Wt 55.8 kg (123 lb)   SpO2 96%   BMI 19.26 kg/m   Physical Exam  Constitutional: She appears well-developed and well-nourished.  HENT:  Head: Normocephalic and atraumatic.  Eyes: Conjunctivae and EOM are normal. Right eye exhibits no discharge. Left eye exhibits no discharge.  Neck: Normal range of motion.  Cardiovascular: Normal rate and regular rhythm.   Pulmonary/Chest: No stridor. No respiratory distress.  Abdominal: Soft. She exhibits no distension and no mass.  There is no tenderness. There is no guarding.  Musculoskeletal: She exhibits tenderness (over right gluteal area). She exhibits no edema.  Neurological: She is alert.  Skin: Skin is warm and dry. No rash noted.  Nursing note and vitals reviewed.    ED Treatments / Results  Labs (all labs ordered are listed, but only abnormal results are displayed) Labs Reviewed - No data to display  EKG  EKG Interpretation None       Radiology No results found.  Procedures Procedures (including critical care time)  Medications Ordered in ED Medications - No data to display   Initial Impression / Assessment and Plan / ED Course  I have reviewed the triage vital signs and the nursing notes.  Pertinent labs & imaging results that were available during  my care of the patient were reviewed by me and considered in my medical decision making (see chart for details).     Suspect likely sciatica vs muscular cause for symptoms. She thinks it is related to not being on metamucil.  Neuro exam normal - doubt surgical causes for her new sciatica. Low gluteal area - doubt abdominal/urologic/aortic causes for her symptoms.  No rash to suggest zoster.  Will treat conservatively and pcp follow up if not improving.   Final Clinical Impressions(s) / ED Diagnoses   Final diagnoses:  Gluteal pain    New Prescriptions New Prescriptions   IBUPROFEN (MOTRIN IB) 200 MG TABLET    Take 1 tablet (200 mg total) by mouth 3 (three) times daily.     Marily Memos, MD 09/25/16 1316

## 2016-09-25 NOTE — ED Triage Notes (Addendum)
Patient complaining of lower right sided back pain off and on x 1 month, worsening today. States pain is worse with movement. States last bowel movement was this morning.

## 2016-11-20 NOTE — Progress Notes (Signed)
Patient ID: Charlene Vincent, female   DOB: [birth year], 82 y.o.   MRN: [id number]    Date:  11/25/2016   ID:  Charlene Vincent, DOB April 19, 1934, MRN [id number]  PCP:  Aletha Halim., PA-C  Primary Cardiologist:   Shawnn Bouillon [place] MD   Chief Complaint  Patient presents with  . Follow-up    pt denied chest pain, pt c/o back pain     History of Present Illness: Charlene Vincent is a 82 y.o. female is seen for follow up Takotsubo CM. In August 2016 she presented with a foot injury from a mechanical fall. Her foot was treated and a laceration was repaired. An ECG was performed and was significantly abnormal. Her troponin was elevated. The ECG met STEMI criteria and she was transferred emergently to [facility] and taken directly to the cath lab.    Left heart catheterization  10/02/2014 revealed normal coronary arteries.  LVEF was 25-35%.   Hyperdynamic base consistent with Takotsubo  Syndrome.   She had an echocardiogram on 10/03/2014 which confirmed an ejection fraction of 30-35%. There is akinesis of the mid apical anteroseptal, inferior and apical myocardium. Grade 2 diastolic dysfunction. Moderate LVH. She had a similar episode of stress induced cardiomyopathy in 2012. EF returned to normal following that episode. Her most recent Echo in October 2016 showed an EF 45-50%.   On follow up today she is doing very well. Denies any chest pain , dyspnea, palpitations, edema. No fatigue. She states she gets a lot of rest at home. Really not very active.   Wt Readings from Last 3 Encounters:  11/25/16 134 lb 6.4 oz (61 kg)  09/25/16 123 lb (55.8 kg)  05/02/16 132 lb (59.9 kg)     Past Medical History:  Diagnosis Date  . Anemia   . Arthritis   . Cellulitis   . Costochondritis   . Headache   . Hypothyroidism   . Myocardial infarction [facility]) 2012, 2016   both Takotsubo  . Pneumonia   . Shingles   . Takotsubo cardiomyopathy    a. 2012  b. LHC in 09/2014 nl cors, EF 30-35%>>45-50% by echo 11/2014    . Tremor, essential     Current Outpatient Prescriptions  Medication Sig Dispense Refill  . acetaminophen (TYLENOL) 500 MG tablet Take 1,000 mg by mouth every 6 (six) hours.    Marland Kitchen atorvastatin (LIPITOR) 80 MG tablet Take 80 mg by mouth daily.    . carvedilol (COREG) 3.125 MG tablet Take 1 tablet (3.125 mg total) by mouth 2 (two) times daily with a meal.    . doxepin (SINEQUAN) 10 MG capsule Take 10 mg by mouth daily as needed (itching).    . fluticasone (FLONASE) 50 MCG/ACT nasal spray Place 1-2 sprays into both nostrils daily.    Marland Kitchen levothyroxine (SYNTHROID, LEVOTHROID) 88 MCG tablet Take 88 mcg by mouth daily.    Marland Kitchen lisinopril (PRINIVIL,ZESTRIL) 2.5 MG tablet Take 1 tablet (2.5 mg total) by mouth daily.    Marland Kitchen oxyCODONE-acetaminophen (PERCOCET/ROXICET) 5-325 MG tablet Take 0.5-1 tablets by mouth daily. Alternates taking one-half tablet daily with one tablet daily     No current facility-administered medications for this visit.     Allergies:    Allergies  Allergen Reactions  . Aspirin     But taking low dose therapy without problems.  . Penicillins Rash    Social History:  The patient  reports that she quit smoking about 6 years ago. Her smoking use included Cigarettes.  She smoked 0.50 packs per day. She has never used smokeless tobacco. She reports that she does not drink alcohol or use drugs.   Family history:   Family History  Problem Relation Age of Onset  . Heart disease Father   . Heart disease Brother     ROS:  Please see the history of present illness.  All other systems reviewed and negative.   PHYSICAL EXAM: VS:  BP 124/72   Pulse 62   Ht '5\' 7"'  (1.702 m)   Wt 134 lb 6.4 oz (61 kg)   BMI 21.05 kg/m  GENERAL:  Well appearing, elderly in NAD HEENT:  PERRL, EOMI, sclera are clear. Oropharynx is clear. NECK:  No jugular venous distention, carotid upstroke brisk and symmetric, no bruits, no thyromegaly or adenopathy LUNGS:  Clear to auscultation bilaterally CHEST:   Unremarkable HEART:  RRR,  PMI not displaced or sustained,S1 and S2 within normal limits, no S3, no S4: no clicks, no rubs, no murmurs ABD:  Soft, nontender. BS +, no masses or bruits. No hepatomegaly, no splenomegaly EXT:  2 + pulses throughout, no edema, no cyanosis no clubbing SKIN:  Warm and dry.  No rashes NEURO:  Alert and oriented x 3. Cranial nerves II through XII intact. PSYCH:  Cognitively intact    Lipid Panel     Component Value Date/Time   CHOL 82 10/04/2014 0300   TRIG 67 10/04/2014 0300   HDL 32 (L) 10/04/2014 0300   CHOLHDL 2.6 10/04/2014 0300   VLDL 13 10/04/2014 0300   LDLCALC 37 10/04/2014 0300   Lab Results  Component Value Date   WBC 6.2 05/03/2016   HGB 12.4 05/03/2016   HCT 38.5 05/03/2016   PLT 155 05/03/2016   GLUCOSE 99 05/03/2016   CHOL 82 10/04/2014   TRIG 67 10/04/2014   HDL 32 (L) 10/04/2014   LDLCALC 37 10/04/2014   ALT 15 10/06/2014   AST 26 10/06/2014   NA 137 05/03/2016   K 4.0 05/03/2016   CL 102 05/03/2016   CREATININE 0.86 05/03/2016   BUN 17 05/03/2016   CO2 27 05/03/2016   TSH 4.635 (H) 10/07/2014   INR 0.97 09/10/2010   HGBA1C 5.6 09/10/2010   Ecg today shows NSR rate 62. Normal Ecg. I have personally reviewed and interpreted this study.    ASSESSMENT AND PLAN:  1. Takotsubo's cardiomyopathy. Recurrent. Initial episode in 2012 with recurrence in 2016. She is asymptomatic. Mild LV dysfunction noted on last Echo. Will continue low dose Coreg and lisinopril. Volume status is good. I will follow up in one year.

## 2016-11-25 ENCOUNTER — Encounter: Payer: Self-pay | Admitting: Cardiology

## 2016-11-25 ENCOUNTER — Ambulatory Visit (INDEPENDENT_AMBULATORY_CARE_PROVIDER_SITE_OTHER): Payer: Medicare Other | Admitting: Cardiology

## 2016-11-25 VITALS — BP 124/72 | HR 62 | Ht 67.0 in | Wt 134.4 lb

## 2016-11-25 DIAGNOSIS — I10 Essential (primary) hypertension: Secondary | ICD-10-CM | POA: Diagnosis not present

## 2016-11-25 DIAGNOSIS — I5181 Takotsubo syndrome: Secondary | ICD-10-CM | POA: Diagnosis not present

## 2016-11-25 NOTE — Patient Instructions (Signed)
Continue your current therapy  I will see you in one year   

## 2017-01-09 ENCOUNTER — Emergency Department (HOSPITAL_COMMUNITY)
Admission: EM | Admit: 2017-01-09 | Discharge: 2017-01-09 | Disposition: A | Discharge status: Home / Self Care | Payer: Medicare Other | Attending: Emergency Medicine | Admitting: Emergency Medicine

## 2017-01-09 ENCOUNTER — Encounter (HOSPITAL_COMMUNITY): Payer: Self-pay | Admitting: Emergency Medicine

## 2017-01-09 DIAGNOSIS — E039 Hypothyroidism, unspecified: Secondary | ICD-10-CM | POA: Insufficient documentation

## 2017-01-09 DIAGNOSIS — B3749 Other urogenital candidiasis: Secondary | ICD-10-CM | POA: Insufficient documentation

## 2017-01-09 DIAGNOSIS — I10 Essential (primary) hypertension: Secondary | ICD-10-CM | POA: Diagnosis not present

## 2017-01-09 DIAGNOSIS — Z79899 Other long term (current) drug therapy: Secondary | ICD-10-CM | POA: Insufficient documentation

## 2017-01-09 DIAGNOSIS — Z87891 Personal history of nicotine dependence: Secondary | ICD-10-CM | POA: Diagnosis not present

## 2017-01-09 DIAGNOSIS — L299 Pruritus, unspecified: Secondary | ICD-10-CM

## 2017-01-09 MED ORDER — NYSTATIN 100000 UNIT/GM EX CREA: TOPICAL_CREAM | CUTANEOUS | 1 refills | Status: DC

## 2017-01-09 NOTE — ED Provider Notes (Signed)
[facility] EMERGENCY DEPARTMENT Provider Note   CSN: [id number] Arrival date & time: 01/09/17  2025     History   Chief Complaint Chief Complaint  Patient presents with  . Pruritis    HPI Charlene Vincent is a 82 y.o. female.  HPI  The pt has had 2 weeks of itching intermittently - has been taking benadryl for the itching when it occurs in the morning and at night.  She reports that this evening when she had some itching, there was no associated rash but she was itching all over, took a Benadryl and it did not seem to help however at this time her itching is totally gone away.  She also reports that she feels like her hemorrhoids might be bleeding.  She denies any respiratory symptoms including wheezing coughing or shortness of breath.  No swelling of the throat.  Past Medical History:  Diagnosis Date  . Anemia   . Arthritis   . Cellulitis   . Costochondritis   . Headache   . Hypothyroidism   . Myocardial infarction [facility]) 2012, 2016   both Takotsubo  . Pneumonia   . Shingles   . Takotsubo cardiomyopathy    a. 2012  b. LHC in 09/2014 nl cors, EF 30-35%>>45-50% by echo 11/2014  . Tremor, essential     Patient Active Problem List   Diagnosis Date Noted  . Open wound of foot except toe(s) alone 01/22/2015  . Hyperlipidemia 10/25/2014  . STEMI (ST elevation myocardial infarction) (HCC) 10/09/2014  . Foot laceration   . Fever   . Acute MI (HCC) 10/02/2014  . Foot fracture 10/02/2014  . Stress-induced cardiomyopathy 10/02/2014  . Demand ischemia (HCC) 10/02/2014  . Takotsubo cardiomyopathy   . Hyponatremia 07/20/2011  . Leukopenia 07/20/2011  . Abscess or cellulitis of gluteal region 07/17/2011  . Hypothyroidism 07/17/2011  . Anemia 07/17/2011  . HTN (hypertension) 07/17/2011    Past Surgical History:  Procedure Laterality Date  . ABDOMINAL HYSTERECTOMY    . APPLICATION OF A-CELL TO LEFT THIGH Left 01/22/2015   Performed by Glenna Fellows, MD at [facility] OR    . APPLICATION OF WOUND VAC Left 10/09/2014   Performed by Cammy Copa, MD at [facility] OR  . CARDIAC CATHETERIZATION  September 10, 2010   EF 35 to 40%. No significant CAD. ? takotsubo cardiomyopathy  . DEBRIDEMENT LEFT FOOT Left 10/09/2014   Performed by Cammy Copa, MD at [facility] OR  . Debridement left foot, application integra, wound vacuum-assisted closure Left 10/12/2014   Performed by Glenna Fellows, MD at [facility] OR  . INGUINAL HERNIA REPAIR    . Left Heart Cath and Coronary Angiography N/A 10/02/2014   Performed by [place], Peter M, MD at [facility] INVASIVE CV LAB  . SKIN GRAFT Left 01/22/2015   left thigh to left foot  . SPLIT THICKNESS SKIN GRAFT FROM LEFT THIGH TO LEFT FOOT Left 01/22/2015   Performed by Glenna Fellows, MD at [facility] OR    OB History    No data available       Home Medications    Prior to Admission medications   Medication Sig Start Date End Date Taking? Authorizing Provider  acetaminophen (TYLENOL) 500 MG tablet Take 1,000 mg by mouth every 6 (six) hours.    [provider]  atorvastatin (LIPITOR) 80 MG tablet Take 80 mg by mouth daily.    [provider]  carvedilol (COREG) 3.125 MG tablet Take 1 tablet (3.125 mg total) by  mouth 2 (two) times daily with a meal. 10/12/14   Ghimire, Werner LeanShanker M, MD  doxepin (SINEQUAN) 10 MG capsule Take 10 mg by mouth daily as needed (itching).    [provider]  fluticasone (FLONASE) 50 MCG/ACT nasal spray Place 1-2 sprays into both nostrils daily. 09/02/16   [provider]  levothyroxine (SYNTHROID, LEVOTHROID) 88 MCG tablet Take 88 mcg by mouth daily. 04/08/16   [provider]  lisinopril (PRINIVIL,ZESTRIL) 2.5 MG tablet Take 1 tablet (2.5 mg total) by mouth daily. 10/12/14   Ghimire, Werner LeanShanker M, MD  nystatin cream (MYCOSTATIN) Apply to affected area 2 times daily 01/09/17   Eber HongMiller, Deondre Marinaro, MD  oxyCODONE-acetaminophen (PERCOCET/ROXICET) 5-325 MG tablet Take 0.5-1 tablets by mouth daily.  Alternates taking one-half tablet daily with one tablet daily 04/14/16   [provider]    Family History Family History  Problem Relation Age of Onset  . Heart disease Father   . Heart disease Brother     Social History Social History   Tobacco Use  . Smoking status: Former Smoker    Packs/day: 0.50    Types: Cigarettes    Last attempt to quit: 09/07/2010    Years since quitting: 6.3  . Smokeless tobacco: Never Used  Substance Use Topics  . Alcohol use: No  . Drug use: No     Allergies   Aspirin and Penicillins   Review of Systems Review of Systems  Constitutional: Negative for fever.  HENT: Negative for mouth sores, sore throat, trouble swallowing and voice change.   Respiratory: Negative for choking, shortness of breath and wheezing.   Gastrointestinal: Negative for nausea and vomiting.  Skin: Negative for rash.     Physical Exam Updated Vital Signs BP 140/70 (BP Location: Right Arm)   Pulse 71   Temp 98.4 F (36.9 C) (Oral)   Resp 18   Ht 5\' 6"  (1.676 m)   Wt 61.2 kg (135 lb)   SpO2 96%   BMI 21.79 kg/m   Physical Exam  Constitutional: She appears well-developed and well-nourished. No distress.  HENT:  Head: Normocephalic and atraumatic.  Mouth/Throat: Oropharynx is clear and moist. No oropharyngeal exudate.  Eyes: Conjunctivae and EOM are normal. Pupils are equal, round, and reactive to light. Right eye exhibits no discharge. Left eye exhibits no discharge. No scleral icterus.  Neck: Normal range of motion. Neck supple. No JVD present. No thyromegaly present.  Cardiovascular: Normal rate, regular rhythm, normal heart sounds and intact distal pulses. Exam reveals no gallop and no friction rub.  No murmur heard. Pulmonary/Chest: Effort normal and breath sounds normal. No respiratory distress. She has no wheezes. She has no rales.  Abdominal: Soft. Bowel sounds are normal. She exhibits no distension and no mass. There is no tenderness.    Musculoskeletal: Normal range of motion. She exhibits no edema or tenderness.  Lymphadenopathy:    She has no cervical adenopathy.  Neurological: She is alert. Coordination normal.  Skin: Skin is warm and dry. No rash noted. No erythema.  There is no urticarial rash to the skin, the patient states she is not itchy at all.  The patient does have a perineal rash which is erythematous with several satellite lesions around it in the perineal area surrounding the anus as well as leading up towards the vagina.  Chaperone present for this exam  Psychiatric: She has a normal mood and affect. Her behavior is normal.  Nursing note and vitals reviewed.    ED Treatments /  Results  Labs (all labs ordered are listed, but only abnormal results are displayed) Labs Reviewed - No data to display   Radiology No results found.  Procedures Procedures (including critical care time)  Medications Ordered in ED Medications - No data to display   Initial Impression / Assessment and Plan / ED Course  I have reviewed the triage vital signs and the nursing notes.  Pertinent labs & imaging results that were available during my care of the patient were reviewed by me and considered in my medical decision making (see chart for details).    I suspect the patient has candidal infection of the skin, no signs of urticaria, needs formal testing by an allergist, family doctor can organize this.  I suspect Benadryl is making her slightly dizzy as she states that after she takes that she gets a little lightheaded.  She is not feeling poor at this time.  She has no rash, no itching, no complaints  Final Clinical Impressions(s) / ED Diagnoses   Final diagnoses:  Candidiasis of perineum  Itching    ED Discharge Orders        Ordered    nystatin cream (MYCOSTATIN)     01/09/17 7846       Eber Hong, MD 01/09/17 2103

## 2017-01-09 NOTE — ED Triage Notes (Signed)
Pt reports she began itching over two weeks ago, was seen by PCP and told to take benadryl. States itching has not gotten any better, took Benadryl last at 1900. Denies SOB or throat swelling.

## 2017-01-09 NOTE — Discharge Instructions (Signed)
Apply the nystatin ointment to the skin in groin 2 times daily for 2 weeks -  Have your doctor recheck your skin in the next 3-4 days ER for worsening itching or any difficulty breathing.

## 2017-04-18 ENCOUNTER — Other Ambulatory Visit: Payer: Self-pay

## 2017-04-18 ENCOUNTER — Emergency Department (HOSPITAL_COMMUNITY)
Admission: EM | Admit: 2017-04-18 | Discharge: 2017-04-18 | Disposition: A | Discharge status: Home / Self Care | Payer: Medicare Other | Attending: Emergency Medicine | Admitting: Emergency Medicine

## 2017-04-18 ENCOUNTER — Encounter (HOSPITAL_COMMUNITY): Payer: Self-pay | Admitting: Emergency Medicine

## 2017-04-18 DIAGNOSIS — Z87891 Personal history of nicotine dependence: Secondary | ICD-10-CM | POA: Insufficient documentation

## 2017-04-18 DIAGNOSIS — I252 Old myocardial infarction: Secondary | ICD-10-CM | POA: Insufficient documentation

## 2017-04-18 DIAGNOSIS — Z79899 Other long term (current) drug therapy: Secondary | ICD-10-CM | POA: Diagnosis not present

## 2017-04-18 DIAGNOSIS — R21 Rash and other nonspecific skin eruption: Secondary | ICD-10-CM | POA: Diagnosis present

## 2017-04-18 DIAGNOSIS — E039 Hypothyroidism, unspecified: Secondary | ICD-10-CM | POA: Diagnosis not present

## 2017-04-18 DIAGNOSIS — L239 Allergic contact dermatitis, unspecified cause: Secondary | ICD-10-CM | POA: Diagnosis not present

## 2017-04-18 DIAGNOSIS — I10 Essential (primary) hypertension: Secondary | ICD-10-CM | POA: Insufficient documentation

## 2017-04-18 DIAGNOSIS — L309 Dermatitis, unspecified: Secondary | ICD-10-CM

## 2017-04-18 MED ORDER — PREDNISONE 20 MG PO TABS: 40.0000 mg | ORAL_TABLET | Freq: Every day | ORAL | 0 refills | Status: DC

## 2017-04-18 MED ORDER — DEXAMETHASONE SODIUM PHOSPHATE 4 MG/ML IJ SOLN
6.0000 mg | Freq: Once | INTRAMUSCULAR | Status: AC
  Administered 2017-04-18: 6 mg via INTRAMUSCULAR
  Filled 2017-04-18: qty 2

## 2017-04-18 NOTE — Discharge Instructions (Signed)
Continue taking one Benadryl capsule twice a day as needed for itching.  Start the prednisone prescription tomorrow.  Follow-up with the dermatologist listed in 1 week if not improving.

## 2017-04-18 NOTE — ED Notes (Signed)
Dr. Estell Harpin in room to assess pt

## 2017-04-18 NOTE — ED Triage Notes (Signed)
Patient c/o rash to chest, back, and legs. Patient states started x1 month ago on abd but rash "went away on it's on" and then reappeared on chest. Patient states itching. Denies any fevers. Patient using cortisone cream with no relief.

## 2017-04-18 NOTE — ED Provider Notes (Signed)
[facility] EMERGENCY DEPARTMENT Provider Note   CSN: [id number] Arrival date & time: 04/18/17  7829     History   Chief Complaint Chief Complaint  Patient presents with  . Rash    HPI Charlene Vincent is a 83 y.o. female.  HPI   Charlene Vincent is a 83 y.o. female who presents to the Emergency Department complaining of rash and itching to most of her body.  She noticed a red rash beginning on her abdomen and now spread to her chest, back, arms and legs.  Symptoms began one month ago.  Symptoms have been waxing and waning.  Improved initially after using OTC hydrocortisone cream and taking benadryl.  Now, rash is more persistent.  Itching worse when exposed to heat.  She denies exposure to new medications, hygiene products or new foods.  She denies fever, pain, swelling, shortness or breath or difficulty swallowing.   Past Medical History:  Diagnosis Date  . Anemia   . Arthritis   . Cellulitis   . Costochondritis   . Headache   . Hypothyroidism   . Myocardial infarction [facility]) 2012, 2016   both Takotsubo  . Pneumonia   . Shingles   . Takotsubo cardiomyopathy    a. 2012  b. LHC in 09/2014 nl cors, EF 30-35%>>45-50% by echo 11/2014  . Tremor, essential     Patient Active Problem List   Diagnosis Date Noted  . Open wound of foot except toe(s) alone 01/22/2015  . Hyperlipidemia 10/25/2014  . STEMI (ST elevation myocardial infarction) (HCC) 10/09/2014  . Foot laceration   . Fever   . Acute MI (HCC) 10/02/2014  . Foot fracture 10/02/2014  . Stress-induced cardiomyopathy 10/02/2014  . Demand ischemia (HCC) 10/02/2014  . Takotsubo cardiomyopathy   . Hyponatremia 07/20/2011  . Leukopenia 07/20/2011  . Abscess or cellulitis of gluteal region 07/17/2011  . Hypothyroidism 07/17/2011  . Anemia 07/17/2011  . HTN (hypertension) 07/17/2011    Past Surgical History:  Procedure Laterality Date  . ABDOMINAL HYSTERECTOMY    . APPLICATION OF A-CELL OF EXTREMITY Left  01/22/2015   Procedure: APPLICATION OF A-CELL TO LEFT THIGH ;  Surgeon: Glenna Fellows, MD;  Location: [facility] OR;  Service: Plastics;  Laterality: Left;  . APPLICATION OF WOUND VAC Left 10/09/2014   Procedure: APPLICATION OF WOUND VAC;  Surgeon: Cammy Copa, MD;  Location: [facility] OR;  Service: Orthopedics;  Laterality: Left;  . CARDIAC CATHETERIZATION  September 10, 2010   EF 35 to 40%. No significant CAD. ? takotsubo cardiomyopathy  . CARDIAC CATHETERIZATION N/A 10/02/2014   Procedure: Left Heart Cath and Coronary Angiography;  Surgeon: Peter M Swaziland, MD;  Location: [facility] INVASIVE CV LAB;  Service: Cardiovascular;  Laterality: N/A;  . I&D EXTREMITY Left 10/09/2014   Procedure: DEBRIDEMENT LEFT FOOT;  Surgeon: Cammy Copa, MD;  Location: [facility] OR;  Service: Orthopedics;  Laterality: Left;  . INGUINAL HERNIA REPAIR    . SKIN FULL THICKNESS GRAFT Left 10/12/2014   Procedure: Debridement left foot, application integra, wound vacuum-assisted closure;  Surgeon: Glenna Fellows, MD;  Location: [facility] OR;  Service: Plastics;  Laterality: Left;  . SKIN GRAFT Left 01/22/2015   left thigh to left foot  . SKIN SPLIT GRAFT Left 01/22/2015   Procedure:  SPLIT THICKNESS SKIN GRAFT FROM LEFT THIGH TO LEFT FOOT ;  Surgeon: Glenna Fellows, MD;  Location: [facility] OR;  Service: Plastics;  Laterality: Left;    OB History    No data available  Home Medications    Prior to Admission medications   Medication Sig Start Date End Date Taking? Authorizing Provider  acetaminophen (TYLENOL) 500 MG tablet Take 1,000 mg by mouth every 6 (six) hours.    [provider]  atorvastatin (LIPITOR) 80 MG tablet Take 80 mg by mouth daily.    [provider]  carvedilol (COREG) 3.125 MG tablet Take 1 tablet (3.125 mg total) by mouth 2 (two) times daily with a meal. 10/12/14   Ghimire, Werner Lean, MD  doxepin (SINEQUAN) 10 MG capsule Take 10 mg by mouth daily as needed (itching).    [provider]    fluticasone (FLONASE) 50 MCG/ACT nasal spray Place 1-2 sprays into both nostrils daily. 09/02/16   [provider]  levothyroxine (SYNTHROID, LEVOTHROID) 88 MCG tablet Take 88 mcg by mouth daily. 04/08/16   [provider]  lisinopril (PRINIVIL,ZESTRIL) 2.5 MG tablet Take 1 tablet (2.5 mg total) by mouth daily. 10/12/14   Ghimire, Werner Lean, MD  nystatin cream (MYCOSTATIN) Apply to affected area 2 times daily 01/09/17   Eber Hong, MD  oxyCODONE-acetaminophen (PERCOCET/ROXICET) 5-325 MG tablet Take 0.5-1 tablets by mouth daily. Alternates taking one-half tablet daily with one tablet daily 04/14/16   [provider]    Family History Family History  Problem Relation Age of Onset  . Heart disease Father   . Heart disease Brother     Social History Social History   Tobacco Use  . Smoking status: Former Smoker    Packs/day: 0.50    Types: Cigarettes    Last attempt to quit: 09/07/2010    Years since quitting: 6.6  . Smokeless tobacco: Never Used  Substance Use Topics  . Alcohol use: No  . Drug use: No     Allergies   Aspirin and Penicillins   Review of Systems Review of Systems  Constitutional: Negative for activity change, appetite change, chills and fever.  HENT: Negative for facial swelling, sore throat and trouble swallowing.   Respiratory: Negative for chest tightness, shortness of breath and wheezing.   Musculoskeletal: Negative for neck pain and neck stiffness.  Skin: Positive for rash. Negative for wound.  Neurological: Negative for dizziness, weakness, numbness and headaches.  All other systems reviewed and are negative.    Physical Exam Updated Vital Signs BP (!) 149/89 (BP Location: Right Arm)   Pulse 63   Temp 97.9 F (36.6 C) (Oral)   Resp 18   Ht 5\' 3"  (1.6 m)   Wt 61.2 kg (135 lb)   SpO2 99%   BMI 23.91 kg/m   Physical Exam  Constitutional: She is oriented to person, place, and time. She appears well-developed and  well-nourished. No distress.  HENT:  Head: Normocephalic and atraumatic.  Mouth/Throat: Oropharynx is clear and moist.  Neck: Normal range of motion. Neck supple.  Cardiovascular: Normal rate, regular rhythm and intact distal pulses.  No murmur heard. Pulmonary/Chest: Effort normal and breath sounds normal. No respiratory distress. She has no wheezes. She exhibits no tenderness.  Musculoskeletal: She exhibits no edema or tenderness.  Lymphadenopathy:    She has no cervical adenopathy.  Neurological: She is alert and oriented to person, place, and time. She exhibits normal muscle tone. Coordination normal.  Skin: Skin is warm. Capillary refill takes less than 2 seconds. Rash noted. There is erythema.  Erythematous, maculopapular rash to most of the body.  No drainage, crusting or edema.  Few excoriations also present.    Psychiatric: She has a  normal mood and affect.  Nursing note and vitals reviewed.    ED Treatments / Results  Labs (all labs ordered are listed, but only abnormal results are displayed) Labs Reviewed - No data to display  EKG  EKG Interpretation None       Radiology No results found.  Procedures Procedures (including critical care time)  Medications Ordered in ED Medications  dexamethasone (DECADRON) injection 6 mg (6 mg Intramuscular Given 04/18/17 1204)     Initial Impression / Assessment and Plan / ED Course  I have reviewed the triage vital signs and the nursing notes.  Pertinent labs & imaging results that were available during my care of the patient were reviewed by me and considered in my medical decision making (see chart for details).     Patient also seen by Dr. Estell Harpin and care plan discussed.  Rash appears c/w dermatitis.  Pt otherwise well appearing.  Airway patent.  Pt appears safe for d/c home, agrees to continue benadryl, steroids.  Dermatology referral also given.  Return precautions discussed  Final Clinical Impressions(s) / ED  Diagnoses   Final diagnoses:  Dermatitis  Allergic contact dermatitis, unspecified trigger    ED Discharge Orders    None       Rosey Bath 04/19/17 1248    Bethann Berkshire, MD 04/19/17 [phone]

## 2017-05-28 ENCOUNTER — Other Ambulatory Visit: Payer: Self-pay

## 2017-05-28 ENCOUNTER — Emergency Department (HOSPITAL_COMMUNITY)
Admission: EM | Admit: 2017-05-28 | Discharge: 2017-05-28 | Disposition: A | Discharge status: Home / Self Care | Payer: Medicare Other | Attending: Emergency Medicine | Admitting: Emergency Medicine

## 2017-05-28 ENCOUNTER — Encounter (HOSPITAL_COMMUNITY): Payer: Self-pay | Admitting: Emergency Medicine

## 2017-05-28 DIAGNOSIS — Z87891 Personal history of nicotine dependence: Secondary | ICD-10-CM | POA: Diagnosis not present

## 2017-05-28 DIAGNOSIS — Z79899 Other long term (current) drug therapy: Secondary | ICD-10-CM | POA: Diagnosis not present

## 2017-05-28 DIAGNOSIS — R21 Rash and other nonspecific skin eruption: Secondary | ICD-10-CM | POA: Diagnosis present

## 2017-05-28 DIAGNOSIS — L309 Dermatitis, unspecified: Secondary | ICD-10-CM | POA: Diagnosis not present

## 2017-05-28 DIAGNOSIS — I252 Old myocardial infarction: Secondary | ICD-10-CM | POA: Diagnosis not present

## 2017-05-28 DIAGNOSIS — E039 Hypothyroidism, unspecified: Secondary | ICD-10-CM | POA: Diagnosis not present

## 2017-05-28 DIAGNOSIS — I10 Essential (primary) hypertension: Secondary | ICD-10-CM | POA: Insufficient documentation

## 2017-05-28 MED ORDER — DEXAMETHASONE SODIUM PHOSPHATE 10 MG/ML IJ SOLN
6.0000 mg | Freq: Once | INTRAMUSCULAR | Status: AC
  Administered 2017-05-28: 6 mg via INTRAMUSCULAR
  Filled 2017-05-28: qty 1

## 2017-05-28 MED ORDER — DEXAMETHASONE 4 MG PO TABS: 4.0000 mg | ORAL_TABLET | Freq: Two times a day (BID) | ORAL | 0 refills | Status: DC

## 2017-05-28 NOTE — ED Provider Notes (Signed)
[facility] EMERGENCY DEPARTMENT Provider Note   CSN: [id number] Arrival date & time: 05/28/17  1800     History   Chief Complaint Chief Complaint  Patient presents with  . Rash    HPI Charlene Vincent is a 83 y.o. female.  Patient is an 83 year old female who presents to the emergency department with a complaint of a rash.  The patient states that she has had an itchy rash for over a year.  She states that the rash comes and goes, but it stays most of the time.  She notices the rash on her arms, on her abdomen on her chest and on her back.  The patient took Benadryl on last night and again today because the itching was so intense.  She says she has been evaluated in the emergency department in the past for this rash.  She was told to see a dermatologist, but has not done so as of yet.  She denies any shortness of breath or difficulty with her breathing.  She denies any swelling.  She denies any changes in her diet.  There is been no significant changes in her medications, the patient has not been out of the country recently, and has not been any new environments.  She presents now for assistance with this issue.     Past Medical History:  Diagnosis Date  . Anemia   . Arthritis   . Cellulitis   . Costochondritis   . Headache   . Hypothyroidism   . Myocardial infarction [facility]) 2012, 2016   both Takotsubo  . Pneumonia   . Shingles   . Takotsubo cardiomyopathy    a. 2012  b. LHC in 09/2014 nl cors, EF 30-35%>>45-50% by echo 11/2014  . Tremor, essential     Patient Active Problem List   Diagnosis Date Noted  . Open wound of foot except toe(s) alone 01/22/2015  . Hyperlipidemia 10/25/2014  . STEMI (ST elevation myocardial infarction) (HCC) 10/09/2014  . Foot laceration   . Fever   . Acute MI (HCC) 10/02/2014  . Foot fracture 10/02/2014  . Stress-induced cardiomyopathy 10/02/2014  . Demand ischemia (HCC) 10/02/2014  . Takotsubo cardiomyopathy   . Hyponatremia  07/20/2011  . Leukopenia 07/20/2011  . Abscess or cellulitis of gluteal region 07/17/2011  . Hypothyroidism 07/17/2011  . Anemia 07/17/2011  . HTN (hypertension) 07/17/2011    Past Surgical History:  Procedure Laterality Date  . ABDOMINAL HYSTERECTOMY    . APPLICATION OF A-CELL OF EXTREMITY Left 01/22/2015   Procedure: APPLICATION OF A-CELL TO LEFT THIGH ;  Surgeon: Glenna Fellows, MD;  Location: [facility] OR;  Service: Plastics;  Laterality: Left;  . APPLICATION OF WOUND VAC Left 10/09/2014   Procedure: APPLICATION OF WOUND VAC;  Surgeon: Cammy Copa, MD;  [facility] OR;  Service: Orthopedics;  Laterality: Left;  . CARDIAC CATHETERIZATION  September 10, 2010   EF 35 to 40%. No significant CAD. ? takotsubo cardiomyopathy  . CARDIAC CATHETERIZATION N/A 10/02/2014   Procedure: Left Heart Cath and Coronary Angiography;  Surgeon: Peter M Swaziland, MD;  Location: [facility] INVASIVE CV LAB;  Service: Cardiovascular;  Laterality: N/A;  . I&D EXTREMITY Left 10/09/2014   Procedure: DEBRIDEMENT LEFT FOOT;  Surgeon: Cammy Copa, MD;  Location: [facility] OR;  Service: Orthopedics;  Laterality: Left;  . INGUINAL HERNIA REPAIR    . SKIN FULL THICKNESS GRAFT Left 10/12/2014   Procedure: Debridement left foot, application integra, wound vacuum-assisted closure;  Surgeon: Glenna Fellows, MD;  Location: [facility] OR;  Service: Plastics;  Laterality: Left;  . SKIN GRAFT Left 01/22/2015   left thigh to left foot  . SKIN SPLIT GRAFT Left 01/22/2015   Procedure:  SPLIT THICKNESS SKIN GRAFT FROM LEFT THIGH TO LEFT FOOT ;  Surgeon: Glenna Fellows, MD;  Location: [facility] OR;  Service: Plastics;  Laterality: Left;     OB History   None      Home Medications    Prior to Admission medications   Medication Sig Start Date End Date Taking? Authorizing Provider  acetaminophen (TYLENOL) 500 MG tablet Take 1,000 mg by mouth every 6 (six) hours.   Yes [provider]  atorvastatin (LIPITOR) 80 MG tablet Take 80 mg by mouth  daily.   Yes [provider]  carvedilol (COREG) 3.125 MG tablet Take 1 tablet (3.125 mg total) by mouth 2 (two) times daily with a meal. 10/12/14  Yes Ghimire, Werner Lean, MD  doxepin (SINEQUAN) 10 MG capsule Take 10 mg by mouth daily as needed (itching).   Yes [provider]  fluticasone (FLONASE) 50 MCG/ACT nasal spray Place 1-2 sprays into both nostrils daily. 09/02/16  Yes [provider]  levothyroxine (SYNTHROID, LEVOTHROID) 88 MCG tablet Take 88 mcg by mouth daily. 04/08/16  Yes [provider]  oxyCODONE-acetaminophen (PERCOCET/ROXICET) 5-325 MG tablet Take 0.5-1 tablets by mouth daily. Alternates taking one-half tablet daily with one tablet daily 04/14/16  Yes [provider]  lisinopril (PRINIVIL,ZESTRIL) 2.5 MG tablet Take 1 tablet (2.5 mg total) by mouth daily. 10/12/14   Ghimire, Werner Lean, MD  nystatin cream (MYCOSTATIN) Apply to affected area 2 times daily 01/09/17   Eber Hong, MD  predniSONE (DELTASONE) 20 MG tablet Take 2 tablets (40 mg total) by mouth daily. 04/18/17   Pauline Aus, PA-C    Family History Family History  Problem Relation Age of Onset  . Heart disease Father   . Heart disease Brother     Social History Social History   Tobacco Use  . Smoking status: Former Smoker    Packs/day: 0.50    Types: Cigarettes    Last attempt to quit: 09/07/2010    Years since quitting: 6.7  . Smokeless tobacco: Never Used  Substance Use Topics  . Alcohol use: No  . Drug use: No     Allergies   Aspirin and Penicillins   Review of Systems Review of Systems  Constitutional: Negative for activity change.       All ROS Neg except as noted in HPI  HENT: Negative for nosebleeds.   Eyes: Negative for photophobia and discharge.  Respiratory: Negative for cough, shortness of breath and wheezing.   Cardiovascular: Negative for chest pain and palpitations.  Gastrointestinal: Negative for abdominal pain and blood in stool.    Genitourinary: Negative for dysuria, frequency and hematuria.  Musculoskeletal: Negative for arthralgias, back pain and neck pain.  Skin: Positive for rash.  Neurological: Negative for dizziness, seizures and speech difficulty.  Psychiatric/Behavioral: Negative for confusion and hallucinations.     Physical Exam Updated Vital Signs BP (!) 149/78 (BP Location: Right Arm)   Pulse 67   Temp 98.2 F (36.8 C) (Oral)   Resp 15   Ht 5\' 7"  (1.702 m)   Wt 61.2 kg (135 lb)   SpO2 96%   BMI 21.14 kg/m   Physical Exam  Constitutional: She is oriented to person, place, and time. She appears well-developed and well-nourished.  Non-toxic appearance.  HENT:  Head: Normocephalic.  Right  Ear: Tympanic membrane and external ear normal.  Left Ear: Tympanic membrane and external ear normal.  Eyes: Pupils are equal, round, and reactive to light. EOM and lids are normal.  Neck: Normal range of motion. Neck supple. Carotid bruit is not present.  Cardiovascular: Normal rate, regular rhythm, normal heart sounds, intact distal pulses and normal pulses.  Pulmonary/Chest: Breath sounds normal. No respiratory distress. She has no wheezes. She has no rales.  Abdominal: Soft. Bowel sounds are normal. There is no tenderness. There is no guarding.  Musculoskeletal: Normal range of motion.  Lymphadenopathy:       Head (right side): No submandibular adenopathy present.       Head (left side): No submandibular adenopathy present.    She has no cervical adenopathy.  Neurological: She is alert and oriented to person, place, and time. She has normal strength. No cranial nerve deficit or sensory deficit.  Skin: Skin is warm and dry. Rash noted.  Maculopapular rash noted on the trunk anteriorly and posteriorly.  There is also some lesions on the arms.  No red streaking appreciated.  No drainage from the areas.  No lesions in the mouth, and no lesions in the palms of the hands.  Psychiatric: She has a normal mood and  affect. Her speech is normal.  Nursing note and vitals reviewed.    ED Treatments / Results  Labs (all labs ordered are listed, but only abnormal results are displayed) Labs Reviewed - No data to display  EKG None  Radiology No results found.  Procedures Procedures (including critical care time)  Medications Ordered in ED Medications - No data to display   Initial Impression / Assessment and Plan / ED Course  I have reviewed the triage vital signs and the nursing notes.  Pertinent labs & imaging results that were available during my care of the patient were reviewed by me and considered in my medical decision making (see chart for details).       Final Clinical Impressions(s) / ED Diagnoses MDM  Vital signs are within normal limits.  Pulse oximetry is 96% on room air.  Within normal limits by my interpretation.  I reviewed the previous emergency department records.  The rash noted today is very similar to rash seen previously in the emergency department.  I have advised the patient that she has a dermatitis.  There are no new changes that would be the usual triggers such as changes in medicine, diet, environment, clothing.  I have asked the patient to see Ms. Register for dermatology evaluation.  Patient was given an injection of steroid medication.  Prescription for steroid given.  I offered the patient Benadryl, but she says she has Benadryl at home and would like to wait until she gets home to take her Benadryl.  Questions were answered.  Feel that it is safe for the patient to be discharged home.   Final diagnoses:  Dermatitis    ED Discharge Orders    None       Ivery Quale, Cordelia Poche 05/28/17 Janeth Rase, MD 05/28/17 [id number]

## 2017-05-28 NOTE — ED Triage Notes (Signed)
Pt has an itchy rash all over body. States she has had it for over a year, but cannot stand the itching anymore. Denies any changes in medications or new detergents.

## 2017-05-28 NOTE — Discharge Instructions (Signed)
Your examination reveals a rash over multiple areas of the body.  Your breathing and oxygen level are all normal.  There is no wheezing or fluid in your lungs.  You were treated with a steroid injection tonight.  Please use Decadron 2 times daily.  Please use Benadryl every 6 hours as needed for itching.  Please see Dr. Register for dermatology evaluation of this ongoing rash.

## 2017-07-15 IMAGING — CR DG CHEST 1V PORT
1 series · 1 of 1 positions shown · non-contrast
Comparison: 09/11/2010

CLINICAL DATA: Fever

EXAM:
PORTABLE CHEST - 1 VIEW

[AP]
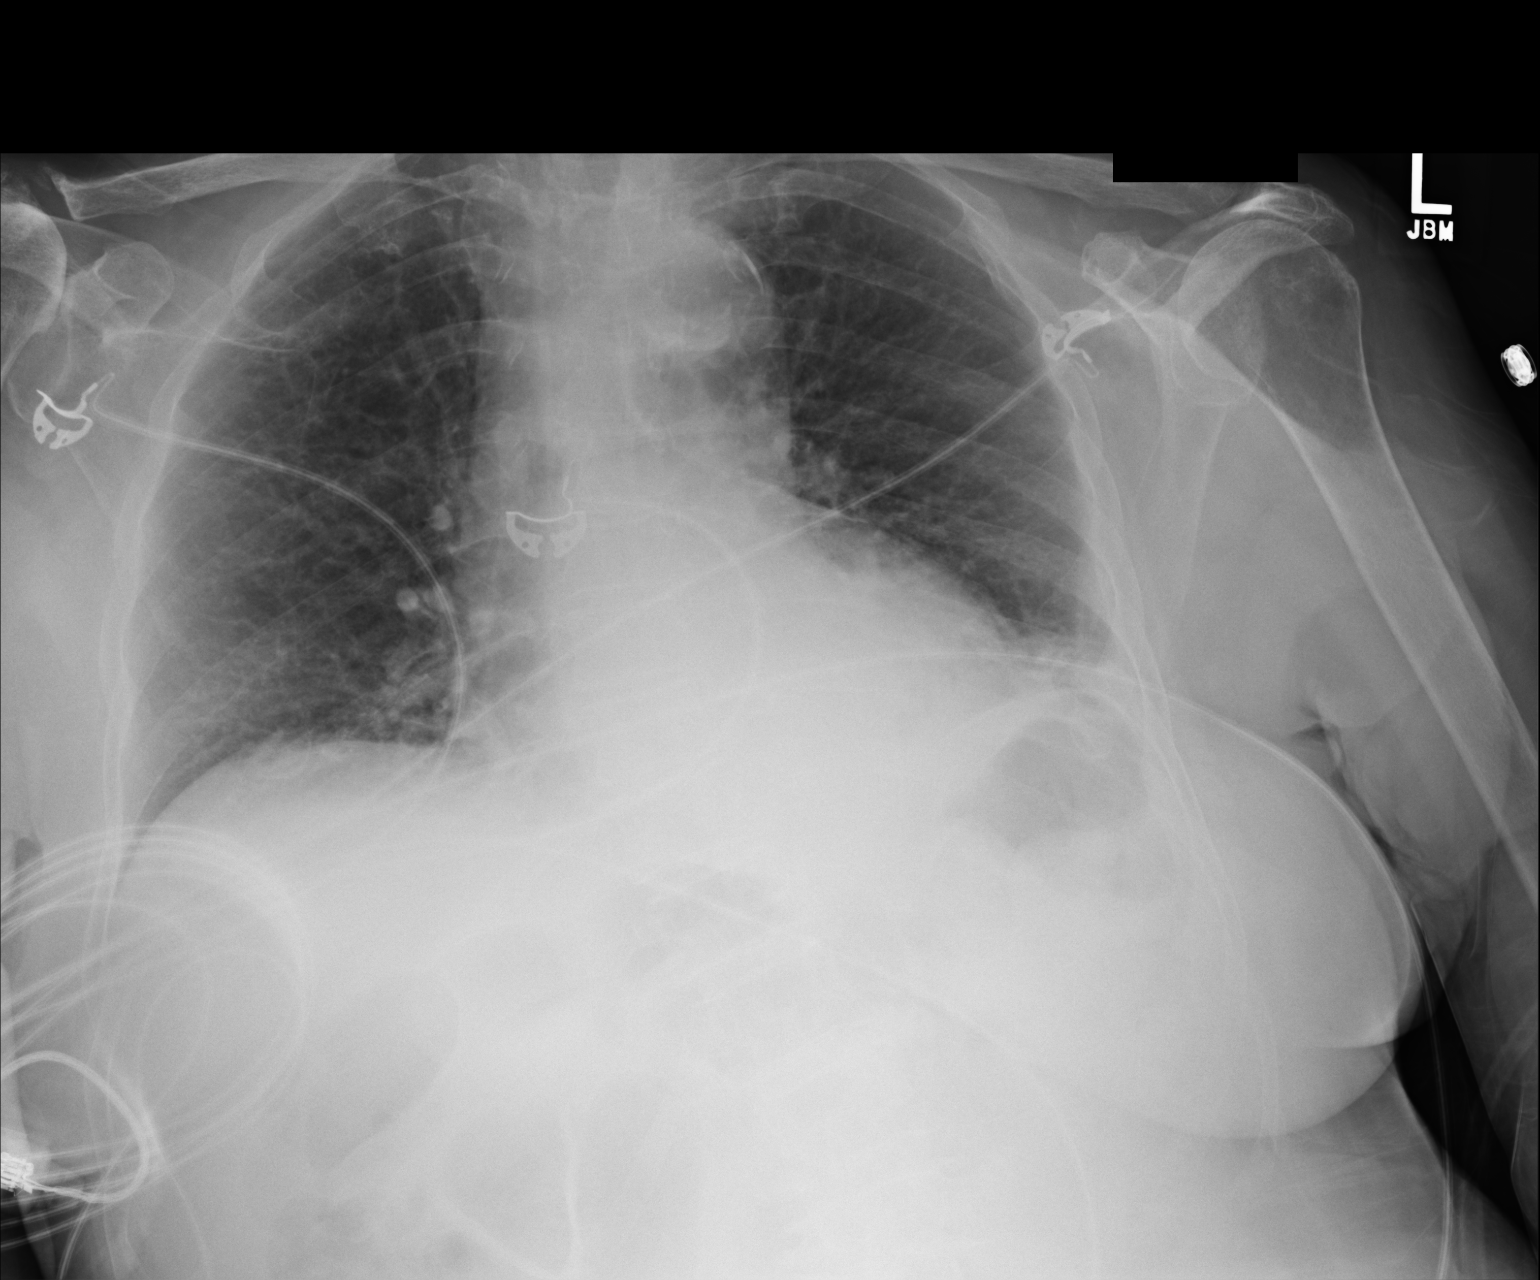

[1 of 1 positions shown; findings below may reference images not displayed]

FINDINGS: Cardiomediastinal silhouette is stable. No pulmonary edema. There is
left basilar atelectasis or infiltrate. Degenerative changes
bilateral shoulders. Atherosclerotic calcifications of thoracic
aorta.
IMPRESSION: No pulmonary edema.  Left basilar atelectasis or infiltrate.

## 2017-11-14 ENCOUNTER — Emergency Department (HOSPITAL_COMMUNITY): Payer: Medicare Other

## 2017-11-14 ENCOUNTER — Encounter (HOSPITAL_COMMUNITY): Payer: Self-pay

## 2017-11-14 ENCOUNTER — Emergency Department (HOSPITAL_COMMUNITY)
Admission: EM | Admit: 2017-11-14 | Discharge: 2017-11-14 | Disposition: A | Discharge status: Home / Self Care | Payer: Medicare Other | Attending: Emergency Medicine | Admitting: Emergency Medicine

## 2017-11-14 ENCOUNTER — Other Ambulatory Visit: Payer: Self-pay

## 2017-11-14 DIAGNOSIS — I252 Old myocardial infarction: Secondary | ICD-10-CM | POA: Insufficient documentation

## 2017-11-14 DIAGNOSIS — E039 Hypothyroidism, unspecified: Secondary | ICD-10-CM | POA: Diagnosis not present

## 2017-11-14 DIAGNOSIS — Z79899 Other long term (current) drug therapy: Secondary | ICD-10-CM | POA: Diagnosis not present

## 2017-11-14 DIAGNOSIS — I10 Essential (primary) hypertension: Secondary | ICD-10-CM | POA: Insufficient documentation

## 2017-11-14 DIAGNOSIS — K5900 Constipation, unspecified: Secondary | ICD-10-CM | POA: Diagnosis not present

## 2017-11-14 DIAGNOSIS — Z87891 Personal history of nicotine dependence: Secondary | ICD-10-CM | POA: Insufficient documentation

## 2017-11-14 DIAGNOSIS — R1084 Generalized abdominal pain: Secondary | ICD-10-CM | POA: Diagnosis present

## 2017-11-14 LAB — CBC WITH DIFFERENTIAL/PLATELET
BASOS ABS: 0 10*3/uL (ref 0.0–0.1)
BASOS PCT: 1 %
Eosinophils Absolute: 0.1 10*3/uL (ref 0.0–0.7)
Eosinophils Relative: 3 %
HEMATOCRIT: 38.7 % (ref 36.0–46.0)
Hemoglobin: 12.2 g/dL (ref 12.0–15.0)
LYMPHS PCT: 35 %
Lymphs Abs: 1.5 10*3/uL (ref 0.7–4.0)
MCH: 29.8 pg (ref 26.0–34.0)
MCHC: 31.5 g/dL (ref 30.0–36.0)
MCV: 94.6 fL (ref 78.0–100.0)
MONO ABS: 0.7 10*3/uL (ref 0.1–1.0)
MONOS PCT: 17 %
NEUTROS PCT: 44 %
Neutro Abs: 2 10*3/uL (ref 1.7–7.7)
Platelets: 150 10*3/uL (ref 150–400)
RBC: 4.09 MIL/uL (ref 3.87–5.11)
RDW: 13.9 % (ref 11.5–15.5)
WBC: 4.4 10*3/uL (ref 4.0–10.5)

## 2017-11-14 LAB — URINALYSIS, ROUTINE W REFLEX MICROSCOPIC
BILIRUBIN URINE: NEGATIVE
Glucose, UA: NEGATIVE mg/dL
HGB URINE DIPSTICK: NEGATIVE
KETONES UR: NEGATIVE mg/dL
Leukocytes, UA: NEGATIVE
NITRITE: NEGATIVE
PROTEIN: NEGATIVE mg/dL
Specific Gravity, Urine: 1.005 (ref 1.005–1.030)
pH: 6 (ref 5.0–8.0)

## 2017-11-14 LAB — COMPREHENSIVE METABOLIC PANEL
ALBUMIN: 3.8 g/dL (ref 3.5–5.0)
ALT: 36 U/L (ref 0–44)
ANION GAP: 9 (ref 5–15)
AST: 38 U/L (ref 15–41)
Alkaline Phosphatase: 60 U/L (ref 38–126)
BUN: 13 mg/dL (ref 8–23)
CALCIUM: 9 mg/dL (ref 8.9–10.3)
CO2: 27 mmol/L (ref 22–32)
Chloride: 105 mmol/L (ref 98–111)
Creatinine, Ser: 0.81 mg/dL (ref 0.44–1.00)
GFR calc non Af Amer: >60 mL/min (ref >60)
GLUCOSE: 98 mg/dL (ref 70–99)
POTASSIUM: 2.9 mmol/L — AB (ref 3.5–5.1)
SODIUM: 141 mmol/L (ref 135–145)
TOTAL PROTEIN: 6.9 g/dL (ref 6.5–8.1)
Total Bilirubin: 0.4 mg/dL (ref 0.3–1.2)

## 2017-11-14 LAB — LIPASE, BLOOD: Lipase: 49 U/L (ref 11–51)

## 2017-11-14 MED ORDER — IOPAMIDOL (ISOVUE-300) INJECTION 61%
100.0000 mL | Freq: Once | INTRAVENOUS | Status: AC | PRN
  Administered 2017-11-14: 100 mL via INTRAVENOUS

## 2017-11-14 NOTE — ED Provider Notes (Signed)
[facility] EMERGENCY DEPARTMENT Provider Note   CSN: [id number] Arrival date & time: 11/14/17  0106     History   Chief Complaint Chief Complaint  Patient presents with  . Abdominal Pain    HPI Charlene Vincent is a 83 y.o. female.  Patient is an 83 year old female with history of coronary artery disease, arthritis, anemia.  She presents today for evaluation of abdominal pain.  This started approximately 2 hours prior to presentation.  She reports the pain beginning when she was attempting to have a bowel movement.  She says she passed a small amount of stool and her abdomen is been hurting since.  She is most tender in the periumbilical region.  There are no fevers or chills.  She denies any vomiting.  She denies any bloody stool.  The history is provided by the patient.  Abdominal Pain   This is a new problem. Episode onset: 2 hours ago. The problem occurs constantly. The problem has been rapidly worsening. The pain is associated with an unknown factor. The pain is located in the generalized abdominal region. The quality of the pain is cramping. The pain is moderate. Associated symptoms include constipation. Pertinent negatives include anorexia, fever, hematochezia, dysuria, frequency and hematuria. The symptoms are aggravated by certain positions. Nothing relieves the symptoms.    Past Medical History:  Diagnosis Date  . Anemia   . Arthritis   . Cellulitis   . Costochondritis   . Headache   . Hypothyroidism   . Myocardial infarction [facility] 2012, 2016   both Takotsubo  . Pneumonia   . Shingles   . Takotsubo cardiomyopathy    a. 2012  b. LHC in 09/2014 nl cors, EF 30-35%>>45-50% by echo 11/2014  . Tremor, essential     Patient Active Problem List   Diagnosis Date Noted  . Open wound of foot except toe(s) alone 01/22/2015  . Hyperlipidemia 10/25/2014  . STEMI (ST elevation myocardial infarction) (HCC) 10/09/2014  . Foot laceration   . Fever   . Acute MI (HCC)  10/02/2014  . Foot fracture 10/02/2014  . Stress-induced cardiomyopathy 10/02/2014  . Demand ischemia (HCC) 10/02/2014  . Takotsubo cardiomyopathy   . Hyponatremia 07/20/2011  . Leukopenia 07/20/2011  . Abscess or cellulitis of gluteal region 07/17/2011  . Hypothyroidism 07/17/2011  . Anemia 07/17/2011  . HTN (hypertension) 07/17/2011    Past Surgical History:  Procedure Laterality Date  . ABDOMINAL HYSTERECTOMY    . APPLICATION OF A-CELL OF EXTREMITY Left 01/22/2015   Procedure: APPLICATION OF A-CELL TO LEFT THIGH ;  Surgeon: Glenna Fellows, MD;  Location: [facility] OR;  Service: Plastics;  Laterality: Left;  . APPLICATION OF WOUND VAC Left 10/09/2014   Procedure: APPLICATION OF WOUND VAC;  Surgeon: Cammy Copa, MD;  Location: [facility] OR;  Service: Orthopedics;  Laterality: Left;  . CARDIAC CATHETERIZATION  September 10, 2010   EF 35 to 40%. No significant CAD. ? takotsubo cardiomyopathy  . CARDIAC CATHETERIZATION N/A 10/02/2014   Procedure: Left Heart Cath and Coronary Angiography;  Surgeon: Peter M Swaziland, MD;  Location: [facility] INVASIVE CV LAB;  Service: Cardiovascular;  Laterality: N/A;  . I&D EXTREMITY Left 10/09/2014   Procedure: DEBRIDEMENT LEFT FOOT;  Surgeon: Cammy Copa, MD;  Location: [facility] OR;  Service: Orthopedics;  Laterality: Left;  . INGUINAL HERNIA REPAIR    . SKIN FULL THICKNESS GRAFT Left 10/12/2014   Procedure: Debridement left foot, application integra, wound vacuum-assisted closure;  Surgeon: Glenna Fellows, MD;  Location:  [facility] OR;  Service: Plastics;  Laterality: Left;  . SKIN GRAFT Left 01/22/2015   left thigh to left foot  . SKIN SPLIT GRAFT Left 01/22/2015   Procedure:  SPLIT THICKNESS SKIN GRAFT FROM LEFT THIGH TO LEFT FOOT ;  Surgeon: Glenna Fellows, MD;  Location: [facility] OR;  Service: Plastics;  Laterality: Left;     OB History   None      Home Medications    Prior to Admission medications   Medication Sig Start Date End Date Taking? Authorizing Provider    acetaminophen (TYLENOL) 500 MG tablet Take 1,000 mg by mouth every 6 (six) hours.    [provider]  atorvastatin (LIPITOR) 80 MG tablet Take 80 mg by mouth daily.    [provider]  carvedilol (COREG) 3.125 MG tablet Take 1 tablet (3.125 mg total) by mouth 2 (two) times daily with a meal. 10/12/14   Ghimire, Werner Lean, MD  dexamethasone (DECADRON) 4 MG tablet Take 1 tablet (4 mg total) by mouth 2 (two) times daily with a meal. 05/28/17   Ivery Quale, PA-C  doxepin (SINEQUAN) 10 MG capsule Take 10 mg by mouth daily as needed (itching).    [provider]  fluticasone (FLONASE) 50 MCG/ACT nasal spray Place 1-2 sprays into both nostrils daily. 09/02/16   [provider]  levothyroxine (SYNTHROID, LEVOTHROID) 88 MCG tablet Take 88 mcg by mouth daily. 04/08/16   [provider]  lisinopril (PRINIVIL,ZESTRIL) 2.5 MG tablet Take 1 tablet (2.5 mg total) by mouth daily. 10/12/14   Ghimire, Werner Lean, MD  nystatin cream (MYCOSTATIN) Apply to affected area 2 times daily 01/09/17   Eber Hong, MD  oxyCODONE-acetaminophen (PERCOCET/ROXICET) 5-325 MG tablet Take 0.5-1 tablets by mouth daily. Alternates taking one-half tablet daily with one tablet daily 04/14/16   [provider]  predniSONE (DELTASONE) 20 MG tablet Take 2 tablets (40 mg total) by mouth daily. 04/18/17   Pauline Aus, PA-C    Family History Family History  Problem Relation Age of Onset  . Heart disease Father   . Heart disease Brother     Social History Social History   Tobacco Use  . Smoking status: Former Smoker    Packs/day: 0.50    Types: Cigarettes    Last attempt to quit: 09/07/2010    Years since quitting: 7.1  . Smokeless tobacco: Never Used  Substance Use Topics  . Alcohol use: No  . Drug use: No     Allergies   Aspirin and Penicillins   Review of Systems Review of Systems  Constitutional: Negative for fever.  Gastrointestinal: Positive for abdominal pain  and constipation. Negative for anorexia and hematochezia.  Genitourinary: Negative for dysuria, frequency and hematuria.  All other systems reviewed and are negative.    Physical Exam Updated Vital Signs BP (!) 145/76 (BP Location: Left Arm)   Pulse 68   Temp (!) 97.5 F (36.4 C) (Oral)   Resp 18   Ht 5\' 5"  (1.651 m)   Wt 61.2 kg   SpO2 94%   BMI 22.47 kg/m   Physical Exam  Constitutional: She is oriented to person, place, and time. She appears well-developed and well-nourished. No distress.  HENT:  Head: Normocephalic and atraumatic.  Neck: Normal range of motion. Neck supple.  Cardiovascular: Normal rate and regular rhythm. Exam reveals no gallop and no friction rub.  No murmur heard. Pulmonary/Chest: Effort normal and breath sounds normal. No respiratory distress. She has no wheezes.  Abdominal: Soft.  Bowel sounds are normal. She exhibits no distension. There is generalized tenderness. There is no rigidity, no rebound and no guarding.  There is generalized abdominal tenderness.  Musculoskeletal: Normal range of motion.  Neurological: She is alert and oriented to person, place, and time.  Skin: Skin is warm and dry. She is not diaphoretic.  Nursing note and vitals reviewed.    ED Treatments / Results  Labs (all labs ordered are listed, but only abnormal results are displayed) Labs Reviewed  COMPREHENSIVE METABOLIC PANEL  CBC WITH DIFFERENTIAL/PLATELET  LIPASE, BLOOD  URINALYSIS, ROUTINE W REFLEX MICROSCOPIC    EKG None  Radiology No results found.  Procedures Procedures (including critical care time)  Medications Ordered in ED Medications - No data to display   Initial Impression / Assessment and Plan / ED Course  I have reviewed the triage vital signs and the nursing notes.  Pertinent labs & imaging results that were available during my care of the patient were reviewed by me and considered in my medical decision making (see chart for  details).  Patient presents with complaints of abdominal pain.  Her CT scan shows diffuse stool throughout the colon, however no fecal impaction or other acute intra-abdominal process.  She will be discharged with magnesium citrate and is to follow-up with primary doctor as needed.  Final Clinical Impressions(s) / ED Diagnoses   Final diagnoses:  None    ED Discharge Orders    None       Geoffery Lyons, MD 11/14/17 [id number]

## 2017-11-14 NOTE — ED Triage Notes (Addendum)
Abdominal pain x 2hrs. Had a "small ball" for a bowl movement.

## 2017-11-14 NOTE — Discharge Instructions (Addendum)
Magnesium citrate: Drink an entire 10 ounce bottle mixed with equal parts Sprite or Gatorade for relief of constipation.  Return to the emergency department for worsening pain, high fever, bloody stools, or other new and concerning symptoms.

## 2017-12-18 NOTE — Progress Notes (Signed)
Patient ID: Charlene Vincent, female   DOB: 1934-09-07, 83 y.o.   MRN: [id number]    Date:  12/28/2017   ID:  Charlene Vincent, DOB [birth year], MRN [id number]  PCP:  Charlene Halim., PA-C  Primary Cardiologist:   Ran Tullis Martinique MD   Chief Complaint  Patient presents with  . Congestive Heart Failure     History of Present Illness: Charlene Vincent is a 83 y.o. female is seen for follow up Takotsubo CM. In August 2016 she presented with a foot injury from a mechanical fall. Her foot was treated and a laceration was repaired. An ECG was performed and was significantly abnormal. Her troponin was elevated. The ECG met STEMI criteria and she was transferred emergently to [facility] and taken directly to the cath lab.    Left heart catheterization  10/02/2014 revealed normal coronary arteries.  LVEF was 25-35%.   Hyperdynamic base consistent with Takotsubo  Syndrome.   She had an echocardiogram on 10/03/2014 which confirmed an ejection fraction of 30-35%. There is akinesis of the mid apical anteroseptal, inferior and apical myocardium. Grade 2 diastolic dysfunction. Moderate LVH. She had a similar episode of stress induced cardiomyopathy in 2012. EF returned to normal following that episode. Her most recent Echo in October 2016 showed an EF 45-50%.   She was seen in the ED 2 months ago with abdominal pain. CT just showed a lot of stool and laxative recommended. Her potassium was low at 2.9. This has not been repeated.   On follow up today she is doing very well. Denies any chest pain , dyspnea, palpitations, edema. No fatigue. She states she walks daily and eats a healthy diet.   Wt Readings from Last 3 Encounters:  12/28/17 131 lb 6.4 oz (59.6 kg)  11/14/17 135 lb (61.2 kg)  05/28/17 135 lb (61.2 kg)     Past Medical History:  Diagnosis Date  . Anemia   . Arthritis   . Cellulitis   . Costochondritis   . Headache   . Hypothyroidism   . Myocardial infarction [facility]) 2012, 2016   both  Takotsubo  . Pneumonia   . Shingles   . Takotsubo cardiomyopathy    a. 2012  b. LHC in 09/2014 nl cors, EF 30-35%>>45-50% by echo 11/2014  . Tremor, essential     Current Outpatient Medications  Medication Sig Dispense Refill  . acetaminophen (TYLENOL) 500 MG tablet Take 1,000 mg by mouth every 6 (six) hours.    Marland Kitchen atorvastatin (LIPITOR) 80 MG tablet Take 80 mg by mouth daily.    . carvedilol (COREG) 3.125 MG tablet Take 1 tablet (3.125 mg total) by mouth 2 (two) times daily with a meal.    . dexamethasone (DECADRON) 4 MG tablet Take 1 tablet (4 mg total) by mouth 2 (two) times daily with a meal. 10 tablet 0  . doxepin (SINEQUAN) 10 MG capsule Take 10 mg by mouth daily as needed (itching).    . fluticasone (FLONASE) 50 MCG/ACT nasal spray Place 1-2 sprays into both nostrils daily.    Marland Kitchen levothyroxine (SYNTHROID, LEVOTHROID) 88 MCG tablet Take 88 mcg by mouth daily.    Marland Kitchen lisinopril (PRINIVIL,ZESTRIL) 2.5 MG tablet Take 1 tablet (2.5 mg total) by mouth daily.    Marland Kitchen oxyCODONE-acetaminophen (PERCOCET/ROXICET) 5-325 MG tablet Take 0.5-1 tablets by mouth daily. Alternates taking one-half tablet daily with one tablet daily    . predniSONE (DELTASONE) 20 MG tablet Take 2 tablets (40 mg total) by mouth daily.  10 tablet 0   No current facility-administered medications for this visit.     Allergies:    Allergies  Allergen Reactions  . Aspirin     But taking low dose therapy without problems.  . Nystatin Itching    She said when she used the cream it made symptoms worse   . Penicillins Rash    Social History:  The patient  reports that she quit smoking about 7 years ago. Her smoking use included cigarettes. She smoked 0.50 packs per day. She has never used smokeless tobacco. She reports that she does not drink alcohol or use drugs.   Family history:   Family History  Problem Relation Age of Onset  . Heart disease Father   . Heart disease Brother     ROS:  Please see the history of present  illness.  All other systems reviewed and negative.   PHYSICAL EXAM: VS:  BP (!) 120/58   Pulse 77   Ht '5\' 3"'  (1.6 m)   Wt 131 lb 6.4 oz (59.6 kg)   BMI 23.28 kg/m  GENERAL:  Well appearing EF in NAD HEENT:  PERRL, EOMI, sclera are clear. Oropharynx is clear. NECK:  No jugular venous distention, carotid upstroke brisk and symmetric, no bruits, no thyromegaly or adenopathy LUNGS:  Clear to auscultation bilaterally CHEST:  Unremarkable HEART:  RRR,  PMI not displaced or sustained,S1 and S2 within normal limits, no S3, no S4: no clicks, no rubs, no murmurs ABD:  Soft, nontender. BS +, no masses or bruits. No hepatomegaly, no splenomegaly EXT:  2 + pulses throughout, no edema, no cyanosis no clubbing SKIN:  Warm and dry.  No rashes NEURO:  Alert and oriented x 3. Cranial nerves II through XII intact. PSYCH:  Cognitively intact      Lipid Panel     Component Value Date/Time   CHOL 82 10/04/2014 0300   TRIG 67 10/04/2014 0300   HDL 32 (L) 10/04/2014 0300   CHOLHDL 2.6 10/04/2014 0300   VLDL 13 10/04/2014 0300   LDLCALC 37 10/04/2014 0300   Lab Results  Component Value Date   WBC 4.4 11/14/2017   HGB 12.2 11/14/2017   HCT 38.7 11/14/2017   PLT 150 11/14/2017   GLUCOSE 98 11/14/2017   CHOL 82 10/04/2014   TRIG 67 10/04/2014   HDL 32 (L) 10/04/2014   LDLCALC 37 10/04/2014   ALT 36 11/14/2017   AST 38 11/14/2017   NA 141 11/14/2017   K 2.9 (L) 11/14/2017   CL 105 11/14/2017   CREATININE 0.81 11/14/2017   BUN 13 11/14/2017   CO2 27 11/14/2017   TSH 4.635 (H) 10/07/2014   INR 0.97 09/10/2010   HGBA1C 5.6 09/10/2010   Ecg today shows NSR rate 77. Normal Ecg. I have personally reviewed and interpreted this study.   Echo 12/06/14: Study Conclusions  - Left ventricle: The cavity size was normal. There was mild focal   basal hypertrophy of the septum. Systolic function was mildly   reduced. The estimated ejection fraction was in the range of 45%   to 50%. Wall motion  was normal; there were no regional wall   motion abnormalities. Doppler parameters are consistent with   abnormal left ventricular relaxation (grade 1 diastolic   dysfunction). There was no evidence of elevated ventricular   filling pressure by Doppler parameters. - Aortic valve: Trileaflet; normal thickness leaflets. There was no   regurgitation. - Aortic root: The aortic root was normal in  size. - Mitral valve: There was no regurgitation. - Right ventricle: The cavity size was normal. Wall thickness was   normal. Systolic function was normal. - Right atrium: The atrium was normal in size. - Pulmonary arteries: Systolic pressure was within the normal   range. - Inferior vena cava: The vessel was normal in size. - Pericardium, extracardiac: There was no pericardial effusion.  Impressions:  - Compared to the report from 10/04/2014 (images not available) LVEF   is now improved, estimated at 45-50%. There is hypokinesis of the   mid and apical inferior walls.  ASSESSMENT AND PLAN:  1. Takotsubo's cardiomyopathy. Recurrent. Initial episode in 2012 with recurrence in 2016. She is asymptomatic. Mild LV dysfunction noted on last Echo. Will continue low dose Coreg and lisinopril. She is euvolemic. Follow up in one year  2. Hypokalemia noted on ED visit. Will repeat BMET today.

## 2017-12-28 ENCOUNTER — Ambulatory Visit: Payer: Medicare Other | Admitting: Cardiology

## 2017-12-28 ENCOUNTER — Encounter: Payer: Self-pay | Admitting: Cardiology

## 2017-12-28 VITALS — BP 120/58 | HR 77 | Ht 63.0 in | Wt 131.4 lb

## 2017-12-28 DIAGNOSIS — I5181 Takotsubo syndrome: Secondary | ICD-10-CM | POA: Diagnosis not present

## 2017-12-28 DIAGNOSIS — E7800 Pure hypercholesterolemia, unspecified: Secondary | ICD-10-CM

## 2017-12-28 DIAGNOSIS — E876 Hypokalemia: Secondary | ICD-10-CM

## 2017-12-28 DIAGNOSIS — I10 Essential (primary) hypertension: Secondary | ICD-10-CM | POA: Diagnosis not present

## 2017-12-28 NOTE — Patient Instructions (Signed)
We will repeat potassium level today  Continue  Your current therapy  Follow up in one year

## 2017-12-29 LAB — BASIC METABOLIC PANEL
BUN/Creatinine Ratio: 14 (ref 12–28)
BUN: 11 mg/dL (ref 8–27)
CALCIUM: 9.2 mg/dL (ref 8.7–10.3)
CO2: 25 mmol/L (ref 20–29)
CREATININE: 0.76 mg/dL (ref 0.57–1.00)
Chloride: 102 mmol/L (ref 96–106)
GFR calc Af Amer: 85 mL/min/{1.73_m2} (ref >59)
GFR calc non Af Amer: 74 mL/min/{1.73_m2} (ref >59)
GLUCOSE: 80 mg/dL (ref 65–99)
Potassium: 3.5 mmol/L (ref 3.5–5.2)
SODIUM: 143 mmol/L (ref 134–144)

## 2018-12-29 NOTE — Progress Notes (Signed)
Patient ID: Charlene Vincent, female   DOB: [birth year], 84 y.o.   MRN: [id number]    Date:  01/02/2019   ID:  Charlene Vincent, DOB 01-Jul-1934, MRN [id number]  PCP:  Aletha Halim., PA-C  Primary Cardiologist:   Peter Martinique MD   Chief Complaint  Patient presents with  . Chest Pain     History of Present Illness: MARCELLA Vincent is a 84 y.o. female is seen for follow up Takotsubo CM. In August 2016 she presented with a foot injury from a mechanical fall. Her foot was treated and a laceration was repaired. An ECG was performed and was significantly abnormal. Her troponin was elevated. The ECG met STEMI criteria and she was transferred emergently to [facility] and taken directly to the cath lab.    Left heart catheterization  10/02/2014 revealed normal coronary arteries.  LVEF was 25-35%.   Hyperdynamic base consistent with Takotsubo  Syndrome.   She had an echocardiogram on 10/03/2014 which confirmed an ejection fraction of 30-35%. There is akinesis of the mid apical anteroseptal, inferior and apical myocardium. Grade 2 diastolic dysfunction. Moderate LVH. She had a similar episode of stress induced cardiomyopathy in 2012. EF returned to normal following that episode. Her most recent Echo in October 2016 showed an EF 45-50%.   On follow up today she is doing very well. Denies any chest pain , dyspnea, palpitations, edema. No fatigue. She states she walks daily and eats a healthy diet. She states she feels great.   Wt Readings from Last 3 Encounters:  01/02/19 123 lb 6.4 oz (56 kg)  12/28/17 131 lb 6.4 oz (59.6 kg)  11/14/17 135 lb (61.2 kg)     Past Medical History:  Diagnosis Date  . Anemia   . Arthritis   . Cellulitis   . Costochondritis   . Headache   . Hypothyroidism   . Myocardial infarction [facility]) 2012, 2016   both Takotsubo  . Pneumonia   . Shingles   . Takotsubo cardiomyopathy    a. 2012  b. LHC in 09/2014 nl cors, EF 30-35%>>45-50% by echo 11/2014  . Tremor, essential      Current Outpatient Medications  Medication Sig Dispense Refill  . acetaminophen (TYLENOL) 500 MG tablet Take 1,000 mg by mouth every 6 (six) hours.    Marland Kitchen atorvastatin (LIPITOR) 80 MG tablet Take 80 mg by mouth daily.    . carvedilol (COREG) 3.125 MG tablet Take 1 tablet (3.125 mg total) by mouth 2 (two) times daily with a meal.    . doxepin (SINEQUAN) 10 MG capsule Take 10 mg by mouth daily as needed (itching).    . fluticasone (FLONASE) 50 MCG/ACT nasal spray Place 1-2 sprays into both nostrils daily.    Marland Kitchen levothyroxine (SYNTHROID, LEVOTHROID) 88 MCG tablet Take 88 mcg by mouth daily.    Marland Kitchen lisinopril (PRINIVIL,ZESTRIL) 2.5 MG tablet Take 1 tablet (2.5 mg total) by mouth daily.    Marland Kitchen oxyCODONE-acetaminophen (PERCOCET/ROXICET) 5-325 MG tablet Take 0.5-1 tablets by mouth daily. Alternates taking one-half tablet daily with one tablet daily     No current facility-administered medications for this visit.     Allergies:    Allergies  Allergen Reactions  . Aspirin     But taking low dose therapy without problems.  . Nystatin Itching    She said when she used the cream it made symptoms worse   . Penicillins Rash    Social History:  The patient  reports that she  quit smoking about 8 years ago. Her smoking use included cigarettes. She smoked 0.50 packs per day. She has never used smokeless tobacco. She reports that she does not drink alcohol or use drugs.   Family history:   Family History  Problem Relation Age of Onset  . Heart disease Father   . Heart disease Brother     ROS:  Please see the history of present illness.  All other systems reviewed and negative.   PHYSICAL EXAM: VS:  BP 135/67   Pulse 70   Temp (!) 96.8 F (36 C)   Ht _0  (1.651 m)   Wt 123 lb 6.4 oz (56 kg)   SpO2 96%   BMI 20.53 kg/m  GENERAL:  Well appearing EF in NAD HEENT:  PERRL, EOMI, sclera are clear. Oropharynx is clear. NECK:  No jugular venous distention, carotid upstroke brisk and symmetric, no  bruits, no thyromegaly or adenopathy LUNGS:  Clear to auscultation bilaterally CHEST:  Unremarkable HEART:  RRR,  PMI not displaced or sustained,S1 and S2 within normal limits, no S3, no S4: no clicks, no rubs, no murmurs ABD:  Soft, nontender. BS +, no masses or bruits. No hepatomegaly, no splenomegaly EXT:  2 + pulses throughout, no edema, no cyanosis no clubbing SKIN:  Warm and dry.  No rashes NEURO:  Alert and oriented x 3. Cranial nerves II through XII intact. PSYCH:  Cognitively intact   Lipid Panel     Component Value Date/Time   CHOL 82 10/04/2014 0300   TRIG 67 10/04/2014 0300   HDL 32 (L) 10/04/2014 0300   CHOLHDL 2.6 10/04/2014 0300   VLDL 13 10/04/2014 0300   LDLCALC 37 10/04/2014 0300   Lab Results  Component Value Date   WBC 4.4 11/14/2017   HGB 12.2 11/14/2017   HCT 38.7 11/14/2017   PLT 150 11/14/2017   GLUCOSE 80 12/28/2017   CHOL 82 10/04/2014   TRIG 67 10/04/2014   HDL 32 (L) 10/04/2014   LDLCALC 37 10/04/2014   ALT 36 11/14/2017   AST 38 11/14/2017   NA 143 12/28/2017   K 3.5 12/28/2017   CL 102 12/28/2017   CREATININE 0.76 12/28/2017   BUN 11 12/28/2017   CO2 25 12/28/2017   TSH 4.635 (H) 10/07/2014   INR 0.97 09/10/2010   HGBA1C 5.6 09/10/2010   Ecg today shows NSR rate 67. Prolonged QTc 490 msec. Otherwise normal.  I have personally reviewed and interpreted this study.   Echo 12/06/14: Study Conclusions  - Left ventricle: The cavity size was normal. There was mild focal   basal hypertrophy of the septum. Systolic function was mildly   reduced. The estimated ejection fraction was in the range of 45%   to 50%. Wall motion was normal; there were no regional wall   motion abnormalities. Doppler parameters are consistent with   abnormal left ventricular relaxation (grade 1 diastolic   dysfunction). There was no evidence of elevated ventricular   filling pressure by Doppler parameters. - Aortic valve: Trileaflet; normal thickness leaflets.  There was no   regurgitation. - Aortic root: The aortic root was normal in size. - Mitral valve: There was no regurgitation. - Right ventricle: The cavity size was normal. Wall thickness was   normal. Systolic function was normal. - Right atrium: The atrium was normal in size. - Pulmonary arteries: Systolic pressure was within the normal   range. - Inferior vena cava: The vessel was normal in size. - Pericardium, extracardiac: There  was no pericardial effusion.  Impressions:  - Compared to the report from 10/04/2014 (images not available) LVEF   is now improved, estimated at 45-50%. There is hypokinesis of the   mid and apical inferior walls.  ASSESSMENT AND PLAN:  1. Takotsubo's cardiomyopathy. Recurrent. Initial episode in 2012 with recurrence in 2016. No recurrence since then. She is asymptomatic. Mild LV dysfunction noted on last Echo. Will continue low dose Coreg and lisinopril. She is euvolemic. Follow up in one year

## 2019-01-02 ENCOUNTER — Other Ambulatory Visit: Payer: Self-pay

## 2019-01-02 ENCOUNTER — Encounter: Payer: Self-pay | Admitting: Cardiology

## 2019-01-02 ENCOUNTER — Ambulatory Visit (INDEPENDENT_AMBULATORY_CARE_PROVIDER_SITE_OTHER): Payer: Medicare HMO | Admitting: Cardiology

## 2019-01-02 VITALS — BP 135/67 | HR 70 | Temp 96.8°F | Ht 65.0 in | Wt 123.4 lb

## 2019-01-02 DIAGNOSIS — E7800 Pure hypercholesterolemia, unspecified: Secondary | ICD-10-CM

## 2019-01-02 DIAGNOSIS — I10 Essential (primary) hypertension: Secondary | ICD-10-CM | POA: Diagnosis not present

## 2019-01-02 DIAGNOSIS — I5181 Takotsubo syndrome: Secondary | ICD-10-CM | POA: Diagnosis not present

## 2019-03-12 ENCOUNTER — Other Ambulatory Visit: Payer: Self-pay

## 2019-03-12 ENCOUNTER — Emergency Department (HOSPITAL_COMMUNITY)
Admission: EM | Admit: 2019-03-12 | Discharge: 2019-03-12 | Disposition: A | Discharge status: Home / Self Care | Payer: Medicare Other | Attending: Emergency Medicine | Admitting: Emergency Medicine

## 2019-03-12 ENCOUNTER — Encounter (HOSPITAL_COMMUNITY): Payer: Self-pay | Admitting: Emergency Medicine

## 2019-03-12 DIAGNOSIS — I10 Essential (primary) hypertension: Secondary | ICD-10-CM | POA: Diagnosis not present

## 2019-03-12 DIAGNOSIS — B3731 Acute candidiasis of vulva and vagina: Secondary | ICD-10-CM

## 2019-03-12 DIAGNOSIS — B373 Candidiasis of vulva and vagina: Secondary | ICD-10-CM | POA: Diagnosis not present

## 2019-03-12 DIAGNOSIS — Z79899 Other long term (current) drug therapy: Secondary | ICD-10-CM | POA: Insufficient documentation

## 2019-03-12 DIAGNOSIS — E039 Hypothyroidism, unspecified: Secondary | ICD-10-CM | POA: Insufficient documentation

## 2019-03-12 DIAGNOSIS — N390 Urinary tract infection, site not specified: Secondary | ICD-10-CM | POA: Diagnosis not present

## 2019-03-12 DIAGNOSIS — Z87891 Personal history of nicotine dependence: Secondary | ICD-10-CM | POA: Insufficient documentation

## 2019-03-12 DIAGNOSIS — R102 Pelvic and perineal pain: Secondary | ICD-10-CM | POA: Diagnosis present

## 2019-03-12 LAB — URINALYSIS, ROUTINE W REFLEX MICROSCOPIC
Bilirubin Urine: NEGATIVE
Glucose, UA: NEGATIVE mg/dL
Ketones, ur: NEGATIVE mg/dL
Nitrite: NEGATIVE
Protein, ur: NEGATIVE mg/dL
Specific Gravity, Urine: 1.006 (ref 1.005–1.030)
WBC, UA: >50 WBC/hpf — ABNORMAL HIGH (ref 0–5)
pH: 9 — ABNORMAL HIGH (ref 5.0–8.0)

## 2019-03-12 MED ORDER — FLUCONAZOLE 150 MG PO TABS: 150.0000 mg | ORAL_TABLET | Freq: Every day | ORAL | 0 refills | Status: AC

## 2019-03-12 MED ORDER — CEPHALEXIN 500 MG PO CAPS: 500.0000 mg | ORAL_CAPSULE | Freq: Two times a day (BID) | ORAL | 0 refills | Status: AC

## 2019-03-12 NOTE — ED Triage Notes (Signed)
Pt c/o vaginal burning since yesterday. Area inside labia is red. nad

## 2019-03-12 NOTE — Discharge Instructions (Signed)
Thank you for allowing me to care for you today. Please return to the emergency department if you have new or worsening symptoms. Take your medications as instructed.  ° °

## 2019-03-12 NOTE — ED Provider Notes (Signed)
[facility] EMERGENCY DEPARTMENT Provider Note   CSN: [id number] Arrival date & time: 03/12/19  0865     History Chief Complaint  Patient presents with   Vaginal Pain    Charlene Vincent is a 85 y.o. female.  Patient is an 85 year old female who lives at home with her husband and son presenting to the emergency department for vaginal irritation which she noticed yesterday.  She reports that she has some burning in her skin between her legs.  Reports some burning with urination as well.  Denies any fever, chills, nausea, vomiting, abdominal pain        Past Medical History:  Diagnosis Date   Anemia    Arthritis    Cellulitis    Costochondritis    Headache    Hypothyroidism    Myocardial infarction ([place]) 2012, 2016   both Takotsubo   Pneumonia    Shingles    Takotsubo cardiomyopathy    a. 2012  b. LHC in 09/2014 nl cors, EF 30-35%>>45-50% by echo 11/2014   Tremor, essential     Patient Active Problem List   Diagnosis Date Noted   Open wound of foot except toe(s) alone 01/22/2015   Hyperlipidemia 10/25/2014   STEMI (ST elevation myocardial infarction) ([place]) 10/09/2014   Foot laceration    Fever    Acute MI ([place]) 10/02/2014   Foot fracture 10/02/2014   Stress-induced cardiomyopathy 10/02/2014   Demand ischemia ([place]) 10/02/2014   Takotsubo cardiomyopathy    Hyponatremia 07/20/2011   Leukopenia 07/20/2011   Abscess or cellulitis of gluteal region 07/17/2011   Hypothyroidism 07/17/2011   Anemia 07/17/2011   HTN (hypertension) 07/17/2011    Past Surgical History:  Procedure Laterality Date   ABDOMINAL HYSTERECTOMY     APPLICATION OF A-CELL OF EXTREMITY Left 01/22/2015   Procedure: APPLICATION OF A-CELL TO LEFT THIGH ;  Surgeon: Irene Limbo, MD;  Location: [place];  Service: Plastics;  Laterality: Left;   APPLICATION OF WOUND VAC Left 10/09/2014   Procedure: APPLICATION OF WOUND VAC;  Surgeon: Meredith Pel, MD;   Location: [place];  Service: Orthopedics;  Laterality: Left;   CARDIAC CATHETERIZATION  September 10, 2010   EF 35 to 40%. No significant CAD. ? takotsubo cardiomyopathy   CARDIAC CATHETERIZATION N/A 10/02/2014   Procedure: Left Heart Cath and Coronary Angiography;  Surgeon: Peter M Martinique, MD;  Location: [place] CV LAB;  Service: Cardiovascular;  Laterality: N/A;   I & D EXTREMITY Left 10/09/2014   Procedure: DEBRIDEMENT LEFT FOOT;  Surgeon: Meredith Pel, MD;  Location: [place];  Service: Orthopedics;  Laterality: Left;   INGUINAL HERNIA REPAIR     SKIN FULL THICKNESS GRAFT Left 10/12/2014   Procedure: Debridement left foot, application integra, wound vacuum-assisted closure;  Surgeon: Irene Limbo, MD;  Location: [place];  Service: Plastics;  Laterality: Left;   SKIN GRAFT Left 01/22/2015   left thigh to left foot   SKIN SPLIT GRAFT Left 01/22/2015   Procedure:  SPLIT THICKNESS SKIN GRAFT FROM LEFT THIGH TO LEFT FOOT ;  Surgeon: Irene Limbo, MD;  Location: [place];  Service: Plastics;  Laterality: Left;     OB History   No obstetric history on file.     Family History  Problem Relation Age of Onset   Heart disease Father    Heart disease Brother     Social History   Tobacco Use   Smoking status: Former Smoker    Packs/day: 0.50  Types: Cigarettes    Quit date: 09/07/2010    Years since quitting: 8.5   Smokeless tobacco: Never Used  Substance Use Topics   Alcohol use: No   Drug use: No    Home Medications Prior to Admission medications   Medication Sig Start Date End Date Taking? Authorizing Provider  acetaminophen (TYLENOL) 500 MG tablet Take 1,000 mg by mouth every 6 (six) hours.    [provider]  atorvastatin (LIPITOR) 80 MG tablet Take 80 mg by mouth daily.    [provider]  carvedilol (COREG) 3.125 MG tablet Take 1 tablet (3.125 mg total) by mouth 2 (two) times daily with a meal. 10/12/14   Ghimire, Werner Lean, MD  cephALEXin  (KEFLEX) 500 MG capsule Take 1 capsule (500 mg total) by mouth 2 (two) times daily for 7 days. 03/12/19 03/19/19  Ronnie Doss A, PA-C  doxepin (SINEQUAN) 10 MG capsule Take 10 mg by mouth daily as needed (itching).    [provider]  fluconazole (DIFLUCAN) 150 MG tablet Take 1 tablet (150 mg total) by mouth daily for 1 day. 03/12/19 03/13/19  Ronnie Doss A, PA-C  fluticasone (FLONASE) 50 MCG/ACT nasal spray Place 1-2 sprays into both nostrils daily. 09/02/16   [provider]  levothyroxine (SYNTHROID, LEVOTHROID) 88 MCG tablet Take 88 mcg by mouth daily. 04/08/16   [provider]  lisinopril (PRINIVIL,ZESTRIL) 2.5 MG tablet Take 1 tablet (2.5 mg total) by mouth daily. 10/12/14   Ghimire, Werner Lean, MD  oxyCODONE-acetaminophen (PERCOCET/ROXICET) 5-325 MG tablet Take 0.5-1 tablets by mouth daily. Alternates taking one-half tablet daily with one tablet daily 04/14/16   [provider]    Allergies    Aspirin, Nystatin, and Penicillins  Review of Systems   Review of Systems  Constitutional: Negative for chills and fever.  HENT: Negative for sore throat.   Respiratory: Negative for cough and shortness of breath.   Cardiovascular: Negative for chest pain.  Gastrointestinal: Negative for abdominal pain, nausea and vomiting.  Genitourinary: Positive for dysuria and vaginal pain. Negative for decreased urine volume, difficulty urinating, frequency, genital sores, hematuria, urgency, vaginal bleeding and vaginal discharge.  Skin: Negative for rash.    Physical Exam Updated Vital Signs BP 136/69    Pulse 71    Temp 98.1 F (36.7 C) (Oral)    Resp 18    SpO2 99%   Physical Exam Vitals and nursing note reviewed. Exam conducted with a chaperone present.  Constitutional:      General: She is not in acute distress.    Appearance: Normal appearance. She is not ill-appearing, toxic-appearing or diaphoretic.     Comments: Pleasant elderly female  HENT:     Head:  Normocephalic.  Eyes:     Conjunctiva/sclera: Conjunctivae normal.  Pulmonary:     Effort: Pulmonary effort is normal.  Genitourinary:    Comments: Patient does have what appears to be yeast vaginitis with erythema of the labia without any vaginal discharge.  No abscess, signs of injury or trauma, swelling, external hemorrhoids Skin:    General: Skin is dry.  Neurological:     Mental Status: She is alert.  Psychiatric:        Mood and Affect: Mood normal.     ED Results / Procedures / Treatments   Labs (all labs ordered are listed, but only abnormal results are displayed) Labs Reviewed  URINALYSIS, ROUTINE W REFLEX MICROSCOPIC - Abnormal; Notable for the following components:      Result  Value   APPearance CLOUDY (*)    pH 9.0 (*)    Hgb urine dipstick SMALL (*)    Leukocytes,Ua LARGE (*)    WBC, UA >50 (*)    Bacteria, UA FEW (*)    All other components within normal limits  URINE CULTURE    EKG None  Radiology No results found.  Procedures Procedures (including critical care time)  Medications Ordered in ED Medications - No data to display  ED Course  I have reviewed the triage vital signs and the nursing notes.  Pertinent labs & imaging results that were available during my care of the patient were reviewed by me and considered in my medical decision making (see chart for details).  Clinical Course as of Mar 11 1017  Sun Mar 12, 2019  1017 Patient presenting with dysuria and vaginal irritation since yesterday.  On my exam appears to have erythema to the skin is consistent with yeast infection.  Urinalysis also consistent with urinary tract infection.  Urine culture was sent.  We will start patient on Keflex as well as Diflucan.  Patient appears very well on exam with normal vital signs.  Advised on return precautions.   [KM]    Clinical Course User Index [KM] Jeral Pinch   MDM Rules/Calculators/A&P                      Based on review of  vitals, medical screening exam, lab work and/or imaging, there does not appear to be an acute, emergent etiology for the patient's symptoms. Counseled pt on good return precautions and encouraged both PCP and ED follow-up as needed.  Prior to discharge, I also discussed incidental imaging findings with patient in detail and advised appropriate, recommended follow-up in detail.  Clinical Impression: 1. Acute UTI   2. Yeast vaginitis     Disposition: Discharge  Prior to providing a prescription for a controlled substance, I independently reviewed the patient's recent prescription history on the [place] Controlled Substance Reporting System. The patient had no recent or regular prescriptions and was deemed appropriate for a brief, less than 3 day prescription of narcotic for acute analgesia.  This note was prepared with assistance of Conservation officer, historic buildings. Occasional wrong-word or sound-a-like substitutions may have occurred due to the inherent limitations of voice recognition software.  Final Clinical Impression(s) / ED Diagnoses Final diagnoses:  Acute UTI  Yeast vaginitis    Rx / DC Orders ED Discharge Orders         Ordered    fluconazole (DIFLUCAN) 150 MG tablet  Daily     03/12/19 1019    cephALEXin (KEFLEX) 500 MG capsule  2 times daily     03/12/19 [address] 03/12/19 1019    Bethann Berkshire, MD 03/12/19 1235

## 2019-03-14 LAB — URINE CULTURE: Culture: >=100000 — AB

## 2019-03-15 ENCOUNTER — Telehealth: Payer: Self-pay

## 2019-03-15 NOTE — Telephone Encounter (Signed)
Post ED Visit - Positive Culture Follow-up  Culture report reviewed by antimicrobial stewardship pharmacist: [facility] []  , Pharm.D. []  Enzo Bi, .D., BCPS AQ-ID []  Celedonio Miyamoto, Pharm.D., BCPS []  [address], Pharm.D., BCPS []  [place], Garvin Fila.D., BCPS, AAHIVP []  , Pharm.D., BCPS, AAHIVP [x]  Georgina Pillion, PharmD, BCPS []  , PharmD, BCPS []  [place], PharmD, BCPS []  [address], PharmD []  , PharmD, BCPS []  Estella Husk, PharmD  Pharmacy Team []  Lysle Pearl, PharmD []  , PharmD []  Phillips Climes, PharmD []  , Rph []  Agapito Games) , PharmD []  Verlan Friends, PharmD []  , PharmD []  Mervyn Gay, PharmD []  , PharmD []  Vinnie Level, PharmD []  [facility], PharmD []  , PharmD []  Len Childs, PharmD   Positive urine culture Treated with Cephalexin, organism sensitive to the same and no further patient follow-up is required at this time.  03/15/2019, 10:29 AM

## 2019-04-26 ENCOUNTER — Other Ambulatory Visit: Payer: Self-pay

## 2019-04-26 ENCOUNTER — Encounter (HOSPITAL_COMMUNITY): Payer: Self-pay | Admitting: *Deleted

## 2019-04-26 ENCOUNTER — Emergency Department (HOSPITAL_COMMUNITY)
Admission: EM | Admit: 2019-04-26 | Discharge: 2019-04-26 | Disposition: A | Discharge status: Home / Self Care | Payer: Medicare Other | Attending: Emergency Medicine | Admitting: Emergency Medicine

## 2019-04-26 ENCOUNTER — Emergency Department (HOSPITAL_COMMUNITY): Payer: Medicare Other

## 2019-04-26 DIAGNOSIS — Y9301 Activity, walking, marching and hiking: Secondary | ICD-10-CM | POA: Insufficient documentation

## 2019-04-26 DIAGNOSIS — Z79899 Other long term (current) drug therapy: Secondary | ICD-10-CM | POA: Diagnosis not present

## 2019-04-26 DIAGNOSIS — W01198A Fall on same level from slipping, tripping and stumbling with subsequent striking against other object, initial encounter: Secondary | ICD-10-CM | POA: Diagnosis not present

## 2019-04-26 DIAGNOSIS — Z87891 Personal history of nicotine dependence: Secondary | ICD-10-CM | POA: Diagnosis not present

## 2019-04-26 DIAGNOSIS — I10 Essential (primary) hypertension: Secondary | ICD-10-CM | POA: Diagnosis not present

## 2019-04-26 DIAGNOSIS — Y92410 Unspecified street and highway as the place of occurrence of the external cause: Secondary | ICD-10-CM | POA: Insufficient documentation

## 2019-04-26 DIAGNOSIS — Y999 Unspecified external cause status: Secondary | ICD-10-CM | POA: Diagnosis not present

## 2019-04-26 DIAGNOSIS — E039 Hypothyroidism, unspecified: Secondary | ICD-10-CM | POA: Diagnosis not present

## 2019-04-26 DIAGNOSIS — S81012A Laceration without foreign body, left knee, initial encounter: Secondary | ICD-10-CM | POA: Insufficient documentation

## 2019-04-26 DIAGNOSIS — S81812A Laceration without foreign body, left lower leg, initial encounter: Secondary | ICD-10-CM

## 2019-04-26 NOTE — ED Provider Notes (Signed)
[facility] EMERGENCY DEPARTMENT Provider Note   CSN: [id number] Arrival date & time: 04/26/19  1401     History Chief Complaint  Patient presents with  . Fall    Charlene Vincent is a 85 y.o. female.  Patient presents to ED for evaluation of left knee injury. Patient reports that she fell after a stumble while walking, striking left knee on curb. She has a skin tear to the knee. Bleeding is controlled. She denies loss of consciousness or dizziness. She did not strike her head.  The history is provided by the patient. No language interpreter was used.  Fall This is a new problem. The current episode started 3 to 5 hours ago.       Past Medical History:  Diagnosis Date  . Anemia   . Arthritis   . Cellulitis   . Costochondritis   . Headache   . Hypothyroidism   . Myocardial infarction [facility]) 2012, 2016   both Takotsubo  . Pneumonia   . Shingles   . Takotsubo cardiomyopathy    a. 2012  b. LHC in 09/2014 nl cors, EF 30-35%>>45-50% by echo 11/2014  . Tremor, essential     Patient Active Problem List   Diagnosis Date Noted  . Open wound of foot except toe(s) alone 01/22/2015  . Hyperlipidemia 10/25/2014  . STEMI (ST elevation myocardial infarction) (HCC) 10/09/2014  . Foot laceration   . Fever   . Acute MI (HCC) 10/02/2014  . Foot fracture 10/02/2014  . Stress-induced cardiomyopathy 10/02/2014  . Demand ischemia (HCC) 10/02/2014  . Takotsubo cardiomyopathy   . Hyponatremia 07/20/2011  . Leukopenia 07/20/2011  . Abscess or cellulitis of gluteal region 07/17/2011  . Hypothyroidism 07/17/2011  . Anemia 07/17/2011  . HTN (hypertension) 07/17/2011    Past Surgical History:  Procedure Laterality Date  . ABDOMINAL HYSTERECTOMY    . APPLICATION OF A-CELL OF EXTREMITY Left 01/22/2015   Procedure: APPLICATION OF A-CELL TO LEFT THIGH ;  Surgeon: Glenna Fellows, MD;  Location: [facility] OR;  Service: Plastics;  Laterality: Left;  . APPLICATION OF WOUND VAC Left 10/09/2014   Procedure: APPLICATION OF WOUND VAC;  Surgeon: Cammy Copa, MD;  Location: [facility] OR;  Service: Orthopedics;  Laterality: Left;  . CARDIAC CATHETERIZATION  September 10, 2010   EF 35 to 40%. No significant CAD. ? takotsubo cardiomyopathy  . CARDIAC CATHETERIZATION N/A 10/02/2014   Procedure: Left Heart Cath and Coronary Angiography;  Surgeon: Peter M Swaziland, MD;  Location: [facility] INVASIVE CV LAB;  Service: Cardiovascular;  Laterality: N/A;  . I & D EXTREMITY Left 10/09/2014   Procedure: DEBRIDEMENT LEFT FOOT;  Surgeon: Cammy Copa, MD;  Location: [facility] OR;  Service: Orthopedics;  Laterality: Left;  . INGUINAL HERNIA REPAIR    . SKIN FULL THICKNESS GRAFT Left 10/12/2014   Procedure: Debridement left foot, application integra, wound vacuum-assisted closure;  Surgeon: Glenna Fellows, MD;  Location: [facility] OR;  Service: Plastics;  Laterality: Left;  . SKIN GRAFT Left 01/22/2015   left thigh to left foot  . SKIN SPLIT GRAFT Left 01/22/2015   Procedure:  SPLIT THICKNESS SKIN GRAFT FROM LEFT THIGH TO LEFT FOOT ;  Surgeon: Glenna Fellows, MD;  Location: [facility] OR;  Service: Plastics;  Laterality: Left;     OB History   No obstetric history on file.     Family History  Problem Relation Age of Onset  . Heart disease Father   . Heart disease Brother     Social History  Tobacco Use  . Smoking status: Former Smoker    Packs/day: 0.50    Types: Cigarettes    Quit date: 09/07/2010    Years since quitting: 8.6  . Smokeless tobacco: Never Used  Substance Use Topics  . Alcohol use: No  . Drug use: No    Home Medications Prior to Admission medications   Medication Sig Start Date End Date Taking? Authorizing Provider  acetaminophen (TYLENOL) 500 MG tablet Take 1,000 mg by mouth every 6 (six) hours.    [provider]  atorvastatin (LIPITOR) 80 MG tablet Take 80 mg by mouth daily.    [provider]  carvedilol (COREG) 3.125 MG tablet Take 1 tablet (3.125 mg total) by mouth 2 (two)  times daily with a meal. 10/12/14   Ghimire, Werner Lean, MD  doxepin (SINEQUAN) 10 MG capsule Take 10 mg by mouth daily as needed (itching).    [provider]  fluticasone (FLONASE) 50 MCG/ACT nasal spray Place 1-2 sprays into both nostrils daily. 09/02/16   [provider]  levothyroxine (SYNTHROID, LEVOTHROID) 88 MCG tablet Take 88 mcg by mouth daily. 04/08/16   [provider]  lisinopril (PRINIVIL,ZESTRIL) 2.5 MG tablet Take 1 tablet (2.5 mg total) by mouth daily. 10/12/14   Ghimire, Werner Lean, MD  oxyCODONE-acetaminophen (PERCOCET/ROXICET) 5-325 MG tablet Take 0.5-1 tablets by mouth daily. Alternates taking one-half tablet daily with one tablet daily 04/14/16   [provider]    Allergies    Aspirin, Nystatin, and Penicillins  Review of Systems   Review of Systems  Musculoskeletal:       Knee pain  Skin: Positive for wound.  All other systems reviewed and are negative.   Physical Exam Updated Vital Signs BP 100/76   Pulse 68   Temp 98.2 F (36.8 C) (Oral)   Resp 16   Ht 5\' 7"  (1.702 m)   Wt 55.8 kg   SpO2 95%   BMI 19.26 kg/m   Physical Exam Vitals and nursing note reviewed.  HENT:     Head: Atraumatic.  Eyes:     Conjunctiva/sclera: Conjunctivae normal.  Cardiovascular:     Rate and Rhythm: Normal rate and regular rhythm.  Pulmonary:     Effort: Pulmonary effort is normal.     Breath sounds: Normal breath sounds.  Abdominal:     Palpations: Abdomen is soft.  Musculoskeletal:        General: Tenderness and signs of injury present.     Cervical back: Normal range of motion.  Skin:    General: Skin is warm and dry.  Neurological:     Mental Status: She is alert and oriented to person, place, and time.  Psychiatric:        Behavior: Behavior normal.       ED Results / Procedures / Treatments   Labs (all labs ordered are listed, but only abnormal results are displayed) Labs Reviewed - No data to  display  EKG None  Radiology No results found.  Procedures . Laceration Repair  Date/Time: 04/26/2019 4:28 PM Performed by: 06/26/2019, NP Authorized by: Felicie Morn, NP   Consent:    Consent obtained:  Verbal   Consent given by:  Patient   Risks discussed:  Infection, poor cosmetic result and poor wound healing Anesthesia (see MAR for exact dosages):    Anesthesia method:  None Laceration details:    Location:  Leg   Leg location:  L knee Repair type:    Repair type:  Simple Pre-procedure details:    Preparation:  Imaging obtained to evaluate for foreign bodies Exploration:    Hemostasis achieved with:  Direct pressure   Contaminated: no   Treatment:    Amount of cleaning:  Standard   Irrigation solution:  Sterile saline Skin repair:    Repair method:  Steri-Strips   Number of Steri-Strips:  5 Approximation:    Approximation:  Loose Post-procedure details:    Dressing:  Sterile dressing and bulky dressing   Patient tolerance of procedure:  Tolerated well, no immediate complications Comments:     Skin tear overlying left knee. Wound cleaned, skin edges loosely approximated and secured with steri-strips and tegaderm.   (including critical care time)  Medications Ordered in ED Medications - No data to display  ED Course  I have reviewed the triage vital signs and the nursing notes.  Pertinent labs & imaging results that were available during my care of the patient were reviewed by me and considered in my medical decision making (see chart for details).   Patient discussed with Dr. Sedonia Small.   MDM Rules/Calculators/A&P                      Patient X-Ray negative for obvious fracture or dislocation.   Tetanus UTD. Skin tear occurred < 12 hours prior to repair. Discussed wound care with pt and answered questions. Pt to f-u for wound check should there be signs of infection. Pt is hemodynamically stable with no complaints prior to dc.      Final Clinical  Impression(s) / ED Diagnoses Final diagnoses:  Noninfected skin tear of left leg, initial encounter    Rx / DC Orders ED Discharge Orders    None       Etta Quill, NP 04/26/19 1635    Maudie Flakes, MD 04/27/19 0730

## 2019-04-26 NOTE — ED Triage Notes (Signed)
Pt states that she fell this am while walking on the cement when she went to step to another area. Denies any LOC, c/o laceration to left knee area, bleeding controlled, bandage in place prior to arrival in er.

## 2019-04-26 NOTE — ED Notes (Signed)
Patient transported to X-ray 

## 2019-05-14 ENCOUNTER — Other Ambulatory Visit: Payer: Self-pay

## 2019-05-14 ENCOUNTER — Encounter (HOSPITAL_COMMUNITY): Payer: Self-pay | Admitting: Emergency Medicine

## 2019-05-14 ENCOUNTER — Emergency Department (HOSPITAL_COMMUNITY)
Admission: EM | Admit: 2019-05-14 | Discharge: 2019-05-14 | Disposition: A | Discharge status: Home / Self Care | Payer: Medicare Other | Attending: Emergency Medicine | Admitting: Emergency Medicine

## 2019-05-14 DIAGNOSIS — R300 Dysuria: Secondary | ICD-10-CM | POA: Diagnosis not present

## 2019-05-14 DIAGNOSIS — E039 Hypothyroidism, unspecified: Secondary | ICD-10-CM | POA: Diagnosis not present

## 2019-05-14 DIAGNOSIS — Z87891 Personal history of nicotine dependence: Secondary | ICD-10-CM | POA: Insufficient documentation

## 2019-05-14 DIAGNOSIS — Z79899 Other long term (current) drug therapy: Secondary | ICD-10-CM | POA: Insufficient documentation

## 2019-05-14 DIAGNOSIS — R350 Frequency of micturition: Secondary | ICD-10-CM | POA: Diagnosis not present

## 2019-05-14 DIAGNOSIS — I10 Essential (primary) hypertension: Secondary | ICD-10-CM | POA: Insufficient documentation

## 2019-05-14 DIAGNOSIS — K6289 Other specified diseases of anus and rectum: Secondary | ICD-10-CM | POA: Diagnosis not present

## 2019-05-14 LAB — URINALYSIS, ROUTINE W REFLEX MICROSCOPIC
Bacteria, UA: NONE SEEN
Bilirubin Urine: NEGATIVE
Glucose, UA: NEGATIVE mg/dL
Hgb urine dipstick: NEGATIVE
Ketones, ur: NEGATIVE mg/dL
Nitrite: NEGATIVE
Protein, ur: NEGATIVE mg/dL
Specific Gravity, Urine: 1.004 — ABNORMAL LOW (ref 1.005–1.030)
pH: 7 (ref 5.0–8.0)

## 2019-05-14 LAB — POC OCCULT BLOOD, ED: Fecal Occult Bld: NEGATIVE

## 2019-05-14 MED ORDER — CLOTRIMAZOLE-BETAMETHASONE 1-0.05 % EX CREA: TOPICAL_CREAM | CUTANEOUS | 0 refills | Status: DC

## 2019-05-14 NOTE — ED Triage Notes (Signed)
Patient c/o lower abd pain with dysuria and pain with BMs. Per patient last BM today at 4 with no blood noted. Denies any nausea, vomiting, diarrhea, or fevers.

## 2019-05-14 NOTE — Discharge Instructions (Signed)
Apply the cream prescribed to your perirectal area twice daily for up to 14 days.  Try to avoid scratching the area which can make this skin irritation worse.  Followup with your primary provider next week as you have scheduled.

## 2019-05-14 NOTE — ED Provider Notes (Signed)
[facility] EMERGENCY DEPARTMENT Provider Note   CSN: [id number] Arrival date & time: 05/14/19  1604     History Chief Complaint  Patient presents with  . Abdominal Pain    Charlene Vincent is a 85 y.o. female with a history of MI, takotsubo cardiomyopathy, HTN and hypothyroidism presenting with acute on chronicperirectal pain, burning and itching which worsened today.  She reports symptoms seem worse during and after bowel movements.  She denies constipation or straining, no rectal bleeding.  She has been using hydrocortisone hemorrhoid cream with some relief but is not sure she actually has a hemorrhoid.  She also reports pain with urination and increased urination that started today.  She has had no hematuria, denies back or flank pain, no n/v, fevers.  She has tried no other medicines and has found no alleviators for symptom relief.  HPI     Past Medical History:  Diagnosis Date  . Anemia   . Arthritis   . Cellulitis   . Costochondritis   . Headache   . Hypothyroidism   . Myocardial infarction [facility]) 2012, 2016   both Takotsubo  . Pneumonia   . Shingles   . Takotsubo cardiomyopathy    a. 2012  b. LHC in 09/2014 nl cors, EF 30-35%>>45-50% by echo 11/2014  . Tremor, essential     Patient Active Problem List   Diagnosis Date Noted  . Open wound of foot except toe(s) alone 01/22/2015  . Hyperlipidemia 10/25/2014  . STEMI (ST elevation myocardial infarction) (HCC) 10/09/2014  . Foot laceration   . Fever   . Acute MI (HCC) 10/02/2014  . Foot fracture 10/02/2014  . Stress-induced cardiomyopathy 10/02/2014  . Demand ischemia (HCC) 10/02/2014  . Takotsubo cardiomyopathy   . Hyponatremia 07/20/2011  . Leukopenia 07/20/2011  . Abscess or cellulitis of gluteal region 07/17/2011  . Hypothyroidism 07/17/2011  . Anemia 07/17/2011  . HTN (hypertension) 07/17/2011    Past Surgical History:  Procedure Laterality Date  . ABDOMINAL HYSTERECTOMY    . APPLICATION OF A-CELL  OF EXTREMITY Left 01/22/2015   Procedure: APPLICATION OF A-CELL TO LEFT THIGH ;  Surgeon: Glenna Fellows, MD;  Location: [facility] OR;  Service: Plastics;  Laterality: Left;  . APPLICATION OF WOUND VAC Left 10/09/2014   Procedure: APPLICATION OF WOUND VAC;  Surgeon: Cammy Copa, MD;  Location: [facility] OR;  Service: Orthopedics;  Laterality: Left;  . CARDIAC CATHETERIZATION  September 10, 2010   EF 35 to 40%. No significant CAD. ? takotsubo cardiomyopathy  . CARDIAC CATHETERIZATION N/A 10/02/2014   Procedure: Left Heart Cath and Coronary Angiography;  Surgeon: Peter M Swaziland, MD;  Location: [facility] INVASIVE CV LAB;  Service: Cardiovascular;  Laterality: N/A;  . I & D EXTREMITY Left 10/09/2014   Procedure: DEBRIDEMENT LEFT FOOT;  Surgeon: Cammy Copa, MD;  Location: [facility] OR;  Service: Orthopedics;  Laterality: Left;  . INGUINAL HERNIA REPAIR    . SKIN FULL THICKNESS GRAFT Left 10/12/2014   Procedure: Debridement left foot, application integra, wound vacuum-assisted closure;  Surgeon: Glenna Fellows, MD;  Location: [facility] OR;  Service: Plastics;  Laterality: Left;  . SKIN GRAFT Left 01/22/2015   left thigh to left foot  . SKIN SPLIT GRAFT Left 01/22/2015   Procedure:  SPLIT THICKNESS SKIN GRAFT FROM LEFT THIGH TO LEFT FOOT ;  Surgeon: Glenna Fellows, MD;  Location: [facility] OR;  Service: Plastics;  Laterality: Left;     OB History   No obstetric history on file.  Family History  Problem Relation Age of Onset  . Heart disease Father   . Heart disease Brother     Social History   Tobacco Use  . Smoking status: Former Smoker    Packs/day: 0.50    Types: Cigarettes    Quit date: 09/07/2010    Years since quitting: 8.6  . Smokeless tobacco: Never Used  Substance Use Topics  . Alcohol use: No  . Drug use: No    Home Medications Prior to Admission medications   Medication Sig Start Date End Date Taking? Authorizing Provider  acetaminophen (TYLENOL) 500 MG tablet Take 1,000 mg by mouth every 6  (six) hours.    [provider]  atorvastatin (LIPITOR) 80 MG tablet Take 80 mg by mouth daily.    [provider]  carvedilol (COREG) 3.125 MG tablet Take 1 tablet (3.125 mg total) by mouth 2 (two) times daily with a meal. 10/12/14   Ghimire, Werner Lean, MD  clotrimazole-betamethasone (LOTRISONE) cream Apply to affected area 2 times daily prn 05/14/19   Altin Sease, Raynelle Fanning, PA-C  doxepin (SINEQUAN) 10 MG capsule Take 10 mg by mouth daily as needed (itching).    [provider]  fluticasone (FLONASE) 50 MCG/ACT nasal spray Place 1-2 sprays into both nostrils daily. 09/02/16   [provider]  levothyroxine (SYNTHROID, LEVOTHROID) 88 MCG tablet Take 88 mcg by mouth daily. 04/08/16   [provider]  lisinopril (PRINIVIL,ZESTRIL) 2.5 MG tablet Take 1 tablet (2.5 mg total) by mouth daily. 10/12/14   Ghimire, Werner Lean, MD  oxyCODONE-acetaminophen (PERCOCET/ROXICET) 5-325 MG tablet Take 0.5-1 tablets by mouth daily. Alternates taking one-half tablet daily with one tablet daily 04/14/16   [provider]    Allergies    Aspirin, Nystatin, and Penicillins  Review of Systems   Review of Systems  Constitutional: Negative for chills and fever.  HENT: Negative for congestion and sore throat.   Eyes: Negative.   Respiratory: Negative for chest tightness and shortness of breath.   Cardiovascular: Negative for chest pain.  Gastrointestinal: Positive for rectal pain. Negative for abdominal pain, anal bleeding, blood in stool and nausea.  Genitourinary: Positive for dysuria and frequency. Negative for flank pain and urgency.  Musculoskeletal: Negative for arthralgias, back pain, joint swelling and neck pain.  Skin: Negative.  Negative for rash and wound.  Neurological: Negative for dizziness, weakness, light-headedness, numbness and headaches.  Psychiatric/Behavioral: Negative.     Physical Exam Updated Vital Signs BP 128/82 (BP Location: Right Arm)   Pulse 70    Temp 98.2 F (36.8 C) (Oral)   Resp 18   Ht 5\' 7"  (1.702 m)   Wt 56.7 kg   SpO2 100%   BMI 19.58 kg/m   Physical Exam Vitals and nursing note reviewed. Exam conducted with a chaperone present.  Constitutional:      Appearance: She is well-developed.  HENT:     Head: Normocephalic and atraumatic.  Eyes:     Conjunctiva/sclera: Conjunctivae normal.  Cardiovascular:     Rate and Rhythm: Normal rate and regular rhythm.     Heart sounds: Normal heart sounds.  Pulmonary:     Effort: Pulmonary effort is normal.     Breath sounds: Normal breath sounds. No wheezing.  Abdominal:     General: Abdomen is flat. Bowel sounds are normal.     Palpations: Abdomen is soft.     Tenderness: There is no abdominal tenderness. There is no right CVA tenderness, left CVA tenderness, guarding or  rebound.  Genitourinary:    Rectum: No external hemorrhoid.     Comments: External peri rectal erythema with multiple small abrasions, suspect from scratching.  No satellite lesions.  Perirectal skin appears slightly hypertrophic. No induration or drainage. Musculoskeletal:        General: Normal range of motion.     Cervical back: Normal range of motion.  Skin:    General: Skin is warm and dry.  Neurological:     Mental Status: She is alert.     ED Results / Procedures / Treatments   Labs (all labs ordered are listed, but only abnormal results are displayed) Labs Reviewed  URINALYSIS, ROUTINE W REFLEX MICROSCOPIC - Abnormal; Notable for the following components:      Result Value   Color, Urine STRAW (*)    Specific Gravity, Urine 1.004 (*)    Leukocytes,Ua TRACE (*)    All other components within normal limits  POC OCCULT BLOOD, ED    EKG None  Radiology No results found.  Procedures Procedures (including critical care time)  Medications Ordered in ED Medications - No data to display  ED Course  I have reviewed the triage vital signs and the nursing notes.  Pertinent labs &  imaging results that were available during my care of the patient were reviewed by me and considered in my medical decision making (see chart for details).    MDM Rules/Calculators/A&P                      Pt with perirectal skin irritation with excoriations. No evidence for acute infection, no satellite lesions.  No external hemorrhoid.  She was started on lotrisone cream to cover for fungal infection. She has f/u with primary provider next week.  Urine negative for infection.  abd soft, no guarding, VSS. Pt in no distress.    The patient appears reasonably screened and/or stabilized for discharge and I doubt any other medical condition or other [facility] requiring further screening, evaluation, or treatment in the ED at this time prior to discharge.   Final Clinical Impression(s) / ED Diagnoses Final diagnoses:  Dysuria  Perirectal skin irritation    Rx / DC Orders ED Discharge Orders         Ordered    clotrimazole-betamethasone (LOTRISONE) cream     05/14/19 1837           Evalee Jefferson, PA-C 05/14/19 1843    Milton Ferguson, MD 05/16/19 1004

## 2019-12-10 ENCOUNTER — Emergency Department (HOSPITAL_COMMUNITY)
Admission: EM | Admit: 2019-12-10 | Discharge: 2019-12-10 | Disposition: A | Discharge status: Home / Self Care | Payer: Medicare Other | Attending: Emergency Medicine | Admitting: Emergency Medicine

## 2019-12-10 ENCOUNTER — Encounter (HOSPITAL_COMMUNITY): Payer: Self-pay

## 2019-12-10 ENCOUNTER — Other Ambulatory Visit: Payer: Self-pay

## 2019-12-10 DIAGNOSIS — B379 Candidiasis, unspecified: Secondary | ICD-10-CM | POA: Insufficient documentation

## 2019-12-10 DIAGNOSIS — Z79899 Other long term (current) drug therapy: Secondary | ICD-10-CM | POA: Insufficient documentation

## 2019-12-10 DIAGNOSIS — E039 Hypothyroidism, unspecified: Secondary | ICD-10-CM | POA: Insufficient documentation

## 2019-12-10 DIAGNOSIS — L290 Pruritus ani: Secondary | ICD-10-CM | POA: Diagnosis present

## 2019-12-10 DIAGNOSIS — Z87891 Personal history of nicotine dependence: Secondary | ICD-10-CM | POA: Diagnosis not present

## 2019-12-10 DIAGNOSIS — B372 Candidiasis of skin and nail: Secondary | ICD-10-CM

## 2019-12-10 MED ORDER — FLUCONAZOLE 150 MG PO TABS
150.0000 mg | ORAL_TABLET | Freq: Once | ORAL | Status: AC
  Administered 2019-12-10: 150 mg via ORAL
  Filled 2019-12-10: qty 1

## 2019-12-10 MED ORDER — MICONAZOLE NITRATE 2 % EX CREA: 1.0000 "application " | TOPICAL_CREAM | Freq: Two times a day (BID) | CUTANEOUS | 0 refills | Status: DC

## 2019-12-10 NOTE — ED Provider Notes (Signed)
[facility] EMERGENCY DEPARTMENT Provider Note  CSN: [id number] Arrival date & time: 12/10/19 0932    History Chief Complaint  Patient presents with  . Rectal Pain    HPI  Charlene Vincent is a 85 y.o. female who presents for evaluation of rectal itching that she states began yesterday and worsened this morning that she attributes to a hemorrhoid but not improved with hemorrhoid cream at home. She denies any rectal bleeding or hard stools. No abdominal pain and no fever.    Past Medical History:  Diagnosis Date  . Anemia   . Arthritis   . Cellulitis   . Costochondritis   . Headache   . Hypothyroidism   . Myocardial infarction [facility]) 2012, 2016   both Takotsubo  . Pneumonia   . Shingles   . Takotsubo cardiomyopathy    a. 2012  b. LHC in 09/2014 nl cors, EF 30-35%>>45-50% by echo 11/2014  . Tremor, essential     Past Surgical History:  Procedure Laterality Date  . ABDOMINAL HYSTERECTOMY    . APPLICATION OF A-CELL OF EXTREMITY Left 01/22/2015   Procedure: APPLICATION OF A-CELL TO LEFT THIGH ;  Surgeon: Glenna Fellows, MD;  Location: [facility] OR;  Service: Plastics;  Laterality: Left;  . APPLICATION OF WOUND VAC Left 10/09/2014   Procedure: APPLICATION OF WOUND VAC;  Surgeon: Cammy Copa, MD;  Location: [facility] OR;  Service: Orthopedics;  Laterality: Left;  . CARDIAC CATHETERIZATION  September 10, 2010   EF 35 to 40%. No significant CAD. ? takotsubo cardiomyopathy  . CARDIAC CATHETERIZATION N/A 10/02/2014   Procedure: Left Heart Cath and Coronary Angiography;  Surgeon: Peter M Swaziland, MD;  Location: [facility] INVASIVE CV LAB;  Service: Cardiovascular;  Laterality: N/A;  . I & D EXTREMITY Left 10/09/2014   Procedure: DEBRIDEMENT LEFT FOOT;  Surgeon: Cammy Copa, MD;  Location: [facility] OR;  Service: Orthopedics;  Laterality: Left;  . INGUINAL HERNIA REPAIR    . SKIN FULL THICKNESS GRAFT Left 10/12/2014   Procedure: Debridement left foot, application integra, wound vacuum-assisted  closure;  Surgeon: Glenna Fellows, MD;  Location: [facility] OR;  Service: Plastics;  Laterality: Left;  . SKIN GRAFT Left 01/22/2015   left thigh to left foot  . SKIN SPLIT GRAFT Left 01/22/2015   Procedure:  SPLIT THICKNESS SKIN GRAFT FROM LEFT THIGH TO LEFT FOOT ;  Surgeon: Glenna Fellows, MD;  Location: [facility] OR;  Service: Plastics;  Laterality: Left;    Family History  Problem Relation Age of Onset  . Heart disease Father   . Heart disease Brother     Social History   Tobacco Use  . Smoking status: Former Smoker    Packs/day: 0.50    Types: Cigarettes    Quit date: 09/07/2010    Years since quitting: 9.2  . Smokeless tobacco: Never Used  Vaping Use  . Vaping Use: Never used  Substance Use Topics  . Alcohol use: No  . Drug use: No     Home Medications Prior to Admission medications   Medication Sig Start Date End Date Taking? Authorizing Provider  acetaminophen (TYLENOL) 500 MG tablet Take 1,000 mg by mouth every 6 (six) hours.    [provider]  atorvastatin (LIPITOR) 80 MG tablet Take 80 mg by mouth daily.    [provider]  carvedilol (COREG) 3.125 MG tablet Take 1 tablet (3.125 mg total) by mouth 2 (two) times daily with a meal. 10/12/14   Ghimire, Werner Lean, MD  clotrimazole-betamethasone (LOTRISONE) cream Apply to affected area 2 times daily prn 05/14/19   Idol, Raynelle Fanning, PA-C  doxepin (SINEQUAN) 10 MG capsule Take 10 mg by mouth daily as needed (itching).    [provider]  fluticasone (FLONASE) 50 MCG/ACT nasal spray Place 1-2 sprays into both nostrils daily. 09/02/16   [provider]  levothyroxine (SYNTHROID, LEVOTHROID) 88 MCG tablet Take 88 mcg by mouth daily. 04/08/16   [provider]  lisinopril (PRINIVIL,ZESTRIL) 2.5 MG tablet Take 1 tablet (2.5 mg total) by mouth daily. 10/12/14   Ghimire, Werner Lean, MD  miconazole (MICOTIN) 2 % cream Apply 1 application topically 2 (two) times daily. 12/10/19   Pollyann Savoy, MD    oxyCODONE-acetaminophen (PERCOCET/ROXICET) 5-325 MG tablet Take 0.5-1 tablets by mouth daily. Alternates taking one-half tablet daily with one tablet daily 04/14/16   [provider]     Allergies    Aspirin, Nystatin, and Penicillins   Review of Systems   Review of Systems A comprehensive review of systems was completed and negative except as noted in HPI.    Physical Exam BP (!) 125/59 (BP Location: Left Arm)   Pulse 69   Temp 98.8 F (37.1 C)   Resp 16   SpO2 99%   Physical Exam Vitals and nursing note reviewed.  Constitutional:      Appearance: Normal appearance.  HENT:     Head: Normocephalic and atraumatic.     Nose: Nose normal.     Mouth/Throat:     Mouth: Mucous membranes are moist.  Eyes:     Extraocular Movements: Extraocular movements intact.     Conjunctiva/sclera: Conjunctivae normal.  Cardiovascular:     Rate and Rhythm: Normal rate.  Pulmonary:     Effort: Pulmonary effort is normal.     Breath sounds: Normal breath sounds.  Abdominal:     General: Abdomen is flat.     Palpations: Abdomen is soft.     Tenderness: There is no abdominal tenderness.  Genitourinary:    Comments: No hemorrhoids or anal fissure, she has moderate peri-rectal erythema, induration and white plaques consistent with yeast infection Musculoskeletal:        General: No swelling. Normal range of motion.     Cervical back: Neck supple.  Skin:    General: Skin is warm and dry.  Neurological:     General: No focal deficit present.     Mental Status: She is alert.  Psychiatric:        Mood and Affect: Mood normal.      ED Results / Procedures / Treatments   Labs (all labs ordered are listed, but only abnormal results are displayed) Labs Reviewed - No data to display  EKG None  Radiology No results found.  Procedures Procedures  Medications Ordered in the ED Medications  fluconazole (DIFLUCAN) tablet 150 mg (has no administration in time range)      MDM Rules/Calculators/A&P MDM Patient's exam most consistent with peri-rectal yeast infection. Advised to use anti-fungal cream instead of hemorrhoid cream and will give a dose of oral diflucan prior to discharge.  ED Course  I have reviewed the triage vital signs and the nursing notes.  Pertinent labs & imaging results that were available during my care of the patient were reviewed by me and considered in my medical decision making (see chart for details).     Final Clinical Impression(s) / ED Diagnoses Final diagnoses:  Yeast infection of the skin    Rx /  DC Orders ED Discharge Orders         Ordered    miconazole (MICOTIN) 2 % cream  2 times daily        12/10/19 1038           Pollyann Savoy, MD 12/10/19 1038

## 2019-12-10 NOTE — ED Triage Notes (Signed)
Pt reports she woke up with rectal itching

## 2019-12-30 ENCOUNTER — Other Ambulatory Visit: Payer: Self-pay

## 2019-12-30 ENCOUNTER — Emergency Department (HOSPITAL_COMMUNITY)
Admission: EM | Admit: 2019-12-30 | Discharge: 2019-12-30 | Disposition: A | Discharge status: Home / Self Care | Payer: Medicare Other | Attending: Emergency Medicine | Admitting: Emergency Medicine

## 2019-12-30 ENCOUNTER — Encounter (HOSPITAL_COMMUNITY): Payer: Self-pay

## 2019-12-30 DIAGNOSIS — I10 Essential (primary) hypertension: Secondary | ICD-10-CM | POA: Diagnosis not present

## 2019-12-30 DIAGNOSIS — Z79899 Other long term (current) drug therapy: Secondary | ICD-10-CM | POA: Diagnosis not present

## 2019-12-30 DIAGNOSIS — B372 Candidiasis of skin and nail: Secondary | ICD-10-CM

## 2019-12-30 DIAGNOSIS — L22 Diaper dermatitis: Secondary | ICD-10-CM | POA: Diagnosis not present

## 2019-12-30 DIAGNOSIS — Z87891 Personal history of nicotine dependence: Secondary | ICD-10-CM | POA: Diagnosis not present

## 2019-12-30 DIAGNOSIS — K6289 Other specified diseases of anus and rectum: Secondary | ICD-10-CM | POA: Diagnosis present

## 2019-12-30 DIAGNOSIS — E039 Hypothyroidism, unspecified: Secondary | ICD-10-CM | POA: Diagnosis not present

## 2019-12-30 MED ORDER — LIDOCAINE 5 % EX OINT: 1.0000 "application " | TOPICAL_OINTMENT | Freq: Four times a day (QID) | CUTANEOUS | 0 refills | Status: DC | PRN

## 2019-12-30 MED ORDER — TERBINAFINE HCL 1 % EX CREA: 1.0000 "application " | TOPICAL_CREAM | Freq: Two times a day (BID) | CUTANEOUS | 0 refills | Status: AC

## 2019-12-30 NOTE — ED Provider Notes (Signed)
[facility] EMERGENCY DEPARTMENT Provider Note   CSN: [id number] Arrival date & time: 12/30/19  1255     History Chief Complaint  Patient presents with  . Rectal Pain    CHALYN AMESCUA is a 85 y.o. female who presents to the emergency department chief complaint of burning rectal pain. The patient was seen here on 1017 for the same complaint. She was diagnosed with yeast infection and given miconazole and oral Diflucan. She followed up on the 25th at her PCPs office and was again diagnosed with yeast infection and placed on miconazole again. Patient states that while she is taking the miconazole everything feels a lot better however she stopped the course yesterday and she states that she is having a lot of burning and she is very uncomfortable. She denies fevers, chills, pain with defecation.  HPI     Past Medical History:  Diagnosis Date  . Anemia   . Arthritis   . Cellulitis   . Costochondritis   . Headache   . Hypothyroidism   . Myocardial infarction [facility]) 2012, 2016   both Takotsubo  . Pneumonia   . Shingles   . Takotsubo cardiomyopathy    a. 2012  b. LHC in 09/2014 nl cors, EF 30-35%>>45-50% by echo 11/2014  . Tremor, essential     Patient Active Problem List   Diagnosis Date Noted  . Open wound of foot except toe(s) alone 01/22/2015  . Hyperlipidemia 10/25/2014  . STEMI (ST elevation myocardial infarction) (HCC) 10/09/2014  . Foot laceration   . Fever   . Acute MI (HCC) 10/02/2014  . Foot fracture 10/02/2014  . Stress-induced cardiomyopathy 10/02/2014  . Demand ischemia (HCC) 10/02/2014  . Takotsubo cardiomyopathy   . Hyponatremia 07/20/2011  . Leukopenia 07/20/2011  . Abscess or cellulitis of gluteal region 07/17/2011  . Hypothyroidism 07/17/2011  . Anemia 07/17/2011  . HTN (hypertension) 07/17/2011    Past Surgical History:  Procedure Laterality Date  . ABDOMINAL HYSTERECTOMY    . APPLICATION OF A-CELL OF EXTREMITY Left 01/22/2015   Procedure:  APPLICATION OF A-CELL TO LEFT THIGH ;  Surgeon: Glenna Fellows, MD;  Location: [facility] OR;  Service: Plastics;  Laterality: Left;  . APPLICATION OF WOUND VAC Left 10/09/2014   Procedure: APPLICATION OF WOUND VAC;  Surgeon: Cammy Copa, MD;  Location: [facility] OR;  Service: Orthopedics;  Laterality: Left;  . CARDIAC CATHETERIZATION  September 10, 2010   EF 35 to 40%. No significant CAD. ? takotsubo cardiomyopathy  . CARDIAC CATHETERIZATION N/A 10/02/2014   Procedure: Left Heart Cath and Coronary Angiography;  Surgeon: Peter M Swaziland, MD;  Location: [facility] INVASIVE CV LAB;  Service: Cardiovascular;  Laterality: N/A;  . I & D EXTREMITY Left 10/09/2014   Procedure: DEBRIDEMENT LEFT FOOT;  Surgeon: Cammy Copa, MD;  Location: [facility] OR;  Service: Orthopedics;  Laterality: Left;  . INGUINAL HERNIA REPAIR    . SKIN FULL THICKNESS GRAFT Left 10/12/2014   Procedure: Debridement left foot, application integra, wound vacuum-assisted closure;  Surgeon: Glenna Fellows, MD;  Location: [facility] OR;  Service: Plastics;  Laterality: Left;  . SKIN GRAFT Left 01/22/2015   left thigh to left foot  . SKIN SPLIT GRAFT Left 01/22/2015   Procedure:  SPLIT THICKNESS SKIN GRAFT FROM LEFT THIGH TO LEFT FOOT ;  Surgeon: Glenna Fellows, MD;  Location: [facility] OR;  Service: Plastics;  Laterality: Left;     OB History   No obstetric history on file.     Family History  Problem Relation Age of Onset  . Heart disease Father   . Heart disease Brother     Social History   Tobacco Use  . Smoking status: Former Smoker    Packs/day: 0.50    Types: Cigarettes    Quit date: 09/07/2010    Years since quitting: 9.3  . Smokeless tobacco: Never Used  Vaping Use  . Vaping Use: Never used  Substance Use Topics  . Alcohol use: No  . Drug use: No    Home Medications Prior to Admission medications   Medication Sig Start Date End Date Taking? Authorizing Provider  acetaminophen (TYLENOL) 500 MG tablet Take 1,000 mg by mouth every 6 (six)  hours.    [provider]  atorvastatin (LIPITOR) 80 MG tablet Take 80 mg by mouth daily.    [provider]  carvedilol (COREG) 3.125 MG tablet Take 1 tablet (3.125 mg total) by mouth 2 (two) times daily with a meal. 10/12/14   Ghimire, Werner Lean, MD  clotrimazole-betamethasone (LOTRISONE) cream Apply to affected area 2 times daily prn 05/14/19   Idol, Raynelle Fanning, PA-C  doxepin (SINEQUAN) 10 MG capsule Take 10 mg by mouth daily as needed (itching).    [provider]  fluticasone (FLONASE) 50 MCG/ACT nasal spray Place 1-2 sprays into both nostrils daily. 09/02/16   [provider]  levothyroxine (SYNTHROID, LEVOTHROID) 88 MCG tablet Take 88 mcg by mouth daily. 04/08/16   [provider]  lisinopril (PRINIVIL,ZESTRIL) 2.5 MG tablet Take 1 tablet (2.5 mg total) by mouth daily. 10/12/14   Ghimire, Werner Lean, MD  miconazole (MICOTIN) 2 % cream Apply 1 application topically 2 (two) times daily. 12/10/19   Pollyann Savoy, MD  oxyCODONE-acetaminophen (PERCOCET/ROXICET) 5-325 MG tablet Take 0.5-1 tablets by mouth daily. Alternates taking one-half tablet daily with one tablet daily 04/14/16   [provider]    Allergies    Aspirin, Nystatin, and Penicillins  Review of Systems   Review of Systems Ten systems reviewed and are negative for acute change, except as noted in the HPI.   Physical Exam Updated Vital Signs BP 122/64 (BP Location: Right Arm)   Pulse 68   Temp 98.3 F (36.8 C)   Resp 16   Ht 5\' 5"  (1.651 m)   Wt 54.4 kg   SpO2 100%   BMI 19.97 kg/m   Physical Exam Vitals and nursing note reviewed.  Constitutional:      General: She is not in acute distress.    Appearance: She is well-developed. She is not diaphoretic.  HENT:     Head: Normocephalic and atraumatic.  Eyes:     General: No scleral icterus.    Conjunctiva/sclera: Conjunctivae normal.  Cardiovascular:     Rate and Rhythm: Normal rate and regular rhythm.     Heart  sounds: Normal heart sounds. No murmur heard.  No friction rub. No gallop.   Pulmonary:     Effort: Pulmonary effort is normal. No respiratory distress.     Breath sounds: Normal breath sounds.  Abdominal:     General: Bowel sounds are normal. There is no distension.     Palpations: Abdomen is soft. There is no mass.     Tenderness: There is no abdominal tenderness. There is no guarding.  Genitourinary:    Comments: Chaperone present There is surrounding erythema with satellite lesions around the rectum. Patient is wearing an incontinence diaper which is quite moist. No obvious fissures, thrombosed hemorrhoids or rectal prolapse. No bleeding.  No induration or lymphangitis suggestive of cellulitis. Musculoskeletal:     Cervical back: Normal range of motion.  Skin:    General: Skin is warm and dry.  Neurological:     Mental Status: She is alert and oriented to person, place, and time.  Psychiatric:        Behavior: Behavior normal.     ED Results / Procedures / Treatments   Labs (all labs ordered are listed, but only abnormal results are displayed) Labs Reviewed - No data to display  EKG None  Radiology No results found.  Procedures Procedures (including critical care time)  Medications Ordered in ED Medications - No data to display  ED Course  I have reviewed the triage vital signs and the nursing notes.  Pertinent labs & imaging results that were available during my care of the patient were reviewed by me and considered in my medical decision making (see chart for details).    MDM Rules/Calculators/A&P                          Patient here with complaint of rectal burning. She continues to have what appears to be diaper rash around her rectum. She has a mitral pelvic floor weakness which I think contributes to the rectal region sitting flush with her wet diaper. She and I discussed that she needs to keep this area as dry as possible and either wear a pad which does  not touch her rectum or change her incontinence underwear every time it is wet. Furthermore she has gotten 2 doses of miconazole which is a [employer]ches static agent. We will switch her to terbinafine which is a [employer] sidle agent to use twice daily for 1 week. I also prescribed lidocaine ointment 5% which should provide some relief and moisture barrier. Patient is to follow closely with her primary care physician for further monitoring and evaluation. She appears otherwise appropriate for discharge at this time Final Clinical Impression(s) / ED Diagnoses Final diagnoses:  None    Rx / DC Orders ED Discharge Orders    None       Arthor Captain, PA-C 12/30/19 1426    Long, Arlyss Repress, MD 12/31/19 [phone]

## 2019-12-30 NOTE — Discharge Instructions (Signed)
Contact a health care provider if: Your symptoms go away and then return. Your symptoms do not get better with treatment. Your symptoms get worse. Your rash spreads. You have a fever or chills. You have new symptoms. You have new warmth or redness of your skin.

## 2019-12-30 NOTE — ED Triage Notes (Signed)
Pt reports she has been using cream the doctor gave her for rectal burning for 2 weeks. She thinks she has hemorrhoids

## 2020-03-21 NOTE — Progress Notes (Unsigned)
dPatient ID: Charlene Vincent, female   DOB: 10/14/1934, 86 y.o.   MRN: [id number]    Date:  03/25/2020   ID:  Charlene Vincent, DOB 07/16/1934, MRN [id number]  PCP:  Kaplan, Kristen W., PA-C  Primary Cardiologist:   Peter Jordan MD   Chief Complaint  Patient presents with  . Congestive Heart Failure     History of Present Illness: Charlene Vincent is a 86 y.o. female is seen for follow up Takotsubo CM. In August 2016 she presented with a foot injury from a mechanical fall. Her foot was treated and a laceration was repaired. An ECG was performed and was significantly abnormal. Her troponin was elevated. The ECG met STEMI criteria and she was transferred emergently to [facility] and taken directly to the cath lab.    Left heart catheterization  10/02/2014 revealed normal coronary arteries.  LVEF was 25-35%.   Hyperdynamic base consistent with Takotsubo  Syndrome.   She had an echocardiogram on 10/03/2014 which confirmed an ejection fraction of 30-35%. There is akinesis of the mid apical anteroseptal, inferior and apical myocardium. Grade 2 diastolic dysfunction. Moderate LVH. She had a similar episode of stress induced cardiomyopathy in 2012. EF returned to normal following that episode. Her most recent Echo in October 2016 showed an EF 45-50%.   On follow up today she is doing very well. Denies any chest pain , dyspnea, palpitations, edema. No fatigue. Her energy  Level is good.   Wt Readings from Last 3 Encounters:  03/25/20 108 lb 3.2 oz (49.1 kg)  12/30/19 120 lb (54.4 kg)  05/14/19 125 lb (56.7 kg)     Past Medical History:  Diagnosis Date  . Anemia   . Arthritis   . Cellulitis   . Costochondritis   . Headache   . Hypothyroidism   . Myocardial infarction (HCC) 2012, 2016   both Takotsubo  . Pneumonia   . Shingles   . Takotsubo cardiomyopathy    a. 2012  b. LHC in 09/2014 nl cors, EF 30-35%>>45-50% by echo 11/2014  . Tremor, essential     Current Outpatient Medications   Medication Sig Dispense Refill  . acetaminophen (TYLENOL) 500 MG tablet Take 1,000 mg by mouth every 6 (six) hours.    . atorvastatin (LIPITOR) 80 MG tablet Take 80 mg by mouth daily.    . doxepin (SINEQUAN) 10 MG capsule Take 10 mg by mouth daily as needed (itching).    . fluticasone (FLONASE) 50 MCG/ACT nasal spray Place 1-2 sprays into both nostrils daily.    . levothyroxine (SYNTHROID, LEVOTHROID) 88 MCG tablet Take 88 mcg by mouth daily.    . carvedilol (COREG) 3.125 MG tablet Take 1 tablet (3.125 mg total) by mouth 2 (two) times daily with a meal.    . lisinopril (ZESTRIL) 2.5 MG tablet Take 1 tablet (2.5 mg total) by mouth daily.     No current facility-administered medications for this visit.    Allergies:    Allergies  Allergen Reactions  . Aspirin     But taking low dose therapy without problems.  . Nystatin Itching    She said when she used the cream it made symptoms worse   . Penicillins Rash    Social History:  The patient  reports that she quit smoking about 9 years ago. Her smoking use included cigarettes. She smoked 0.50 packs per day. She has never used smokeless tobacco. She reports that she does not drink alcohol and does not use   drugs.   Family history:   Family History  Problem Relation Age of Onset  . Heart disease Father   . Heart disease Brother     ROS:  Please see the history of present illness.  All other systems reviewed and negative.   PHYSICAL EXAM: VS:  BP 126/76   Pulse 67   Ht 5' 7" (1.702 m)   Wt 108 lb 3.2 oz (49.1 kg)   SpO2 96%   BMI 16.95 kg/m  GENERAL:  Elderly WF in NAD HEENT:  PERRL, EOMI, sclera are clear. Oropharynx is clear. NECK:  No jugular venous distention, carotid upstroke brisk and symmetric, no bruits, no thyromegaly or adenopathy LUNGS:  Clear to auscultation bilaterally CHEST:  Unremarkable HEART:  RRR,  PMI not displaced or sustained,S1 and S2 within normal limits, no S3, no S4: no clicks, no rubs, no murmurs ABD:   Soft, nontender. BS +, no masses or bruits. No hepatomegaly, no splenomegaly EXT:  2 + pulses throughout, no edema, no cyanosis no clubbing SKIN:  Warm and dry.  No rashes NEURO:  Alert and oriented x 3. Cranial nerves II through XII intact. PSYCH:  Cognitively intact   Lipid Panel     Component Value Date/Time   CHOL 82 10/04/2014 0300   TRIG 67 10/04/2014 0300   HDL 32 (L) 10/04/2014 0300   CHOLHDL 2.6 10/04/2014 0300   VLDL 13 10/04/2014 0300   LDLCALC 37 10/04/2014 0300   Lab Results  Component Value Date   WBC 4.4 11/14/2017   HGB 12.2 11/14/2017   HCT 38.7 11/14/2017   PLT 150 11/14/2017   GLUCOSE 80 12/28/2017   CHOL 82 10/04/2014   TRIG 67 10/04/2014   HDL 32 (L) 10/04/2014   LDLCALC 37 10/04/2014   ALT 36 11/14/2017   AST 38 11/14/2017   NA 143 12/28/2017   K 3.5 12/28/2017   CL 102 12/28/2017   CREATININE 0.76 12/28/2017   BUN 11 12/28/2017   CO2 25 12/28/2017   TSH 4.635 (H) 10/07/2014   INR 0.97 09/10/2010   HGBA1C 5.6 09/10/2010    Dated 11/27/19: cholesterol 87, triglycerides 84, HDL 42, LDL 21. Sodium 132. Otherwise CMET normal. plts 149K otherwise CBC normal  Ecg today shows NSR rate 67. Normal.   I have personally reviewed and interpreted this study.   Echo 12/06/14: Study Conclusions  - Left ventricle: The cavity size was normal. There was mild focal   basal hypertrophy of the septum. Systolic function was mildly   reduced. The estimated ejection fraction was in the range of 45%   to 50%. Wall motion was normal; there were no regional wall   motion abnormalities. Doppler parameters are consistent with   abnormal left ventricular relaxation (grade 1 diastolic   dysfunction). There was no evidence of elevated ventricular   filling pressure by Doppler parameters. - Aortic valve: Trileaflet; normal thickness leaflets. There was no   regurgitation. - Aortic root: The aortic root was normal in size. - Mitral valve: There was no regurgitation. -  Right ventricle: The cavity size was normal. Wall thickness was   normal. Systolic function was normal. - Right atrium: The atrium was normal in size. - Pulmonary arteries: Systolic pressure was within the normal   range. - Inferior vena cava: The vessel was normal in size. - Pericardium, extracardiac: There was no pericardial effusion.  Impressions:  - Compared to the report from 10/04/2014 (images not available) LVEF   is now improved, estimated   at 45-50%. There is hypokinesis of the   mid and apical inferior walls.  ASSESSMENT AND PLAN:  1. Takotsubo's cardiomyopathy with prior acute episodes in 2012 with recurrence in 2016. No symptoms since then. She is asymptomatic. Mild LV dysfunction noted on last Echo. Will continue low dose Coreg and lisinopril. She is euvolemic. Follow up in one year   

## 2020-03-25 ENCOUNTER — Ambulatory Visit: Payer: Medicare Other | Admitting: Cardiology

## 2020-03-25 ENCOUNTER — Encounter: Payer: Self-pay | Admitting: Cardiology

## 2020-03-25 ENCOUNTER — Other Ambulatory Visit: Payer: Self-pay

## 2020-03-25 VITALS — BP 126/76 | HR 67 | Ht 67.0 in | Wt 108.2 lb

## 2020-03-25 DIAGNOSIS — I10 Essential (primary) hypertension: Secondary | ICD-10-CM

## 2020-03-25 DIAGNOSIS — I5181 Takotsubo syndrome: Secondary | ICD-10-CM | POA: Diagnosis not present

## 2020-03-25 DIAGNOSIS — E7800 Pure hypercholesterolemia, unspecified: Secondary | ICD-10-CM | POA: Diagnosis not present

## 2020-03-25 MED ORDER — CARVEDILOL 3.125 MG PO TABS: 3.1250 mg | ORAL_TABLET | Freq: Two times a day (BID) | ORAL | Status: DC

## 2020-03-25 MED ORDER — LISINOPRIL 2.5 MG PO TABS: 2.5000 mg | ORAL_TABLET | Freq: Every day | ORAL | Status: DC

## 2020-11-02 ENCOUNTER — Other Ambulatory Visit: Payer: Self-pay

## 2020-11-02 ENCOUNTER — Emergency Department (HOSPITAL_COMMUNITY)
Admission: EM | Admit: 2020-11-02 | Discharge: 2020-11-02 | Disposition: A | Discharge status: Home / Self Care | Payer: Medicare Other | Attending: Emergency Medicine | Admitting: Emergency Medicine

## 2020-11-02 ENCOUNTER — Encounter (HOSPITAL_COMMUNITY): Payer: Self-pay

## 2020-11-02 ENCOUNTER — Emergency Department (HOSPITAL_COMMUNITY): Payer: Medicare Other

## 2020-11-02 DIAGNOSIS — M79661 Pain in right lower leg: Secondary | ICD-10-CM | POA: Diagnosis present

## 2020-11-02 DIAGNOSIS — L03115 Cellulitis of right lower limb: Secondary | ICD-10-CM | POA: Diagnosis not present

## 2020-11-02 DIAGNOSIS — Z87891 Personal history of nicotine dependence: Secondary | ICD-10-CM | POA: Diagnosis not present

## 2020-11-02 DIAGNOSIS — E039 Hypothyroidism, unspecified: Secondary | ICD-10-CM | POA: Insufficient documentation

## 2020-11-02 DIAGNOSIS — Z79899 Other long term (current) drug therapy: Secondary | ICD-10-CM | POA: Insufficient documentation

## 2020-11-02 DIAGNOSIS — I10 Essential (primary) hypertension: Secondary | ICD-10-CM | POA: Diagnosis not present

## 2020-11-02 MED ORDER — DOXYCYCLINE HYCLATE 100 MG PO CAPS: 100.0000 mg | ORAL_CAPSULE | Freq: Two times a day (BID) | ORAL | 0 refills | Status: DC

## 2020-11-02 MED ORDER — BACITRACIN ZINC 500 UNIT/GM EX OINT
TOPICAL_OINTMENT | Freq: Two times a day (BID) | CUTANEOUS | Status: DC
  Administered 2020-11-02: 1 via TOPICAL
  Filled 2020-11-02: qty 0.9

## 2020-11-02 NOTE — ED Triage Notes (Signed)
Pt reports struck her r shin on a piece of furniture last Sunday.  Wound noted to shin, area swollen.  Shin and lower leg red.   Pedal pulse present.

## 2020-11-02 NOTE — ED Provider Notes (Signed)
[facility] EMERGENCY DEPARTMENT Provider Note   CSN: [id number] Arrival date & time: 11/02/20  1010     History Chief Complaint  Patient presents with   Leg Injury    Charlene Vincent is a 86 y.o. female with a past medical history significant for hypertension, STEMI, who presents with 1 week of right shin pain after hitting her leg on a piece of furniture last Sunday.  Patient reports that she has been ambulating without difficulty, however there is pain surrounding the wound.  Patient denies blood thinner use.  Patient denies any sensory changes to the leg.  HPI     Past Medical History:  Diagnosis Date   Anemia    Arthritis    Cellulitis    Costochondritis    Headache    Hypothyroidism    Myocardial infarction [facility]) 2012, 2016   both Takotsubo   Pneumonia    Shingles    Takotsubo cardiomyopathy    a. 2012  b. LHC in 09/2014 nl cors, EF 30-35%>>45-50% by echo 11/2014   Tremor, essential     Patient Active Problem List   Diagnosis Date Noted   Open wound of foot except toe(s) alone 01/22/2015   Hyperlipidemia 10/25/2014   STEMI (ST elevation myocardial infarction) (HCC) 10/09/2014   Foot laceration    Fever    Acute MI (HCC) 10/02/2014   Foot fracture 10/02/2014   Stress-induced cardiomyopathy 10/02/2014   Demand ischemia (HCC) 10/02/2014   Takotsubo cardiomyopathy    Hyponatremia 07/20/2011   Leukopenia 07/20/2011   Abscess or cellulitis of gluteal region 07/17/2011   Hypothyroidism 07/17/2011   Anemia 07/17/2011   HTN (hypertension) 07/17/2011    Past Surgical History:  Procedure Laterality Date   ABDOMINAL HYSTERECTOMY     APPLICATION OF A-CELL OF EXTREMITY Left 01/22/2015   Procedure: APPLICATION OF A-CELL TO LEFT THIGH ;  Surgeon: Glenna Fellows, MD;  Location: [facility] OR;  Service: Plastics;  Laterality: Left;   APPLICATION OF WOUND VAC Left 10/09/2014   Procedure: APPLICATION OF WOUND VAC;  Surgeon: Cammy Copa, MD;  Location: [facility] OR;   Service: Orthopedics;  Laterality: Left;   CARDIAC CATHETERIZATION  September 10, 2010   EF 35 to 40%. No significant CAD. ? takotsubo cardiomyopathy   CARDIAC CATHETERIZATION N/A 10/02/2014   Procedure: Left Heart Cath and Coronary Angiography;  Surgeon: Peter M Swaziland, MD;  [facility] INVASIVE CV LAB;  Service: Cardiovascular;  Laterality: N/A;   I & D EXTREMITY Left 10/09/2014   Procedure: DEBRIDEMENT LEFT FOOT;  Surgeon: Cammy Copa, MD;  Location: [facility] OR;  Service: Orthopedics;  Laterality: Left;   INGUINAL HERNIA REPAIR     SKIN FULL THICKNESS GRAFT Left 10/12/2014   Procedure: Debridement left foot, application integra, wound vacuum-assisted closure;  Surgeon: Glenna Fellows, MD;  Location: [facility] OR;  Service: Plastics;  Laterality: Left;   SKIN GRAFT Left 01/22/2015   left thigh to left foot   SKIN SPLIT GRAFT Left 01/22/2015   Procedure:  SPLIT THICKNESS SKIN GRAFT FROM LEFT THIGH TO LEFT FOOT ;  Surgeon: Glenna Fellows, MD;  Location: [facility] OR;  Service: Plastics;  Laterality: Left;     OB History   No obstetric history on file.     Family History  Problem Relation Age of Onset   Heart disease Father    Heart disease Brother     Social History   Tobacco Use   Smoking status: Former    Packs/day: 0.50  Types: Cigarettes    Quit date: 09/07/2010    Years since quitting: 10.1   Smokeless tobacco: Never  Vaping Use   Vaping Use: Never used  Substance Use Topics   Alcohol use: No   Drug use: No    Home Medications Prior to Admission medications   Medication Sig Start Date End Date Taking? Authorizing Provider  doxycycline (VIBRAMYCIN) 100 MG capsule Take 1 capsule (100 mg total) by mouth 2 (two) times daily. 11/02/20  Yes Elfriede Bonini H, PA-C  acetaminophen (TYLENOL) 500 MG tablet Take 1,000 mg by mouth every 6 (six) hours.    [provider]  atorvastatin (LIPITOR) 80 MG tablet Take 80 mg by mouth daily.    [provider]  carvedilol  (COREG) 3.125 MG tablet Take 1 tablet (3.125 mg total) by mouth 2 (two) times daily with a meal. 03/25/20   [place], Peter M, MD  doxepin (SINEQUAN) 10 MG capsule Take 10 mg by mouth daily as needed (itching).    [provider]  fluticasone (FLONASE) 50 MCG/ACT nasal spray Place 1-2 sprays into both nostrils daily. 09/02/16   [provider]  levothyroxine (SYNTHROID, LEVOTHROID) 88 MCG tablet Take 88 mcg by mouth daily. 04/08/16   [provider]  lisinopril (ZESTRIL) 2.5 MG tablet Take 1 tablet (2.5 mg total) by mouth daily. 03/25/20   [place], Peter M, MD    Allergies    Aspirin, Nystatin, and Penicillins  Review of Systems   Review of Systems  Skin:  Positive for color change and wound.  All other systems reviewed and are negative.  Physical Exam Updated Vital Signs BP (!) 146/67 (BP Location: Right Arm)   Pulse (!) 57   Temp 98 F (36.7 C) (Oral)   Resp 14   Ht 5\' 7"  (1.702 m)   Wt 57.2 kg   SpO2 99%   BMI 19.73 kg/m   Physical Exam Vitals and nursing note reviewed.  Constitutional:      General: She is not in acute distress.    Appearance: Normal appearance.  HENT:     Head: Normocephalic and atraumatic.  Eyes:     General:        Right eye: No discharge.        Left eye: No discharge.  Cardiovascular:     Rate and Rhythm: Normal rate and regular rhythm.     Comments: DP, PT pulses 2+ in both legs Pulmonary:     Effort: Pulmonary effort is normal. No respiratory distress.  Musculoskeletal:        General: No deformity.     Comments: Strength 5 out of 5 in right leg and foot, with some pain at shin.  Skin:    General: Skin is warm and dry.     Capillary Refill: Capillary refill takes less than 2 seconds.     Comments: Large area of well demarcated redness, heat, without purulent drainage extending from proximal shin on the anterior aspect of the right leg to the ankle of the right leg.  There is an approximately 2 to 3 cm wound at the  proximal aspect of the anterior shin, with evidence of dried blood.  Neurological:     Mental Status: She is alert and oriented to person, place, and time.  Psychiatric:        Mood and Affect: Mood normal.        Behavior: Behavior normal.    ED Results / Procedures / Treatments  Labs (all labs ordered are listed, but only abnormal results are displayed) Labs Reviewed - No data to display  EKG None  Radiology DG Tibia/Fibula Right  Result Date: 11/02/2020 CLINICAL DATA:  Trauma last week.  Redness. EXAM: RIGHT TIBIA AND FIBULA - 2 VIEW COMPARISON:  05/03/2016 ankle films. FINDINGS: Mild osteopenia. No acute fracture or dislocation. Degenerative changes about the knee are mild. Suspect mild pretibial soft tissue swelling on the lateral view. Probable soft tissue calcification just cephalad to this area anteriorly. No knee joint effusion. Tibiotalar mild osteoarthritis as well. IMPRESSION: No acute osseous abnormality. Suspicion of mild pretibial soft tissue swelling possible soft tissue calcification. Correlate with physical exam to exclude unlikely foreign body. Electronically Signed   By: Jeronimo Greaves M.D.   On: 11/02/2020 12:24    Procedures Procedures   Medications Ordered in ED Medications  bacitracin ointment (1 application Topical Given 11/02/20 1233)    ED Course  I have reviewed the triage vital signs and the nursing notes.  Pertinent labs & imaging results that were available during my care of the patient were reviewed by me and considered in my medical decision making (see chart for details).    MDM Rules/Calculators/A&P                         Presentation consistent with cellulitis given well demarcated redness, heat, some swelling.  Patient without excessive pain to the area, with strong DP and PT pulses, do not favor compartment syndrome. Will treat with antibiotics.  Radiographic imaging shows no evidence of free air, no fracture.  There is evidence of a wound at  the proximal aspect of the lower leg that would have benefited from primary closure if patient had presented immediately after the injury.  At this time discussed that we would not stitch the leg in the emergency setting. Patient may require secondary wound closure, wound clinic if leg wound is not healing well after the resolution of cellulitis.  Return precautions given, wound bandaged with a sterile dressing, and bacitracin prior to discharge. Final Clinical Impression(s) / ED Diagnoses Final diagnoses:  Cellulitis of right lower extremity    Rx / DC Orders ED Discharge Orders          Ordered    doxycycline (VIBRAMYCIN) 100 MG capsule  2 times daily        11/02/20 1219             Avantika Shere, Edyth Gunnels 11/02/20 1248    Eber Hong, MD 11/03/20 [phone]

## 2020-11-02 NOTE — Discharge Instructions (Signed)
As we discussed there are signs of an infection today based on our examination. Your xray did not show a fracture or any evidence of infection in the bone. The antibiotics I prescribed can cause some stomach upset, I recommend taking them on a full stomach. Additionally, they can cause some sun sensitivity, so be careful to wear sunscreen while taking them.  Please use Tylenol or ibuprofen for pain.  You may use 600 mg ibuprofen every 6 hours or 1000 mg of Tylenol every 6 hours.  You may choose to alternate between the 2.  This would be most effective.  Not to exceed 4 g of Tylenol within 24 hours.  Not to exceed 3200 mg ibuprofen 24 hours.   Please follow up with your primary care doctor in 3-5 days for a wound check.

## 2020-11-02 NOTE — ED Notes (Signed)
Pt/family provided discharge instructions and prescription information. Pt was given the opportunity to ask questions and questions were answered. Discharge signature not obtained in the setting of the COVID-19 pandemic in order to reduce high touch surfaces.

## 2020-11-02 NOTE — ED Provider Notes (Signed)
Medical screening examination/treatment/procedure(s) were conducted as a shared visit with non-physician practitioner(s) and myself.  I personally evaluated the patient during the encounter.  Clinical Impression:   Final diagnoses:  None    This patient is an 86 year old female, history of hypertension hypothyroidism, presents with a complaint of right lower extremity wound after striking her shin on a piece of furniture sometime in the last week, the patient is not very good with dates but it is been multiple days.  There is no active bleeding but a small amount of serosanguineous drainage from the wound which is triangular in shape, it would likely have benefited from a closure head she presented in a reasonable timeframe but since it has been multiple days it is not amenable to closure at this time.  She will need an antibiotic due to some spreading warmth and redness down her leg, this appears to be a cellulitis however she is not tachycardic or febrile or hypotensive.  X-ray reveals no fractures or subcutaneous emphysema, antibiotics will be given for home, she can follow-up outpatient to have the wound reevaluated, may need referral for formal closure at a later time  Family agreeable.   Eber Hong, MD 11/03/20 [phone]

## 2020-11-06 ENCOUNTER — Emergency Department (HOSPITAL_COMMUNITY): Payer: Medicare Other

## 2020-11-06 ENCOUNTER — Encounter (HOSPITAL_COMMUNITY): Payer: Self-pay

## 2020-11-06 ENCOUNTER — Inpatient Hospital Stay (HOSPITAL_COMMUNITY)
Admission: EM | Admit: 2020-11-06 | Discharge: 2020-11-09 | DRG: 602 | Disposition: A | Discharge status: Home / Self Care | Payer: Medicare Other | Attending: Internal Medicine | Admitting: Internal Medicine

## 2020-11-06 ENCOUNTER — Other Ambulatory Visit: Payer: Self-pay

## 2020-11-06 DIAGNOSIS — R339 Retention of urine, unspecified: Secondary | ICD-10-CM | POA: Diagnosis not present

## 2020-11-06 DIAGNOSIS — Z888 Allergy status to other drugs, medicaments and biological substances status: Secondary | ICD-10-CM

## 2020-11-06 DIAGNOSIS — G250 Essential tremor: Secondary | ICD-10-CM | POA: Diagnosis present

## 2020-11-06 DIAGNOSIS — Z8249 Family history of ischemic heart disease and other diseases of the circulatory system: Secondary | ICD-10-CM

## 2020-11-06 DIAGNOSIS — E039 Hypothyroidism, unspecified: Secondary | ICD-10-CM | POA: Diagnosis present

## 2020-11-06 DIAGNOSIS — R609 Edema, unspecified: Secondary | ICD-10-CM

## 2020-11-06 DIAGNOSIS — Z90710 Acquired absence of both cervix and uterus: Secondary | ICD-10-CM

## 2020-11-06 DIAGNOSIS — Z79890 Hormone replacement therapy (postmenopausal): Secondary | ICD-10-CM

## 2020-11-06 DIAGNOSIS — L03115 Cellulitis of right lower limb: Secondary | ICD-10-CM | POA: Diagnosis not present

## 2020-11-06 DIAGNOSIS — Z880 Allergy status to penicillin: Secondary | ICD-10-CM

## 2020-11-06 DIAGNOSIS — Z79899 Other long term (current) drug therapy: Secondary | ICD-10-CM

## 2020-11-06 DIAGNOSIS — Z87891 Personal history of nicotine dependence: Secondary | ICD-10-CM

## 2020-11-06 DIAGNOSIS — Z20822 Contact with and (suspected) exposure to covid-19: Secondary | ICD-10-CM | POA: Diagnosis present

## 2020-11-06 DIAGNOSIS — R109 Unspecified abdominal pain: Secondary | ICD-10-CM | POA: Diagnosis present

## 2020-11-06 DIAGNOSIS — E785 Hyperlipidemia, unspecified: Secondary | ICD-10-CM | POA: Diagnosis present

## 2020-11-06 DIAGNOSIS — K5641 Fecal impaction: Secondary | ICD-10-CM | POA: Diagnosis present

## 2020-11-06 DIAGNOSIS — Z886 Allergy status to analgesic agent status: Secondary | ICD-10-CM

## 2020-11-06 DIAGNOSIS — E876 Hypokalemia: Secondary | ICD-10-CM | POA: Diagnosis present

## 2020-11-06 DIAGNOSIS — I2510 Atherosclerotic heart disease of native coronary artery without angina pectoris: Secondary | ICD-10-CM | POA: Diagnosis present

## 2020-11-06 DIAGNOSIS — E43 Unspecified severe protein-calorie malnutrition: Secondary | ICD-10-CM | POA: Diagnosis present

## 2020-11-06 DIAGNOSIS — I252 Old myocardial infarction: Secondary | ICD-10-CM

## 2020-11-06 DIAGNOSIS — R319 Hematuria, unspecified: Secondary | ICD-10-CM | POA: Diagnosis not present

## 2020-11-06 DIAGNOSIS — I10 Essential (primary) hypertension: Secondary | ICD-10-CM | POA: Diagnosis present

## 2020-11-06 DIAGNOSIS — L0390 Cellulitis, unspecified: Secondary | ICD-10-CM | POA: Diagnosis present

## 2020-11-06 LAB — CBC WITH DIFFERENTIAL/PLATELET
Abs Immature Granulocytes: 0 10*3/uL (ref 0.00–0.07)
Basophils Absolute: 0 10*3/uL (ref 0.0–0.1)
Basophils Relative: 1 %
Eosinophils Absolute: 0.1 10*3/uL (ref 0.0–0.5)
Eosinophils Relative: 2 %
HCT: 36.4 % (ref 36.0–46.0)
Hemoglobin: 11.6 g/dL — ABNORMAL LOW (ref 12.0–15.0)
Immature Granulocytes: 0 %
Lymphocytes Relative: 23 %
Lymphs Abs: 1.2 10*3/uL (ref 0.7–4.0)
MCH: 31.3 pg (ref 26.0–34.0)
MCHC: 31.9 g/dL (ref 30.0–36.0)
MCV: 98.1 fL (ref 80.0–100.0)
Monocytes Absolute: 0.8 10*3/uL (ref 0.1–1.0)
Monocytes Relative: 16 %
Neutro Abs: 3.1 10*3/uL (ref 1.7–7.7)
Neutrophils Relative %: 58 %
Platelets: 159 10*3/uL (ref 150–400)
RBC: 3.71 MIL/uL — ABNORMAL LOW (ref 3.87–5.11)
RDW: 13.6 % (ref 11.5–15.5)
WBC: 5.2 10*3/uL (ref 4.0–10.5)
nRBC: 0 % (ref 0.0–0.2)

## 2020-11-06 LAB — BASIC METABOLIC PANEL
Anion gap: 5 (ref 5–15)
BUN: 11 mg/dL (ref 8–23)
CO2: 27 mmol/L (ref 22–32)
Calcium: 9 mg/dL (ref 8.9–10.3)
Chloride: 100 mmol/L (ref 98–111)
Creatinine, Ser: 0.69 mg/dL (ref 0.44–1.00)
GFR, Estimated: >60 mL/min (ref >60)
Glucose, Bld: 101 mg/dL — ABNORMAL HIGH (ref 70–99)
Potassium: 3.2 mmol/L — ABNORMAL LOW (ref 3.5–5.1)
Sodium: 132 mmol/L — ABNORMAL LOW (ref 135–145)

## 2020-11-06 LAB — RESP PANEL BY RT-PCR (FLU A&B, COVID) ARPGX2
Influenza A by PCR: NEGATIVE
Influenza B by PCR: NEGATIVE
SARS Coronavirus 2 by RT PCR: NEGATIVE

## 2020-11-06 LAB — LACTIC ACID, PLASMA: Lactic Acid, Venous: 1.7 mmol/L (ref 0.5–1.9)

## 2020-11-06 MED ORDER — POTASSIUM CHLORIDE 10 MEQ/100ML IV SOLN
10.0000 meq | INTRAVENOUS | Status: DC
Start: 2020-11-06 — End: 2020-11-07
  Administered 2020-11-06: 10 meq via INTRAVENOUS
  Filled 2020-11-06 (×2): qty 100

## 2020-11-06 MED ORDER — ACETAMINOPHEN 325 MG PO TABS
650.0000 mg | ORAL_TABLET | Freq: Once | ORAL | Status: AC
  Administered 2020-11-06: 650 mg via ORAL
  Filled 2020-11-06: qty 2

## 2020-11-06 MED ORDER — CEFAZOLIN SODIUM-DEXTROSE 1-4 GM/50ML-% IV SOLN
1.0000 g | Freq: Once | INTRAVENOUS | Status: AC
  Administered 2020-11-06: 1 g via INTRAVENOUS
  Filled 2020-11-06 (×2): qty 50

## 2020-11-06 NOTE — ED Provider Notes (Signed)
[facility] EMERGENCY DEPARTMENT Provider Note   CSN: [id number] Arrival date & time: 11/06/20  1801     History Chief Complaint  Patient presents with   Leg Pain    Charlene Vincent is a 86 y.o. female.  Patient presents with pain and redness to right lower.  She states she scraped it on furniture about a week ago.  She was seen here in the ER, and discharged home with oral antibiotics.  She finished them, but has had increased swelling and pain to the leg.  No reports of fevers, vomiting.      Past Medical History:  Diagnosis Date   Anemia    Arthritis    Cellulitis    Costochondritis    Headache    Hypothyroidism    Myocardial infarction [facility]) 2012, 2016   both Takotsubo   Pneumonia    Shingles    Takotsubo cardiomyopathy    a. 2012  b. LHC in 09/2014 nl cors, EF 30-35%>>45-50% by echo 11/2014   Tremor, essential     Patient Active Problem List   Diagnosis Date Noted   Open wound of foot except toe(s) alone 01/22/2015   Hyperlipidemia 10/25/2014   STEMI (ST elevation myocardial infarction) (HCC) 10/09/2014   Foot laceration    Fever    Acute MI (HCC) 10/02/2014   Foot fracture 10/02/2014   Stress-induced cardiomyopathy 10/02/2014   Demand ischemia (HCC) 10/02/2014   Takotsubo cardiomyopathy    Hyponatremia 07/20/2011   Leukopenia 07/20/2011   Abscess or cellulitis of gluteal region 07/17/2011   Hypothyroidism 07/17/2011   Anemia 07/17/2011   HTN (hypertension) 07/17/2011    Past Surgical History:  Procedure Laterality Date   ABDOMINAL HYSTERECTOMY     APPLICATION OF A-CELL OF EXTREMITY Left 01/22/2015   Procedure: APPLICATION OF A-CELL TO LEFT THIGH ;  Surgeon: Glenna Fellows, MD;  Location: [facility] OR;  Service: Plastics;  Laterality: Left;   APPLICATION OF WOUND VAC Left 10/09/2014   Procedure: APPLICATION OF WOUND VAC;  Surgeon: Cammy Copa, MD;  Location: [facility] OR;  Service: Orthopedics;  Laterality: Left;   CARDIAC CATHETERIZATION  September 10, 2010   EF 35 to 40%. No significant CAD. ? takotsubo cardiomyopathy   CARDIAC CATHETERIZATION N/A 10/02/2014   Procedure: Left Heart Cath and Coronary Angiography;  Surgeon: Peter M Swaziland, MD;  Location: [facility] INVASIVE CV LAB;  Service: Cardiovascular;  Laterality: N/A;   I & D EXTREMITY Left 10/09/2014   Procedure: DEBRIDEMENT LEFT FOOT;  Surgeon: Cammy Copa, MD;  Location: [facility];  Service: Orthopedics;  Laterality: Left;   INGUINAL HERNIA REPAIR     SKIN FULL THICKNESS GRAFT Left 10/12/2014   Procedure: Debridement left foot, application integra, wound vacuum-assisted closure;  Surgeon: Glenna Fellows, MD;  Location: [facility] OR;  Service: Plastics;  Laterality: Left;   SKIN GRAFT Left 01/22/2015   left thigh to left foot   SKIN SPLIT GRAFT Left 01/22/2015   Procedure:  SPLIT THICKNESS SKIN GRAFT FROM LEFT THIGH TO LEFT FOOT ;  Surgeon: Glenna Fellows, MD;  Location: [facility] OR;  Service: Plastics;  Laterality: Left;     OB History   No obstetric history on file.     Family History  Problem Relation Age of Onset   Heart disease Father    Heart disease Brother     Social History   Tobacco Use   Smoking status: Former    Packs/day: 0.50    Types: Cigarettes  Quit date: 09/07/2010    Years since quitting: 10.1   Smokeless tobacco: Never  Vaping Use   Vaping Use: Never used  Substance Use Topics   Alcohol use: No   Drug use: No    Home Medications Prior to Admission medications   Medication Sig Start Date End Date Taking? Authorizing Provider  acetaminophen (TYLENOL) 500 MG tablet Take 1,000 mg by mouth every 6 (six) hours.    [provider]  atorvastatin (LIPITOR) 80 MG tablet Take 80 mg by mouth daily.    [provider]  carvedilol (COREG) 3.125 MG tablet Take 1 tablet (3.125 mg total) by mouth 2 (two) times daily with a meal. 03/25/20   [place], Peter M, MD  doxepin (SINEQUAN) 10 MG capsule Take 10 mg by mouth daily as needed (itching).    [provider]  doxycycline (VIBRAMYCIN) 100 MG capsule Take 1 capsule (100 mg total) by mouth 2 (two) times daily. 11/02/20   Prosperi, Christian H, PA-C  fluticasone (FLONASE) 50 MCG/ACT nasal spray Place 1-2 sprays into both nostrils daily. 09/02/16   [provider]  levothyroxine (SYNTHROID, LEVOTHROID) 88 MCG tablet Take 88 mcg by mouth daily. 04/08/16   [provider]  lisinopril (ZESTRIL) 2.5 MG tablet Take 1 tablet (2.5 mg total) by mouth daily. 03/25/20   [place], Peter M, MD    Allergies    Aspirin, Nystatin, and Penicillins  Review of Systems   Review of Systems  Constitutional:  Negative for fever.  HENT:  Negative for ear pain.   Eyes:  Negative for pain.  Respiratory:  Negative for cough.   Cardiovascular:  Negative for chest pain.  Gastrointestinal:  Negative for abdominal pain.  Genitourinary:  Negative for flank pain.  Musculoskeletal:  Negative for back pain.  Skin:  Positive for wound.  Neurological:  Negative for headaches.   Physical Exam Updated Vital Signs BP (!) 142/74   Pulse 62   Temp 98.2 F (36.8 C) (Oral)   Resp 18   Ht 5\' 7"  (1.702 m)   Wt 57.6 kg   SpO2 100%   BMI 19.89 kg/m   Physical Exam Constitutional:      General: She is not in acute distress.    Appearance: Normal appearance.  HENT:     Head: Normocephalic.     Nose: Nose normal.  Eyes:     Extraocular Movements: Extraocular movements intact.  Cardiovascular:     Rate and Rhythm: Normal rate.  Pulmonary:     Effort: Pulmonary effort is normal.  Musculoskeletal:        General: Normal range of motion.     Cervical back: Normal range of motion.  Skin:    Comments: RLE mid tibia open wound approx 3x3 cm.  Surrounding erythema, warmth, TTP, approx 25cm x 10cm.  Compartments soft, NV intact.  Neurological:     General: No focal deficit present.     Mental Status: She is alert. Mental status is at baseline.    ED Results / Procedures / Treatments   Labs (all  labs ordered are listed, but only abnormal results are displayed) Labs Reviewed  CBC WITH DIFFERENTIAL/PLATELET - Abnormal; Notable for the following components:      Result Value   RBC 3.71 (*)    Hemoglobin 11.6 (*)    All other components within normal limits  BASIC METABOLIC PANEL - Abnormal; Notable for the following components:   Sodium 132 (*)    Potassium 3.2 (*)  Glucose, Bld 101 (*)    All other components within normal limits  CULTURE, BLOOD (ROUTINE X 2)  CULTURE, BLOOD (ROUTINE X 2)  RESP PANEL BY RT-PCR (FLU A&B, COVID) ARPGX2  LACTIC ACID, PLASMA  LACTIC ACID, PLASMA    EKG None  Radiology No results found.  Procedures Procedures   Medications Ordered in ED Medications  ceFAZolin (ANCEF) IVPB 1 g/50 mL premix (1 g Intravenous New Bag/Given 11/06/20 2214)  acetaminophen (TYLENOL) tablet 650 mg (650 mg Oral Given 11/06/20 2142)    ED Course  I have reviewed the triage vital signs and the nursing notes.  Pertinent labs & imaging results that were available during my care of the patient were reviewed by me and considered in my medical decision making (see chart for details).    MDM Rules/Calculators/A&P                           Labs show normal WBC, normal chemistry.    Patient presents with significant cellulitis to RLE.    Blood cultures and ancillary imaging sent.  Started on IV ancef.  Admit to hospitalist.   Final Clinical Impression(s) / ED Diagnoses Final diagnoses:  Cellulitis of right lower extremity    Rx / DC Orders ED Discharge Orders     None        Cheryll Cockayne, MD 11/06/20 2243

## 2020-11-06 NOTE — ED Triage Notes (Addendum)
Pt to er, pt states that she is here for R leg pain.  States that she takes a pain pill at night and then she can go to bed and it doesn't hurt anymore.  Pt has some redness to her R leg, swelling and an open wound

## 2020-11-06 NOTE — ED Notes (Signed)
Pt changed, cleaned and purwick in place. Denies any other needs at this time.

## 2020-11-07 ENCOUNTER — Observation Stay (HOSPITAL_COMMUNITY): Payer: Medicare Other

## 2020-11-07 DIAGNOSIS — R319 Hematuria, unspecified: Secondary | ICD-10-CM | POA: Diagnosis not present

## 2020-11-07 DIAGNOSIS — Z880 Allergy status to penicillin: Secondary | ICD-10-CM | POA: Diagnosis not present

## 2020-11-07 DIAGNOSIS — E039 Hypothyroidism, unspecified: Secondary | ICD-10-CM | POA: Diagnosis present

## 2020-11-07 DIAGNOSIS — I2510 Atherosclerotic heart disease of native coronary artery without angina pectoris: Secondary | ICD-10-CM | POA: Diagnosis present

## 2020-11-07 DIAGNOSIS — E43 Unspecified severe protein-calorie malnutrition: Secondary | ICD-10-CM | POA: Diagnosis present

## 2020-11-07 DIAGNOSIS — Z90710 Acquired absence of both cervix and uterus: Secondary | ICD-10-CM | POA: Diagnosis not present

## 2020-11-07 DIAGNOSIS — Z888 Allergy status to other drugs, medicaments and biological substances status: Secondary | ICD-10-CM | POA: Diagnosis not present

## 2020-11-07 DIAGNOSIS — E876 Hypokalemia: Secondary | ICD-10-CM | POA: Diagnosis present

## 2020-11-07 DIAGNOSIS — E038 Other specified hypothyroidism: Secondary | ICD-10-CM

## 2020-11-07 DIAGNOSIS — L03115 Cellulitis of right lower limb: Principal | ICD-10-CM

## 2020-11-07 DIAGNOSIS — R339 Retention of urine, unspecified: Secondary | ICD-10-CM | POA: Diagnosis not present

## 2020-11-07 DIAGNOSIS — I252 Old myocardial infarction: Secondary | ICD-10-CM | POA: Diagnosis not present

## 2020-11-07 DIAGNOSIS — Z8249 Family history of ischemic heart disease and other diseases of the circulatory system: Secondary | ICD-10-CM | POA: Diagnosis not present

## 2020-11-07 DIAGNOSIS — Z20822 Contact with and (suspected) exposure to covid-19: Secondary | ICD-10-CM | POA: Diagnosis present

## 2020-11-07 DIAGNOSIS — R609 Edema, unspecified: Secondary | ICD-10-CM | POA: Diagnosis present

## 2020-11-07 DIAGNOSIS — G250 Essential tremor: Secondary | ICD-10-CM | POA: Diagnosis present

## 2020-11-07 DIAGNOSIS — Z87891 Personal history of nicotine dependence: Secondary | ICD-10-CM | POA: Diagnosis not present

## 2020-11-07 DIAGNOSIS — K5641 Fecal impaction: Secondary | ICD-10-CM | POA: Diagnosis present

## 2020-11-07 DIAGNOSIS — I10 Essential (primary) hypertension: Secondary | ICD-10-CM | POA: Diagnosis present

## 2020-11-07 DIAGNOSIS — R1031 Right lower quadrant pain: Secondary | ICD-10-CM

## 2020-11-07 DIAGNOSIS — E785 Hyperlipidemia, unspecified: Secondary | ICD-10-CM | POA: Diagnosis present

## 2020-11-07 DIAGNOSIS — Z886 Allergy status to analgesic agent status: Secondary | ICD-10-CM | POA: Diagnosis not present

## 2020-11-07 DIAGNOSIS — R109 Unspecified abdominal pain: Secondary | ICD-10-CM | POA: Diagnosis present

## 2020-11-07 DIAGNOSIS — Z79890 Hormone replacement therapy (postmenopausal): Secondary | ICD-10-CM | POA: Diagnosis not present

## 2020-11-07 DIAGNOSIS — Z79899 Other long term (current) drug therapy: Secondary | ICD-10-CM | POA: Diagnosis not present

## 2020-11-07 LAB — COMPREHENSIVE METABOLIC PANEL
ALT: 41 U/L (ref 0–44)
AST: 44 U/L — ABNORMAL HIGH (ref 15–41)
Albumin: 3.3 g/dL — ABNORMAL LOW (ref 3.5–5.0)
Alkaline Phosphatase: 74 U/L (ref 38–126)
Anion gap: 5 (ref 5–15)
BUN: 9 mg/dL (ref 8–23)
CO2: 27 mmol/L (ref 22–32)
Calcium: 8.2 mg/dL — ABNORMAL LOW (ref 8.9–10.3)
Chloride: 101 mmol/L (ref 98–111)
Creatinine, Ser: 0.55 mg/dL (ref 0.44–1.00)
GFR, Estimated: >60 mL/min (ref >60)
Glucose, Bld: 103 mg/dL — ABNORMAL HIGH (ref 70–99)
Potassium: 3.1 mmol/L — ABNORMAL LOW (ref 3.5–5.1)
Sodium: 133 mmol/L — ABNORMAL LOW (ref 135–145)
Total Bilirubin: 0.5 mg/dL (ref 0.3–1.2)
Total Protein: 6.4 g/dL — ABNORMAL LOW (ref 6.5–8.1)

## 2020-11-07 LAB — CBC
HCT: 35.4 % — ABNORMAL LOW (ref 36.0–46.0)
Hemoglobin: 11.3 g/dL — ABNORMAL LOW (ref 12.0–15.0)
MCH: 31.1 pg (ref 26.0–34.0)
MCHC: 31.9 g/dL (ref 30.0–36.0)
MCV: 97.5 fL (ref 80.0–100.0)
Platelets: 154 10*3/uL (ref 150–400)
RBC: 3.63 MIL/uL — ABNORMAL LOW (ref 3.87–5.11)
RDW: 13.2 % (ref 11.5–15.5)
WBC: 9.2 10*3/uL (ref 4.0–10.5)
nRBC: 0 % (ref 0.0–0.2)

## 2020-11-07 LAB — URINALYSIS, ROUTINE W REFLEX MICROSCOPIC
Bilirubin Urine: NEGATIVE
Glucose, UA: NEGATIVE mg/dL
Ketones, ur: NEGATIVE mg/dL
Nitrite: NEGATIVE
Protein, ur: NEGATIVE mg/dL
Specific Gravity, Urine: 1.004 — ABNORMAL LOW (ref 1.005–1.030)
pH: 8 (ref 5.0–8.0)

## 2020-11-07 LAB — C DIFFICILE QUICK SCREEN W PCR REFLEX
C Diff antigen: NEGATIVE
C Diff interpretation: NOT DETECTED
C Diff toxin: NEGATIVE

## 2020-11-07 LAB — LACTIC ACID, PLASMA: Lactic Acid, Venous: 0.8 mmol/L (ref 0.5–1.9)

## 2020-11-07 MED ORDER — CARVEDILOL 3.125 MG PO TABS
3.1250 mg | ORAL_TABLET | Freq: Two times a day (BID) | ORAL | Status: DC
  Administered 2020-11-07 – 2020-11-09 (×5): 3.125 mg via ORAL
  Filled 2020-11-07 (×5): qty 1

## 2020-11-07 MED ORDER — SODIUM CHLORIDE 0.9 % IV SOLN
1.0000 g | INTRAVENOUS | Status: DC
  Administered 2020-11-07 – 2020-11-09 (×3): 1 g via INTRAVENOUS
  Filled 2020-11-07 (×3): qty 10

## 2020-11-07 MED ORDER — ACETAMINOPHEN 325 MG PO TABS: 650.0000 mg | ORAL_TABLET | Freq: Four times a day (QID) | ORAL | Status: DC | PRN

## 2020-11-07 MED ORDER — POTASSIUM CHLORIDE CRYS ER 20 MEQ PO TBCR
40.0000 meq | EXTENDED_RELEASE_TABLET | Freq: Once | ORAL | Status: AC
  Administered 2020-11-07: 40 meq via ORAL
  Filled 2020-11-07: qty 2

## 2020-11-07 MED ORDER — OXYCODONE HCL 5 MG PO TABS
5.0000 mg | ORAL_TABLET | ORAL | Status: DC | PRN
  Administered 2020-11-07: 5 mg via ORAL
  Filled 2020-11-07: qty 1

## 2020-11-07 MED ORDER — FLEET ENEMA 7-19 GM/118ML RE ENEM
1.0000 | ENEMA | Freq: Once | RECTAL | Status: AC
  Administered 2020-11-07: 1 via RECTAL

## 2020-11-07 MED ORDER — HEPARIN SODIUM (PORCINE) 5000 UNIT/ML IJ SOLN
5000.0000 [IU] | Freq: Three times a day (TID) | INTRAMUSCULAR | Status: DC
  Administered 2020-11-07: 5000 [IU] via SUBCUTANEOUS
  Filled 2020-11-07 (×2): qty 1

## 2020-11-07 MED ORDER — ACETAMINOPHEN 650 MG RE SUPP: 650.0000 mg | Freq: Four times a day (QID) | RECTAL | Status: DC | PRN

## 2020-11-07 MED ORDER — ACETAMINOPHEN 500 MG PO TABS
1000.0000 mg | ORAL_TABLET | Freq: Four times a day (QID) | ORAL | Status: DC
  Administered 2020-11-07 – 2020-11-09 (×10): 1000 mg via ORAL
  Filled 2020-11-07 (×12): qty 2

## 2020-11-07 MED ORDER — ONDANSETRON HCL 4 MG/2ML IJ SOLN: 4.0000 mg | Freq: Four times a day (QID) | INTRAMUSCULAR | Status: DC | PRN

## 2020-11-07 MED ORDER — POTASSIUM CHLORIDE IN NACL 40-0.9 MEQ/L-% IV SOLN
INTRAVENOUS | Status: DC
  Filled 2020-11-07 (×2): qty 1000

## 2020-11-07 MED ORDER — ONDANSETRON HCL 4 MG PO TABS
4.0000 mg | ORAL_TABLET | Freq: Four times a day (QID) | ORAL | Status: DC | PRN
  Administered 2020-11-07: 4 mg via ORAL
  Filled 2020-11-07: qty 1

## 2020-11-07 MED ORDER — ATORVASTATIN CALCIUM 40 MG PO TABS
80.0000 mg | ORAL_TABLET | Freq: Every day | ORAL | Status: DC
  Administered 2020-11-07 – 2020-11-09 (×3): 80 mg via ORAL
  Filled 2020-11-07 (×3): qty 2

## 2020-11-07 MED ORDER — POTASSIUM CHLORIDE IN NACL 40-0.9 MEQ/L-% IV SOLN: INTRAVENOUS | Status: AC

## 2020-11-07 MED ORDER — LEVOTHYROXINE SODIUM 88 MCG PO TABS
88.0000 ug | ORAL_TABLET | Freq: Every day | ORAL | Status: DC
  Administered 2020-11-07 – 2020-11-09 (×3): 88 ug via ORAL
  Filled 2020-11-07 (×3): qty 1

## 2020-11-07 MED ORDER — IOHEXOL 350 MG/ML SOLN
80.0000 mL | Freq: Once | INTRAVENOUS | Status: AC | PRN
  Administered 2020-11-07: 80 mL via INTRAVENOUS

## 2020-11-07 MED ORDER — SODIUM CHLORIDE 0.9 % IV BOLUS
500.0000 mL | Freq: Once | INTRAVENOUS | Status: AC
  Administered 2020-11-07: 500 mL via INTRAVENOUS

## 2020-11-07 MED ORDER — DOXEPIN HCL 10 MG PO CAPS
10.0000 mg | ORAL_CAPSULE | Freq: Every day | ORAL | Status: DC | PRN
  Filled 2020-11-07: qty 1

## 2020-11-07 MED ORDER — LISINOPRIL 5 MG PO TABS
2.5000 mg | ORAL_TABLET | Freq: Every day | ORAL | Status: DC
  Administered 2020-11-07 – 2020-11-09 (×3): 2.5 mg via ORAL
  Filled 2020-11-07 (×3): qty 1

## 2020-11-07 NOTE — Progress Notes (Addendum)
PROGRESS NOTE    Charlene Vincent  HGD:[id number] DOB: [birth year] DOA: 11/06/2020 PCP: Richmond Campbell., PA-C  Brief Narrative: 86 year old female with history of Takotsubo's cardiomyopathy, CAD, hypothyroidism, essential tremor, was seen in the ER on 9/10 after striking her shin on a piece of furniture with resultant wound and bleeding, she was discharged home on oral doxycycline after wound care, presented back to the ER on 9/14 overnight with pain and swelling in her right leg, in the ER noted to have significant erythema, some drainage. -In the ED also had abdominal discomfort had a CT abdomen which noted fecal impaction, subsequently had an episode of urinary retention requiring I/O cath x1    Assessment & Plan:   Right lower extremity cellulitis -Following injury and wound to right leg last week -Failed outpatient Rx with doxycycline -Continue IV ceftriaxone today, has extensive infection in her right lower leg -Elevate right lower extremity -Dopplers were ordered by admitting MD, completed will follow-up results, clinical suspicion for DVT is very low  Urinary retention -Required I/O cath , around midnight-monitor for recurrence Addendum: 2pm: hematuria, likely from foley trauma, hold Hep SQ and monitor  Constipation/fecal impaction -Improving after fleets enema -Add Senokot twice daily  Hypokalemia -Replaced  H/o Takotsubo cardiomyopathy History of CAD -Stable, clinically euvolemic, continue carvedilol and lisinopril  Cognitive deficits/mild memory loss noted  DVT prophylaxis: hep sq Code Status: Full code Family Communication: No family at bedside, attempted to reach patient's son Kyah Buesing without success Disposition Plan:  Status is: Observation  The patient will require care spanning > 2 midnights and should be moved to inpatient because: Inpatient level of care appropriate due to severity of illness  Dispo: The patient is from: Home               Anticipated d/c is to: Home              Patient currently is not medically stable to d/c.   Difficult to place patient No    Procedures:   Antimicrobials:    Subjective: -complains of pain in her right leg, had a BM yesterday and early this morning following fleets enema  Objective: Vitals:   11/07/20 0340 11/07/20 0408 11/07/20 0410 11/07/20 0451  BP: (!) 92/50 (!) 99/55 (!) 111/56 128/71  Pulse: (!) 54 67 60 62  Resp: 18 18 18 18   Temp:    98.7 F (37.1 C)  TempSrc:    Oral  SpO2: 95% 97% 98% 97%  Weight:      Height:        Intake/Output Summary (Last 24 hours) at 11/07/2020 1114 Last data filed at 11/07/2020 0900 Gross per 24 hour  Intake 925.55 ml  Output 1200 ml  Net -274.45 ml   Filed Weights   11/06/20 1816  Weight: 57.6 kg    Examination:  General exam: Elderly pleasant female, sitting up in the recliner, awake alert oriented to self and partly to place only, moderate cognitive deficits noted CVs: S1-S2, regular rate rhythm Lungs: Poor air movement bilaterally Abdomen: Soft, nontender, bowel sounds present EXTR extremities: Right lower leg with extensive erythema warmth and tenderness to touch, open wound with old dried blood and some serosanguineous drainage Psychiatry: Poor insight and judgment    Data Reviewed:   CBC: Recent Labs  Lab 11/06/20 2152 11/07/20 0414  WBC 5.2 9.2  NEUTROABS 3.1  --   HGB 11.6* 11.3*  HCT 36.4 35.4*  MCV 98.1 97.5  PLT 159  154   Basic Metabolic Panel: Recent Labs  Lab 11/06/20 2152 11/07/20 0414  NA 132* 133*  K 3.2* 3.1*  CL 100 101  CO2 27 27  GLUCOSE 101* 103*  BUN 11 9  CREATININE 0.69 0.55  CALCIUM 9.0 8.2*   GFR: Estimated Creatinine Clearance: 47.6 mL/min (by C-G formula based on SCr of 0.55 mg/dL). Liver Function Tests: Recent Labs  Lab 11/07/20 0414  AST 44*  ALT 41  ALKPHOS 74  BILITOT 0.5  PROT 6.4*  ALBUMIN 3.3*   No results for input(s): LIPASE, AMYLASE in the last 168  hours. No results for input(s): AMMONIA in the last 168 hours. Coagulation Profile: No results for input(s): INR, PROTIME in the last 168 hours. Cardiac Enzymes: No results for input(s): CKTOTAL, CKMB, CKMBINDEX, TROPONINI in the last 168 hours. BNP (last 3 results) No results for input(s): PROBNP in the last 8760 hours. HbA1C: No results for input(s): HGBA1C in the last 72 hours. CBG: No results for input(s): GLUCAP in the last 168 hours. Lipid Profile: No results for input(s): CHOL, HDL, LDLCALC, TRIG, CHOLHDL, LDLDIRECT in the last 72 hours. Thyroid Function Tests: No results for input(s): TSH, T4TOTAL, FREET4, T3FREE, THYROIDAB in the last 72 hours. Anemia Panel: No results for input(s): VITAMINB12, FOLATE, FERRITIN, TIBC, IRON, RETICCTPCT in the last 72 hours. Urine analysis:    Component Value Date/Time   COLORURINE YELLOW 11/07/2020 0103   APPEARANCEUR HAZY (A) 11/07/2020 0103   LABSPEC 1.004 (L) 11/07/2020 0103   PHURINE 8.0 11/07/2020 0103   GLUCOSEU NEGATIVE 11/07/2020 0103   HGBUR SMALL (A) 11/07/2020 0103   BILIRUBINUR NEGATIVE 11/07/2020 0103   KETONESUR NEGATIVE 11/07/2020 0103   PROTEINUR NEGATIVE 11/07/2020 0103   UROBILINOGEN 1.0 10/07/2014 1625   NITRITE NEGATIVE 11/07/2020 0103   LEUKOCYTESUR MODERATE (A) 11/07/2020 0103   Sepsis Labs: @LABRCNTIP (procalcitonin:4,lacticidven:4)  ) Recent Results (from the past 240 hour(s))  Culture, blood (routine x 2)     Status: None (Preliminary result)   Collection Time: 11/06/20  9:53 PM   Specimen: Right Antecubital; Blood  Result Value Ref Range Status   Specimen Description RIGHT ANTECUBITAL  Final   Special Requests   Final    Blood Culture adequate volume BOTTLES DRAWN AEROBIC AND ANAEROBIC   Culture   Final    NO GROWTH < 12 HOURS Performed at [facility], [address], [place], [place]    Report Status PENDING  Incomplete  Culture, blood (routine x 2)     Status: None (Preliminary result)    Collection Time: 11/06/20  9:58 PM   Specimen: BLOOD RIGHT FOREARM  Result Value Ref Range Status   Specimen Description BLOOD RIGHT FOREARM  Final   Special Requests   Final    Blood Culture results may not be optimal due to an inadequate volume of blood received in culture bottles BOTTLES DRAWN AEROBIC AND ANAEROBIC   Culture   Final    NO GROWTH < 12 HOURS Performed at [facility], [address], [place], [place]    Report Status PENDING  Incomplete  Resp Panel by RT-PCR (Flu A&B, Covid) Nasopharyngeal Swab     Status: None   Collection Time: 11/06/20 10:02 PM   Specimen: Nasopharyngeal Swab; Nasopharyngeal(NP) swabs in vial transport medium  Result Value Ref Range Status   SARS Coronavirus 2 by RT PCR NEGATIVE NEGATIVE Final    Comment: (NOTE) SARS-CoV-2 target nucleic acids are NOT DETECTED.  The SARS-CoV-2 RNA is generally  detectable in upper respiratory specimens during the acute phase of infection. The lowest concentration of SARS-CoV-2 viral copies this assay can detect is 138 copies/mL. A negative result does not preclude SARS-Cov-2 infection and should not be used as the sole basis for treatment or other patient management decisions. A negative result may occur with  improper specimen collection/handling, submission of specimen other than nasopharyngeal swab, presence of viral mutation(s) within the areas targeted by this assay, and inadequate number of viral copies(<138 copies/mL). A negative result must be combined with clinical observations, patient history, and epidemiological information. The expected result is Negative.  Fact Sheet for Patients:  BloggerCourse.com  Fact Sheet for Healthcare Providers:  SeriousBroker.it  This test is no t yet approved or cleared by the [place] FDA and  has been authorized for detection and/or diagnosis of SARS-CoV-2 by FDA under an Emergency Use Authorization  (EUA). This EUA will remain  in effect (meaning this test can be used) for the duration of the COVID-19 declaration under Section 564(b)(1) of the Act, 21 U.S.C.section 360bbb-3(b)(1), unless the authorization is terminated  or revoked sooner.       Influenza A by PCR NEGATIVE NEGATIVE Final   Influenza B by PCR NEGATIVE NEGATIVE Final    Comment: (NOTE) The Xpert Xpress SARS-CoV-2/FLU/RSV plus assay is intended as an aid in the diagnosis of influenza from Nasopharyngeal swab specimens and should not be used as a sole basis for treatment. Nasal washings and aspirates are unacceptable for Xpert Xpress SARS-CoV-2/FLU/RSV testing.  Fact Sheet for Patients: BloggerCourse.com  Fact Sheet for Healthcare Providers: SeriousBroker.it  This test is not yet approved or cleared by the [place] FDA and has been authorized for detection and/or diagnosis of SARS-CoV-2 by FDA under an Emergency Use Authorization (EUA). This EUA will remain in effect (meaning this test can be used) for the duration of the COVID-19 declaration under Section 564(b)(1) of the Act, 21 U.S.C. section 360bbb-3(b)(1), unless the authorization is terminated or revoked.  Performed at [facility], [address] [place], [place] [address]   C Difficile Quick Screen w PCR reflex     Status: None   Collection Time: 11/07/20  1:03 AM   Specimen: STOOL  Result Value Ref Range Status   C Diff antigen NEGATIVE NEGATIVE Final   C Diff toxin NEGATIVE NEGATIVE Final   C Diff interpretation No C. difficile detected.  Final    Comment: Performed at [facility], [address], [place], [place] [address]         Radiology Studies: DG Tibia/Fibula Right  Result Date: 11/06/2020 CLINICAL DATA:  Right tib fib wound/infection EXAM: RIGHT TIBIA AND FIBULA - 2 VIEW COMPARISON:  None. FINDINGS: No fracture or dislocation is seen. The joint spaces are preserved. Mild soft  tissue ulceration along the anterior aspect of the mid tibia on the lateral view. No radiopaque foreign body is seen. IMPRESSION: Mild soft tissue ulceration along the anterior aspect of the mid tibia on the lateral view. No fracture, dislocation, or radiopaque foreign body is seen. Electronically Signed   By: Charline Bills M.D.   On: 11/06/2020 23:04   CT ABDOMEN PELVIS W CONTRAST  Result Date: 11/07/2020 CLINICAL DATA:  Abdominal pain, diarrhea EXAM: CT ABDOMEN AND PELVIS WITH CONTRAST TECHNIQUE: Multidetector CT imaging of the abdomen and pelvis was performed using the standard protocol following bolus administration of intravenous contrast. CONTRAST:  67mL OMNIPAQUE IOHEXOL 350 MG/ML SOLN COMPARISON:  11/14/2017 FINDINGS: Lower chest: Lung bases are  clear. Hepatobiliary: Liver is within normal limits. Gallbladder is unremarkable. No intrahepatic or extrahepatic ductal dilatation. Pancreas: Within normal limits. Spleen: Within normal limits. Adrenals/Urinary Tract: Adrenal glands are within normal limits. Left kidney is within normal limits. Right kidney is notable for an extrarenal pelvis with moderate hydroureteronephrosis. Distended bladder. Stomach/Bowel: Stomach is notable for a large hiatal hernia/inverted intrathoracic stomach. No evidence of bowel obstruction. Normal appendix (series 2/image 32). Large rectal stool burden (series 2/image 63). Vascular/Lymphatic: No evidence of abdominal aortic aneurysm. Atherosclerotic calcifications of the abdominal aorta and branch vessels. No suspicious abdominopelvic lymphadenopathy. Reproductive: Status post hysterectomy. No adnexal masses. Other: No abdominopelvic ascites. Musculoskeletal: Degenerative changes of the lumbar spine with mild anterior wedging at L2, chronic. Lumbar levoscoliosis. Left parasymphyseal fracture deformity, chronic. IMPRESSION: Large rectal stool burden/fecal impaction. No evidence of bowel obstruction.  Normal appendix. Large  hiatal hernia/inverted intrathoracic stomach. Additional ancillary findings as above. Electronically Signed   By: Charline Bills M.D.   On: 11/07/2020 02:32        Scheduled Meds:  acetaminophen  1,000 mg Oral Q6H   atorvastatin  80 mg Oral Daily   carvedilol  3.125 mg Oral BID WC   heparin  5,000 Units Subcutaneous Q8H   levothyroxine  88 mcg Oral Daily   lisinopril  2.5 mg Oral Daily   Continuous Infusions:  0.9 % NaCl with KCl 40 mEq / L 75 mL/hr at 11/07/20 0641   cefTRIAXone (ROCEPHIN)  IV 1 g (11/07/20 0709)     LOS: 0 days    Time spent:  Zannie Cove, MD [facility]   11/07/2020, 11:14 AM

## 2020-11-07 NOTE — H&P (Signed)
TRH H&P    Patient Demographics:    Charlene Vincent, is a 86 y.o. female  MRN: [id number]  DOB - 04-10-1934  Admit Date - 11/06/2020  Referring MD/NP/PA: Audley Hose  Outpatient Primary MD for the patient is Richmond Campbell., PA-C  Patient coming from: Home  Chief complaint- Cellulitis   HPI:    Charlene Vincent  is a 86 y.o. female, with history of hypothyroidism, coronary artery disease, Takotsubo cardiomyopathy, and essential tremor presents the ED with a chief complaint of cellulitis.  Patient was seen approximately 5 days ago in the ER after striking her shin on a piece of furniture in the days previous to that visit.  She had no active bleeding at that time but did have serosanguineous discharge from a triangular-shaped wound.  She had some spreading warmth and redness, but x-rays did not reveal any subcutaneous emphysema, or osseous abnormalities.  She was started on doxycycline.  She returned to the ER today for right leg pain -as reported by son who was initially here, but left prior to my exam.  Patient is a very poor historian, reporting she does not know why she is here.  History taken from chart.  In the ED she was started on Ancef for moderate disease nonpurulent cellulitis.  At the time of my exam patient is able to say that her right leg hurts her.  It is worse with palpation.Her pain pill helps it, but she feels like the swelling and redness have increased in her leg.  Patient complains of diarrhea, but does not know how many times she has had diarrhea, and is not able to provide further details.  Patient is oriented to self, but not to place or time.  No further history could be obtained at this time.  The ED Temp 98.2, heart rate 60-63, blood pressure 152/74, satting 100% No leukocytosis with a white blood cell count of 5.2, hemoglobin 11.6 Chemistry panel shows a hypokalemia 3.2 Lactic acid 1.7 Blood  cultures pending Ancef, Tylenol given Request for admission for cellulitis that failed outpatient treatment     Review of systems:    Patient is not able to provide review of systems secondary to dementia    Past History of the following :    Past Medical History:  Diagnosis Date   Anemia    Arthritis    Cellulitis    Costochondritis    Headache    Hypothyroidism    Myocardial infarction (HCC) 2012, 2016   both Takotsubo   Pneumonia    Shingles    Takotsubo cardiomyopathy    a. 2012  b. LHC in 09/2014 nl cors, EF 30-35%>>45-50% by echo 11/2014   Tremor, essential       Past Surgical History:  Procedure Laterality Date   ABDOMINAL HYSTERECTOMY     APPLICATION OF A-CELL OF EXTREMITY Left 01/22/2015   Procedure: APPLICATION OF A-CELL TO LEFT THIGH ;  Surgeon: Glenna Fellows, MD;  Location: [facility] OR;  Service: Plastics;  Laterality: Left;   APPLICATION OF WOUND VAC Left  10/09/2014   Procedure: APPLICATION OF WOUND VAC;  Surgeon: Cammy Copa, MD;  Location: [facility] OR;  Service: Orthopedics;  Laterality: Left;   CARDIAC CATHETERIZATION  September 10, 2010   EF 35 to 40%. No significant CAD. ? takotsubo cardiomyopathy   CARDIAC CATHETERIZATION N/A 10/02/2014   Procedure: Left Heart Cath and Coronary Angiography;  Surgeon: Peter M Swaziland, MD;  Location: [facility] INVASIVE CV LAB;  Service: Cardiovascular;  Laterality: N/A;   I & D EXTREMITY Left 10/09/2014   Procedure: DEBRIDEMENT LEFT FOOT;  Surgeon: Cammy Copa, MD;  Location: [facility] OR;  Service: Orthopedics;  Laterality: Left;   INGUINAL HERNIA REPAIR     SKIN FULL THICKNESS GRAFT Left 10/12/2014   Procedure: Debridement left foot, application integra, wound vacuum-assisted closure;  Surgeon: Glenna Fellows, MD;  Location: [facility] OR;  Service: Plastics;  Laterality: Left;   SKIN GRAFT Left 01/22/2015   left thigh to left foot   SKIN SPLIT GRAFT Left 01/22/2015   Procedure:  SPLIT THICKNESS SKIN GRAFT FROM LEFT THIGH TO LEFT FOOT ;   Surgeon: Glenna Fellows, MD;  Location: [facility] OR;  Service: Plastics;  Laterality: Left;      Social History:      Social History   Tobacco Use   Smoking status: Former    Packs/day: 0.50    Types: Cigarettes    Quit date: 09/07/2010    Years since quitting: 10.1   Smokeless tobacco: Never  Substance Use Topics   Alcohol use: No       Family History :     Family History  Problem Relation Age of Onset   Heart disease Father    Heart disease Brother       Home Medications:   Prior to Admission medications   Medication Sig Start Date End Date Taking? Authorizing Provider  acetaminophen (TYLENOL) 500 MG tablet Take 1,000 mg by mouth every 6 (six) hours.    [provider]  atorvastatin (LIPITOR) 80 MG tablet Take 80 mg by mouth daily.    [provider]  carvedilol (COREG) 3.125 MG tablet Take 1 tablet (3.125 mg total) by mouth 2 (two) times daily with a meal. 03/25/20   [place], Peter M, MD  doxepin (SINEQUAN) 10 MG capsule Take 10 mg by mouth daily as needed (itching).    [provider]  doxycycline (VIBRAMYCIN) 100 MG capsule Take 1 capsule (100 mg total) by mouth 2 (two) times daily. 11/02/20   Prosperi, Christian H, PA-C  fluticasone (FLONASE) 50 MCG/ACT nasal spray Place 1-2 sprays into both nostrils daily. 09/02/16   [provider]  levothyroxine (SYNTHROID, LEVOTHROID) 88 MCG tablet Take 88 mcg by mouth daily. 04/08/16   [provider]  lisinopril (ZESTRIL) 2.5 MG tablet Take 1 tablet (2.5 mg total) by mouth daily. 03/25/20   [place], Peter M, MD     Allergies:     Allergies  Allergen Reactions   Aspirin     But taking low dose therapy without problems.   Nystatin Itching    She said when she used the cream it made symptoms worse    Penicillins Rash     Physical Exam:   Vitals  Blood pressure (!) 142/74, pulse 62, temperature 98.2 F (36.8 C), temperature source Oral, resp. rate 18, height 5\' 7"  (1.702 m),  weight 57.6 kg, SpO2 100 %.  1.  General: Patient lying supine in bed,  no acute distress, tremor and neck   2. Psychiatric: Alert  and oriented x 1, mood and behavior normal for situation, cooperative with exam   3. Neurologic: Speech and language are normal, face is symmetric, moves all 4 extremities voluntarily, at baseline without acute deficits on limited exam   4. HEENMT:  Head is atraumatic, normocephalic, pupils reactive to light, neck is cachectic, trachea is midline, mucous membranes are moist   5. Respiratory : Lungs are clear to auscultation bilaterally without wheezing, rhonchi, rales, no cyanosis, no increase in work of breathing or accessory muscle use   6. Cardiovascular : Heart rate normal, rhythm is regular, no murmurs, rubs or gallops,  peripheral pulses palpated   7. Gastrointestinal:  Abdomen is soft, nondistended, tender in the right lower quadrant with involuntary guarding, bowel sounds active, no masses or organomegaly palpated   8. Skin:  Right lower extremity is significantly erythematous, and edematous with 3 cm healing wound, no purulent drainage, no bleeding, tender to palpation   9.Musculoskeletal:  No acute deformities or trauma, no asymmetry in tone, no peripheral edema, peripheral pulses palpated,    Data Review:    CBC Recent Labs  Lab 11/06/20 2152  WBC 5.2  HGB 11.6*  HCT 36.4  PLT 159  MCV 98.1  MCH 31.3  MCHC 31.9  RDW 13.6  LYMPHSABS 1.2  MONOABS 0.8  EOSABS 0.1  BASOSABS 0.0   ------------------------------------------------------------------------------------------------------------------  Results for orders placed or performed during the hospital encounter of 11/06/20 (from the past 48 hour(s))  CBC with Differential     Status: Abnormal   Collection Time: 11/06/20  9:52 PM  Result Value Ref Range   WBC 5.2 4.0 - 10.5 K/uL   RBC 3.71 (L) 3.87 - 5.11 MIL/uL   Hemoglobin 11.6 (L) 12.0 - 15.0 g/dL   HCT 78.6 75.4 - 49.2 %    MCV 98.1 80.0 - 100.0 fL   MCH 31.3 26.0 - 34.0 pg   MCHC 31.9 30.0 - 36.0 g/dL   RDW 01.0 07.1 - 21.9 %   Platelets 159 150 - 400 K/uL   nRBC 0.0 0.0 - 0.2 %   Neutrophils Relative % 58 %   Neutro Abs 3.1 1.7 - 7.7 K/uL   Lymphocytes Relative 23 %   Lymphs Abs 1.2 0.7 - 4.0 K/uL   Monocytes Relative 16 %   Monocytes Absolute 0.8 0.1 - 1.0 K/uL   Eosinophils Relative 2 %   Eosinophils Absolute 0.1 0.0 - 0.5 K/uL   Basophils Relative 1 %   Basophils Absolute 0.0 0.0 - 0.1 K/uL   Immature Granulocytes 0 %   Abs Immature Granulocytes 0.00 0.00 - 0.07 K/uL    Comment: Performed at [facility], [address], [place], [place] [address]  Basic metabolic panel     Status: Abnormal   Collection Time: 11/06/20  9:52 PM  Result Value Ref Range   Sodium 132 (L) 135 - 145 mmol/L   Potassium 3.2 (L) 3.5 - 5.1 mmol/L   Chloride 100 98 - 111 mmol/L   CO2 27 22 - 32 mmol/L   Glucose, Bld 101 (H) 70 - 99 mg/dL    Comment: Glucose reference range applies only to samples taken after fasting for at least 8 hours.   BUN 11 8 - 23 mg/dL   Creatinine, Ser 2.54 0.44 - 1.00 mg/dL   Calcium 9.0 8.9 - 98.2 mg/dL   GFR, Estimated >64 >15 mL/min    Comment: (NOTE) Calculated using the CKD-EPI Creatinine Equation (2021)    Anion gap 5  5 - 15    Comment: Performed at [facility], [address], [place], [place] [address]  Lactic acid, plasma     Status: None   Collection Time: 11/06/20  9:52 PM  Result Value Ref Range   Lactic Acid, Venous 1.7 0.5 - 1.9 mmol/L    Comment: Performed at [facility], [address] [place], [place] [address]  Culture, blood (routine x 2)     Status: None (Preliminary result)   Collection Time: 11/06/20  9:53 PM   Specimen: Right Antecubital; Blood  Result Value Ref Range   Specimen Description RIGHT ANTECUBITAL    Special Requests      Blood Culture adequate volume BOTTLES DRAWN AEROBIC AND ANAEROBIC Performed at [facility], [address] [place], [place]  [address]    Culture PENDING    Report Status PENDING   Culture, blood (routine x 2)     Status: None (Preliminary result)   Collection Time: 11/06/20  9:58 PM   Specimen: BLOOD RIGHT FOREARM  Result Value Ref Range   Specimen Description BLOOD RIGHT FOREARM    Special Requests      Blood Culture results may not be optimal due to an inadequate volume of blood received in culture bottles BOTTLES DRAWN AEROBIC AND ANAEROBIC Performed at [facility], [address], [place], [place] [address]    Culture PENDING    Report Status PENDING   Resp Panel by RT-PCR (Flu A&B, Covid) Nasopharyngeal Swab     Status: None   Collection Time: 11/06/20 10:02 PM   Specimen: Nasopharyngeal Swab; Nasopharyngeal(NP) swabs in vial transport medium  Result Value Ref Range   SARS Coronavirus 2 by RT PCR NEGATIVE NEGATIVE    Comment: (NOTE) SARS-CoV-2 target nucleic acids are NOT DETECTED.  The SARS-CoV-2 RNA is generally detectable in upper respiratory specimens during the acute phase of infection. The lowest concentration of SARS-CoV-2 viral copies this assay can detect is 138 copies/mL. A negative result does not preclude SARS-Cov-2 infection and should not be used as the sole basis for treatment or other patient management decisions. A negative result may occur with  improper specimen collection/handling, submission of specimen other than nasopharyngeal swab, presence of viral mutation(s) within the areas targeted by this assay, and inadequate number of viral copies(<138 copies/mL). A negative result must be combined with clinical observations, patient history, and epidemiological information. The expected result is Negative.  Fact Sheet for Patients:  BloggerCourse.com  Fact Sheet for Healthcare Providers:  SeriousBroker.it  This test is no t yet approved or cleared by the [place] FDA and  has been authorized for detection and/or diagnosis of  SARS-CoV-2 by FDA under an Emergency Use Authorization (EUA). This EUA will remain  in effect (meaning this test can be used) for the duration of the COVID-19 declaration under Section 564(b)(1) of the Act, 21 U.S.C.section 360bbb-3(b)(1), unless the authorization is terminated  or revoked sooner.       Influenza A by PCR NEGATIVE NEGATIVE   Influenza B by PCR NEGATIVE NEGATIVE    Comment: (NOTE) The Xpert Xpress SARS-CoV-2/FLU/RSV plus assay is intended as an aid in the diagnosis of influenza from Nasopharyngeal swab specimens and should not be used as a sole basis for treatment. Nasal washings and aspirates are unacceptable for Xpert Xpress SARS-CoV-2/FLU/RSV testing.  Fact Sheet for Patients: BloggerCourse.com  Fact Sheet for Healthcare Providers: SeriousBroker.it  This test is not yet approved or cleared by the [place] and has been authorized  for detection and/or diagnosis of SARS-CoV-2 by FDA under an Emergency Use Authorization (EUA). This EUA will remain in effect (meaning this test can be used) for the duration of the COVID-19 declaration under Section 564(b)(1) of the Act, 21 U.S.C. section 360bbb-3(b)(1), unless the authorization is terminated or revoked.  Performed at [facility], [address], [place], [place] [address]   Lactic acid, plasma     Status: None   Collection Time: 11/07/20 12:26 AM  Result Value Ref Range   Lactic Acid, Venous 0.8 0.5 - 1.9 mmol/L    Comment: Performed at [facility], [address], [place], [place] [address]    Chemistries  Recent Labs  Lab 11/06/20 2152  NA 132*  K 3.2*  CL 100  CO2 27  GLUCOSE 101*  BUN 11  CREATININE 0.69  CALCIUM 9.0    ------------------------------------------------------------------------------------------------------------------  ------------------------------------------------------------------------------------------------------------------ GFR: Estimated Creatinine Clearance: 47.6 mL/min (by C-G formula based on SCr of 0.69 mg/dL). Liver Function Tests: No results for input(s): AST, ALT, ALKPHOS, BILITOT, PROT, ALBUMIN in the last 168 hours. No results for input(s): LIPASE, AMYLASE in the last 168 hours. No results for input(s): AMMONIA in the last 168 hours. Coagulation Profile: No results for input(s): INR, PROTIME in the last 168 hours. Cardiac Enzymes: No results for input(s): CKTOTAL, CKMB, CKMBINDEX, TROPONINI in the last 168 hours. BNP (last 3 results) No results for input(s): PROBNP in the last 8760 hours. HbA1C: No results for input(s): HGBA1C in the last 72 hours. CBG: No results for input(s): GLUCAP in the last 168 hours. Lipid Profile: No results for input(s): CHOL, HDL, LDLCALC, TRIG, CHOLHDL, LDLDIRECT in the last 72 hours. Thyroid Function Tests: No results for input(s): TSH, T4TOTAL, FREET4, T3FREE, THYROIDAB in the last 72 hours. Anemia Panel: No results for input(s): VITAMINB12, FOLATE, FERRITIN, TIBC, IRON, RETICCTPCT in the last 72 hours.  --------------------------------------------------------------------------------------------------------------- Urine analysis:    Component Value Date/Time   COLORURINE STRAW (A) 05/14/2019 1735   APPEARANCEUR CLEAR 05/14/2019 1735   LABSPEC 1.004 (L) 05/14/2019 1735   PHURINE 7.0 05/14/2019 1735   GLUCOSEU NEGATIVE 05/14/2019 1735   HGBUR NEGATIVE 05/14/2019 1735   BILIRUBINUR NEGATIVE 05/14/2019 1735   KETONESUR NEGATIVE 05/14/2019 1735   PROTEINUR NEGATIVE 05/14/2019 1735   UROBILINOGEN 1.0 10/07/2014 1625   NITRITE NEGATIVE 05/14/2019 1735   LEUKOCYTESUR TRACE (A) 05/14/2019 1735      Imaging Results:    DG  Tibia/Fibula Right  Result Date: 11/06/2020 CLINICAL DATA:  Right tib fib wound/infection EXAM: RIGHT TIBIA AND FIBULA - 2 VIEW COMPARISON:  None. FINDINGS: No fracture or dislocation is seen. The joint spaces are preserved. Mild soft tissue ulceration along the anterior aspect of the mid tibia on the lateral view. No radiopaque foreign body is seen. IMPRESSION: Mild soft tissue ulceration along the anterior aspect of the mid tibia on the lateral view. No fracture, dislocation, or radiopaque foreign body is seen. Electronically Signed   By: Charline Bills M.D.   On: 11/06/2020 23:04      Assessment & Plan:    Active Problems:   Hypothyroidism   Cellulitis   Hypokalemia   Abdominal pain   Cellulitis Physical exam consistent with cellulitis, but DVT is also on the differential [place] Lower extremity in the AM Pt failed outpatient therapy with doxy Continue Rocephin per moderate disease, non purulent cellulitis, orderset No signs of sepsis at this time Continue to monitor Abdominal pain Patient doesn't know when it started Most tender in RLQ 3  episodes of diarrhea in the ED CT abd/pelvis ordered C.diff testing ordered. Hypokalemia 2/2 GI losses Replace, recheck Mag added on to initial labs Continue to monitor Hypocalcemia Corrects to 8.4 for albumin 1 g calcium gluc Recheck in the AM Severe protein cal mal Albumin 2.0 Add protein shake between meals Hypothyroidism Continue synthroid CAD Continue statin, ACE, and Beta blocker   DVT Prophylaxis-   heparin - SCDs   AM Labs Ordered, also please review Full Orders  Family Communication: No family at bedside. Did not attempt to reach family at this late hour  Code Status:  Full code until confirmation with family can be done  Admission status: Observation Time spent in minutes : 65   Oda Placke B Zierle-Ghosh DO

## 2020-11-07 NOTE — ED Notes (Signed)
Pt has had three large loose stools in the last 30 minutes. Hospitalist made aware and to give orders.

## 2020-11-07 NOTE — Plan of Care (Signed)

## 2020-11-07 NOTE — Progress Notes (Signed)
Pt made this nurse aware of blood in her urine, MD aware, will continue to monitor.

## 2020-11-07 NOTE — Progress Notes (Signed)
Bladder scan showed more than . In and out returned of urine. This could be the source of her lower quadrant tenderness. CT abd/pelvis shows a stool impaction. Patient's diarrhea is likely due only fluid making it past the impaction. Will order fleet enema.

## 2020-11-08 DIAGNOSIS — L03115 Cellulitis of right lower limb: Secondary | ICD-10-CM | POA: Diagnosis not present

## 2020-11-08 LAB — BASIC METABOLIC PANEL
Anion gap: 3 — ABNORMAL LOW (ref 5–15)
BUN: 9 mg/dL (ref 8–23)
CO2: 24 mmol/L (ref 22–32)
Calcium: 7.8 mg/dL — ABNORMAL LOW (ref 8.9–10.3)
Chloride: 107 mmol/L (ref 98–111)
Creatinine, Ser: 0.6 mg/dL (ref 0.44–1.00)
GFR, Estimated: >60 mL/min (ref >60)
Glucose, Bld: 100 mg/dL — ABNORMAL HIGH (ref 70–99)
Potassium: 4.5 mmol/L (ref 3.5–5.1)
Sodium: 134 mmol/L — ABNORMAL LOW (ref 135–145)

## 2020-11-08 LAB — CBC
HCT: 31.2 % — ABNORMAL LOW (ref 36.0–46.0)
Hemoglobin: 9.9 g/dL — ABNORMAL LOW (ref 12.0–15.0)
MCH: 31.4 pg (ref 26.0–34.0)
MCHC: 31.7 g/dL (ref 30.0–36.0)
MCV: 99 fL (ref 80.0–100.0)
Platelets: 145 10*3/uL — ABNORMAL LOW (ref 150–400)
RBC: 3.15 MIL/uL — ABNORMAL LOW (ref 3.87–5.11)
RDW: 13.7 % (ref 11.5–15.5)
WBC: 4 10*3/uL (ref 4.0–10.5)
nRBC: 0 % (ref 0.0–0.2)

## 2020-11-08 LAB — URINE CULTURE

## 2020-11-08 LAB — GLUCOSE, CAPILLARY: Glucose-Capillary: 87 mg/dL (ref 70–99)

## 2020-11-08 MED ORDER — FLUTICASONE PROPIONATE 50 MCG/ACT NA SUSP
2.0000 | Freq: Every day | NASAL | Status: DC
  Administered 2020-11-08 – 2020-11-09 (×2): 2 via NASAL
  Filled 2020-11-08: qty 16

## 2020-11-08 NOTE — Plan of Care (Signed)
  Problem: Coping: Goal: Level of anxiety will decrease Outcome: Progressing   Problem: Safety: Goal: Ability to remain free from injury will improve Outcome: Progressing   Problem: Skin Integrity: Goal: Risk for impaired skin integrity will decrease Outcome: Progressing   

## 2020-11-08 NOTE — Care Management Important Message (Signed)
Important Message  Patient Details  Name: Charlene Vincent MRN: [id number] Date of Birth: 26-Jan-1935   Medicare Important Message Given:  Yes     Corey Harold 11/08/2020, 12:10 PM

## 2020-11-08 NOTE — Evaluation (Signed)
Physical Therapy Evaluation Patient Details Name: Charlene Vincent MRN: [id number] DOB: [birth year] Today's Date: 11/08/2020  History of Present Illness  Charlene Vincent  is a 86 y.o. female, with history of hypothyroidism, coronary artery disease, Takotsubo cardiomyopathy, and essential tremor presents the ED with a chief complaint of cellulitis.  Patient was seen approximately 5 days ago in the ER after striking her shin on a piece of furniture in the days previous to that visit.  She had no active bleeding at that time but did have serosanguineous discharge from a triangular-shaped wound.  She had some spreading warmth and redness, but x-rays did not reveal any subcutaneous emphysema, or osseous abnormalities.  She was started on doxycycline.  She returned to the ER today for right leg pain -as reported by son who was initially here, but left prior to my exam.  Patient is a very poor historian, reporting she does not know why she is here.  History taken from chart.  In the ED she was started on Ancef for moderate disease nonpurulent cellulitis.  At the time of my exam patient is able to say that her right leg hurts her.  It is worse with palpation.Her pain pill helps it, but she feels like the swelling and redness have increased in her leg.  Patient complains of diarrhea, but does not know how many times she has had diarrhea, and is not able to provide further details.  Patient is oriented to self, but not to place or time.  No further history could be obtained at this time.   Clinical Impression  Patient functioning at baseline for functional mobility and gait demonstrating good return for walking short distances in room without AD and longer distances in hallway using RW without loss of balance.  Patient tolerated sitting up in chair after therapy - nursing staff aware.  Plan:  Patient discharged from physical therapy to care of nursing for ambulation daily as tolerated for length of stay.          Recommendations for follow up therapy are one component of a multi-disciplinary discharge planning process, led by the attending physician.  Recommendations may be updated based on patient status, additional functional criteria and insurance authorization.  Follow Up Recommendations No PT follow up    Equipment Recommendations  None recommended by PT    Recommendations for Other Services       Precautions / Restrictions Precautions Precautions: None Restrictions Weight Bearing Restrictions: No      Mobility  Bed Mobility Overal bed mobility: Modified Independent                  Transfers Overall transfer level: Modified independent                  Ambulation/Gait Ambulation/Gait assistance: Modified independent (Device/Increase time) Gait Distance (Feet): 200 Feet Assistive device: Rolling walker (2 wheeled) Gait Pattern/deviations: WFL(Within Functional Limits) Gait velocity: slightly decreased   General Gait Details: slightly unsteady slow cadence when walking without AD, increased endurance/distance using RW without loss of balance  Stairs            Wheelchair Mobility    Modified Rankin (Stroke Patients Only)       Balance Overall balance assessment: Needs assistance Sitting-balance support: Feet supported;No upper extremity supported Sitting balance-Leahy Scale: Good Sitting balance - Comments: seated at EOB   Standing balance support: During functional activity;No upper extremity supported Standing balance-Leahy Scale: Fair Standing balance comment: fair/good using RW  Pertinent Vitals/Pain Pain Assessment: No/denies pain    Home Living Family/patient expects to be discharged to:: Private residence Living Arrangements: Spouse/significant other;Children Available Help at Discharge: Family;Available 24 hours/day Type of Home: House Home Access: Stairs to enter Entrance Stairs-Rails:  Doctor, general practice of Steps: 5 Home Layout: One level Home Equipment: Walker - 2 wheels;Cane - single point;Wheelchair - manual      Prior Function Level of Independence: Independent with assistive device(s)         Comments: household and short distanced community ambulator using RW PRN     Hand Dominance        Extremity/Trunk Assessment   Upper Extremity Assessment Upper Extremity Assessment: Overall WFL for tasks assessed    Lower Extremity Assessment Lower Extremity Assessment: Overall WFL for tasks assessed    Cervical / Trunk Assessment Cervical / Trunk Assessment: Normal  Communication   Communication: HOH  Cognition Arousal/Alertness: Awake/alert Behavior During Therapy: WFL for tasks assessed/performed Overall Cognitive Status: Within Functional Limits for tasks assessed                                        General Comments      Exercises     Assessment/Plan    PT Assessment Patent does not need any further PT services  PT Problem List         PT Treatment Interventions      PT Goals (Current goals can be found in the Care Plan section)  Acute Rehab PT Goals Patient Stated Goal: return home with family to assist PT Goal Formulation: With patient Time For Goal Achievement: 11/08/20 Potential to Achieve Goals: Good    Frequency     Barriers to discharge        Co-evaluation               AM-PAC PT "6 Clicks" Mobility  Outcome Measure Help needed turning from your back to your side while in a flat bed without using bedrails?: None Help needed moving from lying on your back to sitting on the side of a flat bed without using bedrails?: None Help needed moving to and from a bed to a chair (including a wheelchair)?: None Help needed standing up from a chair using your arms (e.g., wheelchair or bedside chair)?: None Help needed to walk in hospital room?: None Help needed climbing 3-5 steps with a  railing? : None 6 Click Score: 24    End of Session   Activity Tolerance: Patient tolerated treatment well Patient left: in chair;with call bell/phone within reach Nurse Communication: Mobility status PT Visit Diagnosis: Unsteadiness on feet (R26.81);Other abnormalities of gait and mobility (R26.89);Muscle weakness (generalized) (M62.81)    Time: 1450-1511 PT Time Calculation (min) (ACUTE ONLY): 21 min   Charges:   PT Evaluation $PT Eval Moderate Complexity: 1 Mod PT Treatments $Therapeutic Activity: 8-22 mins        3:33 PM, 11/08/20 Ocie Bob, MPT Physical Therapist with [facility] [phone] office [phone] mobile phone

## 2020-11-08 NOTE — Progress Notes (Addendum)
PROGRESS NOTE    Charlene Vincent  NKN:[id number] DOB: 1934/10/09 DOA: 11/06/2020 PCP: Richmond Campbell., PA-C  Brief Narrative: 86 year old female with history of Takotsubo's cardiomyopathy, CAD, hypothyroidism, essential tremor, was seen in the ER on 9/10 after striking her shin on a piece of furniture with resultant wound and bleeding, she was discharged home on oral doxycycline after wound care, presented back to the ER on 9/14 overnight with pain and swelling in her right leg, in the ER noted to have significant erythema, some drainage. -In the ED also had abdominal discomfort had a CT abdomen which noted fecal impaction, subsequently had an episode of urinary retention requiring I/O cath x1  Assessment & Plan:   Right lower extremity cellulitis -Following injury and wound to right leg last week -Failed outpatient Rx with doxycycline -Continue IV ceftriaxone today, starting to improve slowly -Dopplers were ordered by admitting MD, negative for DVT -Ambulate, PT OT eval  Urinary retention Followed by hematuria -Required I/O cath , around midnight-9/14 -15 -With intermittent hematuria, likely from Foley trauma, hold heparin subcu and monitor  -Urine culture with multiple species, no symptoms of UTI -If this persists will obtain CT abdomen pelvis  Constipation/fecal impaction -Improving after fleets enema -Added Senokot  Hypokalemia -Replaced  H/o Takotsubo cardiomyopathy History of CAD -Stable, clinically euvolemic, continue carvedilol and lisinopril  Cognitive deficits/mild memory loss noted  DVT prophylaxis: hep sq Code Status: Full code Family Communication: No family at bedside, updated son Annabelle Harman Disposition Plan:  Status is:  Inpatient level of care appropriate due to severity of illness  Dispo: The patient is from: Home              Anticipated d/c is to: Home              Patient currently is not medically stable to d/c.   Difficult to place patient  No  Procedures:   Antimicrobials:    Subjective: -Feels okay overall, had multiple episodes of hematuria yesterday, denies any burning or pain Objective: Vitals:   11/07/20 1345 11/07/20 2134 11/08/20 0516 11/08/20 1246  BP: 126/62 113/63 (!) 126/56 115/69  Pulse: 65 64 61 72  Resp: 18 17 19 17   Temp: 97.6 F (36.4 C) 97.9 F (36.6 C) (!) 97.5 F (36.4 C) 97.8 F (36.6 C)  TempSrc: Oral  Oral   SpO2:  98% 100%   Weight:      Height:        Intake/Output Summary (Last 24 hours) at 11/08/2020 1354 Last data filed at 11/07/2020 1500 Gross per 24 hour  Intake 308.95 ml  Output --  Net 308.95 ml   Filed Weights   11/06/20 1816  Weight: 57.6 kg    Examination:  General exam: Elderly pleasant female sitting up in recliner, awake alert oriented to self and partly place, moderate cognitive deficits noted CVS: S1-S2, regular rate rhythm Lungs: Poor air movement bilaterally Abdomen: Soft, nontender, bowel sounds present Extremities: Right lower leg with erythema warmth and tenderness, open wound with old dried blood and serosanguineous drainage  Psychiatry: Poor insight and judgment    Data Reviewed:   CBC: Recent Labs  Lab 11/06/20 2152 11/07/20 0414 11/08/20 0549  WBC 5.2 9.2 4.0  NEUTROABS 3.1  --   --   HGB 11.6* 11.3* 9.9*  HCT 36.4 35.4* 31.2*  MCV 98.1 97.5 99.0  PLT 159 154 145*   Basic Metabolic Panel: Recent Labs  Lab 11/06/20 2152 11/07/20 0414 11/08/20 0549  NA  132* 133* 134*  K 3.2* 3.1* 4.5  CL 100 101 107  CO2 27 27 24   GLUCOSE 101* 103* 100*  BUN 11 9 9   CREATININE 0.69 0.55 0.60  CALCIUM 9.0 8.2* 7.8*   GFR: Estimated Creatinine Clearance: 47.6 mL/min (by C-G formula based on SCr of 0.6 mg/dL). Liver Function Tests: Recent Labs  Lab 11/07/20 0414  AST 44*  ALT 41  ALKPHOS 74  BILITOT 0.5  PROT 6.4*  ALBUMIN 3.3*   No results for input(s): LIPASE, AMYLASE in the last 168 hours. No results for input(s): AMMONIA in the last  168 hours. Coagulation Profile: No results for input(s): INR, PROTIME in the last 168 hours. Cardiac Enzymes: No results for input(s): CKTOTAL, CKMB, CKMBINDEX, TROPONINI in the last 168 hours. BNP (last 3 results) No results for input(s): PROBNP in the last 8760 hours. HbA1C: No results for input(s): HGBA1C in the last 72 hours. CBG: Recent Labs  Lab 11/08/20 0738  GLUCAP 87   Lipid Profile: No results for input(s): CHOL, HDL, LDLCALC, TRIG, CHOLHDL, LDLDIRECT in the last 72 hours. Thyroid Function Tests: No results for input(s): TSH, T4TOTAL, FREET4, T3FREE, THYROIDAB in the last 72 hours. Anemia Panel: No results for input(s): VITAMINB12, FOLATE, FERRITIN, TIBC, IRON, RETICCTPCT in the last 72 hours. Urine analysis:    Component Value Date/Time   COLORURINE YELLOW 11/07/2020 0103   APPEARANCEUR HAZY (A) 11/07/2020 0103   LABSPEC 1.004 (L) 11/07/2020 0103   PHURINE 8.0 11/07/2020 0103   GLUCOSEU NEGATIVE 11/07/2020 0103   HGBUR SMALL (A) 11/07/2020 0103   BILIRUBINUR NEGATIVE 11/07/2020 0103   KETONESUR NEGATIVE 11/07/2020 0103   PROTEINUR NEGATIVE 11/07/2020 0103   UROBILINOGEN 1.0 10/07/2014 1625   NITRITE NEGATIVE 11/07/2020 0103   LEUKOCYTESUR MODERATE (A) 11/07/2020 0103   Sepsis Labs: @LABRCNTIP (procalcitonin:4,lacticidven:4)  ) Recent Results (from the past 240 hour(s))  Culture, blood (routine x 2)     Status: None (Preliminary result)   Collection Time: 11/06/20  9:53 PM   Specimen: Right Antecubital; Blood  Result Value Ref Range Status   Specimen Description RIGHT ANTECUBITAL  Final   Special Requests   Final    Blood Culture adequate volume BOTTLES DRAWN AEROBIC AND ANAEROBIC   Culture   Final    NO GROWTH < 12 HOURS Performed at [facility], [address] [place], [facility] [address]    Report Status PENDING  Incomplete  Culture, blood (routine x 2)     Status: None (Preliminary result)   Collection Time: 11/06/20  9:58 PM   Specimen: BLOOD RIGHT  FOREARM  Result Value Ref Range Status   Specimen Description BLOOD RIGHT FOREARM  Final   Special Requests   Final    Blood Culture results may not be optimal due to an inadequate volume of blood received in culture bottles BOTTLES DRAWN AEROBIC AND ANAEROBIC   Culture   Final    NO GROWTH < 12 HOURS Performed at [facility], [address], [place], [facility] [address]    Report Status PENDING  Incomplete  Resp Panel by RT-PCR (Flu A&B, Covid) Nasopharyngeal Swab     Status: None   Collection Time: 11/06/20 10:02 PM   Specimen: Nasopharyngeal Swab; Nasopharyngeal(NP) swabs in vial transport medium  Result Value Ref Range Status   SARS Coronavirus 2 by RT PCR NEGATIVE NEGATIVE Final    Comment: (NOTE) SARS-CoV-2 target nucleic acids are NOT DETECTED.  The SARS-CoV-2 RNA is generally detectable in upper respiratory specimens during the  acute phase of infection. The lowest concentration of SARS-CoV-2 viral copies this assay can detect is 138 copies/mL. A negative result does not preclude SARS-Cov-2 infection and should not be used as the sole basis for treatment or other patient management decisions. A negative result may occur with  improper specimen collection/handling, submission of specimen other than nasopharyngeal swab, presence of viral mutation(s) within the areas targeted by this assay, and inadequate number of viral copies(<138 copies/mL). A negative result must be combined with clinical observations, patient history, and epidemiological information. The expected result is Negative.  Fact Sheet for Patients:  BloggerCourse.com  Fact Sheet for Healthcare Providers:  SeriousBroker.it  This test is no t yet approved or cleared by the [place] FDA and  has been authorized for detection and/or diagnosis of SARS-CoV-2 by FDA under an Emergency Use Authorization (EUA). This EUA will remain  in effect (meaning this test can  be used) for the duration of the COVID-19 declaration under Section 564(b)(1) of the Act, 21 U.S.C.section 360bbb-3(b)(1), unless the authorization is terminated  or revoked sooner.       Influenza A by PCR NEGATIVE NEGATIVE Final   Influenza B by PCR NEGATIVE NEGATIVE Final    Comment: (NOTE) The Xpert Xpress SARS-CoV-2/FLU/RSV plus assay is intended as an aid in the diagnosis of influenza from Nasopharyngeal swab specimens and should not be used as a sole basis for treatment. Nasal washings and aspirates are unacceptable for Xpert Xpress SARS-CoV-2/FLU/RSV testing.  Fact Sheet for Patients: BloggerCourse.com  Fact Sheet for Healthcare Providers: SeriousBroker.it  This test is not yet approved or cleared by the [place] FDA and has been authorized for detection and/or diagnosis of SARS-CoV-2 by FDA under an Emergency Use Authorization (EUA). This EUA will remain in effect (meaning this test can be used) for the duration of the COVID-19 declaration under Section 564(b)(1) of the Act, 21 U.S.C. section 360bbb-3(b)(1), unless the authorization is terminated or revoked.  Performed at [facility], [address] [place], [place] [address]   C Difficile Quick Screen w PCR reflex     Status: None   Collection Time: 11/07/20  1:03 AM   Specimen: STOOL  Result Value Ref Range Status   C Diff antigen NEGATIVE NEGATIVE Final   C Diff toxin NEGATIVE NEGATIVE Final   C Diff interpretation No C. difficile detected.  Final    Comment: Performed at [facility], [address] [place], [place] [address]  Urine Culture     Status: Abnormal   Collection Time: 11/07/20  1:03 AM   Specimen: Urine, Clean Catch  Result Value Ref Range Status   Specimen Description   Final    URINE, CLEAN CATCH Performed at [facility], [address], [place], [place] [address]    Special Requests   Final    NONE Performed at [facility], [address] [place], [place] [address]    Culture MULTIPLE SPECIES PRESENT, SUGGEST RECOLLECTION (A)  Final   Report Status 11/08/2020 FINAL  Final         Radiology Studies: DG Tibia/Fibula Right  Result Date: 11/06/2020 CLINICAL DATA:  Right tib fib wound/infection EXAM: RIGHT TIBIA AND FIBULA - 2 VIEW COMPARISON:  None. FINDINGS: No fracture or dislocation is seen. The joint spaces are preserved. Mild soft tissue ulceration along the anterior aspect of the mid tibia on the lateral view. No radiopaque foreign body is seen. IMPRESSION: Mild soft tissue ulceration along the anterior aspect of the mid tibia  on the lateral view. No fracture, dislocation, or radiopaque foreign body is seen. Electronically Signed   By: Charline Bills M.D.   On: 11/06/2020 23:04   CT ABDOMEN PELVIS W CONTRAST  Result Date: 11/07/2020 CLINICAL DATA:  Abdominal pain, diarrhea EXAM: CT ABDOMEN AND PELVIS WITH CONTRAST TECHNIQUE: Multidetector CT imaging of the abdomen and pelvis was performed using the standard protocol following bolus administration of intravenous contrast. CONTRAST:  32mL OMNIPAQUE IOHEXOL 350 MG/ML SOLN COMPARISON:  11/14/2017 FINDINGS: Lower chest: Lung bases are clear. Hepatobiliary: Liver is within normal limits. Gallbladder is unremarkable. No intrahepatic or extrahepatic ductal dilatation. Pancreas: Within normal limits. Spleen: Within normal limits. Adrenals/Urinary Tract: Adrenal glands are within normal limits. Left kidney is within normal limits. Right kidney is notable for an extrarenal pelvis with moderate hydroureteronephrosis. Distended bladder. Stomach/Bowel: Stomach is notable for a large hiatal hernia/inverted intrathoracic stomach. No evidence of bowel obstruction. Normal appendix (series 2/image 32). Large rectal stool burden (series 2/image 63). Vascular/Lymphatic: No evidence of abdominal aortic aneurysm. Atherosclerotic calcifications of the abdominal aorta and branch vessels. No  suspicious abdominopelvic lymphadenopathy. Reproductive: Status post hysterectomy. No adnexal masses. Other: No abdominopelvic ascites. Musculoskeletal: Degenerative changes of the lumbar spine with mild anterior wedging at L2, chronic. Lumbar levoscoliosis. Left parasymphyseal fracture deformity, chronic. IMPRESSION: Large rectal stool burden/fecal impaction. No evidence of bowel obstruction.  Normal appendix. Large hiatal hernia/inverted intrathoracic stomach. Additional ancillary findings as above. Electronically Signed   By: Charline Bills M.D.   On: 11/07/2020 02:32   US Venous Img Lower Unilateral Right (DVT)  Result Date: 11/07/2020 CLINICAL DATA:  Right lower extremity edema and redness EXAM: RIGHT LOWER EXTREMITY VENOUS DOPPLER ULTRASOUND TECHNIQUE: Gray-scale sonography with compression, as well as color and duplex ultrasound, were performed to evaluate the deep venous system(s) from the level of the common femoral vein through the popliteal and proximal calf veins. COMPARISON:  None. FINDINGS: VENOUS Normal compressibility of the common femoral, superficial femoral, and popliteal veins, as well as the visualized calf veins. Visualized portions of profunda femoral vein and great saphenous vein unremarkable. No filling defects to suggest DVT on grayscale or color Doppler imaging. Doppler waveforms show normal direction of venous flow, normal respiratory plasticity and response to augmentation. Limited views of the contralateral common femoral vein are unremarkable. OTHER None. Limitations: none IMPRESSION: No right lower extremity DVT. Electronically Signed   By: Acquanetta Belling M.D.   On: 11/07/2020 11:32        Scheduled Meds:  acetaminophen  1,000 mg Oral Q6H   atorvastatin  80 mg Oral Daily   carvedilol  3.125 mg Oral BID WC   levothyroxine  88 mcg Oral Daily   lisinopril  2.5 mg Oral Daily   Continuous Infusions:  cefTRIAXone (ROCEPHIN)  IV 1 g (11/08/20 0522)     LOS: 1 day     Time spent:  Zannie Cove, MD [facility]   11/08/2020, 1:54 PM

## 2020-11-09 DIAGNOSIS — E785 Hyperlipidemia, unspecified: Secondary | ICD-10-CM

## 2020-11-09 DIAGNOSIS — I10 Essential (primary) hypertension: Secondary | ICD-10-CM | POA: Diagnosis not present

## 2020-11-09 DIAGNOSIS — L03115 Cellulitis of right lower limb: Secondary | ICD-10-CM | POA: Diagnosis not present

## 2020-11-09 DIAGNOSIS — E039 Hypothyroidism, unspecified: Secondary | ICD-10-CM

## 2020-11-09 DIAGNOSIS — E876 Hypokalemia: Secondary | ICD-10-CM | POA: Diagnosis not present

## 2020-11-09 DIAGNOSIS — E038 Other specified hypothyroidism: Secondary | ICD-10-CM | POA: Diagnosis not present

## 2020-11-09 LAB — BASIC METABOLIC PANEL
Anion gap: 10 (ref 5–15)
BUN: 11 mg/dL (ref 8–23)
CO2: 22 mmol/L (ref 22–32)
Calcium: 8.7 mg/dL — ABNORMAL LOW (ref 8.9–10.3)
Chloride: 105 mmol/L (ref 98–111)
Creatinine, Ser: 0.58 mg/dL (ref 0.44–1.00)
GFR, Estimated: >60 mL/min (ref >60)
Glucose, Bld: 86 mg/dL (ref 70–99)
Potassium: 3.6 mmol/L (ref 3.5–5.1)
Sodium: 137 mmol/L (ref 135–145)

## 2020-11-09 LAB — CBC
HCT: 31.9 % — ABNORMAL LOW (ref 36.0–46.0)
Hemoglobin: 10.4 g/dL — ABNORMAL LOW (ref 12.0–15.0)
MCH: 31.7 pg (ref 26.0–34.0)
MCHC: 32.6 g/dL (ref 30.0–36.0)
MCV: 97.3 fL (ref 80.0–100.0)
Platelets: 167 10*3/uL (ref 150–400)
RBC: 3.28 MIL/uL — ABNORMAL LOW (ref 3.87–5.11)
RDW: 13.5 % (ref 11.5–15.5)
WBC: 4.5 10*3/uL (ref 4.0–10.5)
nRBC: 0 % (ref 0.0–0.2)

## 2020-11-09 MED ORDER — SACCHAROMYCES BOULARDII 250 MG PO CAPS: 250.0000 mg | ORAL_CAPSULE | Freq: Two times a day (BID) | ORAL | 0 refills | Status: AC

## 2020-11-09 MED ORDER — CLINDAMYCIN HCL 150 MG PO CAPS: 150.0000 mg | ORAL_CAPSULE | Freq: Three times a day (TID) | ORAL | 0 refills | Status: AC

## 2020-11-09 NOTE — Discharge Summary (Signed)
Physician Discharge Summary  Charlene Vincent XNT:[id number] DOB: 1934/04/07 DOA: 11/06/2020  PCP: Richmond Campbell., PA-C  Admit date: 11/06/2020 Discharge date: 11/09/2020  Time spent: 35 minutes  Recommendations for Outpatient Follow-up:  Repeat basic metabolic panel to follow lites and renal function Reassessed patient right lower extremity for complete resolution of cellulitic process. Reassess blood pressure and adjust antihypertensive regimen as required.   Discharge Diagnoses:  Hyperlipidemia  hypothyroidism Right leg Cellulitis Hypokalemia History of Takotsubo Coronary disease Hypertension   Discharge Condition: Stable and improved.  Patient discharged home with instruction to follow-up with PCP in 10 days.  Code status: Full Code.  Diet recommendation: Heart healthy and low-sodium diet.  Filed Weights   11/06/20 1816  Weight: 57.6 kg    History of present illness:  86 year old female with history of Takotsubo's cardiomyopathy, CAD, hypothyroidism, essential tremor, was seen in the ER on 9/10 after striking her shin on a piece of furniture with resultant wound and bleeding, she was discharged home on oral doxycycline after wound care, presented back to the ER on 9/14 overnight with pain and swelling in her right leg, in the ER noted to have significant erythema, some drainage. -In the ED also had abdominal discomfort had a CT abdomen which noted fecal impaction, subsequently had an episode of urinary retention requiring I/O cath [facility] Course:  1-right lower extremity cellulitis -Patient developed cellulitis after experiencing injury and wound to her right leg a week ago -Patient failed outpatient management with doxycycline -Excellent response to the use of ceftriaxone; at this moment significant improvement in swelling, pain and erythematous changes -No active drainage. -Lower extremity DVT rule out with negative Dopplers. -Patient will be discharged  home on oral clindamycin to complete antibiotic therapy -Follow-up with PCP in 10 days.  2-urinary retention/hematuria -Patient ended requiring in and out catheterization on 9/14 midnight. -Since then has been able to spontaneously -Denies dysuria -No further significant bleeding appreciated -Advised to maintain adequate hydration.  3-hypokalemia -Repleted and within normal limits.  4-constipation -Continue as needed laxative -Patient with good bowel movement after receiving Senokot and Fleet enema.  5-history of Takotsubo cardiomyopathy/coronary artery disease -Stable and well-controlled -Denies chest pain, shortness of breath and orthopnea. -Continue carvedilol and lisinopril -Advised to follow heart healthy diet.  6-hypothyroidism -Continue Synthroid  7-Hyperlipidemia -Continue statins.  Procedures: See below for x-ray reports.  Consultations: None  Discharge Exam: Vitals:   11/09/20 0803 11/09/20 1435  BP:  102/76  Pulse: 71 67  Resp:  18  Temp:  (!) 97.5 F (36.4 C)  SpO2:  100%    General: Afebrile, no chest pain, no nausea, no vomiting.  Patient following commands appropriately in no acute distress and wants to go home. Cardiovascular: S1 and S2, regular rate and rhythm; no rubs, no gallops, no JVD. Respiratory: Good air movement bilaterally, no using accessory muscles; good saturation on room air. Abdomen: Soft, nontender, nondistended, positive bowel sounds Extremities: Improvement in patient's right lower extremity swelling and erythema; still slightly tender but able to bear weight and ambulate without difficulty or assistance.  Discharge Instructions  Discharge Instructions     Diet - low sodium heart healthy   Complete by: As directed    Discharge instructions   Complete by: As directed    Take medications as prescribed Maintain adequate hydration Arrange follow-up with PCP in 10 days Continue to follow heart healthy/low-sodium diet. The  forget to keep leg elevated as much as possible to continue assisting with swelling  and erythematous changes improvement.      Allergies as of 11/09/2020       Reactions   Aspirin    But taking low dose therapy without problems.   Nystatin Itching   She said when she used the cream it made symptoms worse    Penicillins Rash        Medication List     STOP taking these medications    doxycycline 100 MG capsule Commonly known as: VIBRAMYCIN       TAKE these medications    acetaminophen 500 MG tablet Commonly known as: TYLENOL Take 1,000 mg by mouth every 6 (six) hours.   atorvastatin 80 MG tablet Commonly known as: LIPITOR Take 80 mg by mouth daily.   carvedilol 3.125 MG tablet Commonly known as: COREG Take 1 tablet (3.125 mg total) by mouth 2 (two) times daily with a meal.   clindamycin 150 MG capsule Commonly known as: Cleocin Take 1 capsule (150 mg total) by mouth 3 (three) times daily for 5 days.   doxepin 10 MG capsule Commonly known as: SINEQUAN Take 10 mg by mouth daily as needed (itching).   fluticasone 50 MCG/ACT nasal spray Commonly known as: FLONASE Place 1-2 sprays into both nostrils daily.   levothyroxine 88 MCG tablet Commonly known as: SYNTHROID Take 88 mcg by mouth daily.   lisinopril 2.5 MG tablet Commonly known as: ZESTRIL Take 1 tablet (2.5 mg total) by mouth daily.   oxyCODONE-acetaminophen 5-325 MG tablet Commonly known as: PERCOCET/ROXICET Take 0.5-1 tablets by mouth as needed for moderate pain.   saccharomyces boulardii 250 MG capsule Commonly known as: Florastor Take 1 capsule (250 mg total) by mouth 2 (two) times daily for 10 days.       Allergies  Allergen Reactions   Aspirin     But taking low dose therapy without problems.   Nystatin Itching    She said when she used the cream it made symptoms worse    Penicillins Rash    Follow-up Information     Richmond Campbell., PA-C. Schedule an appointment as soon as  possible for a visit in 10 day(s).   Specialty: Family Medicine Contact information: [address] [place] [address] [phone]                 The results of significant diagnostics from this hospitalization (including imaging, microbiology, ancillary and laboratory) are listed below for reference.    Significant Diagnostic Studies: DG Tibia/Fibula Right  Result Date: 11/06/2020 CLINICAL DATA:  Right tib fib wound/infection EXAM: RIGHT TIBIA AND FIBULA - 2 VIEW COMPARISON:  None. FINDINGS: No fracture or dislocation is seen. The joint spaces are preserved. Mild soft tissue ulceration along the anterior aspect of the mid tibia on the lateral view. No radiopaque foreign body is seen. IMPRESSION: Mild soft tissue ulceration along the anterior aspect of the mid tibia on the lateral view. No fracture, dislocation, or radiopaque foreign body is seen. Electronically Signed   By: Charline Bills M.D.   On: 11/06/2020 23:04   DG Tibia/Fibula Right  Result Date: 11/02/2020 CLINICAL DATA:  Trauma last week.  Redness. EXAM: RIGHT TIBIA AND FIBULA - 2 VIEW COMPARISON:  05/03/2016 ankle films. FINDINGS: Mild osteopenia. No acute fracture or dislocation. Degenerative changes about the knee are mild. Suspect mild pretibial soft tissue swelling on the lateral view. Probable soft tissue calcification just cephalad to this area anteriorly. No knee joint effusion. Tibiotalar mild osteoarthritis as well. IMPRESSION:  No acute osseous abnormality. Suspicion of mild pretibial soft tissue swelling possible soft tissue calcification. Correlate with physical exam to exclude unlikely foreign body. Electronically Signed   By: Jeronimo Greaves M.D.   On: 11/02/2020 12:24   CT ABDOMEN PELVIS W CONTRAST  Result Date: 11/07/2020 CLINICAL DATA:  Abdominal pain, diarrhea EXAM: CT ABDOMEN AND PELVIS WITH CONTRAST TECHNIQUE: Multidetector CT imaging of the abdomen and pelvis was performed using the standard  protocol following bolus administration of intravenous contrast. CONTRAST:  16mL OMNIPAQUE IOHEXOL 350 MG/ML SOLN COMPARISON:  11/14/2017 FINDINGS: Lower chest: Lung bases are clear. Hepatobiliary: Liver is within normal limits. Gallbladder is unremarkable. No intrahepatic or extrahepatic ductal dilatation. Pancreas: Within normal limits. Spleen: Within normal limits. Adrenals/Urinary Tract: Adrenal glands are within normal limits. Left kidney is within normal limits. Right kidney is notable for an extrarenal pelvis with moderate hydroureteronephrosis. Distended bladder. Stomach/Bowel: Stomach is notable for a large hiatal hernia/inverted intrathoracic stomach. No evidence of bowel obstruction. Normal appendix (series 2/image 32). Large rectal stool burden (series 2/image 63). Vascular/Lymphatic: No evidence of abdominal aortic aneurysm. Atherosclerotic calcifications of the abdominal aorta and branch vessels. No suspicious abdominopelvic lymphadenopathy. Reproductive: Status post hysterectomy. No adnexal masses. Other: No abdominopelvic ascites. Musculoskeletal: Degenerative changes of the lumbar spine with mild anterior wedging at L2, chronic. Lumbar levoscoliosis. Left parasymphyseal fracture deformity, chronic. IMPRESSION: Large rectal stool burden/fecal impaction. No evidence of bowel obstruction.  Normal appendix. Large hiatal hernia/inverted intrathoracic stomach. Additional ancillary findings as above. Electronically Signed   By: Charline Bills M.D.   On: 11/07/2020 02:32   US Venous Img Lower Unilateral Right (DVT)  Result Date: 11/07/2020 CLINICAL DATA:  Right lower extremity edema and redness EXAM: RIGHT LOWER EXTREMITY VENOUS DOPPLER ULTRASOUND TECHNIQUE: Gray-scale sonography with compression, as well as color and duplex ultrasound, were performed to evaluate the deep venous system(s) from the level of the common femoral vein through the popliteal and proximal calf veins. COMPARISON:  None.  FINDINGS: VENOUS Normal compressibility of the common femoral, superficial femoral, and popliteal veins, as well as the visualized calf veins. Visualized portions of profunda femoral vein and great saphenous vein unremarkable. No filling defects to suggest DVT on grayscale or color Doppler imaging. Doppler waveforms show normal direction of venous flow, normal respiratory plasticity and response to augmentation. Limited views of the contralateral common femoral vein are unremarkable. OTHER None. Limitations: none IMPRESSION: No right lower extremity DVT. Electronically Signed   By: Acquanetta Belling M.D.   On: 11/07/2020 11:32    Microbiology: Recent Results (from the past 240 hour(s))  Culture, blood (routine x 2)     Status: None (Preliminary result)   Collection Time: 11/06/20  9:53 PM   Specimen: Right Antecubital; Blood  Result Value Ref Range Status   Specimen Description RIGHT ANTECUBITAL  Final   Special Requests   Final    Blood Culture adequate volume BOTTLES DRAWN AEROBIC AND ANAEROBIC   Culture   Final    NO GROWTH 3 DAYS Performed at [facility], [address] [place], [place] [address]    Report Status PENDING  Incomplete  Culture, blood (routine x 2)     Status: None (Preliminary result)   Collection Time: 11/06/20  9:58 PM   Specimen: BLOOD RIGHT FOREARM  Result Value Ref Range Status   Specimen Description BLOOD RIGHT FOREARM  Final   Special Requests   Final    Blood Culture results may not be optimal due to an inadequate volume of  blood received in culture bottles BOTTLES DRAWN AEROBIC AND ANAEROBIC   Culture   Final    NO GROWTH 3 DAYS Performed at [facility], [address] [place], [place] [address]    Report Status PENDING  Incomplete  Resp Panel by RT-PCR (Flu A&B, Covid) Nasopharyngeal Swab     Status: None   Collection Time: 11/06/20 10:02 PM   Specimen: Nasopharyngeal Swab; Nasopharyngeal(NP) swabs in vial transport medium  Result Value Ref Range Status    SARS Coronavirus 2 by RT PCR NEGATIVE NEGATIVE Final    Comment: (NOTE) SARS-CoV-2 target nucleic acids are NOT DETECTED.  The SARS-CoV-2 RNA is generally detectable in upper respiratory specimens during the acute phase of infection. The lowest concentration of SARS-CoV-2 viral copies this assay can detect is 138 copies/mL. A negative result does not preclude SARS-Cov-2 infection and should not be used as the sole basis for treatment or other patient management decisions. A negative result may occur with  improper specimen collection/handling, submission of specimen other than nasopharyngeal swab, presence of viral mutation(s) within the areas targeted by this assay, and inadequate number of viral copies(<138 copies/mL). A negative result must be combined with clinical observations, patient history, and epidemiological information. The expected result is Negative.  Fact Sheet for Patients:  BloggerCourse.com  Fact Sheet for Healthcare Providers:  SeriousBroker.it  This test is no t yet approved or cleared by the [place] FDA and  has been authorized for detection and/or diagnosis of SARS-CoV-2 by FDA under an Emergency Use Authorization (EUA). This EUA will remain  in effect (meaning this test can be used) for the duration of the COVID-19 declaration under Section 564(b)(1) of the Act, 21 U.S.C.section 360bbb-3(b)(1), unless the authorization is terminated  or revoked sooner.       Influenza A by PCR NEGATIVE NEGATIVE Final   Influenza B by PCR NEGATIVE NEGATIVE Final    Comment: (NOTE) The Xpert Xpress SARS-CoV-2/FLU/RSV plus assay is intended as an aid in the diagnosis of influenza from Nasopharyngeal swab specimens and should not be used as a sole basis for treatment. Nasal washings and aspirates are unacceptable for Xpert Xpress SARS-CoV-2/FLU/RSV testing.  Fact Sheet for  Patients: BloggerCourse.com  Fact Sheet for Healthcare Providers: SeriousBroker.it  This test is not yet approved or cleared by the [place] FDA and has been authorized for detection and/or diagnosis of SARS-CoV-2 by FDA under an Emergency Use Authorization (EUA). This EUA will remain in effect (meaning this test can be used) for the duration of the COVID-19 declaration under Section 564(b)(1) of the Act, 21 U.S.C. section 360bbb-3(b)(1), unless the authorization is terminated or revoked.  Performed at [facility], [address] [place], [place] [address]   C Difficile Quick Screen w PCR reflex     Status: None   Collection Time: 11/07/20  1:03 AM   Specimen: STOOL  Result Value Ref Range Status   C Diff antigen NEGATIVE NEGATIVE Final   C Diff toxin NEGATIVE NEGATIVE Final   C Diff interpretation No C. difficile detected.  Final    Comment: Performed at [facility], [address], [place], [place] [address]  Urine Culture     Status: Abnormal   Collection Time: 11/07/20  1:03 AM   Specimen: Urine, Clean Catch  Result Value Ref Range Status   Specimen Description   Final    URINE, CLEAN CATCH Performed at [facility], [address], [place], [place] [address]    Special Requests  Final    NONE Performed at [facility], [address] [place], [place] [address]    Culture MULTIPLE SPECIES PRESENT, SUGGEST RECOLLECTION (A)  Final   Report Status 11/08/2020 FINAL  Final     Labs: Basic Metabolic Panel: Recent Labs  Lab 11/06/20 2152 11/07/20 0414 11/08/20 0549 11/09/20 0505  NA 132* 133* 134* 137  K 3.2* 3.1* 4.5 3.6  CL 100 101 107 105  CO2 27 27 24 22   GLUCOSE 101* 103* 100* 86  BUN 11 9 9 11   CREATININE 0.69 0.55 0.60 0.58  CALCIUM 9.0 8.2* 7.8* 8.7*   Liver Function Tests: Recent Labs  Lab 11/07/20 0414  AST 44*  ALT 41  ALKPHOS 74  BILITOT 0.5  PROT 6.4*  ALBUMIN 3.3*   CBC: Recent Labs   Lab 11/06/20 2152 11/07/20 0414 11/08/20 0549 11/09/20 0505  WBC 5.2 9.2 4.0 4.5  NEUTROABS 3.1  --   --   --   HGB 11.6* 11.3* 9.9* 10.4*  HCT 36.4 35.4* 31.2* 31.9*  MCV 98.1 97.5 99.0 97.3  PLT 159 154 145* 167   CBG: Recent Labs  Lab 11/08/20 0738  GLUCAP 87    Signed:  11/11/20 MD.  [facility] 11/09/2020, 2:49 PM

## 2020-11-09 NOTE — Plan of Care (Signed)
  Problem: Elimination: Goal: Will not experience complications related to bowel motility Outcome: Progressing   Problem: Skin Integrity: Goal: Risk for impaired skin integrity will decrease Outcome: Progressing   Problem: Activity: Goal: Risk for activity intolerance will decrease Outcome: Adequate for Discharge   Problem: Nutrition: Goal: Adequate nutrition will be maintained Outcome: Adequate for Discharge   Problem: Coping: Goal: Level of anxiety will decrease Outcome: Adequate for Discharge   Problem: Pain Managment: Goal: General experience of comfort will improve Outcome: Adequate for Discharge

## 2020-11-09 NOTE — Progress Notes (Signed)
Pt has ambulated to BR with standby assist 3 times since beginning of shift. Pt has voided clear yellow urine each time, no bleeding noted. Pt with no c/o pain in abd or in leg today. Tolerated breakfast well and requested additional bowl of cereal. Son and husband at bedside.

## 2020-11-09 NOTE — Progress Notes (Signed)
Nsg Discharge Note  Admit Date:  11/06/2020 Discharge date: 11/09/2020   [name] to be D/C'd Home per MD order.  AVS completed.  Patient's son Talli Kimmer able to verbalize understanding.  Discharge Medication: Allergies as of 11/09/2020       Reactions   Aspirin    But taking low dose therapy without problems.   Nystatin Itching   She said when she used the cream it made symptoms worse    Penicillins Rash        Medication List     STOP taking these medications    doxycycline 100 MG capsule Commonly known as: VIBRAMYCIN       TAKE these medications    acetaminophen 500 MG tablet Commonly known as: TYLENOL Take 1,000 mg by mouth every 6 (six) hours.   atorvastatin 80 MG tablet Commonly known as: LIPITOR Take 80 mg by mouth daily.   carvedilol 3.125 MG tablet Commonly known as: COREG Take 1 tablet (3.125 mg total) by mouth 2 (two) times daily with a meal.   clindamycin 150 MG capsule Commonly known as: Cleocin Take 1 capsule (150 mg total) by mouth 3 (three) times daily for 5 days.   doxepin 10 MG capsule Commonly known as: SINEQUAN Take 10 mg by mouth daily as needed (itching).   fluticasone 50 MCG/ACT nasal spray Commonly known as: FLONASE Place 1-2 sprays into both nostrils daily.   levothyroxine 88 MCG tablet Commonly known as: SYNTHROID Take 88 mcg by mouth daily.   lisinopril 2.5 MG tablet Commonly known as: ZESTRIL Take 1 tablet (2.5 mg total) by mouth daily.   oxyCODONE-acetaminophen 5-325 MG tablet Commonly known as: PERCOCET/ROXICET Take 0.5-1 tablets by mouth as needed for moderate pain.   saccharomyces boulardii 250 MG capsule Commonly known as: Florastor Take 1 capsule (250 mg total) by mouth 2 (two) times daily for 10 days.        Discharge Assessment: Vitals:   11/09/20 0803 11/09/20 1435  BP:  102/76  Pulse: 71 67  Resp:  18  Temp:  (!) 97.5 F (36.4 C)  SpO2:  100%   Skin clean, dry and intact without  evidence of skin break down, no evidence of skin tears noted. IV catheter discontinued intact. Site without signs and symptoms of complications - no redness or edema noted at insertion site, patient denies c/o pain - only slight tenderness at site.  Dressing with slight pressure applied.  D/c Instructions-Education: Discharge instructions given to patient's son Allisyn Kunz with verbalized understanding. D/c education completed with patient's son including follow up instructions, medication list, d/c activities limitations if indicated, with other d/c instructions as indicated by MD - patient's son able to verbalize understanding, all questions fully answered. Patient instructed to return to ED, call 911, or call MD for any changes in condition.  Patient escorted via WC, and D/C home via private auto.  Jethro Poling, RN 11/09/2020 3:45 PM

## 2020-11-11 LAB — CULTURE, BLOOD (ROUTINE X 2)
Culture: NO GROWTH
Culture: NO GROWTH
Special Requests: ADEQUATE

## 2021-02-01 ENCOUNTER — Emergency Department (HOSPITAL_COMMUNITY)
Admission: EM | Admit: 2021-02-01 | Discharge: 2021-02-01 | Disposition: A | Discharge status: Home / Self Care | Payer: Medicare Other | Attending: Emergency Medicine | Admitting: Emergency Medicine

## 2021-02-01 ENCOUNTER — Encounter (HOSPITAL_COMMUNITY): Payer: Self-pay | Admitting: Emergency Medicine

## 2021-02-01 ENCOUNTER — Other Ambulatory Visit: Payer: Self-pay

## 2021-02-01 DIAGNOSIS — I252 Old myocardial infarction: Secondary | ICD-10-CM | POA: Diagnosis not present

## 2021-02-01 DIAGNOSIS — K649 Unspecified hemorrhoids: Secondary | ICD-10-CM | POA: Insufficient documentation

## 2021-02-01 DIAGNOSIS — Z87891 Personal history of nicotine dependence: Secondary | ICD-10-CM | POA: Diagnosis not present

## 2021-02-01 DIAGNOSIS — B3731 Acute candidiasis of vulva and vagina: Secondary | ICD-10-CM | POA: Diagnosis not present

## 2021-02-01 DIAGNOSIS — E039 Hypothyroidism, unspecified: Secondary | ICD-10-CM | POA: Insufficient documentation

## 2021-02-01 DIAGNOSIS — B3749 Other urogenital candidiasis: Secondary | ICD-10-CM

## 2021-02-01 MED ORDER — CLOTRIMAZOLE 1 % VA CREA
1.0000 | TOPICAL_CREAM | Freq: Every day | VAGINAL | 0 refills | Status: AC
Start: 2021-02-01 — End: ?

## 2021-02-01 NOTE — Discharge Instructions (Addendum)
You were seen here today for valuation of your hemorrhoids.  Your hemorrhoid looks like it is improving, however I think the hemorrhoid cream is causing some irritation around your anus.  You been prescribed clotrimazole, topical antifungal, to apply to the area after every bowel movement and before going to sleep.  Please try to keep the area as dry as possible.  Additionally, please stop using hemorrhoid cream as it induces worsening the problem.  As previously discussed, you can use Desitin cream that comes in a purple and white tube as shown to you in the room.  Additionally, please apply this on top of the clotrimazole to create a barrier to prevent further irritation.  If you have any concern, new or worsening symptoms, please return to the nearest emergency department for reevaluation.

## 2021-02-01 NOTE — ED Provider Notes (Signed)
Emergency Medicine Provider Triage Evaluation Note  Charlene Vincent , a 86 y.o. female  was evaluated in triage.  Pt complains of gradually improving hemorrhoids for the past week.  She reports she noted some bright red blood on the toilet paper today.  She denies any constipation and reports she has at least 3-4 bowel movements a day.  She denies any abdominal pain.  She reports that her hemorrhoids have been gradually improving this past week.  Denies any history of hemorrhoids.  Denies any urinary complaints.  Review of Systems  Positive: Hemorrhoid Negative: Abdominal pain, urinary frequency/urgency, dysuria, hematuria  Physical Exam  BP (!) 123/53 (BP Location: Right Arm)   Pulse 61   Temp 98.6 F (37 C) (Oral)   Resp 17   Ht 5\' 7"  (1.702 m)   Wt 57.6 kg   SpO2 99%   BMI 19.89 kg/m  Gen:   Awake, no distress   Resp:  Normal effort  MSK:   Moves extremities without difficulty  Other:  Exam deferred  Medical Decision Making  Medically screening exam initiated at 10:27 AM.  Appropriate orders placed.  Charlene Vincent was informed that the remainder of the evaluation will be completed by another provider, this initial triage assessment does not replace that evaluation, and the importance of remaining in the ED until their evaluation is complete.  Exam deferred due to patient in vertical hallway.  Will examine patient further when in the main emergency department.[name], PA-C 02/01/21 1029    14/10/22, MD 02/02/21 [phone]

## 2021-02-01 NOTE — ED Triage Notes (Signed)
Pt to the ED with complaints of hemorrhoids for the past week. Pt states they do not hurt as bad today as they did in the previous week.

## 2021-02-01 NOTE — ED Provider Notes (Signed)
[facility] EMERGENCY DEPARTMENT Provider Note   CSN: [id number] Arrival date & time: 02/01/21  1005     History Chief Complaint  Patient presents with   Hemorrhoids    Charlene Vincent is a 86 y.o. female presents emerged from for evaluation of gradually improving hemorrhoid for the past week.  She reports she has noticed a little small amount of bright red blood on the tissue paper when wiping.  Denies any dark or tarry stools.  Denies any blood drops in the toilet.  Denies any urinary complaints.  Denies any abdominal pain.  Patient reports she does get hemorrhoids frequently. Allergic to nystatin. Denies any tobacco, EtOH, or drug use.   HPI     Past Medical History:  Diagnosis Date   Anemia    Arthritis    Cellulitis    Costochondritis    Headache    Hypothyroidism    Myocardial infarction [facility]) 2012, 2016   both Takotsubo   Pneumonia    Shingles    Takotsubo cardiomyopathy    a. 2012  b. LHC in 09/2014 nl cors, EF 30-35%>>45-50% by echo 11/2014   Tremor, essential     Patient Active Problem List   Diagnosis Date Noted   Hypokalemia 11/07/2020   Abdominal pain 11/07/2020   Cellulitis 11/06/2020   Open wound of foot except toe(s) alone 01/22/2015   Hyperlipidemia 10/25/2014   STEMI (ST elevation myocardial infarction) ([place]) 10/09/2014   Foot laceration    Fever    Acute MI ([place]) 10/02/2014   Foot fracture 10/02/2014   Stress-induced cardiomyopathy 10/02/2014   Demand ischemia ([place]) 10/02/2014   Takotsubo cardiomyopathy    Hyponatremia 07/20/2011   Leukopenia 07/20/2011   Abscess or cellulitis of gluteal region 07/17/2011   Hypothyroidism 07/17/2011   Anemia 07/17/2011   HTN (hypertension) 07/17/2011    Past Surgical History:  Procedure Laterality Date   ABDOMINAL HYSTERECTOMY     APPLICATION OF A-CELL OF EXTREMITY Left 01/22/2015   Procedure: APPLICATION OF A-CELL TO LEFT THIGH ;  Surgeon: Irene Limbo, MD;  Location: [place];  Service:  Plastics;  Laterality: Left;   APPLICATION OF WOUND VAC Left 10/09/2014   Procedure: APPLICATION OF WOUND VAC;  Surgeon: Meredith Pel, MD;  Location: [place];  Service: Orthopedics;  Laterality: Left;   CARDIAC CATHETERIZATION  September 10, 2010   EF 35 to 40%. No significant CAD. ? takotsubo cardiomyopathy   CARDIAC CATHETERIZATION N/A 10/02/2014   Procedure: Left Heart Cath and Coronary Angiography;  Surgeon: Peter M Martinique, MD;  Location: [place] [facility];  Service: Cardiovascular;  Laterality: N/A;   I & D EXTREMITY Left 10/09/2014   Procedure: DEBRIDEMENT LEFT FOOT;  Surgeon: Meredith Pel, MD;  Location: [place];  Service: Orthopedics;  Laterality: Left;   INGUINAL HERNIA REPAIR     SKIN FULL THICKNESS GRAFT Left 10/12/2014   Procedure: Debridement left foot, application integra, wound vacuum-assisted closure;  Surgeon: Irene Limbo, MD;  Location: [place];  Service: Plastics;  Laterality: Left;   SKIN GRAFT Left 01/22/2015   left thigh to left foot   SKIN SPLIT GRAFT Left 01/22/2015   Procedure:  SPLIT THICKNESS SKIN GRAFT FROM LEFT THIGH TO LEFT FOOT ;  Surgeon: Irene Limbo, MD;  Location: [place];  Service: Plastics;  Laterality: Left;     OB History   No obstetric history on file.     Family History  Problem Relation Age of Onset   Heart disease Father  Heart disease Brother     Social History   Tobacco Use   Smoking status: Former    Packs/day: 0.50    Types: Cigarettes    Quit date: 09/07/2010    Years since quitting: 10.4   Smokeless tobacco: Never  Vaping Use   Vaping Use: Never used  Substance Use Topics   Alcohol use: No   Drug use: No    Home Medications Prior to Admission medications   Medication Sig Start Date End Date Taking? Authorizing Provider  clotrimazole (GYNE-LOTRIMIN) 1 % vaginal cream Place 1 Applicatorful vaginally at bedtime. 02/01/21  Yes Sherrell Puller, PA-C  acetaminophen (TYLENOL) 500 MG tablet Take 1,000 mg by mouth every 6  (six) hours.    [provider]  atorvastatin (LIPITOR) 80 MG tablet Take 80 mg by mouth daily.    [provider]  carvedilol (COREG) 3.125 MG tablet Take 1 tablet (3.125 mg total) by mouth 2 (two) times daily with a meal. 03/25/20   Martinique, Peter M, MD  doxepin (SINEQUAN) 10 MG capsule Take 10 mg by mouth daily as needed (itching).    [provider]  fluticasone (FLONASE) 50 MCG/ACT nasal spray Place 1-2 sprays into both nostrils daily. 09/02/16   [provider]  levothyroxine (SYNTHROID, LEVOTHROID) 88 MCG tablet Take 88 mcg by mouth daily. 04/08/16   [provider]  lisinopril (ZESTRIL) 2.5 MG tablet Take 1 tablet (2.5 mg total) by mouth daily. 03/25/20   Martinique, Peter M, MD  oxyCODONE-acetaminophen (PERCOCET/ROXICET) 5-325 MG tablet Take 0.5-1 tablets by mouth as needed for moderate pain. 10/24/20   [provider]    Allergies    Aspirin, Nystatin, and Penicillins  Review of Systems   Review of Systems  Constitutional:  Negative for chills and fever.  HENT:  Negative for ear pain and sore throat.   Eyes:  Negative for pain and visual disturbance.  Respiratory:  Negative for cough and shortness of breath.   Cardiovascular:  Negative for chest pain and palpitations.  Gastrointestinal:  Positive for rectal pain. Negative for abdominal pain, blood in stool, constipation, diarrhea, nausea and vomiting.  Genitourinary:  Negative for dysuria and hematuria.  Musculoskeletal:  Negative for arthralgias and back pain.  Skin:  Negative for color change and rash.  Neurological:  Negative for seizures and syncope.  All other systems reviewed and are negative.  Physical Exam Updated Vital Signs BP 122/61   Pulse 63   Temp 98.4 F (36.9 C) (Oral)   Resp 16   Ht 5\' 7"  (1.702 m)   Wt 57.6 kg   SpO2 98%   BMI 19.89 kg/m   Physical Exam Vitals and nursing note reviewed. Exam conducted with a chaperone present Jonelle Sidle, RN).   Constitutional:      General: She is not in acute distress.    Appearance: Normal appearance. She is not toxic-appearing.  Eyes:     General: No scleral icterus. Cardiovascular:     Rate and Rhythm: Normal rate.  Pulmonary:     Effort: Pulmonary effort is normal. No respiratory distress.  Abdominal:     Tenderness: There is no abdominal tenderness. There is no guarding or rebound.  Genitourinary:    Comments: Beefy, red surround skin of the anus. Small external hemorrhoid noted at the 2-6 o'clock position. Not thrombosed. Concern for irritant dermatitis. Skin:    General: Skin is dry.     Findings: No rash.  Neurological:     General: No focal  deficit present.     Mental Status: She is alert. Mental status is at baseline.  Psychiatric:        Mood and Affect: Mood normal.    ED Results / Procedures / Treatments   Labs (all labs ordered are listed, but only abnormal results are displayed) Labs Reviewed - No data to display  EKG None  Radiology No results found.  Procedures Procedures   Medications Ordered in ED Medications - No data to display  ED Course  I have reviewed the triage vital signs and the nursing notes.  Pertinent labs & imaging results that were available during my care of the patient were reviewed by me and considered in my medical decision making (see chart for details).  86 year old female with history of hemorrhoids presents emergency department for gradually improving hemorrhoid for the past week.  Patient ports she would just like it further evaluated.  On exam, chaperone Tiffany RN present, there is a small external hemorrhoid present at 2 and 6 o'clock position.  Not thrombosed.  No surrounding induration or fluctuance.  Patient has beefy red erythema to the surrounding area.  Concern for irritant dermatitis.  Patient does wear depends.  Will prescribe topical antifungal like clotrimazole since the patient is allergic to nystatin.  I discussed the  care plan with the patient and son in the room.  Recommended to apply the clotrimazole after every bowel movement and to apply Desitin, or another barrier cream, on top of the clotrimazole to allow the skin to heal.  Return precautions given.  The son reports that she has a follow-up with her PCP next week.  Recommended they reeval for the problem then.  She agrees to plan.  Patient is stable being discharged home in good condition.     MDM Rules/Calculators/A&P                          Final Clinical Impression(s) / ED Diagnoses Final diagnoses:  Hemorrhoids, unspecified hemorrhoid type  Candida infection of genital region    Rx / DC Orders ED Discharge Orders          Ordered    clotrimazole (GYNE-LOTRIMIN) 1 % vaginal cream  Daily at bedtime        02/01/21 1155             Achille Rich, [place] 02/01/21 1201    Pollyann Savoy, MD 02/02/21 1105

## 2021-07-20 ENCOUNTER — Other Ambulatory Visit: Payer: Self-pay

## 2021-07-20 ENCOUNTER — Emergency Department (HOSPITAL_COMMUNITY): Payer: Medicare Other

## 2021-07-20 ENCOUNTER — Encounter (HOSPITAL_COMMUNITY): Payer: Self-pay | Admitting: *Deleted

## 2021-07-20 ENCOUNTER — Emergency Department (HOSPITAL_COMMUNITY)
Admission: EM | Admit: 2021-07-20 | Discharge: 2021-07-21 | Disposition: A | Discharge status: Home / Self Care | Payer: Medicare Other | Attending: Emergency Medicine | Admitting: Emergency Medicine

## 2021-07-20 DIAGNOSIS — E039 Hypothyroidism, unspecified: Secondary | ICD-10-CM | POA: Diagnosis not present

## 2021-07-20 DIAGNOSIS — F0390 Unspecified dementia without behavioral disturbance: Secondary | ICD-10-CM | POA: Diagnosis not present

## 2021-07-20 DIAGNOSIS — W19XXXA Unspecified fall, initial encounter: Secondary | ICD-10-CM | POA: Diagnosis not present

## 2021-07-20 DIAGNOSIS — N3000 Acute cystitis without hematuria: Secondary | ICD-10-CM | POA: Diagnosis not present

## 2021-07-20 DIAGNOSIS — R251 Tremor, unspecified: Secondary | ICD-10-CM | POA: Diagnosis not present

## 2021-07-20 DIAGNOSIS — S81012A Laceration without foreign body, left knee, initial encounter: Secondary | ICD-10-CM | POA: Diagnosis not present

## 2021-07-20 DIAGNOSIS — S92412A Displaced fracture of proximal phalanx of left great toe, initial encounter for closed fracture: Secondary | ICD-10-CM | POA: Insufficient documentation

## 2021-07-20 DIAGNOSIS — I10 Essential (primary) hypertension: Secondary | ICD-10-CM | POA: Insufficient documentation

## 2021-07-20 DIAGNOSIS — Y92009 Unspecified place in unspecified non-institutional (private) residence as the place of occurrence of the external cause: Secondary | ICD-10-CM | POA: Insufficient documentation

## 2021-07-20 DIAGNOSIS — S99922A Unspecified injury of left foot, initial encounter: Secondary | ICD-10-CM | POA: Diagnosis present

## 2021-07-20 DIAGNOSIS — S80212A Abrasion, left knee, initial encounter: Secondary | ICD-10-CM

## 2021-07-20 DIAGNOSIS — Z23 Encounter for immunization: Secondary | ICD-10-CM | POA: Insufficient documentation

## 2021-07-20 LAB — CBC WITH DIFFERENTIAL/PLATELET
Abs Immature Granulocytes: 0.02 10*3/uL (ref 0.00–0.07)
Basophils Absolute: 0 10*3/uL (ref 0.0–0.1)
Basophils Relative: 1 %
Eosinophils Absolute: 0.2 10*3/uL (ref 0.0–0.5)
Eosinophils Relative: 3 %
HCT: 35.8 % — ABNORMAL LOW (ref 36.0–46.0)
Hemoglobin: 12.1 g/dL (ref 12.0–15.0)
Immature Granulocytes: 0 %
Lymphocytes Relative: 18 %
Lymphs Abs: 1.2 10*3/uL (ref 0.7–4.0)
MCH: 31.8 pg (ref 26.0–34.0)
MCHC: 33.8 g/dL (ref 30.0–36.0)
MCV: 94 fL (ref 80.0–100.0)
Monocytes Absolute: 0.9 10*3/uL (ref 0.1–1.0)
Monocytes Relative: 13 %
Neutro Abs: 4.3 10*3/uL (ref 1.7–7.7)
Neutrophils Relative %: 65 %
Platelets: 154 10*3/uL (ref 150–400)
RBC: 3.81 MIL/uL — ABNORMAL LOW (ref 3.87–5.11)
RDW: 12.6 % (ref 11.5–15.5)
WBC: 6.5 10*3/uL (ref 4.0–10.5)
nRBC: 0 % (ref 0.0–0.2)

## 2021-07-20 LAB — COMPREHENSIVE METABOLIC PANEL
ALT: 26 U/L (ref 0–44)
AST: 31 U/L (ref 15–41)
Albumin: 4 g/dL (ref 3.5–5.0)
Alkaline Phosphatase: 84 U/L (ref 38–126)
Anion gap: 7 (ref 5–15)
BUN: 12 mg/dL (ref 8–23)
CO2: 29 mmol/L (ref 22–32)
Calcium: 9.8 mg/dL (ref 8.9–10.3)
Chloride: 90 mmol/L — ABNORMAL LOW (ref 98–111)
Creatinine, Ser: 0.58 mg/dL (ref 0.44–1.00)
GFR, Estimated: >60 mL/min (ref >60)
Glucose, Bld: 103 mg/dL — ABNORMAL HIGH (ref 70–99)
Potassium: 3.8 mmol/L (ref 3.5–5.1)
Sodium: 126 mmol/L — ABNORMAL LOW (ref 135–145)
Total Bilirubin: 0.4 mg/dL (ref 0.3–1.2)
Total Protein: 7.1 g/dL (ref 6.5–8.1)

## 2021-07-20 LAB — URINALYSIS, ROUTINE W REFLEX MICROSCOPIC
Bilirubin Urine: NEGATIVE
Glucose, UA: NEGATIVE mg/dL
Hgb urine dipstick: NEGATIVE
Ketones, ur: NEGATIVE mg/dL
Nitrite: POSITIVE — AB
Protein, ur: NEGATIVE mg/dL
Specific Gravity, Urine: 1.005 (ref 1.005–1.030)
pH: 6 (ref 5.0–8.0)

## 2021-07-20 MED ORDER — POVIDONE-IODINE 10 % EX SOLN
CUTANEOUS | Status: AC
  Filled 2021-07-20: qty 14.8

## 2021-07-20 MED ORDER — BACITRACIN ZINC 500 UNIT/GM EX OINT
TOPICAL_OINTMENT | Freq: Once | CUTANEOUS | Status: DC
  Filled 2021-07-20: qty 1.8

## 2021-07-20 MED ORDER — SULFAMETHOXAZOLE-TRIMETHOPRIM 800-160 MG PO TABS
1.0000 | ORAL_TABLET | Freq: Once | ORAL | Status: AC
  Administered 2021-07-20: 1 via ORAL
  Filled 2021-07-20: qty 1

## 2021-07-20 MED ORDER — TETANUS-DIPHTH-ACELL PERTUSSIS 5-2.5-18.5 LF-MCG/0.5 IM SUSY
0.5000 mL | PREFILLED_SYRINGE | Freq: Once | INTRAMUSCULAR | Status: AC
  Administered 2021-07-20: 0.5 mL via INTRAMUSCULAR
  Filled 2021-07-20: qty 0.5

## 2021-07-20 MED ORDER — ACETAMINOPHEN 325 MG PO TABS
650.0000 mg | ORAL_TABLET | Freq: Once | ORAL | Status: AC
  Administered 2021-07-20: 650 mg via ORAL
  Filled 2021-07-20: qty 2

## 2021-07-20 MED ORDER — OXYCODONE HCL 5 MG PO TABS
5.0000 mg | ORAL_TABLET | Freq: Once | ORAL | Status: AC
  Administered 2021-07-20: 5 mg via ORAL
  Filled 2021-07-20: qty 1

## 2021-07-20 MED ORDER — SULFAMETHOXAZOLE-TRIMETHOPRIM 800-160 MG PO TABS: 1.0000 | ORAL_TABLET | Freq: Two times a day (BID) | ORAL | 0 refills | Status: AC

## 2021-07-20 MED ORDER — LIDOCAINE-EPINEPHRINE (PF) 2 %-1:200000 IJ SOLN
INTRAMUSCULAR | Status: AC
  Administered 2021-07-20: 20 mL via INTRADERMAL
  Filled 2021-07-20: qty 20

## 2021-07-20 MED ORDER — LIDOCAINE-EPINEPHRINE (PF) 2 %-1:200000 IJ SOLN: 20.0000 mL | Freq: Once | INTRAMUSCULAR | Status: AC

## 2021-07-20 MED ORDER — LIDOCAINE-EPINEPHRINE-TETRACAINE (LET) TOPICAL GEL
3.0000 mL | Freq: Once | TOPICAL | Status: AC
  Administered 2021-07-20: 3 mL via TOPICAL
  Filled 2021-07-20: qty 3

## 2021-07-20 NOTE — Discharge Instructions (Addendum)
An antibiotic by the name of Bactrim has been sent to your pharmacy.  Please take this as directed until complete.  Always take with plenty of food and water.  You have also been provided the contact information for a urologist by the name of Dr. Cardell Peach.  You may call and schedule a follow-up within the next week or so for reevaluation and continued medical management of your UTI.  You have also been placed in a boot.  Please limit your walking/mobility when using this boot, as this may be a trip hazard.  Rest and elevate your leg until you are able to follow-up with Dr. Logan Bores of the [facility].  Their contact information has been provided for you she may call first thing on Tuesday to schedule an appointment within the next few days.    There was no acute fracture or dislocation identified in the left knee.  Your knee has been stitched with sutures.  Please keep the area clean and prevent from submerging this in water.  You may change the bandage once every 1-2 days as described with the antibiotic ointment.  These sutures are non-resorbable and will need to be removed in the next 5-7 days by your PCP, an Urgent Care, or the Emergency Dept.  You may continue to manage the pain with Tylenol.  Also apply the topical bacitracin (antibiotic ointment) twice per day for the next 1 to 2 weeks.  Return to the ED for new or worsening symptoms as discussed.

## 2021-07-20 NOTE — ED Provider Notes (Signed)
[facility] EMERGENCY DEPARTMENT Provider Note   CSN: [id number] Arrival date & time: 07/20/21  1824     History {Add pertinent medical, surgical, social history, OB history to HPI:1} Chief Complaint  Patient presents with  . Fall    Charlene Vincent is a 87 y.o. female with chief complaint of injury sustained from a witnessed fall about 2 to 3 hours ago when she was moving around the living room.  Had just eaten dinner.  Patient unsure of why she fell, believes it was a mechanical cause.  Was in the living room per family member when the patient fell onto her left knee and left great toe.  Denies hitting head or losing consciousness.  Denies lightheadedness or dizziness before falling.  Denies any other bodily pains at this time.  No recent illness, upper respiratory infection, shortness of breath, chest pain, dizziness, lightheadedness, fevers, N/V/D, vision changes, headache.  Not on anticoagulation.  The history is provided by the patient and medical records.  Fall      Home Medications Prior to Admission medications   Medication Sig Start Date End Date Taking? Authorizing Provider  acetaminophen (TYLENOL) 500 MG tablet Take 1,000 mg by mouth every 6 (six) hours.    [provider]  atorvastatin (LIPITOR) 80 MG tablet Take 80 mg by mouth daily.    [provider]  carvedilol (COREG) 3.125 MG tablet Take 1 tablet (3.125 mg total) by mouth 2 (two) times daily with a meal. 03/25/20   [place], Peter M, MD  clotrimazole (GYNE-LOTRIMIN) 1 % vaginal cream Place 1 Applicatorful vaginally at bedtime. 02/01/21   Achille Rich, PA-C  doxepin (SINEQUAN) 10 MG capsule Take 10 mg by mouth daily as needed (itching).    [provider]  fluticasone (FLONASE) 50 MCG/ACT nasal spray Place 1-2 sprays into both nostrils daily. 09/02/16   [provider]  levothyroxine (SYNTHROID, LEVOTHROID) 88 MCG tablet Take 88 mcg by mouth daily. 04/08/16   [provider]  lisinopril (ZESTRIL) 2.5 MG tablet Take 1 tablet (2.5 mg total) by mouth daily. 03/25/20   [place], Peter M, MD  oxyCODONE-acetaminophen (PERCOCET/ROXICET) 5-325 MG tablet Take 0.5-1 tablets by mouth as needed for moderate pain. 10/24/20   [provider]      Allergies    Aspirin, Nystatin, and Penicillins    Review of Systems   Review of Systems  Musculoskeletal:        Left knee pain, left great toe pain, recent fall   Physical Exam Updated Vital Signs BP 128/77 (BP Location: Left Arm)   Pulse 73   Temp 98.8 F (37.1 C) (Oral)   Resp 18   Wt 59 kg   SpO2 98%   BMI 20.36 kg/m  Physical Exam Vitals and nursing note reviewed.  Constitutional:      General: She is not in acute distress.    Appearance: She is well-developed.  HENT:     Head: Normocephalic and atraumatic.  Eyes:     General: Lids are normal. Vision grossly intact. Gaze aligned appropriately. No scleral icterus.    Extraocular Movements: Extraocular movements intact.     Right eye: No nystagmus.     Left eye: No nystagmus.     Conjunctiva/sclera: Conjunctivae normal.     Pupils: Pupils are equal, round, and reactive to light.  Cardiovascular:     Rate and Rhythm: Normal rate and regular rhythm.     Pulses:  Radial pulses are 2+ on the right side and 2+ on the left side.       Dorsalis pedis pulses are 2+ on the right side and 2+ on the left side.       Posterior tibial pulses are 2+ on the right side and 2+ on the left side.     Heart sounds: No murmur heard. Pulmonary:     Effort: Pulmonary effort is normal. No respiratory distress.     Breath sounds: Normal breath sounds and air entry.  Abdominal:     Palpations: Abdomen is soft.     Tenderness: There is no abdominal tenderness.  Musculoskeletal:        General: No swelling.     Cervical back: Neck supple.     Right lower leg: No edema.     Left lower leg: No edema.     Right foot: Bunion present.     Left foot: Bunion present.        Feet:  Feet:     Right foot:     Toenail Condition: Right toenails are abnormally thick and long. Fungal disease present.    Left foot:     Toenail Condition: Left toenails are abnormally thick and long. Fungal disease present.    Comments: Mild swelling, ecchymosis, tenderness as depicted above.  No obvious bony deformity, erythema, temperature changes, or open wound.  No evidence of skin changes such as rash, abrasion, laceration.  CRT less than 2 seconds to all toes and feet bilaterally. Skin:    General: Skin is warm and dry.     Capillary Refill: Capillary refill takes less than 2 seconds.  Neurological:     Mental Status: She is alert and oriented to person, place, and time.     GCS: GCS eye subscore is 4. GCS verbal subscore is 5. GCS motor subscore is 6.     Cranial Nerves: No dysarthria or facial asymmetry.     Motor: Tremor present.     Comments: Known Hx of essential tremor.  No appreciated facial asymmetry, dysarthria, visual field deficits, pronator drift, abnormal finger-to-nose, abnormal EOMs, or unilateral weakness/abnormalities.  No active seizure activity.  Deferred gait assessment due to left knee and left great toe pain with imaging pending.  Psychiatric:        Mood and Affect: Mood normal.    ED Results / Procedures / Treatments   Labs (all labs ordered are listed, but only abnormal results are displayed) Labs Reviewed - No data to display  EKG None  Radiology DG Knee Complete 4 Views Left  Result Date: 07/20/2021 CLINICAL DATA:  Fall, pain EXAM: LEFT KNEE - COMPLETE 4+ VIEW COMPARISON:  04/26/2019 FINDINGS: No evidence of fracture, dislocation, or joint effusion. No evidence of arthropathy or other focal bone abnormality. Soft tissues are unremarkable. IMPRESSION: No fracture or dislocation of the left knee. Joint spaces are preserved. Electronically Signed   By: Jearld Lesch M.D.   On: 07/20/2021 19:33   DG Foot Complete Left  Result Date:  07/20/2021 CLINICAL DATA:  Fall, pain EXAM: LEFT FOOT - COMPLETE 3+ VIEW COMPARISON:  10/02/2014 FINDINGS: Hammertoe deformities generally limit evaluation. Suspect an angulated fracture of the base of the left great toe proximal phalanx. There is an underlying bunion deformity, which is substantially increased compared to prior examination dated 10/02/2014. No other evident fracture. Mild first metatarsophalangeal arthrosis. Joint spaces otherwise preserved. Soft tissues unremarkable. IMPRESSION: 1. Suspect an angulated fracture of the base of  the left great toe proximal phalanx, evaluation somewhat limited by hammertoe deformities. Underlying bunion deformity of the great toe, which is substantially increased compared to prior examination dated 10/02/2014, possibly acutely due to fracture. Additional dedicated radiographic views of the great toe may be helpful to more clearly evaluate. 2. No other evident fracture. Electronically Signed   By: Jearld Lesch M.D.   On: 07/20/2021 19:37    Procedures Procedures  {Document cardiac monitor, telemetry assessment procedure when appropriate:1}  Medications Ordered in ED Medications  Tdap (BOOSTRIX) injection 0.5 mL (has no administration in time range)  acetaminophen (TYLENOL) tablet 650 mg (has no administration in time range)    ED Course/ Medical Decision Making/ A&P                           Medical Decision Making Amount and/or Complexity of Data Reviewed External Data Reviewed: notes. Labs: ordered. Decision-making details documented in ED Course. Radiology: ordered. Decision-making details documented in ED Course. ECG/medicine tests:  Decision-making details documented in ED Course.  Risk OTC drugs. Prescription drug management.   87 y.o. female presents to the ED for concern of Fall   This involves an extensive number of treatment options, and is a complaint that carries with it a high risk of complications and morbidity.  The emergent  differential diagnosis prior to evaluation includes, but is not limited to: ***  This is not an exhaustive differential.   Past Medical History / Co-morbidities / Social History: Hx of HTN, hypothyroidism, anemia, Takotsubo cardiomyopathy, demand ischemia, prior STEMI, hyperlipidemia, arthritis, essential tremor, headaches, prior inguinal hernia repair, prior abdominal hysterectomy, hemorrhoids, former tobacco use  Additional History:  Internal and external records from outside source obtained and reviewed including ***  Physical Exam: Physical exam performed. The pertinent findings include: ***  Lab Tests: I ordered, and personally interpreted labs.  The pertinent results include:   CBC: CMP/BMP: Urinalysis:   Imaging Studies: I ordered imaging studies including XR left knee and left foot .  I independently visualized and interpreted said imaging.  Pertinent results include: XR left knee: No acute bony pathology X-ray left foot: Suspect an angulated fracture of the base of the left great toe proximal phalanx, evaluation somewhat limited by hammertoe deformities. Underlying bunion deformity of the great toe, which is substantially increased compared to prior examination dated 10/02/2014, possibly acutely due to fracture. Additional dedicated radiographic views of the great toe may be helpful to more clearly evaluate.  No other evident fracture. I agree with the radiologist interpretation.  I personally ordered and interpreted EKG 12 lead findings, which showed: Overall normal sinus rhythm with possible early R wave progression.  Medications: I ordered medication including Tylenol and Tdap for pain management and tetanus booster.  Reevaluation of the patient after these medicines showed that the patient tolerated these well without complication.  I have reviewed the patients home medicines and have made adjustments as needed  ED Course/Disposition: Pt well-appearing on exam.  Fell 2 to  3 hours ago due to unknown cause, patient suspects this was mechanical.  Denies loss of consciousness or head injury.  Physical exam not suggestive of head injury.  Neuro exam overall unremarkable as described above.  Not suspicious of CVA.  Without shortness of breath or chest pain, not tachycardic, and EKG appears overall unremarkable.  Not suggestive of ACS at this time.  Afebrile with no recent upper respiratory infection.  Patient does not appear hypoxic,  saturation at 98% on room air, communicates without difficulty, and lungs are CTAB.  X-ray imaging suggests possible angulated fracture of the base of the left great toe proximal phalanx.  Physical exam also suggestive of this.  Plan to continue work-up to evaluate for nonmechanical causes.    ***  After consideration of the diagnostic results and the patient's encounter today, I feel that the emergency department workup does not suggest an emergent condition requiring admission or immediate intervention beyond what has been performed at this time.  The patient is safe for discharge and has been instructed to return immediately for worsening symptoms, change in symptoms or any other concerns.  Discussed course of treatment thoroughly with the patient, whom demonstrated understanding.  Patient in agreement and has no further questions.  I discussed this case with my attending physician Dr. Estell Harpin, who agreed with the proposed treatment course and cosigned this note including patient's presenting symptoms, physical exam, and planned diagnostics and interventions.  Attending physician stated agreement with plan or made changes to plan which were implemented.     This chart was dictated using voice recognition software.  Despite best efforts to proofread, errors can occur which can change the documentation meaning.   {Document critical care time when appropriate:1} {Document review of labs and clinical decision tools ie heart score, Chads2Vasc2 etc:1}   {Document your independent review of radiology images, and any outside records:1} {Document your discussion with family members, caretakers, and with consultants:1} {Document social determinants of health affecting pt's care:1} {Document your decision making why or why not admission, treatments were needed:1} Final Clinical Impression(s) / ED Diagnoses Final diagnoses:  None    Rx / DC Orders ED Discharge Orders     None

## 2021-07-20 NOTE — ED Triage Notes (Addendum)
BIB son. Fall today, "not sure why she fell, may have tripped".  denies hitting head, LOC, or blood thinners. C/o L knee and L toe pain. Denies other sx. Alert, NAD, calm, interactive.

## 2021-07-23 ENCOUNTER — Other Ambulatory Visit: Payer: Self-pay | Admitting: Family Medicine

## 2021-07-23 DIAGNOSIS — M7989 Other specified soft tissue disorders: Secondary | ICD-10-CM

## 2021-07-24 ENCOUNTER — Ambulatory Visit
Admission: RE | Admit: 2021-07-24 | Discharge: 2021-07-24 | Disposition: A | Discharge status: Home / Self Care | Payer: Medicare Other | Source: Ambulatory Visit | Attending: Family Medicine | Admitting: Family Medicine

## 2021-07-24 DIAGNOSIS — M7989 Other specified soft tissue disorders: Secondary | ICD-10-CM

## 2021-07-30 ENCOUNTER — Ambulatory Visit: Payer: Medicare Other | Admitting: Podiatry

## 2021-07-30 DIAGNOSIS — M778 Other enthesopathies, not elsewhere classified: Secondary | ICD-10-CM

## 2021-07-30 DIAGNOSIS — M79674 Pain in right toe(s): Secondary | ICD-10-CM | POA: Diagnosis not present

## 2021-07-30 DIAGNOSIS — B351 Tinea unguium: Secondary | ICD-10-CM | POA: Diagnosis not present

## 2021-07-30 DIAGNOSIS — M79675 Pain in left toe(s): Secondary | ICD-10-CM

## 2021-08-11 NOTE — Progress Notes (Signed)
HPI: 87 y.o. female presenting today as a new patient for evaluation of a left toe injury when the patient fell at home on 07/20/2021.  She states that she went to the emergency department which showed fracture of the great toe.  She was referred here to present for follow-up treatment and evaluation  Patient is also requesting a nail trim today.  She states that her nails are thick and elongated very painful in shoe gear.  She is unable to trim her own nails  Past Medical History:  Diagnosis Date   Anemia    Arthritis    Cellulitis    Costochondritis    Headache    Hypothyroidism    Myocardial infarction [facility]) 2012, 2016   both Takotsubo   Pneumonia    Shingles    Takotsubo cardiomyopathy    a. 2012  b. LHC in 09/2014 nl cors, EF 30-35%>>45-50% by echo 11/2014   Tremor, essential     Past Surgical History:  Procedure Laterality Date   ABDOMINAL HYSTERECTOMY     APPLICATION OF A-CELL OF EXTREMITY Left 01/22/2015   Procedure: APPLICATION OF A-CELL TO LEFT THIGH ;  Surgeon: Glenna Fellows, MD;  Location: [facility] OR;  Service: Plastics;  Laterality: Left;   APPLICATION OF WOUND VAC Left 10/09/2014   Procedure: APPLICATION OF WOUND VAC;  Surgeon: Cammy Copa, MD;  Location: [facility] OR;  Service: Orthopedics;  Laterality: Left;   CARDIAC CATHETERIZATION  September 10, 2010   EF 35 to 40%. No significant CAD. ? takotsubo cardiomyopathy   CARDIAC CATHETERIZATION N/A 10/02/2014   Procedure: Left Heart Cath and Coronary Angiography;  Surgeon: Peter M Swaziland, MD;  Location: [facility] INVASIVE CV LAB;  Service: Cardiovascular;  Laterality: N/A;   I & D EXTREMITY Left 10/09/2014   Procedure: DEBRIDEMENT LEFT FOOT;  Surgeon: Cammy Copa, MD;  Location: [facility] OR;  Service: Orthopedics;  Laterality: Left;   INGUINAL HERNIA REPAIR     SKIN FULL THICKNESS GRAFT Left 10/12/2014   Procedure: Debridement left foot, application integra, wound vacuum-assisted closure;  Surgeon: Glenna Fellows, MD;  Location: [facility]  OR;  Service: Plastics;  Laterality: Left;   SKIN GRAFT Left 01/22/2015   left thigh to left foot   SKIN SPLIT GRAFT Left 01/22/2015   Procedure:  SPLIT THICKNESS SKIN GRAFT FROM LEFT THIGH TO LEFT FOOT ;  Surgeon: Glenna Fellows, MD;  Location: [facility] OR;  Service: Plastics;  Laterality: Left;    Allergies  Allergen Reactions   Aspirin     But taking low dose therapy without problems.   Nystatin Itching    She said when she used the cream it made symptoms worse    Penicillins Rash     Physical Exam: General: The patient is alert and oriented x3 in no acute distress.  Dermatology: Skin is warm, dry and supple bilateral lower extremities. Negative for open lesions or macerations.  Hyperkeratotic elongated nails noted 1-5 bilateral  Vascular: Palpable pedal pulses bilaterally. Capillary refill within normal limits.  Negative for any significant edema or erythema  Neurological: Light touch and protective threshold grossly intact  Musculoskeletal Exam: No pedal deformities noted there is some very mild ecchymosis noted to the left great toe.  No significant edema.  Mild pain with palpation as well.  Radiographic Exam 07/20/2021 LT foot:  IMPRESSION: 1. Suspect an angulated fracture of the base of the left great toe proximal phalanx, evaluation somewhat limited by hammertoe deformities. Underlying bunion deformity of the great toe,  which is substantially increased compared to prior examination dated 10/02/2014, possibly acutely due to fracture. Additional dedicated radiographic views of the great toe may be helpful to more clearly evaluate. 2. No other evident fracture.   Assessment: 1.  Injury LT great toe; possible fracture 2.  Pain due to onychomycosis of toenails both   Plan of Care:  1. Patient evaluated. X-Rays reviewed.  2.  Reviewed the x-rays personally and explained to the patient that I do not suspect the patient has a fracture.  If there is a fracture it is  nondisplaced and should heal uneventfully 3.  Continue postsurgical shoe 4.  Mechanical debridement of nails 1-5 bilateral was performed using a nail nipper without incident or bleeding 5.  Return to clinic in 6 weeks for follow-up x-ray      Felecia Shelling, DPM [facility]  Dr. Felecia Shelling, DPM    [address] [place], [place] [address]                Office [phone]  Fax [phone]

## 2021-09-28 ENCOUNTER — Inpatient Hospital Stay (HOSPITAL_COMMUNITY)
Admission: EM | Admit: 2021-09-28 | Discharge: 2021-09-30 | DRG: 389 | Disposition: A | Discharge status: Home / Self Care | Payer: Medicare Other | Attending: Internal Medicine | Admitting: Internal Medicine

## 2021-09-28 ENCOUNTER — Emergency Department (HOSPITAL_COMMUNITY): Payer: Medicare Other

## 2021-09-28 ENCOUNTER — Other Ambulatory Visit: Payer: Self-pay

## 2021-09-28 ENCOUNTER — Encounter (HOSPITAL_COMMUNITY): Payer: Self-pay | Admitting: Emergency Medicine

## 2021-09-28 DIAGNOSIS — N390 Urinary tract infection, site not specified: Secondary | ICD-10-CM | POA: Diagnosis not present

## 2021-09-28 DIAGNOSIS — K567 Ileus, unspecified: Principal | ICD-10-CM

## 2021-09-28 DIAGNOSIS — Z880 Allergy status to penicillin: Secondary | ICD-10-CM | POA: Diagnosis not present

## 2021-09-28 DIAGNOSIS — G250 Essential tremor: Secondary | ICD-10-CM | POA: Diagnosis present

## 2021-09-28 DIAGNOSIS — Z8249 Family history of ischemic heart disease and other diseases of the circulatory system: Secondary | ICD-10-CM

## 2021-09-28 DIAGNOSIS — Z79899 Other long term (current) drug therapy: Secondary | ICD-10-CM

## 2021-09-28 DIAGNOSIS — I5022 Chronic systolic (congestive) heart failure: Secondary | ICD-10-CM | POA: Diagnosis not present

## 2021-09-28 DIAGNOSIS — N3001 Acute cystitis with hematuria: Secondary | ICD-10-CM | POA: Diagnosis not present

## 2021-09-28 DIAGNOSIS — R7401 Elevation of levels of liver transaminase levels: Secondary | ICD-10-CM

## 2021-09-28 DIAGNOSIS — E7800 Pure hypercholesterolemia, unspecified: Secondary | ICD-10-CM | POA: Diagnosis not present

## 2021-09-28 DIAGNOSIS — E038 Other specified hypothyroidism: Secondary | ICD-10-CM

## 2021-09-28 DIAGNOSIS — E039 Hypothyroidism, unspecified: Secondary | ICD-10-CM | POA: Diagnosis present

## 2021-09-28 DIAGNOSIS — D649 Anemia, unspecified: Secondary | ICD-10-CM

## 2021-09-28 DIAGNOSIS — K449 Diaphragmatic hernia without obstruction or gangrene: Secondary | ICD-10-CM | POA: Diagnosis present

## 2021-09-28 DIAGNOSIS — Z87891 Personal history of nicotine dependence: Secondary | ICD-10-CM | POA: Diagnosis not present

## 2021-09-28 DIAGNOSIS — Z90710 Acquired absence of both cervix and uterus: Secondary | ICD-10-CM | POA: Diagnosis not present

## 2021-09-28 DIAGNOSIS — Z79890 Hormone replacement therapy (postmenopausal): Secondary | ICD-10-CM

## 2021-09-28 DIAGNOSIS — E785 Hyperlipidemia, unspecified: Secondary | ICD-10-CM | POA: Diagnosis present

## 2021-09-28 DIAGNOSIS — I10 Essential (primary) hypertension: Secondary | ICD-10-CM

## 2021-09-28 DIAGNOSIS — K219 Gastro-esophageal reflux disease without esophagitis: Secondary | ICD-10-CM | POA: Diagnosis present

## 2021-09-28 DIAGNOSIS — K5900 Constipation, unspecified: Secondary | ICD-10-CM | POA: Diagnosis not present

## 2021-09-28 DIAGNOSIS — I110 Hypertensive heart disease with heart failure: Secondary | ICD-10-CM | POA: Diagnosis present

## 2021-09-28 DIAGNOSIS — H9190 Unspecified hearing loss, unspecified ear: Secondary | ICD-10-CM | POA: Diagnosis present

## 2021-09-28 DIAGNOSIS — E860 Dehydration: Secondary | ICD-10-CM | POA: Diagnosis present

## 2021-09-28 DIAGNOSIS — E871 Hypo-osmolality and hyponatremia: Secondary | ICD-10-CM

## 2021-09-28 DIAGNOSIS — N136 Pyonephrosis: Secondary | ICD-10-CM | POA: Diagnosis present

## 2021-09-28 DIAGNOSIS — I252 Old myocardial infarction: Secondary | ICD-10-CM

## 2021-09-28 DIAGNOSIS — Z886 Allergy status to analgesic agent status: Secondary | ICD-10-CM | POA: Diagnosis not present

## 2021-09-28 DIAGNOSIS — Z888 Allergy status to other drugs, medicaments and biological substances status: Secondary | ICD-10-CM | POA: Diagnosis not present

## 2021-09-28 LAB — URINALYSIS, ROUTINE W REFLEX MICROSCOPIC
Bilirubin Urine: NEGATIVE
Glucose, UA: NEGATIVE mg/dL
Hgb urine dipstick: NEGATIVE
Ketones, ur: NEGATIVE mg/dL
Nitrite: POSITIVE — AB
Protein, ur: NEGATIVE mg/dL
Specific Gravity, Urine: 1.01 (ref 1.005–1.030)
WBC, UA: >50 WBC/hpf — ABNORMAL HIGH (ref 0–5)
pH: 7 (ref 5.0–8.0)

## 2021-09-28 LAB — CBC WITH DIFFERENTIAL/PLATELET
Abs Immature Granulocytes: 0.01 10*3/uL (ref 0.00–0.07)
Basophils Absolute: 0 10*3/uL (ref 0.0–0.1)
Basophils Relative: 1 %
Eosinophils Absolute: 0.2 10*3/uL (ref 0.0–0.5)
Eosinophils Relative: 5 %
HCT: 31.4 % — ABNORMAL LOW (ref 36.0–46.0)
Hemoglobin: 10.2 g/dL — ABNORMAL LOW (ref 12.0–15.0)
Immature Granulocytes: 0 %
Lymphocytes Relative: 28 %
Lymphs Abs: 1.4 10*3/uL (ref 0.7–4.0)
MCH: 31.7 pg (ref 26.0–34.0)
MCHC: 32.5 g/dL (ref 30.0–36.0)
MCV: 97.5 fL (ref 80.0–100.0)
Monocytes Absolute: 0.8 10*3/uL (ref 0.1–1.0)
Monocytes Relative: 15 %
Neutro Abs: 2.7 10*3/uL (ref 1.7–7.7)
Neutrophils Relative %: 51 %
Platelets: 160 10*3/uL (ref 150–400)
RBC: 3.22 MIL/uL — ABNORMAL LOW (ref 3.87–5.11)
RDW: 13.8 % (ref 11.5–15.5)
WBC: 5.2 10*3/uL (ref 4.0–10.5)
nRBC: 0 % (ref 0.0–0.2)

## 2021-09-28 LAB — LIPASE, BLOOD: Lipase: 89 U/L — ABNORMAL HIGH (ref 11–51)

## 2021-09-28 LAB — COMPREHENSIVE METABOLIC PANEL
ALT: 38 U/L (ref 0–44)
AST: 43 U/L — ABNORMAL HIGH (ref 15–41)
Albumin: 3.4 g/dL — ABNORMAL LOW (ref 3.5–5.0)
Alkaline Phosphatase: 84 U/L (ref 38–126)
Anion gap: 3 — ABNORMAL LOW (ref 5–15)
BUN: 9 mg/dL (ref 8–23)
CO2: 29 mmol/L (ref 22–32)
Calcium: 9 mg/dL (ref 8.9–10.3)
Chloride: 97 mmol/L — ABNORMAL LOW (ref 98–111)
Creatinine, Ser: 0.65 mg/dL (ref 0.44–1.00)
GFR, Estimated: >60 mL/min (ref >60)
Glucose, Bld: 108 mg/dL — ABNORMAL HIGH (ref 70–99)
Potassium: 3.8 mmol/L (ref 3.5–5.1)
Sodium: 129 mmol/L — ABNORMAL LOW (ref 135–145)
Total Bilirubin: 0.3 mg/dL (ref 0.3–1.2)
Total Protein: 6.5 g/dL (ref 6.5–8.1)

## 2021-09-28 LAB — POC OCCULT BLOOD, ED: Fecal Occult Bld: NEGATIVE

## 2021-09-28 LAB — TSH: TSH: 7.795 u[IU]/mL — ABNORMAL HIGH (ref 0.350–4.500)

## 2021-09-28 MED ORDER — SODIUM CHLORIDE 0.9 % IV SOLN
1.0000 g | Freq: Once | INTRAVENOUS | Status: AC
  Administered 2021-09-28: 1 g via INTRAVENOUS
  Filled 2021-09-28: qty 10

## 2021-09-28 MED ORDER — ENOXAPARIN SODIUM 40 MG/0.4ML IJ SOSY
40.0000 mg | PREFILLED_SYRINGE | INTRAMUSCULAR | Status: DC
  Administered 2021-09-28 – 2021-09-29 (×2): 40 mg via SUBCUTANEOUS
  Filled 2021-09-28 (×2): qty 0.4

## 2021-09-28 MED ORDER — ONDANSETRON HCL 4 MG/2ML IJ SOLN
4.0000 mg | Freq: Four times a day (QID) | INTRAMUSCULAR | Status: DC | PRN
  Administered 2021-09-30: 4 mg via INTRAVENOUS
  Filled 2021-09-28: qty 2

## 2021-09-28 MED ORDER — ACETAMINOPHEN 650 MG RE SUPP: 650.0000 mg | Freq: Four times a day (QID) | RECTAL | Status: DC | PRN

## 2021-09-28 MED ORDER — SODIUM CHLORIDE 0.9 % IV SOLN: INTRAVENOUS | Status: DC

## 2021-09-28 MED ORDER — SODIUM CHLORIDE 0.9% FLUSH
3.0000 mL | Freq: Two times a day (BID) | INTRAVENOUS | Status: DC
Start: 2021-09-28 — End: 2021-09-30
  Administered 2021-09-28 – 2021-09-30 (×4): 3 mL via INTRAVENOUS

## 2021-09-28 MED ORDER — ACETAMINOPHEN 325 MG PO TABS: 650.0000 mg | ORAL_TABLET | Freq: Four times a day (QID) | ORAL | Status: DC | PRN

## 2021-09-28 MED ORDER — ALBUTEROL SULFATE (2.5 MG/3ML) 0.083% IN NEBU: 2.5000 mg | INHALATION_SOLUTION | Freq: Four times a day (QID) | RESPIRATORY_TRACT | Status: DC | PRN

## 2021-09-28 MED ORDER — FENTANYL CITRATE PF 50 MCG/ML IJ SOSY
50.0000 ug | PREFILLED_SYRINGE | Freq: Once | INTRAMUSCULAR | Status: AC
  Administered 2021-09-28: 50 ug via INTRAVENOUS
  Filled 2021-09-28: qty 1

## 2021-09-28 MED ORDER — IOHEXOL 300 MG/ML  SOLN
100.0000 mL | Freq: Once | INTRAMUSCULAR | Status: AC | PRN
  Administered 2021-09-28: 100 mL via INTRAVENOUS

## 2021-09-28 MED ORDER — SODIUM CHLORIDE 0.9 % IV SOLN
1.0000 g | INTRAVENOUS | Status: DC
Start: 2021-09-29 — End: 2021-09-30
  Administered 2021-09-29: 1 g via INTRAVENOUS
  Filled 2021-09-28: qty 10

## 2021-09-28 MED ORDER — PANTOPRAZOLE SODIUM 40 MG IV SOLR
40.0000 mg | INTRAVENOUS | Status: DC
  Administered 2021-09-28 – 2021-09-29 (×2): 40 mg via INTRAVENOUS
  Filled 2021-09-28 (×2): qty 10

## 2021-09-28 MED ORDER — ONDANSETRON HCL 4 MG PO TABS: 4.0000 mg | ORAL_TABLET | Freq: Four times a day (QID) | ORAL | Status: DC | PRN

## 2021-09-28 NOTE — ED Notes (Signed)
Pts son states he is going home and can be reached at [phone] if any changes in his mother's status occurs.

## 2021-09-28 NOTE — ED Provider Notes (Signed)
[facility] EMERGENCY DEPARTMENT Provider Note   CSN: [id number] Arrival date & time: 09/28/21  1803     History  Chief Complaint  Patient presents with   Abdominal Pain    Charlene Vincent is a 87 y.o. female.  Patient is a 87 year old female who presents with abdominal pain.  She complains of pain in her abdomen that started earlier today.  She thinks maybe was a little bit her yesterday but significantly worse today.  No nausea or vomiting.  No fevers.  No urinary symptoms.  She says normally she takes Metamucil and has about 4 bowel movements per day.  She said she only had 2 bowel movements today and feels like she needs to have a bowel movement.  She says her bowel movements were normal in consistency.  They did appear to be black.       Home Medications Prior to Admission medications   Medication Sig Start Date End Date Taking? Authorizing Provider  acetaminophen (TYLENOL) 500 MG tablet Take 1,000 mg by mouth every 6 (six) hours.    [provider]  atorvastatin (LIPITOR) 80 MG tablet Take 80 mg by mouth daily.    [provider]  carvedilol (COREG) 3.125 MG tablet Take 1 tablet (3.125 mg total) by mouth 2 (two) times daily with a meal. 03/25/20   [place], Peter M, MD  clotrimazole (GYNE-LOTRIMIN) 1 % vaginal cream Place 1 Applicatorful vaginally at bedtime. 02/01/21   Achille Rich, PA-C  doxepin (SINEQUAN) 10 MG capsule Take 10 mg by mouth daily as needed (itching).    [provider]  fluticasone (FLONASE) 50 MCG/ACT nasal spray Place 1-2 sprays into both nostrils daily. 09/02/16   [provider]  levothyroxine (SYNTHROID, LEVOTHROID) 88 MCG tablet Take 88 mcg by mouth daily. 04/08/16   [provider]  lisinopril (ZESTRIL) 2.5 MG tablet Take 1 tablet (2.5 mg total) by mouth daily. 03/25/20   [place], Peter M, MD  oxyCODONE-acetaminophen (PERCOCET/ROXICET) 5-325 MG tablet Take 0.5-1 tablets by mouth as needed for moderate pain.  10/24/20   [provider]      Allergies    Aspirin, Nystatin, and Penicillins    Review of Systems   Review of Systems  Constitutional:  Negative for chills, diaphoresis, fatigue and fever.  HENT:  Negative for congestion, rhinorrhea and sneezing.   Eyes: Negative.   Respiratory:  Negative for cough, chest tightness and shortness of breath.   Cardiovascular:  Negative for chest pain and leg swelling.  Gastrointestinal:  Positive for abdominal pain. Negative for blood in stool, diarrhea, nausea and vomiting.  Genitourinary:  Negative for difficulty urinating, flank pain, frequency and hematuria.  Musculoskeletal:  Negative for arthralgias and back pain.  Skin:  Negative for rash.  Neurological:  Negative for dizziness, speech difficulty, weakness, numbness and headaches.    Physical Exam Updated Vital Signs BP 125/88   Pulse 67   Temp 98.2 F (36.8 C) (Oral)   Resp 10   SpO2 96%  Physical Exam Constitutional:      Appearance: She is well-developed.  HENT:     Head: Normocephalic and atraumatic.  Eyes:     Pupils: Pupils are equal, round, and reactive to light.  Cardiovascular:     Rate and Rhythm: Normal rate and regular rhythm.     Heart sounds: Normal heart sounds.  Pulmonary:     Effort: Pulmonary effort is normal. No respiratory distress.     Breath sounds: Normal breath sounds. No  wheezing or rales.  Chest:     Chest wall: No tenderness.  Abdominal:     General: Bowel sounds are normal.     Palpations: Abdomen is soft.     Tenderness: There is generalized abdominal tenderness. There is no guarding or rebound.  Genitourinary:    Comments: Green/black stool in the rectal vault, no impaction noted Musculoskeletal:        General: Normal range of motion.     Cervical back: Normal range of motion and neck supple.  Lymphadenopathy:     Cervical: No cervical adenopathy.  Skin:    General: Skin is warm and dry.     Findings: No rash.  Neurological:      Mental Status: She is alert and oriented to person, place, and time.     ED Results / Procedures / Treatments   Labs (all labs ordered are listed, but only abnormal results are displayed) Labs Reviewed  COMPREHENSIVE METABOLIC PANEL - Abnormal; Notable for the following components:      Result Value   Sodium 129 (*)    Chloride 97 (*)    Glucose, Bld 108 (*)    Albumin 3.4 (*)    AST 43 (*)    Anion gap 3 (*)    All other components within normal limits  LIPASE, BLOOD - Abnormal; Notable for the following components:   Lipase 89 (*)    All other components within normal limits  CBC WITH DIFFERENTIAL/PLATELET - Abnormal; Notable for the following components:   RBC 3.22 (*)    Hemoglobin 10.2 (*)    HCT 31.4 (*)    All other components within normal limits  URINALYSIS, ROUTINE W REFLEX MICROSCOPIC - Abnormal; Notable for the following components:   APPearance CLOUDY (*)    Nitrite POSITIVE (*)    Leukocytes,Ua LARGE (*)    WBC, UA >50 (*)    Bacteria, UA RARE (*)    All other components within normal limits  URINE CULTURE  POC OCCULT BLOOD, ED    EKG None  Radiology CT Abdomen Pelvis W Contrast  Result Date: 09/28/2021 CLINICAL DATA:  Acute abdominal pain. EXAM: CT ABDOMEN AND PELVIS WITH CONTRAST TECHNIQUE: Multidetector CT imaging of the abdomen and pelvis was performed using the standard protocol following bolus administration of intravenous contrast. RADIATION DOSE REDUCTION: This exam was performed according to the departmental dose-optimization program which includes automated exposure control, adjustment of the mA and/or kV according to patient size and/or use of iterative reconstruction technique. CONTRAST:  OMNIPAQUE IOHEXOL 300 MG/ML  SOLN COMPARISON:  CT 11/07/2020 FINDINGS: Lower chest: Hypoventilatory changes. Moderate-sized hiatal hernia with fluid distension. Hepatobiliary: 12 mm hypodense lesion in the right hepatic dome has peripheral enhancement and is  most consistent with hemangioma. This needs no further follow-up. No additional liver lesion. Gallbladder physiologically distended, no calcified stone. No biliary dilatation. Pancreas: No ductal dilatation or inflammation. Spleen: Normal in size without focal abnormality. Adrenals/Urinary Tract: No adrenal nodule. Right extrarenal pelvis configuration again seen, diminished right hydronephrosis from prior CT. Slight prominence of the left renal collecting system. Homogeneous renal enhancement. Symmetric excretion on delayed phase imaging. No evidence of focal renal lesion. Decreased bladder distension from prior, currently physiologically distended. Stomach/Bowel: Detailed bowel assessment limited in the absence of enteric contrast. Moderate-sized hiatal hernia with fluid in the herniated stomach, no evidence of gastric inflammation. Stable gastric configuration, although the uppermost aspect of the stomach is not included in the field of view. There are  fluid-filled loops of small bowel in the left abdomen and pelvis. No discrete transition point to suggest obstruction. Normal appendix is visualized. Large volume of stool in the proximal colon, small volume of stool distally. There is no colonic inflammation. No abnormal rectal distention. Vascular/Lymphatic: Moderate aortic atherosclerosis. No aneurysm. Patent portal and splenic veins. No bulky abdominopelvic adenopathy. Reproductive: Hysterectomy.  No adnexal mass. Other: No ascites or free air. Small fat containing right inguinal hernia. Musculoskeletal: Chronic L3 compression deformity. Scoliosis and degenerative change in the spine. No acute osseous findings. IMPRESSION: 1. Fluid-filled loops of small bowel in the left abdomen and pelvis without discrete transition point to suggest obstruction. This may be secondary to enteritis or ileus. 2. Moderate-sized hiatal hernia with fluid in the herniated stomach, no evidence of gastric inflammation. 3. Large volume  of stool in the proximal colon, small volume of stool distally. Possible constipation. Aortic Atherosclerosis (ICD10-I70.0). Electronically Signed   By: Narda Rutherford M.D.   On: 09/28/2021 20:59    Procedures Procedures    Medications Ordered in ED Medications  fentaNYL (SUBLIMAZE) injection 50 mcg (50 mcg Intravenous Given 09/28/21 1923)  iohexol (OMNIPAQUE) 300 MG/ML solution 100 mL (100 mLs Intravenous Contrast Given 09/28/21 2026)  cefTRIAXone (ROCEPHIN) 1 g in sodium chloride 0.9 % 100 mL IVPB (1 g Intravenous New Bag/Given 09/28/21 2128)    ED Course/ Medical Decision Making/ A&P                           Medical Decision Making Amount and/or Complexity of Data Reviewed Labs: ordered. Radiology: ordered.  Risk Prescription drug management. Decision regarding hospitalization.   Patient is a 87 year old female who presents with abdominal pain and distention.  Labs show a slightly low hemoglobin similar to prior values on chart review.  Her sodium is slightly low at 129.  Her lipase is mildly elevated.  Her urine is consistent with infection.  She was given IV Rocephin.  Her urine was sent for culture.  Point-of-care stool testing was negative for blood.  She had a CT scan which shows evidence of an ileus without overt obstruction.  There is also large amount of stool approximately in the colon.  I spoke with Dr. Katrinka Blazing who will admit the patient for further treatment.  Final Clinical Impression(s) / ED Diagnoses Final diagnoses:  Ileus (HCC)  Urinary tract infection without hematuria, site unspecified    Rx / DC Orders ED Discharge Orders     None         Rolan Bucco, MD 09/28/21 2220

## 2021-09-28 NOTE — H&P (Addendum)
History and Physical    Patient: Charlene Vincent AOZ:[id number] DOB: 02/23/1935 DOA: 09/28/2021 DOS: the patient was seen and examined on 09/28/2021 PCP: Richmond Campbell., PA-C  Patient coming from: Via private vehicle  Chief Complaint:  Chief Complaint  Patient presents with   Abdominal Pain   HPI: Charlene Vincent is a 87 y.o. female with medical history significant of Takotsubo cardiomyopathy, hypothyroidism, essential tremor who presents with complaints of abdominal pain that acutely worsened today. Patient is not the greatest historian possibly due to her being hard of hearing.  Pain is on the sides of her abdomen.  She normally takes Metamucil and has about 4 bowel movements per day, but has only had 2 bowel movements today.  She denies having any fever, nausea, vomiting, dysuria,  or seen any blood in her urine/stool.  Upon admission into the emergency department patient was seen to be afebrile with pulse 58-71, blood pressures 124/7040 156/76, and all other vital signs maintained.  Labs significant for hemoglobin 10.2, sodium 129, lipase 89, and AST 43.  Urinalysis was positive for large leukocytes, positive nitrites, rare bacteria, 21-50 RBCs/hpf and greater than 50 WBCs.  CT scan of the abdomen pelvis with contrast noted fluid-filled loops of small bowel in the left abdomen and pelvis without discrete transition point concerning for enteritis/ileus, moderate size hiatal hernia and large volume of stool in the proximal colon concerning for constipation.  Patient has been given fentanyl 50 mcg and Rocephin 1 g IV.   Review of Systems: As mentioned in the history of present illness. All other systems reviewed and are negative. Past Medical History:  Diagnosis Date   Anemia    Arthritis    Cellulitis    Costochondritis    Headache    Hypothyroidism    Myocardial infarction [facility]) 2012, 2016   both Takotsubo   Pneumonia    Shingles    Takotsubo cardiomyopathy    a. 2012  b.  LHC in 09/2014 nl cors, EF 30-35%>>45-50% by echo 11/2014   Tremor, essential    Past Surgical History:  Procedure Laterality Date   ABDOMINAL HYSTERECTOMY     APPLICATION OF A-CELL OF EXTREMITY Left 01/22/2015   Procedure: APPLICATION OF A-CELL TO LEFT THIGH ;  Surgeon: Glenna Fellows, MD;  Location: [facility] OR;  Service: Plastics;  Laterality: Left;   APPLICATION OF WOUND VAC Left 10/09/2014   Procedure: APPLICATION OF WOUND VAC;  Surgeon: Cammy Copa, MD;  Location: [facility] OR;  Service: Orthopedics;  Laterality: Left;   CARDIAC CATHETERIZATION  September 10, 2010   EF 35 to 40%. No significant CAD. ? takotsubo cardiomyopathy   CARDIAC CATHETERIZATION N/A 10/02/2014   Procedure: Left Heart Cath and Coronary Angiography;  Surgeon: Peter M Swaziland, MD;  Location: [facility] INVASIVE CV LAB;  Service: Cardiovascular;  Laterality: N/A;   I & D EXTREMITY Left 10/09/2014   Procedure: DEBRIDEMENT LEFT FOOT;  Surgeon: Cammy Copa, MD;  Location: [facility] OR;  Service: Orthopedics;  Laterality: Left;   INGUINAL HERNIA REPAIR     SKIN FULL THICKNESS GRAFT Left 10/12/2014   Procedure: Debridement left foot, application integra, wound vacuum-assisted closure;  Surgeon: Glenna Fellows, MD;  Location: [facility] OR;  Service: Plastics;  Laterality: Left;   SKIN GRAFT Left 01/22/2015   left thigh to left foot   SKIN SPLIT GRAFT Left 01/22/2015   Procedure:  SPLIT THICKNESS SKIN GRAFT FROM LEFT THIGH TO LEFT FOOT ;  Surgeon: Glenna Fellows, MD;  Location[facility]  OR;  Service: Clinical cytogeneticist;  Laterality: Left;   Social History:  reports that she quit smoking about 11 years ago. Her smoking use included cigarettes. She smoked an average of .5 packs per day. She has never used smokeless tobacco. She reports that she does not drink alcohol and does not use drugs.  Allergies  Allergen Reactions   Aspirin     But taking low dose therapy without problems.   Nystatin Itching    She said when she used the cream it made symptoms worse     Penicillins Rash    Family History  Problem Relation Age of Onset   Heart disease Father    Heart disease Brother     Prior to Admission medications   Medication Sig Start Date End Date Taking? Authorizing Provider  acetaminophen (TYLENOL) 500 MG tablet Take 1,000 mg by mouth every 6 (six) hours.    [provider]  atorvastatin (LIPITOR) 80 MG tablet Take 80 mg by mouth daily.    [provider]  carvedilol (COREG) 3.125 MG tablet Take 1 tablet (3.125 mg total) by mouth 2 (two) times daily with a meal. 03/25/20   [place], Peter M, MD  clotrimazole (GYNE-LOTRIMIN) 1 % vaginal cream Place 1 Applicatorful vaginally at bedtime. 02/01/21   Sherrell Puller, PA-C  doxepin (SINEQUAN) 10 MG capsule Take 10 mg by mouth daily as needed (itching).    [provider]  fluticasone (FLONASE) 50 MCG/ACT nasal spray Place 1-2 sprays into both nostrils daily. 09/02/16   [provider]  levothyroxine (SYNTHROID, LEVOTHROID) 88 MCG tablet Take 88 mcg by mouth daily. 04/08/16   [provider]  lisinopril (ZESTRIL) 2.5 MG tablet Take 1 tablet (2.5 mg total) by mouth daily. 03/25/20   [place], Peter M, MD  oxyCODONE-acetaminophen (PERCOCET/ROXICET) 5-325 MG tablet Take 0.5-1 tablets by mouth as needed for moderate pain. 10/24/20   [provider]    Physical Exam: Vitals:   09/28/21 1900 09/28/21 2000 09/28/21 2052 09/28/21 2130  BP: 124/74 139/74 (!) 156/67 125/88  Pulse: 63 (!) 58 71 67  Resp: 13 15 14 10   Temp:      TempSrc:      SpO2: 96% 95% 95% 96%   Exam  Constitutional: Early female who appears to be in no acute distress. Eyes: PERRL, lids and conjunctivae normal ENMT: Mucous membranes are moist.  Hard of hearing. Neck: normal, supple.  No JVD. Respiratory: clear to auscultation bilaterally, no wheezing, no crackles. Normal respiratory effort.  Cardiovascular: Regular rate and rhythm, no murmurs / rubs / gallops. No extremity edema.   Abdomen:  no tenderness, no masses palpated. Bowel sounds positive.  Musculoskeletal: no clubbing / cyanosis.  Arthritic changes of joints of the upper and lower extremities. Skin: Poor skin turgor.  Abrasion noted on the lateral aspect of the right shin and laceration healing of the left leg with bruising present. Neurologic: CN 2-12 grossly intact.  Able to move all extremities.  Tremor present. Psychiatric: Patient is alert oriented to person and place.  Normal mood  Data Reviewed:  Reviewed labs and imaging as noted above in HPI  Assessment and Plan: Ileus constipation Acute.  Patient presents with complaints of abdominal pain.  CT imaging noted fluid-filled loops of small bowel in the left abdomen and pelvis without discrete transition point concerning for enteritis or ileus and had large volume of stool in the proximal colon small volume in the distal colon concerning for constipation.  She is on  oxycodone which could be a contributing factor as well as has a history of hypothyroidism. -Admit to medical telemetry bed -Monitor intake and output -N.p.o. except for meds.  Consider advancing diet when medically appropriate. -Normal saline IV fluids at 75 mL/h for total of 1 L -Continue oxycodone as needed for pain  Urinary tract infection Patient was found to have large leukocytes, positive nitrites, rare bacteria, 21-50 RBCs/hpf, and greater than 50 WBCs.  Urine culture was ordered and patient started on empiric antibiotics of Rocephin. -Follow-up urine culture -Continue empiric antibiotics of Rocephin  Hyponatremia Chronic.  On admission sodium 129, but appears to be chronically low in the mid 120-130s. -Check urine sodium and serum osmolarity -Continue IV fluids -Recheck sodium levels in a.m.  Normocytic anemia Acute.  On admission hemoglobin 10.2 g/dL.  Hemoglobin was 12.1 on 5/28. -Check stool guaiac(negative) -Recheck CBC tomorrow morning  Essential hypertension Patient's blood  pressure elevated up to 156/67.  Home blood pressure medication regimen includes Coreg 3.125 mg twice daily and lisinopril 2.5 mg daily. -Continue home blood pressure regimen as tolerated  Heart failure with reduced EF Chronic last echocardiogram from 11/2014 EF to be 45 to 50% with grade 1 diastolic dysfunction.  Patient appears to be euvolemic at this time. -Daily weights -Continue beta-blocker  Elevated AST Acute.  AST elevated to 43, but has previously been elevated similarly in the past. -Continue to monitor  Hypothyroidism Home medication regimen includes levothyroxine 88 mcg daily. -Add-on TSH -Continue levothyroxine.  Consider need of adjustment of dosage based off TSH.  Hyperlipidemia Home regimen includes atorvastatin 80 mg daily. -Continue statin and monitoring of LFTs  Hiatal hernia -Protonix 40 mg IV daily  DVT prophylaxis: Lovenox Advance Care Planning:   Code Status: Full Code   Consults: None  Family Communication: Attempted to call patient's son, but no answer at this time.  Severity of Illness: The appropriate patient status for this patient is INPATIENT. Inpatient status is judged to be reasonable and necessary in order to provide the required intensity of service to ensure the patient's safety. The patient's presenting symptoms, physical exam findings, and initial radiographic and laboratory data in the context of their chronic comorbidities is felt to place them at high risk for further clinical deterioration. Furthermore, it is not anticipated that the patient will be medically stable for discharge from the hospital within 2 midnights of admission.   * I certify that at the point of admission it is my clinical judgment that the patient will require inpatient hospital care spanning beyond 2 midnights from the point of admission due to high intensity of service, high risk for further deterioration and high frequency of surveillance required.*  Author: Clydie Braun, MD 09/28/2021 10:10 PM  For on call review [email or web address].

## 2021-09-28 NOTE — ED Notes (Signed)
Pt ambulated to restroom with standby assist to provide urine specimen.

## 2021-09-28 NOTE — ED Notes (Signed)
ED Provider at bedside. 

## 2021-09-28 NOTE — ED Notes (Signed)
Hospitalist at bedside 

## 2021-09-28 NOTE — H&P (Incomplete)
History and Physical    Patient: Charlene Vincent [id number] DOB: 1934-03-04 DOA: 09/28/2021 DOS: the patient was seen and examined on 09/28/2021 PCP: Aletha Halim., PA-C  Patient coming from: Via private vehicle  Chief Complaint:  Chief Complaint  Patient presents with  . Abdominal Pain   HPI: Charlene Vincent is a 87 y.o. female with medical history significant of Takotsubo cardiomyopathy, hypothyroidism, essential tremor who presents with complaints of abdominal pain that acutely worsened today. Patient is not the greatest historian possibly due to her being hard of hearing.  Pain is on the sides of her abdomen.  She normally takes Metamucil and has about 4 bowel movements per day, but has only had 2 bowel movements today.  She denies having any fever, nausea, vomiting, dysuria,  or seen any blood in her urine/stool.  Upon admission into the emergency department patient was seen to be afebrile with pulse 58-71, blood pressures 124/7040 156/76, and all other vital signs maintained.  Labs significant for hemoglobin 10.2, sodium 129, lipase 89, and AST 43.  Urinalysis was positive for large leukocytes, positive nitrites, rare bacteria, 21-50 RBCs/hpf and greater than 50 WBCs.  CT scan of the abdomen pelvis with contrast noted fluid-filled loops of small bowel in the left abdomen and pelvis without discrete transition point concerning for enteritis/ileus, moderate size hiatal hernia and large volume of stool in the proximal colon concerning for constipation.  Patient has been given fentanyl 50 mcg and Rocephin 1 g IV.   Review of Systems: As mentioned in the history of present illness. All other systems reviewed and are negative. Past Medical History:  Diagnosis Date  . Anemia   . Arthritis   . Cellulitis   . Costochondritis   . Headache   . Hypothyroidism   . Myocardial infarction [facility]) 2012, 2016   both Takotsubo  . Pneumonia   . Shingles   . Takotsubo cardiomyopathy    a.  2012  b. LHC in 09/2014 nl cors, EF 30-35%>>45-50% by echo 11/2014  . Tremor, essential    Past Surgical History:  Procedure Laterality Date  . ABDOMINAL HYSTERECTOMY    . APPLICATION OF A-CELL OF EXTREMITY Left 01/22/2015   Procedure: APPLICATION OF A-CELL TO LEFT THIGH ;  Surgeon: Irene Limbo, MD;  Location: [place];  Service: Plastics;  Laterality: Left;  . APPLICATION OF WOUND VAC Left 10/09/2014   Procedure: APPLICATION OF WOUND VAC;  Surgeon: Meredith Pel, MD;  Location: [place];  Service: Orthopedics;  Laterality: Left;  . CARDIAC CATHETERIZATION  September 10, 2010   EF 35 to 40%. No significant CAD. ? takotsubo cardiomyopathy  . CARDIAC CATHETERIZATION N/A 10/02/2014   Procedure: Left Heart Cath and Coronary Angiography;  Surgeon: Peter M Martinique, MD;  Location: [place] CV LAB;  Service: Cardiovascular;  Laterality: N/A;  . I & D EXTREMITY Left 10/09/2014   Procedure: DEBRIDEMENT LEFT FOOT;  Surgeon: Meredith Pel, MD;  Location: [place];  Service: Orthopedics;  Laterality: Left;  . INGUINAL HERNIA REPAIR    . SKIN FULL THICKNESS GRAFT Left 10/12/2014   Procedure: Debridement left foot, application integra, wound vacuum-assisted closure;  Surgeon: Irene Limbo, MD;  Location: [place];  Service: Plastics;  Laterality: Left;  . SKIN GRAFT Left 01/22/2015   left thigh to left foot  . SKIN SPLIT GRAFT Left 01/22/2015   Procedure:  SPLIT THICKNESS SKIN GRAFT FROM LEFT THIGH TO LEFT FOOT ;  Surgeon: Irene Limbo, MD;  Location: [facility]  OR;  Service: Government social research officer;  Laterality: Left;   Social History:  reports that she quit smoking about 11 years ago. Her smoking use included cigarettes. She smoked an average of .5 packs per day. She has never used smokeless tobacco. She reports that she does not drink alcohol and does not use drugs.  Allergies  Allergen Reactions  . Aspirin     But taking low dose therapy without problems.  . Nystatin Itching    She said when she used the cream it  made symptoms worse   . Penicillins Rash    Family History  Problem Relation Age of Onset  . Heart disease Father   . Heart disease Brother     Prior to Admission medications   Medication Sig Start Date End Date Taking? Authorizing Provider  acetaminophen (TYLENOL) 500 MG tablet Take 1,000 mg by mouth every 6 (six) hours.    [provider]  atorvastatin (LIPITOR) 80 MG tablet Take 80 mg by mouth daily.    [provider]  carvedilol (COREG) 3.125 MG tablet Take 1 tablet (3.125 mg total) by mouth 2 (two) times daily with a meal. 03/25/20   [place], Peter M, MD  clotrimazole (GYNE-LOTRIMIN) 1 % vaginal cream Place 1 Applicatorful vaginally at bedtime. 02/01/21   Achille Rich, PA-C  doxepin (SINEQUAN) 10 MG capsule Take 10 mg by mouth daily as needed (itching).    [provider]  fluticasone (FLONASE) 50 MCG/ACT nasal spray Place 1-2 sprays into both nostrils daily. 09/02/16   [provider]  levothyroxine (SYNTHROID, LEVOTHROID) 88 MCG tablet Take 88 mcg by mouth daily. 04/08/16   [provider]  lisinopril (ZESTRIL) 2.5 MG tablet Take 1 tablet (2.5 mg total) by mouth daily. 03/25/20   [place], Peter M, MD  oxyCODONE-acetaminophen (PERCOCET/ROXICET) 5-325 MG tablet Take 0.5-1 tablets by mouth as needed for moderate pain. 10/24/20   [provider]    Physical Exam: Vitals:   09/28/21 1900 09/28/21 2000 09/28/21 2052 09/28/21 2130  BP: 124/74 139/74 (!) 156/67 125/88  Pulse: 63 (!) 58 71 67  Resp: 13 15 14 10   Temp:      TempSrc:      SpO2: 96% 95% 95% 96%   Exam  Constitutional: Early female who appears to be in no acute distress. Eyes: PERRL, lids and conjunctivae normal ENMT: Mucous membranes are moist.  Hard of hearing. Neck: normal, supple.  No JVD. Respiratory: clear to auscultation bilaterally, no wheezing, no crackles. Normal respiratory effort.  Cardiovascular: Regular rate and rhythm, no murmurs / rubs / gallops. No  extremity edema.   Abdomen: no tenderness, no masses palpated. Bowel sounds positive.  Musculoskeletal: no clubbing / cyanosis.  Arthritic changes of joints of the upper and lower extremities. Skin: Abrasion noted on the lateral aspect of the right shin and laceration healing of the left leg with bruising present. Neurologic: CN 2-12 grossly intact.  Able to move all extremities.  Tremor present. Psychiatric: Patient is alert oriented to person and place.  Normal mood  Data Reviewed: {Tip this will not be part of the note when signed- Document your independent interpretation of telemetry tracing, EKG, lab, Radiology test or any other diagnostic tests. Add any new diagnostic test ordered today. (Optional):26781} {Results:26384}  Assessment and Plan: Ileus Acute.  Patient presents with complaints of abdominal pain.  CT imaging noted fluid-filled loops of small bowel in the left abdomen and pelvis without discrete transition point concerning for enteritis or ileus.   -  Admit to medical telemetry bed -Monitor intake and output -N.p.o. except for meds -Normal saline IV fluids at 75 mL/h -Continue oxycodone as needed for pain  Urinary tract infection Patient was found to have large leukocytes, positive nitrites, rare bacteria, 21-50 RBCs/hpf, and greater than 50 WBCs.  Urine culture was ordered and patient started on empiric antibiotics of Rocephin. -Follow-up urine culture -Continue empiric antibiotics of Rocephin  Hyponatremia Chronic.  On admission sodium 129, but appears to be chronically low in the mid 120-130s. -Check urine sodium and serum osmolarity -Continue IV fluids -Recheck sodium levels in a.m.  Normocytic anemia Acute.  On admission hemoglobin 10.2 g/dL.  Hemoglobin was 12.1 on 5/28. -Check stool guaiac(negative) -Recheck CBC tomorrow morning  Essential hypertension Patient's blood pressure elevated up to 156/67.  Home blood pressure medication regimen includes Coreg  3.125 mg twice daily and lisinopril 2.5 mg daily. -Continue home blood pressure regimen as tolerated  Heart failure with reduced EF Last echocardiogram from 11/2014 EF to be 45 to 50% with grade 1 diastolic dysfunction.  Patient appears to be euvolemic at this time. -Daily weights  Elevated AST Acute.  AST elevated to 43, but has previously been elevated similarly in the past. -Continue to monitor  Hypothyroidism Home medication regimen includes levothyroxine 88 mcg daily. -Add-on TSH -Continue levothyroxine.  Consider need of adjustment of dosage based off TSH.  Hyperlipidemia Home regimen includes atorvastatin 80 mg daily. -Continue statin and monitoring of LFTs  Hiatal hernia -Protonix 40 mg IV daily  DVT prophylaxis: Lovenox Advance Care Planning:   Code Status: Full Code   Consults: None  Family Communication: ***  Severity of Illness: The appropriate patient status for this patient is INPATIENT. Inpatient status is judged to be reasonable and necessary in order to provide the required intensity of service to ensure the patient's safety. The patient's presenting symptoms, physical exam findings, and initial radiographic and laboratory data in the context of their chronic comorbidities is felt to place them at high risk for further clinical deterioration. Furthermore, it is not anticipated that the patient will be medically stable for discharge from the hospital within 2 midnights of admission.   * I certify that at the point of admission it is my clinical judgment that the patient will require inpatient hospital care spanning beyond 2 midnights from the point of admission due to high intensity of service, high risk for further deterioration and high frequency of surveillance required.*  Author: Clydie Braun, MD 09/28/2021 10:10 PM  For on call review [email or web address].

## 2021-09-28 NOTE — ED Triage Notes (Signed)
Pt reports abdominal pain that started today. Pt reports "I am all backed up I haven't pooped recently." Pt reports last BM was at 1700 today.

## 2021-09-29 DIAGNOSIS — K449 Diaphragmatic hernia without obstruction or gangrene: Secondary | ICD-10-CM

## 2021-09-29 DIAGNOSIS — N390 Urinary tract infection, site not specified: Secondary | ICD-10-CM | POA: Diagnosis present

## 2021-09-29 DIAGNOSIS — R7401 Elevation of levels of liver transaminase levels: Secondary | ICD-10-CM | POA: Diagnosis not present

## 2021-09-29 DIAGNOSIS — I5022 Chronic systolic (congestive) heart failure: Secondary | ICD-10-CM | POA: Diagnosis present

## 2021-09-29 DIAGNOSIS — K5900 Constipation, unspecified: Secondary | ICD-10-CM | POA: Diagnosis present

## 2021-09-29 DIAGNOSIS — K567 Ileus, unspecified: Secondary | ICD-10-CM | POA: Diagnosis not present

## 2021-09-29 LAB — BASIC METABOLIC PANEL
Anion gap: 6 (ref 5–15)
BUN: 8 mg/dL (ref 8–23)
CO2: 28 mmol/L (ref 22–32)
Calcium: 9 mg/dL (ref 8.9–10.3)
Chloride: 99 mmol/L (ref 98–111)
Creatinine, Ser: 0.7 mg/dL (ref 0.44–1.00)
GFR, Estimated: >60 mL/min (ref >60)
Glucose, Bld: 93 mg/dL (ref 70–99)
Potassium: 4 mmol/L (ref 3.5–5.1)
Sodium: 133 mmol/L — ABNORMAL LOW (ref 135–145)

## 2021-09-29 LAB — OSMOLALITY: Osmolality: 278 mOsm/kg (ref 275–295)

## 2021-09-29 LAB — CBC
HCT: 33.5 % — ABNORMAL LOW (ref 36.0–46.0)
Hemoglobin: 10.8 g/dL — ABNORMAL LOW (ref 12.0–15.0)
MCH: 31.3 pg (ref 26.0–34.0)
MCHC: 32.2 g/dL (ref 30.0–36.0)
MCV: 97.1 fL (ref 80.0–100.0)
Platelets: 167 10*3/uL (ref 150–400)
RBC: 3.45 MIL/uL — ABNORMAL LOW (ref 3.87–5.11)
RDW: 13.9 % (ref 11.5–15.5)
WBC: 5.9 10*3/uL (ref 4.0–10.5)
nRBC: 0 % (ref 0.0–0.2)

## 2021-09-29 LAB — SODIUM, URINE, RANDOM: Sodium, Ur: 76 mmol/L

## 2021-09-29 MED ORDER — OXYCODONE-ACETAMINOPHEN 5-325 MG PO TABS
0.5000 | ORAL_TABLET | ORAL | Status: DC | PRN
  Administered 2021-09-29 – 2021-09-30 (×2): 1 via ORAL
  Filled 2021-09-29 (×3): qty 1

## 2021-09-29 MED ORDER — SODIUM CHLORIDE 0.9 % IV SOLN: INTRAVENOUS | Status: AC

## 2021-09-29 MED ORDER — CARVEDILOL 3.125 MG PO TABS
3.1250 mg | ORAL_TABLET | Freq: Two times a day (BID) | ORAL | Status: DC
  Administered 2021-09-29 – 2021-09-30 (×2): 3.125 mg via ORAL
  Filled 2021-09-29 (×3): qty 1

## 2021-09-29 MED ORDER — BISACODYL 10 MG RE SUPP
10.0000 mg | Freq: Once | RECTAL | Status: AC
Start: 2021-09-29 — End: 2021-09-29
  Administered 2021-09-29: 10 mg via RECTAL
  Filled 2021-09-29: qty 1

## 2021-09-29 MED ORDER — DOCUSATE SODIUM 100 MG PO CAPS
100.0000 mg | ORAL_CAPSULE | Freq: Two times a day (BID) | ORAL | Status: DC
  Administered 2021-09-29 – 2021-09-30 (×3): 100 mg via ORAL
  Filled 2021-09-29 (×3): qty 1

## 2021-09-29 MED ORDER — POLYETHYLENE GLYCOL 3350 17 G PO PACK
17.0000 g | PACK | Freq: Every day | ORAL | Status: DC
  Administered 2021-09-29 – 2021-09-30 (×2): 17 g via ORAL
  Filled 2021-09-29 (×2): qty 1

## 2021-09-29 MED ORDER — LISINOPRIL 5 MG PO TABS
2.5000 mg | ORAL_TABLET | Freq: Every day | ORAL | Status: DC
  Administered 2021-09-29 – 2021-09-30 (×2): 2.5 mg via ORAL
  Filled 2021-09-29 (×2): qty 1

## 2021-09-29 MED ORDER — LEVOTHYROXINE SODIUM 88 MCG PO TABS
88.0000 ug | ORAL_TABLET | Freq: Every day | ORAL | Status: DC
  Administered 2021-09-29: 88 ug via ORAL
  Filled 2021-09-29: qty 1

## 2021-09-29 MED ORDER — ATORVASTATIN CALCIUM 40 MG PO TABS
80.0000 mg | ORAL_TABLET | Freq: Every day | ORAL | Status: DC
  Administered 2021-09-29 – 2021-09-30 (×2): 80 mg via ORAL
  Filled 2021-09-29 (×2): qty 2

## 2021-09-29 MED ORDER — LEVOTHYROXINE SODIUM 112 MCG PO TABS
112.0000 ug | ORAL_TABLET | Freq: Every day | ORAL | Status: DC
  Administered 2021-09-30: 112 ug via ORAL
  Filled 2021-09-29: qty 1

## 2021-09-29 MED ORDER — DOCUSATE SODIUM 50 MG PO CAPS
50.0000 mg | ORAL_CAPSULE | Freq: Two times a day (BID) | ORAL | Status: DC
  Filled 2021-09-29 (×5): qty 1

## 2021-09-29 NOTE — Assessment & Plan Note (Signed)
Continue statin 

## 2021-09-29 NOTE — Assessment & Plan Note (Signed)
-  In the setting of decreased oral intake and dehydration -Continue to maintain adequate hydration -sodium 133 at discharge -synthroid dose also Adjusted.

## 2021-09-29 NOTE — Assessment & Plan Note (Signed)
-   Continue PPI and lifestyle modification. -Patient reports no nausea or vomiting currently.

## 2021-09-29 NOTE — Progress Notes (Signed)
Progress Note   Patient: Charlene Vincent QVZ:[id number] DOB: 05/25/1934 DOA: 09/28/2021     1 DOS: the patient was seen and examined on 09/29/2021   Brief hospital admission course: As per H&P written by Dr. Katrinka Blazing on a [address] Silos is a 87 y.o. female with medical history significant of Takotsubo cardiomyopathy, hypothyroidism, essential tremor who presents with complaints of abdominal pain that acutely worsened today. Patient is not the greatest historian possibly due to her being hard of hearing.  Pain is on the sides of her abdomen.  She normally takes Metamucil and has about 4 bowel movements per day, but has only had 2 bowel movements today.  She denies having any fever, nausea, vomiting, dysuria,  or seen any blood in her urine/stool.   Upon admission into the emergency department patient was seen to be afebrile with pulse 58-71, blood pressures 124/7040 156/76, and all other vital signs maintained.  Labs significant for hemoglobin 10.2, sodium 129, lipase 89, and AST 43.  Urinalysis was positive for large leukocytes, positive nitrites, rare bacteria, 21-50 RBCs/hpf and greater than 50 WBCs.  CT scan of the abdomen pelvis with contrast noted fluid-filled loops of small bowel in the left abdomen and pelvis without discrete transition point concerning for enteritis/ileus, moderate size hiatal hernia and large volume of stool in the proximal colon concerning for constipation.  Patient has been given fentanyl 50 mcg and Rocephin 1 g IV.  Assessment and Plan: Charlene Vincent is a 87 y.o. female with medical history significant of Takotsubo cardiomyopathy, hypothyroidism, essential tremor who presents with complaints of abdominal pain that acutely worsened today. Patient is not the greatest historian possibly due to her being hard of hearing.  Pain is on the sides of her abdomen.  She normally takes Metamucil and has about 4 bowel movements per day, but has only had 2 bowel movements today.   She denies having any fever, nausea, vomiting, dysuria,  or seen any blood in her urine/stool.   Upon admission into the emergency department patient was seen to be afebrile with pulse 58-71, blood pressures 124/7040 156/76, and all other vital signs maintained.  Labs significant for hemoglobin 10.2, sodium 129, lipase 89, and AST 43.  Urinalysis was positive for large leukocytes, positive nitrites, rare bacteria, 21-50 RBCs/hpf and greater than 50 WBCs.  CT scan of the abdomen pelvis with contrast noted fluid-filled loops of small bowel in the left abdomen and pelvis without discrete transition point concerning for enteritis/ileus, moderate size hiatal hernia and large volume of stool in the proximal colon concerning for constipation.  Patient has been given fentanyl 50 mcg and Rocephin 1 g IV.   Subjective:  Currently afebrile; in no acute distress.  Reporting feeling better and having some increasing appetite. -No chest pain, no nausea, no vomiting.  Physical Exam: Vitals:   09/29/21 0328 09/29/21 0500 09/29/21 0741 09/29/21 1434  BP: 120/64  (!) 140/68 (!) 124/59  Pulse: 77  (!) 54 66  Resp: 18  18 17   Temp: 98.4 F (36.9 C)  98.2 F (36.8 C) 98.3 F (36.8 C)  TempSrc:   Oral   SpO2: 98%  99% 98%  Weight:  47.7 kg    Height:       General exam: Alert, awake, oriented x 3; no chest pain, no nausea or vomiting.  Express that she is passing gas but still no bowel movement.  Feel with increased appetite.  Currently no complaining of dysuria. Respiratory system:  Clear to auscultation. Respiratory effort normal.  Good saturation on room air; no using accessory muscles. Cardiovascular system:RRR. No rubs or gallops; no JVD. Gastrointestinal system: Abdomen is nondistended, soft and nontender on palpation. No organomegaly or masses felt. Normal bowel sounds heard. Central nervous system: Alert and oriented. No focal neurological deficits. Extremities: No cyanosis, clubbing or edema. Skin: No  petechiae. Psychiatry: Judgement and insight appear normal. Mood & affect appropriate.   Data Reviewed: CBC: WBCs 5.9, hemoglobin 10.8 and platelet count 167 K Basic metabolic panel: Sodium 133, potassium 4.0, chloride 99, bicarb 28, BUN 8, creatinine 0.70.  Anion gap 6.  Family Communication: No family at bedside.  Disposition: Status is: Inpatient Remains inpatient appropriate because: Continue treatment for ileus; advance diet as tolerated.  Hopefully discharge in the next 24 hours.   Planned Discharge Destination: Home   Author: Vassie Loll, MD 09/29/2021 6:35 PM  For on call review [email or web address].

## 2021-09-29 NOTE — Evaluation (Signed)
Physical Therapy Evaluation Patient Details Name: Charlene Vincent MRN: [id number] DOB: 04-28-1934 Today's Date: 09/29/2021  History of Present Illness  Charlene Vincent is a 87 y.o. female with medical history significant of Takotsubo cardiomyopathy, hypothyroidism, essential tremor who presents with complaints of abdominal pain that acutely worsened today. Patient is not the greatest historian possibly due to her being hard of hearing.  Pain is on the sides of her abdomen.  She normally takes Metamucil and has about 4 bowel movements per day, but has only had 2 bowel movements today.  She denies having any fever, nausea, vomiting, dysuria,  or seen any blood in her urine/stool.   Clinical Impression  Patient functioning near baseline for functional mobility and gait with good return for bed mobility, transfers and ambulation in room/hallway without loss of balance.  Patient encouraged to ambulate with nursing staff supervising as tolerated for length of stay.  Plan:  Patient discharged from physical therapy to care of nursing for ambulation daily as tolerated for length of stay.         Recommendations for follow up therapy are one component of a multi-disciplinary discharge planning process, led by the attending physician.  Recommendations may be updated based on patient status, additional functional criteria and insurance authorization.  Follow Up Recommendations No PT follow up      Assistance Recommended at Discharge Set up Supervision/Assistance  Patient can return home with the following  Help with stairs or ramp for entrance;Assistance with cooking/housework    Equipment Recommendations None recommended by PT  Recommendations for Other Services       Functional Status Assessment Patient has not had a recent decline in their functional status     Precautions / Restrictions Precautions Precautions: Fall Restrictions Weight Bearing Restrictions: No      Mobility  Bed  Mobility Overal bed mobility: Modified Independent                  Transfers Overall transfer level: Modified independent                      Ambulation/Gait Ambulation/Gait assistance: Modified independent (Device/Increase time) Gait Distance (Feet): 100 Feet Assistive device: Rolling walker (2 wheels) Gait Pattern/deviations: Decreased step length - right, Decreased step length - left, Decreased stride length Gait velocity: decreased     General Gait Details: slightly labored cadence with good return for ambulating in room and hallway without loss of balance, limited mostly due to fatigue  Stairs            Wheelchair Mobility    Modified Rankin (Stroke Patients Only)       Balance Overall balance assessment: Needs assistance Sitting-balance support: Feet supported, No upper extremity supported Sitting balance-Leahy Scale: Good Sitting balance - Comments: seated at EOB   Standing balance support: During functional activity, No upper extremity supported Standing balance-Leahy Scale: Poor Standing balance comment: fair/poor without AD, fair/good using RW                             Pertinent Vitals/Pain Pain Assessment Pain Assessment: No/denies pain    Home Living Family/patient expects to be discharged to:: Private residence Living Arrangements: Children Available Help at Discharge: Family;Available 24 hours/day Type of Home: Mobile home Home Access: Stairs to enter Entrance Stairs-Rails: Right;Left;Can reach both Entrance Stairs-Number of Steps: 5   Home Layout: One level Home Equipment: Agricultural consultant (2 wheels);Cane -  single point;Wheelchair - manual;Grab bars - tub/shower;BSC/3in1      Prior Function Prior Level of Function : Independent/Modified Independent             Mobility Comments: household ambulator using RW ADLs Comments: assisted by family     Hand Dominance        Extremity/Trunk Assessment    Upper Extremity Assessment Upper Extremity Assessment: Overall WFL for tasks assessed    Lower Extremity Assessment Lower Extremity Assessment: Generalized weakness    Cervical / Trunk Assessment Cervical / Trunk Assessment: Normal  Communication   Communication: HOH  Cognition Arousal/Alertness: Awake/alert Behavior During Therapy: WFL for tasks assessed/performed Overall Cognitive Status: Within Functional Limits for tasks assessed                                          General Comments      Exercises     Assessment/Plan    PT Assessment Patient does not need any further PT services  PT Problem List         PT Treatment Interventions      PT Goals (Current goals can be found in the Care Plan section)  Acute Rehab PT Goals Patient Stated Goal: return home with family to assist PT Goal Formulation: With patient Time For Goal Achievement: 09/29/21 Potential to Achieve Goals: Good    Frequency       Co-evaluation               AM-PAC PT "6 Clicks" Mobility  Outcome Measure Help needed turning from your back to your side while in a flat bed without using bedrails?: None Help needed moving from lying on your back to sitting on the side of a flat bed without using bedrails?: None Help needed moving to and from a bed to a chair (including a wheelchair)?: None Help needed standing up from a chair using your arms (e.g., wheelchair or bedside chair)?: None Help needed to walk in hospital room?: A Little Help needed climbing 3-5 steps with a railing? : A Little 6 Click Score: 22    End of Session   Activity Tolerance: Patient tolerated treatment well;Patient limited by fatigue Patient left: in chair;with call bell/phone within reach Nurse Communication: Mobility status PT Visit Diagnosis: Unsteadiness on feet (R26.81);Other abnormalities of gait and mobility (R26.89);Muscle weakness (generalized) (M62.81)    Time: 1062-6948 PT Time  Calculation (min) (ACUTE ONLY): 16 min   Charges:   PT Evaluation $PT Eval Low Complexity: 1 Low PT Treatments $Therapeutic Activity: 8-22 mins        1:47 PM, 09/29/21 Ocie Bob, MPT Physical Therapist with [facility] [phone] office [phone] mobile phone

## 2021-09-29 NOTE — Assessment & Plan Note (Signed)
-  No signs of overt bleeding appreciated -Continue to follow hemoglobin trend. 

## 2021-09-29 NOTE — Progress Notes (Signed)
Initial Nutrition Assessment  DOCUMENTATION CODES:      INTERVENTION:  Prune juice with meals   Ensure Enlive po BID   NUTRITION DIAGNOSIS:   Inadequate oral intake related to decreased appetite as evidenced by per patient/family report.  GOAL:  Patient will meet greater than or equal to 90% of their needs  MONITOR:  Diet advancement, Supplement acceptance, Labs, PO intake  REASON FOR ASSESSMENT:   Malnutrition Screening Tool    ASSESSMENT: Patient is an 87 yo female with hx of CHF, takotsubo cardiomyopathy, anemia, MI, tremor and hard of hearing. Presents with abdominal pain- ileus, constipation and UTI.   CT- of abdomen/pelvis -moderate hiatal hernia, large stool volume (proximal colon).  Patient reports bowels moved yesterday and diet is advancing. At home she has been eating a regular diet  which includes lots of fruits and vegetables, salmon, chicken and a daily nutrition shake. Beverages with meals: ginger ale and Gatorade. Independent with feeding.   Weights reviewed.   Medications: colace BID, miralax, lipitor,   IVF-NS@ 75 ml/hr      Latest Ref Rng & Units 09/29/2021    4:04 AM 09/28/2021    6:46 PM 07/20/2021    8:54 PM  BMP  Glucose 70 - 99 mg/dL 93  301  601   BUN 8 - 23 mg/dL 8  9  12    Creatinine 0.44 - 1.00 mg/dL  0.93  2.35   Sodium 135 - 145 mmol/L 133  129  126   Potassium 3.5 - 5.1 mmol/L 4.0  3.8  3.8   Chloride 98 - 111 mmol/L 99  97  90   CO2 22 - 32 mmol/L 28  29  29    Calcium 8.9 - 10.3 mg/dL 9.0  9.0  9.8      NUTRITION - FOCUSED PHYSICAL EXAM:  Flowsheet Row Most Recent Value  Orbital Region Mild depletion  Buccal Region Moderate depletion  Temple Region Moderate depletion  Clavicle Bone Region Mild depletion  Clavicle and Acromion Bone Region Moderate depletion  Dorsal Hand Mild depletion  Edema (RD Assessment) None  Hair Reviewed  Eyes Reviewed  Mouth Reviewed  Skin Reviewed  Nails Reviewed       Diet Order:   Diet  Order             Diet full liquid Room service appropriate? Yes; Fluid consistency: Thin  Diet effective now                   EDUCATION NEEDS:  Education needs have been addressed  Skin:  Skin Assessment: Reviewed RN Assessment  Last BM:  8/6  Height:   Ht Readings from Last 1 Encounters:  09/28/21 5\' 2"  (1.575 m)    Weight:   Wt Readings from Last 1 Encounters:  09/29/21 47.7 kg    Ideal Body Weight:   50 kg  BMI:  Body mass index is 19.23 kg/m.  Estimated Nutritional Needs:   Kcal:  1400-1600  Protein:  68-75 gr  Fluid:  >1400 ml daily  11/28/21 MS,RD,CSG,LDN Contact: 

## 2021-09-29 NOTE — Evaluation (Signed)
Clinical/Bedside Swallow Evaluation Patient Details  Name: Charlene Vincent MRN: [id number] Date of Birth: 14-May-1934  Today's Date: 09/29/2021 Time: SLP Start Time (ACUTE ONLY): 1432 SLP Stop Time (ACUTE ONLY): 1455 SLP Time Calculation (min) (ACUTE ONLY): 23 min  Past Medical History:  Past Medical History:  Diagnosis Date   Anemia    Arthritis    Cellulitis    Costochondritis    Headache    Hypothyroidism    Myocardial infarction (HCC) 2012, 2016   both Takotsubo   Pneumonia    Shingles    Takotsubo cardiomyopathy    a. 2012  b. LHC in 09/2014 nl cors, EF 30-35%>>45-50% by echo 11/2014   Tremor, essential    Past Surgical History:  Past Surgical History:  Procedure Laterality Date   ABDOMINAL HYSTERECTOMY     APPLICATION OF A-CELL OF EXTREMITY Left 01/22/2015   Procedure: APPLICATION OF A-CELL TO LEFT THIGH ;  Surgeon: Glenna Fellows, MD;  Location: [facility] OR;  Service: Plastics;  Laterality: Left;   APPLICATION OF WOUND VAC Left 10/09/2014   Procedure: APPLICATION OF WOUND VAC;  Surgeon: Cammy Copa, MD;  Location: [facility] OR;  Service: Orthopedics;  Laterality: Left;   CARDIAC CATHETERIZATION  September 10, 2010   EF 35 to 40%. No significant CAD. ? takotsubo cardiomyopathy   CARDIAC CATHETERIZATION N/A 10/02/2014   Procedure: Left Heart Cath and Coronary Angiography;  Surgeon: Peter M Swaziland, MD;  Location: [facility] INVASIVE CV LAB;  Service: Cardiovascular;  Laterality: N/A;   I & D EXTREMITY Left 10/09/2014   Procedure: DEBRIDEMENT LEFT FOOT;  Surgeon: Cammy Copa, MD;  Location: [facility] OR;  Service: Orthopedics;  Laterality: Left;   INGUINAL HERNIA REPAIR     SKIN FULL THICKNESS GRAFT Left 10/12/2014   Procedure: Debridement left foot, application integra, wound vacuum-assisted closure;  Surgeon: Glenna Fellows, MD;  Location: [facility] OR;  Service: Plastics;  Laterality: Left;   SKIN GRAFT Left 01/22/2015   left thigh to left foot   SKIN SPLIT GRAFT Left 01/22/2015   Procedure:   SPLIT THICKNESS SKIN GRAFT FROM LEFT THIGH TO LEFT FOOT ;  Surgeon: Glenna Fellows, MD;  Location: [facility] OR;  Service: Plastics;  Laterality: Left;   HPI:  Charlene Vincent is a 87 y.o. female with medical history significant of Takotsubo cardiomyopathy, hypothyroidism, essential tremor who presents with complaints of abdominal pain that acutely worsened today. Patient is not the greatest historian possibly due to her being hard of hearing.  Pain is on the sides of her abdomen.  She normally takes Metamucil and has about 4 bowel movements per day, but has only had 2 bowel movements today. CT scan of the abdomen pelvis with contrast noted fluid-filled loops of small bowel in the left abdomen and pelvis without discrete transition point concerning for enteritis/ileus, moderate size hiatal hernia and large volume of stool in the proximal colon concerning for constipation. BSE requested.    Assessment / Plan / Recommendation  Clinical Impression  Clincial swallow evaluation completed with Pt while seated upright in her chair. Pt had her purse in her lap and was eating a KitKat bar upon arrival and also stored her chewing gum in her mouth. Oral motor exam is WNL. Pt assessed with ice chips, water via cup/straw, puree, and regular textures. Pt without overt signs or symptoms of aspiration and no reports of globus with textures and consistencies presented. Recommend regular textures and thin liquids with standard aspiration and reflux precautions, pills whole with  water, and intermittent supervsion with meals while in hospital setting. Above to RN. SLP will sign off. Reconsult if needs arise. SLP Visit Diagnosis: Dysphagia, unspecified (R13.10)    Aspiration Risk  No limitations    Diet Recommendation Regular;Thin liquid   Liquid Administration via: Cup;Straw Medication Administration: Whole meds with liquid Supervision: Patient able to self feed;Intermittent supervision to cue for compensatory  strategies Postural Changes: Seated upright at 90 degrees;Remain upright for at least 30 minutes after po intake    Other  Recommendations Oral Care Recommendations: Oral care BID Other Recommendations: Clarify dietary restrictions    Recommendations for follow up therapy are one component of a multi-disciplinary discharge planning process, led by the attending physician.  Recommendations may be updated based on patient status, additional functional criteria and insurance authorization.  Follow up Recommendations No SLP follow up      Assistance Recommended at Discharge Intermittent Supervision/Assistance  Functional Status Assessment Patient has not had a recent decline in their functional status  Frequency and Duration            Prognosis Prognosis for Safe Diet Advancement: Good Barriers to Reach Goals: Cognitive deficits      Swallow Study   General Date of Onset: 09/28/21 HPI: Charlene Vincent is a 87 y.o. female with medical history significant of Takotsubo cardiomyopathy, hypothyroidism, essential tremor who presents with complaints of abdominal pain that acutely worsened today. Patient is not the greatest historian possibly due to her being hard of hearing.  Pain is on the sides of her abdomen.  She normally takes Metamucil and has about 4 bowel movements per day, but has only had 2 bowel movements today. CT scan of the abdomen pelvis with contrast noted fluid-filled loops of small bowel in the left abdomen and pelvis without discrete transition point concerning for enteritis/ileus, moderate size hiatal hernia and large volume of stool in the proximal colon concerning for constipation. BSE requested. Type of Study: Bedside Swallow Evaluation Previous Swallow Assessment: N/A Diet Prior to this Study:  (full liquids) Temperature Spikes Noted: No Respiratory Status: Room air History of Recent Intubation: No Behavior/Cognition: Alert;Cooperative;Pleasant mood Oral Cavity  Assessment: Within Functional Limits Oral Care Completed by SLP: Yes Oral Cavity - Dentition: Dentures, top;Dentures, bottom Vision: Functional for self-feeding Self-Feeding Abilities: Able to feed self Patient Positioning: Upright in chair Baseline Vocal Quality: Normal Volitional Cough: Strong Volitional Swallow: Able to elicit    Oral/Motor/Sensory Function Overall Oral Motor/Sensory Function: Within functional limits   Ice Chips Ice chips: Within functional limits Presentation: Spoon   Thin Liquid Thin Liquid: Within functional limits Presentation: Straw;Cup;Self Fed    Nectar Thick Nectar Thick Liquid: Not tested   Honey Thick Honey Thick Liquid: Not tested   Puree Puree: Within functional limits Presentation: Spoon   Solid     Solid: Within functional limits Presentation: Self Fed     Thank you,  Havery Moros, CCC-SLP [phone]  Jamaia Brum 09/29/2021,4:09 PM

## 2021-09-29 NOTE — Assessment & Plan Note (Signed)
-   Final cultures pending at discharge; excellent response to the use of ceftriaxone -No fever, no nausea, no vomiting, no dysuria. -Transition to oral cefdinir to complete antibiotic therapy -Patient advised to maintain adequate hydration.

## 2021-09-29 NOTE — Assessment & Plan Note (Addendum)
-   Stable overall ?-Continue home antihypertensive regimen. ?-Heart healthy diet discussed with patient. ?

## 2021-09-29 NOTE — Assessment & Plan Note (Signed)
-  TSH 7.795 -Continue Synthroid at adjusted dose for better control of her hypothyroidism problem.

## 2021-09-29 NOTE — TOC Progression Note (Signed)
  Transition of Care [facility]) Screening Note   Patient Details  Name: Charlene Vincent Date of Birth: Jan 21, 1935   [facility] CM/SW Contact:    Leitha Bleak, RN Phone Number: 09/29/2021, 1:28 PM  Advancing diet today - PT - no follow-up  Transition of Care Department (TOC) has reviewed patient and no TOC needs have been identified at this time. We will continue to monitor patient advancement through interdisciplinary progression rounds. If new patient transition needs arise, please place a TOC consult.     Expected Discharge Plan: Home/Self Care Barriers to Discharge: Continued Medical Work up  Expected Discharge Plan and Services Expected Discharge Plan: Home/Self Care       Living arrangements for the past 2 months: Single Family Home

## 2021-09-29 NOTE — Assessment & Plan Note (Signed)
-   Ileus/constipation appreciated on CT abdomen and pelvis examination. -Patient will be started on Colace, MiraLAX and Dulcolax suppository -Increase physical activity -Maintain adequate hydration -Follow clinical response and advance diet as tolerated.

## 2021-09-29 NOTE — Assessment & Plan Note (Addendum)
-  Appears to be currently compensated -Follow daily weights, adequate hydration and heart healthy/low-sodium diet. -Patient denies orthopnea and there was no lower extremity edema appreciated on exam.

## 2021-09-29 NOTE — Assessment & Plan Note (Signed)
-  Probably in the setting of dehydration and acute infection -Will recommend to follow LFTs trend -Minimize hepatotoxic agents. -No red flag symptoms or history appreciated.

## 2021-09-29 NOTE — Assessment & Plan Note (Signed)
-   Chronic decompensated; Stool burden appreciated on CT abdomen -Bowel regimen has been adjusted to the use of Colace twice a day and daily MiraLAX -Patient advised to maintain adequate hydration, to increase mobility and to increase fiber intake. -Synthroid dose has also been adjusted.

## 2021-09-30 DIAGNOSIS — K567 Ileus, unspecified: Secondary | ICD-10-CM | POA: Diagnosis not present

## 2021-09-30 DIAGNOSIS — I5022 Chronic systolic (congestive) heart failure: Secondary | ICD-10-CM | POA: Diagnosis not present

## 2021-09-30 DIAGNOSIS — R7401 Elevation of levels of liver transaminase levels: Secondary | ICD-10-CM | POA: Diagnosis not present

## 2021-09-30 DIAGNOSIS — K5900 Constipation, unspecified: Secondary | ICD-10-CM | POA: Diagnosis not present

## 2021-09-30 LAB — BASIC METABOLIC PANEL
Anion gap: 7 (ref 5–15)
BUN: 10 mg/dL (ref 8–23)
CO2: 26 mmol/L (ref 22–32)
Calcium: 8.9 mg/dL (ref 8.9–10.3)
Chloride: 100 mmol/L (ref 98–111)
Creatinine, Ser: 0.74 mg/dL (ref 0.44–1.00)
GFR, Estimated: >60 mL/min (ref >60)
Glucose, Bld: 95 mg/dL (ref 70–99)
Potassium: 3.7 mmol/L (ref 3.5–5.1)
Sodium: 133 mmol/L — ABNORMAL LOW (ref 135–145)

## 2021-09-30 MED ORDER — PANTOPRAZOLE SODIUM 20 MG PO TBEC: 20.0000 mg | DELAYED_RELEASE_TABLET | Freq: Every day | ORAL | 1 refills | Status: AC

## 2021-09-30 MED ORDER — LEVOTHYROXINE SODIUM 112 MCG PO TABS: 112.0000 ug | ORAL_TABLET | Freq: Every day | ORAL | 1 refills | Status: AC

## 2021-09-30 MED ORDER — POLYETHYLENE GLYCOL 3350 17 G PO PACK: 17.0000 g | PACK | Freq: Every day | ORAL | 2 refills | Status: AC

## 2021-09-30 MED ORDER — CEFDINIR 300 MG PO CAPS: 300.0000 mg | ORAL_CAPSULE | Freq: Two times a day (BID) | ORAL | 0 refills | Status: AC

## 2021-09-30 MED ORDER — DOCUSATE SODIUM 100 MG PO CAPS: 100.0000 mg | ORAL_CAPSULE | Freq: Two times a day (BID) | ORAL | 1 refills | Status: AC

## 2021-09-30 NOTE — Evaluation (Signed)
Occupational Therapy Evaluation Patient Details Name: EULENE PEKAR MRN: [id number] DOB: 29-Dec-1934 Today's Date: 09/30/2021   History of Present Illness ROBBYE DEDE is a 87 y.o. female with medical history significant of Takotsubo cardiomyopathy, hypothyroidism, essential tremor who presents with complaints of abdominal pain that acutely worsened today. Patient is not the greatest historian possibly due to her being hard of hearing.  Pain is on the sides of her abdomen.  She normally takes Metamucil and has about 4 bowel movements per day, but has only had 2 bowel movements today.  She denies having any fever, nausea, vomiting, dysuria,  or seen any blood in her urine/stool.   Clinical Impression   Pt admitted for concerns listed above. PTA pt reported that she was independent with all ADL's and functional mobility, stating that she walks around her home 5x a day. At this time, pt presents with short term memory deficits, decreased safety awareness, and balance deficits. She is requiring min guard for safety with functional mobility and most standing ADL's. Recommending Tub Bench and BSC for pt safety at home, as she is a fall risk with poor balance and has been having difficulty safely completing shower transfers at home. OT recommending HHOT upon discharge, no further acute OT needs.       Recommendations for follow up therapy are one component of a multi-disciplinary discharge planning process, led by the attending physician.  Recommendations may be updated based on patient status, additional functional criteria and insurance authorization.   Follow Up Recommendations  Home health OT    Assistance Recommended at Discharge Set up Supervision/Assistance  Patient can return home with the following A little help with walking and/or transfers;A little help with bathing/dressing/bathroom;Assistance with cooking/housework;Direct supervision/assist for medications management    Functional  Status Assessment  Patient has had a recent decline in their functional status and demonstrates the ability to make significant improvements in function in a reasonable and predictable amount of time.  Equipment Recommendations  Tub/shower bench;BSC/3in1    Recommendations for Other Services       Precautions / Restrictions Precautions Precautions: Fall Restrictions Weight Bearing Restrictions: No      Mobility Bed Mobility               General bed mobility comments: Up in recliner    Transfers Overall transfer level: Needs assistance Equipment used: None Transfers: Sit to/from Stand Sit to Stand: Min guard           General transfer comment: Min guard due to balance      Balance Overall balance assessment: Needs assistance Sitting-balance support: Feet supported, No upper extremity supported Sitting balance-Leahy Scale: Good     Standing balance support: During functional activity, No upper extremity supported Standing balance-Leahy Scale: Poor Standing balance comment: fair/poor without AD, fair/good using RW                           ADL either performed or assessed with clinical judgement   ADL Overall ADL's : At baseline                                       General ADL Comments: Pt able to complete all ADL's with minimal assist or less. Bathing has become hard due to difficulty getting into and out of the tub, as well as maintaining standing balance in  shower. Has been sponge bathing on toilet lately.     Vision Baseline Vision/History: 1 Wears glasses Ability to See in Adequate Light: 0 Adequate Patient Visual Report: No change from baseline Vision Assessment?: No apparent visual deficits     Perception     Praxis      Pertinent Vitals/Pain Pain Assessment Pain Assessment: No/denies pain     Hand Dominance Right   Extremity/Trunk Assessment Upper Extremity Assessment Upper Extremity Assessment: Overall  WFL for tasks assessed   Lower Extremity Assessment Lower Extremity Assessment: Defer to PT evaluation   Cervical / Trunk Assessment Cervical / Trunk Assessment: Normal   Communication Communication Communication: HOH   Cognition Arousal/Alertness: Awake/alert Behavior During Therapy: WFL for tasks assessed/performed Overall Cognitive Status: Impaired/Different from baseline Area of Impairment: Memory, Safety/judgement                     Memory: Decreased short-term memory   Safety/Judgement: Decreased awareness of safety, Decreased awareness of deficits     General Comments: Patient asked to take off hospital gown and put on shirt x3, OT stating each time that they needed to wait until IV and heart monitor were off. Pt reported understanding each time. At times impulsive most likely baseline with decreased awareness of safety     General Comments  VSS on RA    Exercises     Shoulder Instructions      Home Living Family/patient expects to be discharged to:: Private residence Living Arrangements: Children Available Help at Discharge: Family;Available 24 hours/day Type of Home: Mobile home Home Access: Stairs to enter Entrance Stairs-Number of Steps: 5 Entrance Stairs-Rails: Right;Left;Can reach both Home Layout: One level     Bathroom Shower/Tub: Chief Strategy Officer: Standard Bathroom Accessibility: Yes How Accessible: Accessible via walker Home Equipment: Rolling Walker (2 wheels);Cane - single point;Wheelchair - manual;Grab bars - tub/shower;BSC/3in1          Prior Functioning/Environment Prior Level of Function : Independent/Modified Independent             Mobility Comments: household ambulator using RW ADLs Comments: assisted by family        OT Problem List: Decreased strength;Decreased activity tolerance;Impaired balance (sitting and/or standing);Decreased cognition;Decreased safety awareness      OT  Treatment/Interventions:      OT Goals(Current goals can be found in the care plan section) Acute Rehab OT Goals Patient Stated Goal: To go home OT Goal Formulation: With patient/family Time For Goal Achievement: 09/30/21 Potential to Achieve Goals: Good  OT Frequency:      Co-evaluation              AM-PAC OT "6 Clicks" Daily Activity     Outcome Measure Help from another person eating meals?: None Help from another person taking care of personal grooming?: None Help from another person toileting, which includes using toliet, bedpan, or urinal?: None Help from another person bathing (including washing, rinsing, drying)?: A Little Help from another person to put on and taking off regular upper body clothing?: None Help from another person to put on and taking off regular lower body clothing?: A Little 6 Click Score: 22   End of Session Nurse Communication: Mobility status  Activity Tolerance: Patient tolerated treatment well Patient left: in chair;with call bell/phone within reach;with chair alarm set;with family/visitor present  OT Visit Diagnosis: Unsteadiness on feet (R26.81);Other abnormalities of gait and mobility (R26.89);Muscle weakness (generalized) (M62.81)  Time: 0076-2263 OT Time Calculation (min): 12 min Charges:  OT General Charges $OT Visit: 1 Visit OT Evaluation $OT Eval Moderate Complexity: 1 Mod  Najib Colmenares H., OTR/L Acute Rehabilitation  Jahnyla Parrillo Elane Bing Plume 09/30/2021, 10:37 AM

## 2021-09-30 NOTE — TOC Transition Note (Signed)
Transition of Care [facility]) - CM/SW Discharge Note   Patient Details  Name: Charlene Vincent MRN: [id number] Date of Birth: 1934/03/10  [facility]) CM/SW Contact:  Leitha Bleak, RN Phone Number: 09/30/2021, 1:51 PM   Clinical Narrative:   Patient is requesting a 3N1 at discharge. Orders placed and referred to [facility] with [employer] to be dropped shipped.   Final next level of care: Home/Self Care Barriers to Discharge: Barriers Resolved      Patient and family notified of of transfer: 09/30/21

## 2021-09-30 NOTE — Discharge Summary (Signed)
Physician Discharge Summary   Patient: Charlene Vincent MRN: [id number] DOB: [birth year]  Admit date:     09/28/2021  Discharge date: 09/30/21  Discharge Physician: Vassie Loll   PCP: Richmond Campbell., PA-C   Recommendations at discharge:  Repeat basic metabolic panel to follow electrolytes and renal function Repeat thyroid panel in 6 weeks and further adjust Synthroid as needed Reassess blood pressure and adjust antihypertensive regimen as required Follow final culture results from her urine and make any adjustment to antibiotics if needed based on sensitivity.  Discharge Diagnoses: Principal Problem:   Ileus (HCC) Active Problems:   Constipation   Urinary tract infection   Hyponatremia   Anemia   HTN (hypertension)   Chronic HFrEF (heart failure with reduced ejection fraction) (HCC)   Elevated AST (SGOT)   Hypothyroidism   Hyperlipidemia   Hiatal hernia  Brief hospital admission course: As per H&P written by Dr. Katrinka Blazing on a [address] Charlene Vincent is a 87 y.o. female with medical history significant of Takotsubo cardiomyopathy, hypothyroidism, essential tremor who presents with complaints of abdominal pain that acutely worsened today. Patient is not the greatest historian possibly due to her being hard of hearing.  Pain is on the sides of her abdomen.  She normally takes Metamucil and has about 4 bowel movements per day, but has only had 2 bowel movements today.  She denies having any fever, nausea, vomiting, dysuria,  or seen any blood in her urine/stool.   Upon admission into the emergency department patient was seen to be afebrile with pulse 58-71, blood pressures 124/7040 156/76, and all other vital signs maintained.  Labs significant for hemoglobin 10.2, sodium 129, lipase 89, and AST 43.  Urinalysis was positive for large leukocytes, positive nitrites, rare bacteria, 21-50 RBCs/hpf and greater than 50 WBCs.  CT scan of the abdomen pelvis with contrast noted fluid-filled  loops of small bowel in the left abdomen and pelvis without discrete transition point concerning for enteritis/ileus, moderate size hiatal hernia and large volume of stool in the proximal colon concerning for constipation.  Patient has been given fentanyl 50 mcg and Rocephin 1 g IV.  Assessment and Plan: * Ileus (HCC) - Ileus/constipation appreciated on CT abdomen and pelvis examination. -Most likely in the setting of chronic use of pain medication and hypothyroidism. -Patient with great response to the use of Colace and MiraLAX.   -Patient reported multiple good bowel movements with bowel regimen.  Advised to maintain adequate hydration and to take medications as prescribed. -Continue to increase physical activity -Tolerating diet without problems at discharge.  Constipation - Chronic decompensated; Stool burden appreciated on CT abdomen -Bowel regimen has been adjusted to the use of Colace twice a day and daily MiraLAX -Patient advised to maintain adequate hydration, to increase mobility and to increase fiber intake. -Synthroid dose has also been adjusted.    Urinary tract infection - Final cultures pending at discharge; excellent response to the use of ceftriaxone -No fever, no nausea, no vomiting, no dysuria. -Transition to oral cefdinir to complete antibiotic therapy -Patient advised to maintain adequate hydration.  Hyponatremia -In the setting of decreased oral intake and dehydration -Continue to maintain adequate hydration -sodium 133 at discharge -synthroid dose also Adjusted.  Anemia -No signs of overt bleeding appreciated -Continue to follow hemoglobin trend.  HTN (hypertension) -Stable overall -Continue home antihypertensive regimen -Heart healthy diet discussed with patient.  Chronic HFrEF (heart failure with reduced ejection fraction) (HCC) -Appears to be currently compensated -Follow daily weights,  adequate hydration and heart healthy/low-sodium diet. -Patient  denies orthopnea and there was no lower extremity edema appreciated on exam.  Elevated AST (SGOT) -Probably in the setting of dehydration and acute infection -Will recommend to follow LFTs trend -Minimize hepatotoxic agents. -No red flag symptoms or history appreciated.  Hypothyroidism -TSH 7.795 -Continue Synthroid at adjusted dose for better control of her hypothyroidism problem.  Hiatal hernia - Continue PPI and lifestyle modification. -Patient reports no nausea or vomiting currently.  Hyperlipidemia -Continue statin. -Heart healthy diet discussed with patient.  GERD/hiatal hernia -Patient discharged on PPI.  Consultants: None Procedures performed: See below for x-ray reports. Disposition: Home Diet recommendation: Heart healthy diet.  DISCHARGE MEDICATION: Allergies as of 09/30/2021       Reactions   Aspirin    But taking low dose therapy without problems.   Nystatin Itching   She said when she used the cream it made symptoms worse    Penicillins Rash        Medication List     TAKE these medications    acetaminophen 500 MG tablet Commonly known as: TYLENOL Take 1,000 mg by mouth every 6 (six) hours.   atorvastatin 80 MG tablet Commonly known as: LIPITOR Take 80 mg by mouth daily.   carvedilol 3.125 MG tablet Commonly known as: COREG Take 1 tablet (3.125 mg total) by mouth 2 (two) times daily with a meal.   cefdinir 300 MG capsule Commonly known as: OMNICEF Take 1 capsule (300 mg total) by mouth 2 (two) times daily for 4 days.   clotrimazole 1 % vaginal cream Commonly known as: GYNE-LOTRIMIN Place 1 Applicatorful vaginally at bedtime.   docusate sodium 100 MG capsule Commonly known as: COLACE Take 1 capsule (100 mg total) by mouth 2 (two) times daily.   ferrous sulfate 325 (65 FE) MG EC tablet Take 1 tablet by mouth 2 (two) times daily.   fluticasone 50 MCG/ACT nasal spray Commonly known as: FLONASE Place 1-2 sprays into both nostrils  daily.   levothyroxine 112 MCG tablet Commonly known as: SYNTHROID Take 1 tablet (112 mcg total) by mouth daily. Start taking on: October 01, 2021 What changed:  medication strength how much to take   lisinopril 2.5 MG tablet Commonly known as: ZESTRIL Take 1 tablet (2.5 mg total) by mouth daily.   mupirocin ointment 2 % Commonly known as: BACTROBAN Apply 1 Application topically 3 (three) times daily.   oxyCODONE-acetaminophen 5-325 MG tablet Commonly known as: PERCOCET/ROXICET Take 0.5-1 tablets by mouth as needed for moderate pain.   pantoprazole 20 MG tablet Commonly known as: Protonix Take 1 tablet (20 mg total) by mouth daily.   polyethylene glycol 17 g packet Commonly known as: MIRALAX / GLYCOLAX Take 17 g by mouth daily. Start taking on: October 01, 2021        Follow-up Information     Richmond Campbell., PA-C. Schedule an appointment as soon as possible for a visit in 10 day(s).   Specialty: Family Medicine Contact information: [address] [place] [address] [phone]                Discharge Exam: Ceasar Mons Weights   09/28/21 2325 09/29/21 0500 09/30/21 0419  Weight: 47.7 kg 47.7 kg 47.6 kg   General exam: Alert, awake, oriented x 3; no chest pain, no nausea, no vomiting, no shortness of breath.  Reports multiple good bowel movements overnight and is tolerating diet without any problems.  Denies dysuria and feeling ready to  go home. Respiratory system: Clear to auscultation. Respiratory effort normal.  Good saturation on room air; no using accessory muscles. Cardiovascular system:RRR. No rubs or gallops; no JVD. Gastrointestinal system: Abdomen is nondistended, soft and nontender on palpation. No organomegaly or masses felt. Normal bowel sounds heard. Central nervous system: Alert and oriented. No focal neurological deficits. Extremities: No cyanosis, clubbing or edema. Skin: No petechiae. Psychiatry: Judgement and insight appear normal.  Mood & affect appropriate.    Condition at discharge: Stable and improved.  The results of significant diagnostics from this hospitalization (including imaging, microbiology, ancillary and laboratory) are listed below for reference.   Imaging Studies: CT Abdomen Pelvis W Contrast  Result Date: 09/28/2021 CLINICAL DATA:  Acute abdominal pain. EXAM: CT ABDOMEN AND PELVIS WITH CONTRAST TECHNIQUE: Multidetector CT imaging of the abdomen and pelvis was performed using the standard protocol following bolus administration of intravenous contrast. RADIATION DOSE REDUCTION: This exam was performed according to the departmental dose-optimization program which includes automated exposure control, adjustment of the mA and/or kV according to patient size and/or use of iterative reconstruction technique. CONTRAST:  OMNIPAQUE IOHEXOL 300 MG/ML  SOLN COMPARISON:  CT 11/07/2020 FINDINGS: Lower chest: Hypoventilatory changes. Moderate-sized hiatal hernia with fluid distension. Hepatobiliary: 12 mm hypodense lesion in the right hepatic dome has peripheral enhancement and is most consistent with hemangioma. This needs no further follow-up. No additional liver lesion. Gallbladder physiologically distended, no calcified stone. No biliary dilatation. Pancreas: No ductal dilatation or inflammation. Spleen: Normal in size without focal abnormality. Adrenals/Urinary Tract: No adrenal nodule. Right extrarenal pelvis configuration again seen, diminished right hydronephrosis from prior CT. Slight prominence of the left renal collecting system. Homogeneous renal enhancement. Symmetric excretion on delayed phase imaging. No evidence of focal renal lesion. Decreased bladder distension from prior, currently physiologically distended. Stomach/Bowel: Detailed bowel assessment limited in the absence of enteric contrast. Moderate-sized hiatal hernia with fluid in the herniated stomach, no evidence of gastric inflammation. Stable gastric  configuration, although the uppermost aspect of the stomach is not included in the field of view. There are fluid-filled loops of small bowel in the left abdomen and pelvis. No discrete transition point to suggest obstruction. Normal appendix is visualized. Large volume of stool in the proximal colon, small volume of stool distally. There is no colonic inflammation. No abnormal rectal distention. Vascular/Lymphatic: Moderate aortic atherosclerosis. No aneurysm. Patent portal and splenic veins. No bulky abdominopelvic adenopathy. Reproductive: Hysterectomy.  No adnexal mass. Other: No ascites or free air. Small fat containing right inguinal hernia. Musculoskeletal: Chronic L3 compression deformity. Scoliosis and degenerative change in the spine. No acute osseous findings. IMPRESSION: 1. Fluid-filled loops of small bowel in the left abdomen and pelvis without discrete transition point to suggest obstruction. This may be secondary to enteritis or ileus. 2. Moderate-sized hiatal hernia with fluid in the herniated stomach, no evidence of gastric inflammation. 3. Large volume of stool in the proximal colon, small volume of stool distally. Possible constipation. Aortic Atherosclerosis (ICD10-I70.0). Electronically Signed   By: Narda Rutherford M.D.   On: 09/28/2021 20:59    Microbiology: Results for orders placed or performed during the hospital encounter of 09/28/21  Urine Culture     Status: Abnormal (Preliminary result)   Collection Time: 09/28/21  6:37 PM   Specimen: Urine, Clean Catch  Result Value Ref Range Status   Specimen Description   Final    URINE, CLEAN CATCH Performed at [facility], [address] [place], [place] [address]    Special Requests  Final    NONE Performed at [facility], [address] [place], [place] [address]    Culture (A)  Final    >=100,000 COLONIES/mL CITROBACTER KOSERI SUSCEPTIBILITIES TO FOLLOW Performed at [facility], [address], [place], [place]  [address]    Report Status PENDING  Incomplete    Labs: CBC: Recent Labs  Lab 09/28/21 1846 09/29/21 0404  WBC 5.2 5.9  NEUTROABS 2.7  --   HGB 10.2* 10.8*  HCT 31.4* 33.5*  MCV 97.5 97.1  PLT 160 167   Basic Metabolic Panel: Recent Labs  Lab 09/28/21 1846 09/29/21 0404 09/30/21 0400  NA 129* 133* 133*  K 3.8 4.0 3.7  CL 97* 99 100  CO2 29 28 26   GLUCOSE 108* 93 95  BUN 9 8 10   CREATININE 0.65 0.70 0.74  CALCIUM 9.0 9.0 8.9   Liver Function Tests: Recent Labs  Lab 09/28/21 1846  AST 43*  ALT 38  ALKPHOS 84  BILITOT 0.3  PROT 6.5  ALBUMIN 3.4*   CBG: No results for input(s): "GLUCAP" in the last 168 hours.  Discharge time spent: greater than 30 minutes.  Signed: , MD [facility] 09/30/2021

## 2021-09-30 NOTE — Care Management Important Message (Signed)
Important Message  Patient Details  Name: SOKHA CRAKER MRN: [id number] Date of Birth: 1934/10/17   Medicare Important Message Given:  N/A - LOS <3 / Initial given by admissions     Corey Harold 09/30/2021, 11:27 AM

## 2021-10-01 LAB — URINE CULTURE: Culture: >=100000 — AB

## 2021-10-03 NOTE — TOC Progression Note (Signed)
Transition of Care [facility]) - Progression Note    Patient Details  Name: Charlene Vincent MRN: [id number] Date of Birth: [birth year]  Transition of [facility]) CM/SW Contact  Elliot Gault, LCSW Phone Number: 10/03/2021, 2:54 PM  Clinical Narrative:     TOC received call from pt's son inquiring about the 3in1 that was ordered at the time of pt's dc on Tuesday of this week. Reviewed pt's record and contacted Zach at [employer] to request follow up. Ian Malkin stated he would look into this and contact pt's son to update. Message left for pt's son to inform of same.  Expected Discharge Plan: Home/Self Care Barriers to Discharge: Barriers Resolved  Expected Discharge Plan and Services Expected Discharge Plan: Home/Self Care       Living arrangements for the past 2 months: Single Family Home Expected Discharge Date: 09/30/21                                     Social Determinants of Health (SDOH) Interventions    Readmission Risk Interventions     No data to display

## 2022-01-14 NOTE — Progress Notes (Signed)
dPatient ID: Charlene Vincent, female   DOB: 08-Sep-1934, 87 y.o.   MRN: [id number]    Date:  01/19/2022   ID:  Charlene Vincent, DOB Aug 21, 1934, MRN [id number]  PCP:  Aletha Halim., PA-C  Primary Cardiologist:   Makael Stein Martinique MD   Chief Complaint  Patient presents with   Congestive Heart Failure     History of Present Illness: Charlene Vincent is a 87 y.o. female is seen for follow up Takotsubo CM. In August 2016 she presented with a foot injury from a mechanical fall. Her foot was treated and a laceration was repaired. An ECG was performed and was significantly abnormal. Her troponin was elevated. The ECG met STEMI criteria and she was transferred emergently to [facility] and taken directly to the cath lab.    Left heart catheterization  10/02/2014 revealed normal coronary arteries.  LVEF was 25-35%.   Hyperdynamic base consistent with Takotsubo  Syndrome.   She had an echocardiogram on 10/03/2014 which confirmed an ejection fraction of 30-35%. There is akinesis of the mid apical anteroseptal, inferior and apical myocardium. Grade 2 diastolic dysfunction. Moderate LVH. She had a similar episode of stress induced cardiomyopathy in 2012. EF returned to normal following that episode. Her most recent Echo in October 2016 showed an EF 45-50%.   She was last seen by me in Jan 2022. She was admitted in Sept 2022 with LE cellulitis. In August 2023 she was admitted with ileus. Felt to be related to pain meds and hypothyroidism.   On follow up today she is doing very well. Seen with her son. Denies any chest pain , dyspnea, palpitations, edema. No fatigue. Her energy  Level is good.   Wt Readings from Last 3 Encounters:  01/19/22 109 lb 9.6 oz (49.7 kg)  09/30/21 104 lb 14.4 oz (47.6 kg)  07/20/21 130 lb (59 kg)     Past Medical History:  Diagnosis Date   Anemia    Arthritis    Cellulitis    Costochondritis    Headache    Hypothyroidism    Myocardial infarction ([place]) 2012, 2016   both  Takotsubo   Pneumonia    Shingles    Takotsubo cardiomyopathy    a. 2012  b. LHC in 09/2014 nl cors, EF 30-35%>>45-50% by echo 11/2014   Tremor, essential     Current Outpatient Medications  Medication Sig Dispense Refill   acetaminophen (TYLENOL) 500 MG tablet Take 1,000 mg by mouth every 6 (six) hours.     atorvastatin (LIPITOR) 80 MG tablet Take 80 mg by mouth daily.     carvedilol (COREG) 3.125 MG tablet Take 1 tablet (3.125 mg total) by mouth 2 (two) times daily with a meal.     clotrimazole (GYNE-LOTRIMIN) 1 % vaginal cream Place 1 Applicatorful vaginally at bedtime. 45 g 0   docusate sodium (COLACE) 100 MG capsule Take 1 capsule (100 mg total) by mouth 2 (two) times daily. 60 capsule 1   ferrous sulfate 325 (65 FE) MG EC tablet Take 1 tablet by mouth 2 (two) times daily.     fluticasone (FLONASE) 50 MCG/ACT nasal spray Place 1-2 sprays into both nostrils daily.     levothyroxine (SYNTHROID) 112 MCG tablet Take 1 tablet (112 mcg total) by mouth daily. 30 tablet 1   lisinopril (ZESTRIL) 2.5 MG tablet Take 1 tablet (2.5 mg total) by mouth daily.     mupirocin ointment (BACTROBAN) 2 % Apply 1 Application topically 3 (three) times daily.  oxyCODONE-acetaminophen (PERCOCET/ROXICET) 5-325 MG tablet Take 0.5-1 tablets by mouth as needed for moderate pain.     pantoprazole (PROTONIX) 20 MG tablet Take 1 tablet (20 mg total) by mouth daily. 30 tablet 1   polyethylene glycol (MIRALAX / GLYCOLAX) 17 g packet Take 17 g by mouth daily. 28 each 2   No current facility-administered medications for this visit.    Allergies:    Allergies  Allergen Reactions   Aspirin     But taking low dose therapy without problems.   Nystatin Itching    She said when she used the cream it made symptoms worse    Penicillins Rash    Social History:  The patient  reports that she quit smoking about 11 years ago. Her smoking use included cigarettes. She smoked an average of .5 packs per day. She has never  used smokeless tobacco. She reports that she does not drink alcohol and does not use drugs.   Family history:   Family History  Problem Relation Age of Onset   Heart disease Father    Heart disease Brother     ROS:  Please see the history of present illness.  All other systems reviewed and negative.   PHYSICAL EXAM: VS:  BP 120/62 (BP Location: Right Arm, Patient Position: Sitting, Cuff Size: Normal)   Pulse 89   Ht _0  (1.651 m)   Wt 109 lb 9.6 oz (49.7 kg)   SpO2 98%   BMI 18.24 kg/m  GENERAL:  Elderly WF in NAD HEENT:  PERRL, EOMI, sclera are clear. Oropharynx is clear. NECK:  No jugular venous distention, carotid upstroke brisk and symmetric, no bruits, no thyromegaly or adenopathy LUNGS:  Clear to auscultation bilaterally CHEST:  Unremarkable HEART:  RRR,  PMI not displaced or sustained,S1 and S2 within normal limits, no S3, no S4: no clicks, no rubs, no murmurs ABD:  Soft, nontender. BS +, no masses or bruits. No hepatomegaly, no splenomegaly EXT:  2 + pulses throughout, no edema, no cyanosis no clubbing SKIN:  Warm and dry.  No rashes NEURO:  Alert and oriented x 3. Cranial nerves II through XII intact. PSYCH:  Cognitively intact   Lipid Panel     Component Value Date/Time   CHOL 82 10/04/2014 0300   TRIG 67 10/04/2014 0300   HDL 32 (L) 10/04/2014 0300   CHOLHDL 2.6 10/04/2014 0300   VLDL 13 10/04/2014 0300   LDLCALC 37 10/04/2014 0300   Lab Results  Component Value Date   WBC 5.9 09/29/2021   HGB 10.8 (L) 09/29/2021   HCT 33.5 (L) 09/29/2021   PLT 167 09/29/2021   GLUCOSE 95 09/30/2021   CHOL 82 10/04/2014   TRIG 67 10/04/2014   HDL 32 (L) 10/04/2014   LDLCALC 37 10/04/2014   ALT 38 09/28/2021   AST 43 (H) 09/28/2021   NA 133 (L) 09/30/2021   K 3.7 09/30/2021   CL 100 09/30/2021   CREATININE 0.74 09/30/2021   BUN 10 09/30/2021   CO2 26 09/30/2021   TSH 7.795 (H) 09/28/2021   INR 0.97 09/10/2010   HGBA1C 5.6 09/10/2010    Dated 11/27/19:  cholesterol 87, triglycerides 84, HDL 42, LDL 21. Sodium 132. Otherwise CMET normal. plts 149K otherwise CBC normal Dated 01/01/22: sodium 129, otherwise CMET normal. Hgb 11.9. TSH 12.78. cholesterol 107, triglycerides 98, HDL 46, LDL 34.   Ecg today shows NSR rate 67. Normal.   I have personally reviewed and interpreted this study.  Echo 12/06/14: Study Conclusions   - Left ventricle: The cavity size was normal. There was mild focal   basal hypertrophy of the septum. Systolic function was mildly   reduced. The estimated ejection fraction was in the range of 45%   to 50%. Wall motion was normal; there were no regional wall   motion abnormalities. Doppler parameters are consistent with   abnormal left ventricular relaxation (grade 1 diastolic   dysfunction). There was no evidence of elevated ventricular   filling pressure by Doppler parameters. - Aortic valve: Trileaflet; normal thickness leaflets. There was no   regurgitation. - Aortic root: The aortic root was normal in size. - Mitral valve: There was no regurgitation. - Right ventricle: The cavity size was normal. Wall thickness was   normal. Systolic function was normal. - Right atrium: The atrium was normal in size. - Pulmonary arteries: Systolic pressure was within the normal   range. - Inferior vena cava: The vessel was normal in size. - Pericardium, extracardiac: There was no pericardial effusion.   Impressions:   - Compared to the report from 10/04/2014 (images not available) LVEF   is now improved, estimated at 45-50%. There is hypokinesis of the   mid and apical inferior walls.  ASSESSMENT AND PLAN:  1. Takotsubo's cardiomyopathy with prior acute episodes in 2012 with recurrence in 2016. No symptoms since then. She is asymptomatic. Mild LV dysfunction noted on last Echo in 2016. Will continue low dose Coreg and lisinopril. She is euvolemic. Since she has done well from a CV standpoint since 2016 I think we can see her PRN  now.

## 2022-01-19 ENCOUNTER — Ambulatory Visit: Payer: Medicare Other | Attending: Cardiology | Admitting: Cardiology

## 2022-01-19 VITALS — BP 120/62 | HR 89 | Ht 65.0 in | Wt 109.6 lb

## 2022-01-19 DIAGNOSIS — I5181 Takotsubo syndrome: Secondary | ICD-10-CM

## 2022-01-19 MED ORDER — LISINOPRIL 2.5 MG PO TABS: 2.5000 mg | ORAL_TABLET | Freq: Every day | ORAL | 3 refills | Status: DC

## 2022-01-19 MED ORDER — CARVEDILOL 3.125 MG PO TABS: 3.1250 mg | ORAL_TABLET | Freq: Two times a day (BID) | ORAL | 3 refills | Status: AC

## 2022-06-05 ENCOUNTER — Encounter (HOSPITAL_COMMUNITY): Payer: Self-pay | Admitting: Emergency Medicine

## 2022-06-05 ENCOUNTER — Emergency Department (HOSPITAL_COMMUNITY): Payer: No Typology Code available for payment source

## 2022-06-05 ENCOUNTER — Other Ambulatory Visit: Payer: Self-pay

## 2022-06-05 ENCOUNTER — Emergency Department (HOSPITAL_COMMUNITY)
Admission: EM | Admit: 2022-06-05 | Discharge: 2022-06-05 | Disposition: A | Discharge status: Home / Self Care | Payer: No Typology Code available for payment source | Attending: Emergency Medicine | Admitting: Emergency Medicine

## 2022-06-05 DIAGNOSIS — M79661 Pain in right lower leg: Secondary | ICD-10-CM | POA: Insufficient documentation

## 2022-06-05 DIAGNOSIS — R2241 Localized swelling, mass and lump, right lower limb: Secondary | ICD-10-CM | POA: Insufficient documentation

## 2022-06-05 DIAGNOSIS — M79671 Pain in right foot: Secondary | ICD-10-CM | POA: Diagnosis present

## 2022-06-05 MED ORDER — OXYCODONE-ACETAMINOPHEN 5-325 MG PO TABS: 1.0000 | ORAL_TABLET | Freq: Four times a day (QID) | ORAL | 0 refills | Status: DC | PRN

## 2022-06-05 MED ORDER — OXYCODONE-ACETAMINOPHEN 5-325 MG PO TABS
1.0000 | ORAL_TABLET | Freq: Once | ORAL | Status: AC
  Administered 2022-06-05: 1 via ORAL
  Filled 2022-06-05: qty 1

## 2022-06-05 NOTE — ED Provider Notes (Signed)
[place] EMERGENCY DEPARTMENT AT [facility] Provider Note   CSN: [id number] Arrival date & time: 06/05/22  4098     History  Chief Complaint  Patient presents with   Ankle Pain    Charlene Vincent is a 88 y.o. female.  Patient presents to the department for evaluation of right lower leg and foot pain.  Patient accompanied by her son, they both provide information.  Patient reportedly had an injury several years ago of the right foot.  She has had chronic pain since.  Patient gets a prescription of 30 Percocet per month.  She had her last prescription filled on March 20.  She normally only takes it as needed.  She has run out because she has been using increased amounts of the pain medication because of this increased pain.  Denies any new injury.       Home Medications Prior to Admission medications   Medication Sig Start Date End Date Taking? Authorizing Provider  oxyCODONE-acetaminophen (PERCOCET) 5-325 MG tablet Take 1 tablet by mouth every 6 (six) hours as needed. 06/05/22  Yes Mihira Tozzi, Canary Brim, MD  acetaminophen (TYLENOL) 500 MG tablet Take 1,000 mg by mouth every 6 (six) hours.    [provider]  atorvastatin (LIPITOR) 80 MG tablet Take 80 mg by mouth daily.    [provider]  carvedilol (COREG) 3.125 MG tablet Take 1 tablet (3.125 mg total) by mouth 2 (two) times daily with a meal. 01/19/22   [place], Peter M, MD  clotrimazole (GYNE-LOTRIMIN) 1 % vaginal cream Place 1 Applicatorful vaginally at bedtime. 02/01/21   Achille Rich, PA-C  docusate sodium (COLACE) 100 MG capsule Take 1 capsule (100 mg total) by mouth 2 (two) times daily. 09/30/21   Vassie Loll, MD  ferrous sulfate 325 (65 FE) MG EC tablet Take 1 tablet by mouth 2 (two) times daily. 08/19/21   [provider]  fluticasone (FLONASE) 50 MCG/ACT nasal spray Place 1-2 sprays into both nostrils daily. 09/02/16   [provider]  levothyroxine (SYNTHROID) 112 MCG  tablet Take 1 tablet (112 mcg total) by mouth daily. 10/01/21   Vassie Loll, MD  lisinopril (ZESTRIL) 2.5 MG tablet Take 1 tablet (2.5 mg total) by mouth daily. 01/19/22   [place], Peter M, MD  mupirocin ointment (BACTROBAN) 2 % Apply 1 Application topically 3 (three) times daily. 07/28/21   [provider]  oxyCODONE-acetaminophen (PERCOCET/ROXICET) 5-325 MG tablet Take 0.5-1 tablets by mouth as needed for moderate pain. 10/24/20   [provider]  pantoprazole (PROTONIX) 20 MG tablet Take 1 tablet (20 mg total) by mouth daily. 09/30/21 09/30/22  Vassie Loll, MD  polyethylene glycol (MIRALAX / GLYCOLAX) 17 g packet Take 17 g by mouth daily. 10/01/21   Vassie Loll, MD      Allergies    Aspirin, Nystatin, and Penicillins    Review of Systems   Review of Systems  Physical Exam Updated Vital Signs BP (!) 155/73   Pulse (!) 55   Temp 98.7 F (37.1 C) (Oral)   Resp 17   Ht  (1.651 m)   Wt 49.7 kg   SpO2 99%   BMI 18.23 kg/m  Physical Exam Vitals and nursing note reviewed.  Constitutional:      General: She is not in acute distress.    Appearance: She is well-developed.  HENT:     Head: Normocephalic and atraumatic.     Mouth/Throat:     Mouth: Mucous membranes are  moist.  Eyes:     General: Vision grossly intact. Gaze aligned appropriately.     Extraocular Movements: Extraocular movements intact.     Conjunctiva/sclera: Conjunctivae normal.  Cardiovascular:     Rate and Rhythm: Normal rate and regular rhythm.     Pulses: Normal pulses.          Dorsalis pedis pulses are 2+ on the right side.     Heart sounds: Normal heart sounds, S1 normal and S2 normal. No murmur heard.    No friction rub. No gallop.  Pulmonary:     Effort: Pulmonary effort is normal. No respiratory distress.     Breath sounds: Normal breath sounds.  Abdominal:     General: Bowel sounds are normal.     Palpations: Abdomen is soft.     Tenderness: There is no abdominal tenderness.  There is no guarding or rebound.     Hernia: No hernia is present.  Musculoskeletal:        General: No swelling.     Cervical back: Full passive range of motion without pain, normal range of motion and neck supple. No spinous process tenderness or muscular tenderness. Normal range of motion.     Right lower leg: Tenderness present. No swelling or deformity. No edema.     Left lower leg: No edema.     Right foot: Swelling and tenderness present. Normal pulse.  Skin:    General: Skin is warm and dry.     Capillary Refill: Capillary refill takes less than 2 seconds.     Findings: No ecchymosis, erythema, rash or wound.  Neurological:     General: No focal deficit present.     Mental Status: She is alert and oriented to person, place, and time.     GCS: GCS eye subscore is 4. GCS verbal subscore is 5. GCS motor subscore is 6.     Cranial Nerves: Cranial nerves 2-12 are intact.     Sensory: Sensation is intact.     Motor: Motor function is intact.     Coordination: Coordination is intact.  Psychiatric:        Attention and Perception: Attention normal.        Mood and Affect: Mood normal.        Speech: Speech normal.        Behavior: Behavior normal.     ED Results / Procedures / Treatments   Labs (all labs ordered are listed, but only abnormal results are displayed) Labs Reviewed - No data to display  EKG None  Radiology DG Tibia/Fibula Right  Result Date: 06/05/2022 CLINICAL DATA:  Chronic right ankle pain EXAM: RIGHT TIBIA AND FIBULA - 2 VIEW COMPARISON:  05/20/2021 FINDINGS: There is no evidence of fracture or other focal bone lesions. Soft tissues are unremarkable. Generalized osteopenia. IMPRESSION: Negative. Electronically Signed   By: Tiburcio Pea M.D.   On: 06/05/2022 06:15   DG Foot Complete Right  Result Date: 06/05/2022 CLINICAL DATA:  Chronic right ankle pain EXAM: RIGHT FOOT COMPLETE - 3 VIEW COMPARISON:  09/17/2014 FINDINGS: Limited assessment of the toes due  to hammertoe deformity. Remote fractures of the fourth and fifth proximal phalanges, confirmed on prior. No acute fracture or dislocation is seen. Generalized osteopenia. IMPRESSION: 1. No acute finding. 2. Limited assessment of the digits due to hammertoe deformities. Remote fourth and fifth phalangeal fractures. Electronically Signed   By: Tiburcio Pea M.D.   On: 06/05/2022 06:14    Procedures Procedures  Medications Ordered in ED Medications - No data to display  ED Course/ Medical Decision Making/ A&P                             Medical Decision Making Amount and/or Complexity of Data Reviewed Radiology: ordered.   Patient presents to the emergency department for evaluation of lower leg and foot pain.  Patient has a history of chronic pain in this area from a previous injury.  She has not had any new injuries.  Differential diagnosis considered includes, but not limited to:  Orthopedic injury; arthritis; septic arthritis; ischemic limb; DVT; cellulitis; phlebitis  Initial examination reveals what appears to be somewhat chronic deformity/swelling of the foot.  She does have preserved range of motion of the ankle and foot, no warmth or redness, examination does not suggest septic arthritis.  She does have diffuse foot tenderness.  She has bounding pulses showed no evidence of ischemia.  Overlying skin is unchanged, no signs of infection.  Patient does have some distal tibia/fibula area tenderness but no deformity.  There is no calf or soft tissue tenderness, negative venous cords, negative Homans' sign.  Presentation not consistent with DVT.  This pain seems to be somewhat chronic.  It has increased recently which has caused her to run out of her pain medication.  She only gets 30 pills/month and has used them up in 3 weeks which is not unreasonable.  Will give her additional pain medication for the weekend, referral to podiatry for repeat evaluation.  Follow-up with PCP as  well.          Final Clinical Impression(s) / ED Diagnoses Final diagnoses:  Right foot pain    Rx / DC Orders ED Discharge Orders          Ordered    oxyCODONE-acetaminophen (PERCOCET) 5-325 MG tablet  Every 6 hours PRN        06/05/22 0923              Gilda Crease, MD 06/05/22 [phone]

## 2022-06-05 NOTE — ED Notes (Signed)
Pt ambulated to the bathroom and back to room with assistance.  

## 2022-06-05 NOTE — ED Triage Notes (Signed)
Pt arrives from home POV with son. Pt c/o chronic right ankle pain. Pt recently had her percocet refilled on 05/13/22 and is currently out of this medication.

## 2023-01-24 ENCOUNTER — Emergency Department (HOSPITAL_COMMUNITY): Payer: No Typology Code available for payment source

## 2023-01-24 ENCOUNTER — Emergency Department (HOSPITAL_COMMUNITY)
Admission: EM | Admit: 2023-01-24 | Discharge: 2023-01-24 | Disposition: A | Discharge status: Home / Self Care | Payer: No Typology Code available for payment source

## 2023-01-24 DIAGNOSIS — E039 Hypothyroidism, unspecified: Secondary | ICD-10-CM | POA: Insufficient documentation

## 2023-01-24 DIAGNOSIS — Z79890 Hormone replacement therapy (postmenopausal): Secondary | ICD-10-CM | POA: Insufficient documentation

## 2023-01-24 DIAGNOSIS — R42 Dizziness and giddiness: Secondary | ICD-10-CM | POA: Insufficient documentation

## 2023-01-24 DIAGNOSIS — I2510 Atherosclerotic heart disease of native coronary artery without angina pectoris: Secondary | ICD-10-CM | POA: Diagnosis not present

## 2023-01-24 LAB — CBC
HCT: 34.6 % — ABNORMAL LOW (ref 36.0–46.0)
Hemoglobin: 10.7 g/dL — ABNORMAL LOW (ref 12.0–15.0)
MCH: 29.8 pg (ref 26.0–34.0)
MCHC: 30.9 g/dL (ref 30.0–36.0)
MCV: 96.4 fL (ref 80.0–100.0)
Platelets: 194 10*3/uL (ref 150–400)
RBC: 3.59 MIL/uL — ABNORMAL LOW (ref 3.87–5.11)
RDW: 13.2 % (ref 11.5–15.5)
WBC: 5.2 10*3/uL (ref 4.0–10.5)
nRBC: 0 % (ref 0.0–0.2)

## 2023-01-24 LAB — BASIC METABOLIC PANEL
Anion gap: 9 (ref 5–15)
BUN: 12 mg/dL (ref 8–23)
CO2: 22 mmol/L (ref 22–32)
Calcium: 8.6 mg/dL — ABNORMAL LOW (ref 8.9–10.3)
Chloride: 98 mmol/L (ref 98–111)
Creatinine, Ser: 0.69 mg/dL (ref 0.44–1.00)
GFR, Estimated: >60 mL/min (ref >60)
Glucose, Bld: 94 mg/dL (ref 70–99)
Potassium: 4.2 mmol/L (ref 3.5–5.1)
Sodium: 129 mmol/L — ABNORMAL LOW (ref 135–145)

## 2023-01-24 LAB — TSH: TSH: 8.569 u[IU]/mL — ABNORMAL HIGH (ref 0.350–4.500)

## 2023-01-24 LAB — TROPONIN I (HIGH SENSITIVITY): Troponin I (High Sensitivity): 5 ng/L (ref <18)

## 2023-01-24 MED ORDER — SODIUM CHLORIDE 0.9 % IV BOLUS
500.0000 mL | Freq: Once | INTRAVENOUS | Status: AC
  Administered 2023-01-24: 500 mL via INTRAVENOUS

## 2023-01-24 NOTE — Discharge Instructions (Signed)
Your workup today was reassuring.  Please follow-up with your doctor.  Return to the ER for worsening symptoms.

## 2023-01-24 NOTE — ED Triage Notes (Signed)
Pt to ed from home where she lives with family via ems. Pt was dizzy this morning and feeling more weak. Pt denies cp, sob, loc, recent trauma/falls.

## 2023-01-24 NOTE — ED Provider Notes (Signed)
[place] EMERGENCY DEPARTMENT AT [facility] Provider Note   CSN: [id number] Arrival date & time: 01/24/23  1628     History  Chief Complaint  Patient presents with   Dizziness    Charlene Vincent is a 88 y.o. female.  88 year old female with past medical history of coronary artery disease and hypothyroidism presenting to the emergency department today with feeling "dizzy headed".  The patient states that this has been going on now for the past day or 2.  She denies any headaches.  Denies any focal weakness, numbness, or tingling.  When asked if this is more of a lightheadedness or dizziness the patient cannot say for sure.  She states that she has been able to ambulate despite this.  She denies any similar symptoms in the past.  In the triage note there is note about constipation.  Did speak with the patient she denies any abdominal pain.  Denies any constipation.  She states that she had a normal bowel movement this morning.  She is denying any chest pain or abdominal pain.   Dizziness      Home Medications Prior to Admission medications   Medication Sig Start Date End Date Taking? Authorizing Provider  acetaminophen (TYLENOL) 500 MG tablet Take 1,000 mg by mouth every 6 (six) hours.    [provider]  atorvastatin (LIPITOR) 80 MG tablet Take 80 mg by mouth daily.    [provider]  carvedilol (COREG) 3.125 MG tablet Take 1 tablet (3.125 mg total) by mouth 2 (two) times daily with a meal. 01/19/22   [place], Peter M, MD  clotrimazole (GYNE-LOTRIMIN) 1 % vaginal cream Place 1 Applicatorful vaginally at bedtime. 02/01/21   Achille Rich, PA-C  docusate sodium (COLACE) 100 MG capsule Take 1 capsule (100 mg total) by mouth 2 (two) times daily. 09/30/21   Vassie Loll, MD  ferrous sulfate 325 (65 FE) MG EC tablet Take 1 tablet by mouth 2 (two) times daily. 08/19/21   [provider]  fluticasone (FLONASE) 50 MCG/ACT nasal spray Place 1-2 sprays  into both nostrils daily. 09/02/16   [provider]  levothyroxine (SYNTHROID) 112 MCG tablet Take 1 tablet (112 mcg total) by mouth daily. 10/01/21   Vassie Loll, MD  lisinopril (ZESTRIL) 2.5 MG tablet Take 1 tablet (2.5 mg total) by mouth daily. 01/19/22   [place], Peter M, MD  mupirocin ointment (BACTROBAN) 2 % Apply 1 Application topically 3 (three) times daily. 07/28/21   [provider]  oxyCODONE-acetaminophen (PERCOCET) 5-325 MG tablet Take 1 tablet by mouth every 6 (six) hours as needed. 06/05/22   Gilda Crease, MD  oxyCODONE-acetaminophen (PERCOCET/ROXICET) 5-325 MG tablet Take 0.5-1 tablets by mouth as needed for moderate pain. 10/24/20   [provider]  pantoprazole (PROTONIX) 20 MG tablet Take 1 tablet (20 mg total) by mouth daily. 09/30/21 09/30/22  Vassie Loll, MD  polyethylene glycol (MIRALAX / GLYCOLAX) 17 g packet Take 17 g by mouth daily. 10/01/21   Vassie Loll, MD      Allergies    Aspirin, Nystatin, and Penicillins    Review of Systems   Review of Systems  Neurological:  Positive for dizziness.  All other systems reviewed and are negative.   Physical Exam Updated Vital Signs BP (!) 169/115 (BP Location: Right Arm)   Pulse 77   Resp 14   Ht 5\' 5"  (1.651 m)   Wt 49 kg   SpO2 100%   BMI 17.98 kg/m  Physical Exam Vitals and nursing note reviewed.   Gen: NAD Eyes: PERRL, EOMI, no obvious unilateral nystagmus noted HEENT: no oropharyngeal swelling Neck: trachea midline Resp: clear to auscultation bilaterally Card: RRR, no murmurs, rubs, or gallops Abd: nontender, nondistended Extremities: no calf tenderness, no edema Vascular: 2+ radial pulses bilaterally, 2+ DP pulses bilaterally Neuro: Cranial nerves intact, equal strength and sensation throughout bilateral upper and lower extremities, no dysmetria on finger-to-nose testing Skin: no rashes Psyc: acting appropriately   ED Results / Procedures / Treatments   Labs (all  labs ordered are listed, but only abnormal results are displayed) Labs Reviewed  BASIC METABOLIC PANEL - Abnormal; Notable for the following components:      Result Value   Sodium 129 (*)    Calcium 8.6 (*)    All other components within normal limits  CBC - Abnormal; Notable for the following components:   RBC 3.59 (*)    Hemoglobin 10.7 (*)    HCT 34.6 (*)    All other components within normal limits  TSH - Abnormal; Notable for the following components:   TSH 8.569 (*)    All other components within normal limits  TROPONIN I (HIGH SENSITIVITY)  TROPONIN I (HIGH SENSITIVITY)    EKG EKG Interpretation Date/Time:  Sunday January 24 2023 17:05:56 EST Ventricular Rate:  65 PR Interval:  164 QRS Duration:  99 QT Interval:  426 QTC Calculation: 447 R Axis:   66  Text Interpretation: Sinus rhythm Low voltage, extremity leads Confirmed by Beckey Downing [phone]) on 01/24/2023 8:14:10 PM  Radiology CT Head Wo Contrast  Result Date: 01/24/2023 CLINICAL DATA:  Neuro deficit, acute, stroke suspected EXAM: CT HEAD WITHOUT CONTRAST TECHNIQUE: Contiguous axial images were obtained from the base of the skull through the vertex without intravenous contrast. RADIATION DOSE REDUCTION: This exam was performed according to the departmental dose-optimization program which includes automated exposure control, adjustment of the mA and/or kV according to patient size and/or use of iterative reconstruction technique. COMPARISON:  None Available. FINDINGS: Brain: Cerebral ventricle sizes are concordant with the degree of cerebral volume loss. Patchy and confluent areas of decreased attenuation are noted throughout the deep and periventricular white matter of the cerebral hemispheres bilaterally, compatible with chronic microvascular ischemic disease. No evidence of large-territorial acute infarction. No parenchymal hemorrhage. No mass lesion. No extra-axial collection. No mass effect or midline shift. No  hydrocephalus. Basilar cisterns are patent. Vascular: No hyperdense vessel. Skull: No acute fracture or focal lesion. Sinuses/Orbits: Paranasal sinuses and mastoid air cells are clear. The orbits are unremarkable. Other: None. IMPRESSION: No acute intracranial abnormality. Electronically Signed   By: Tish Frederickson M.D.   On: 01/24/2023 18:53    Procedures Procedures    Medications Ordered in ED Medications  sodium chloride 0.9 % bolus 500 mL (0 mLs Intravenous Stopped 01/24/23 2007)    ED Course/ Medical Decision Making/ A&P                                 Medical Decision Making 88 year old female with past medical history of coronary artery disease and hypothyroidism presenting to the emergency department today with dizziness.  I will further evaluate the patient here with an EKG and basic labs to evaluate for anemia, electrolyte abnormalities, or conduction disturbances.  Also obtain a TSH.  I will keep the patient here on the monitor.  I give her a small fluid bolus here.  Will obtain CT scan of her head to evaluate for intracranial hemorrhage or mass lesion.  She reports that her symptoms have improved.  Will hold off on any medications at this time.  I will reevaluate for ultimate disposition.  The patient was feeling better on reassessment.  She is ambulatory here with a walker which she uses at home at her baseline.  She is not complaining of any dizziness here.  Her workup is reassuring.  I think that she is stable for discharge.  Amount and/or Complexity of Data Reviewed Labs: ordered. Radiology: ordered.          Final Clinical Impression(s) / ED Diagnoses Final diagnoses:  Lightheadedness    Rx / DC Orders ED Discharge Orders     None         Durwin Glaze, MD 01/24/23 2015

## 2023-01-24 NOTE — ED Notes (Signed)
Patient walks with walker at home. Patient ambulated with walker in hallway with no assistance or complaints.

## 2023-06-12 ENCOUNTER — Encounter (HOSPITAL_COMMUNITY): Payer: Self-pay

## 2023-06-12 ENCOUNTER — Emergency Department (HOSPITAL_COMMUNITY)

## 2023-06-12 ENCOUNTER — Other Ambulatory Visit: Payer: Self-pay

## 2023-06-12 ENCOUNTER — Inpatient Hospital Stay (HOSPITAL_COMMUNITY)
Admission: EM | Admit: 2023-06-12 | Discharge: 2023-06-14 | DRG: 389 | Disposition: A | Discharge status: Home / Self Care | Attending: Infectious Diseases | Admitting: Infectious Diseases

## 2023-06-12 DIAGNOSIS — N390 Urinary tract infection, site not specified: Secondary | ICD-10-CM | POA: Diagnosis present

## 2023-06-12 DIAGNOSIS — E440 Moderate protein-calorie malnutrition: Secondary | ICD-10-CM | POA: Diagnosis present

## 2023-06-12 DIAGNOSIS — K567 Ileus, unspecified: Secondary | ICD-10-CM | POA: Diagnosis not present

## 2023-06-12 DIAGNOSIS — R1084 Generalized abdominal pain: Secondary | ICD-10-CM

## 2023-06-12 DIAGNOSIS — Z8619 Personal history of other infectious and parasitic diseases: Secondary | ICD-10-CM

## 2023-06-12 DIAGNOSIS — N3000 Acute cystitis without hematuria: Secondary | ICD-10-CM | POA: Diagnosis present

## 2023-06-12 DIAGNOSIS — Z862 Personal history of diseases of the blood and blood-forming organs and certain disorders involving the immune mechanism: Secondary | ICD-10-CM

## 2023-06-12 DIAGNOSIS — I252 Old myocardial infarction: Secondary | ICD-10-CM

## 2023-06-12 DIAGNOSIS — Z888 Allergy status to other drugs, medicaments and biological substances status: Secondary | ICD-10-CM

## 2023-06-12 DIAGNOSIS — K449 Diaphragmatic hernia without obstruction or gangrene: Secondary | ICD-10-CM

## 2023-06-12 DIAGNOSIS — E785 Hyperlipidemia, unspecified: Secondary | ICD-10-CM | POA: Diagnosis present

## 2023-06-12 DIAGNOSIS — N3090 Cystitis, unspecified without hematuria: Secondary | ICD-10-CM

## 2023-06-12 DIAGNOSIS — Z8249 Family history of ischemic heart disease and other diseases of the circulatory system: Secondary | ICD-10-CM

## 2023-06-12 DIAGNOSIS — Z886 Allergy status to analgesic agent status: Secondary | ICD-10-CM

## 2023-06-12 DIAGNOSIS — Z681 Body mass index (BMI) 19 or less, adult: Secondary | ICD-10-CM

## 2023-06-12 DIAGNOSIS — Z87891 Personal history of nicotine dependence: Secondary | ICD-10-CM

## 2023-06-12 DIAGNOSIS — K440 Diaphragmatic hernia with obstruction, without gangrene: Secondary | ICD-10-CM | POA: Diagnosis present

## 2023-06-12 DIAGNOSIS — G250 Essential tremor: Secondary | ICD-10-CM | POA: Diagnosis present

## 2023-06-12 DIAGNOSIS — I110 Hypertensive heart disease with heart failure: Secondary | ICD-10-CM | POA: Diagnosis present

## 2023-06-12 DIAGNOSIS — Z79891 Long term (current) use of opiate analgesic: Secondary | ICD-10-CM

## 2023-06-12 DIAGNOSIS — H9190 Unspecified hearing loss, unspecified ear: Secondary | ICD-10-CM | POA: Diagnosis present

## 2023-06-12 DIAGNOSIS — Z90710 Acquired absence of both cervix and uterus: Secondary | ICD-10-CM

## 2023-06-12 DIAGNOSIS — R54 Age-related physical debility: Secondary | ICD-10-CM | POA: Diagnosis present

## 2023-06-12 DIAGNOSIS — G8929 Other chronic pain: Secondary | ICD-10-CM | POA: Diagnosis present

## 2023-06-12 DIAGNOSIS — Z880 Allergy status to penicillin: Secondary | ICD-10-CM

## 2023-06-12 DIAGNOSIS — I5022 Chronic systolic (congestive) heart failure: Secondary | ICD-10-CM | POA: Diagnosis present

## 2023-06-12 DIAGNOSIS — E871 Hypo-osmolality and hyponatremia: Secondary | ICD-10-CM | POA: Diagnosis present

## 2023-06-12 DIAGNOSIS — E039 Hypothyroidism, unspecified: Secondary | ICD-10-CM | POA: Diagnosis present

## 2023-06-12 DIAGNOSIS — I255 Ischemic cardiomyopathy: Secondary | ICD-10-CM | POA: Diagnosis present

## 2023-06-12 DIAGNOSIS — Z8701 Personal history of pneumonia (recurrent): Secondary | ICD-10-CM

## 2023-06-12 DIAGNOSIS — K529 Noninfective gastroenteritis and colitis, unspecified: Secondary | ICD-10-CM | POA: Diagnosis present

## 2023-06-12 DIAGNOSIS — Z79899 Other long term (current) drug therapy: Secondary | ICD-10-CM

## 2023-06-12 DIAGNOSIS — Z79890 Hormone replacement therapy (postmenopausal): Secondary | ICD-10-CM

## 2023-06-12 DIAGNOSIS — K5909 Other constipation: Secondary | ICD-10-CM | POA: Diagnosis present

## 2023-06-12 DIAGNOSIS — Z9889 Other specified postprocedural states: Secondary | ICD-10-CM

## 2023-06-12 DIAGNOSIS — G3184 Mild cognitive impairment, so stated: Secondary | ICD-10-CM | POA: Diagnosis present

## 2023-06-12 LAB — URINALYSIS, ROUTINE W REFLEX MICROSCOPIC
Bilirubin Urine: NEGATIVE
Glucose, UA: NEGATIVE mg/dL
Hgb urine dipstick: NEGATIVE
Ketones, ur: NEGATIVE mg/dL
Nitrite: NEGATIVE
Protein, ur: NEGATIVE mg/dL
Specific Gravity, Urine: 1.005 (ref 1.005–1.030)
WBC, UA: >50 WBC/hpf (ref 0–5)
pH: 7 (ref 5.0–8.0)

## 2023-06-12 LAB — CBC WITH DIFFERENTIAL/PLATELET
Abs Immature Granulocytes: 0.02 10*3/uL (ref 0.00–0.07)
Basophils Absolute: 0 10*3/uL (ref 0.0–0.1)
Basophils Relative: 0 %
Eosinophils Absolute: 0.1 10*3/uL (ref 0.0–0.5)
Eosinophils Relative: 3 %
HCT: 32.7 % — ABNORMAL LOW (ref 36.0–46.0)
Hemoglobin: 10.9 g/dL — ABNORMAL LOW (ref 12.0–15.0)
Immature Granulocytes: 0 %
Lymphocytes Relative: 22 %
Lymphs Abs: 1.2 10*3/uL (ref 0.7–4.0)
MCH: 30.7 pg (ref 26.0–34.0)
MCHC: 33.3 g/dL (ref 30.0–36.0)
MCV: 92.1 fL (ref 80.0–100.0)
Monocytes Absolute: 0.9 10*3/uL (ref 0.1–1.0)
Monocytes Relative: 15 %
Neutro Abs: 3.3 10*3/uL (ref 1.7–7.7)
Neutrophils Relative %: 60 %
Platelets: 159 10*3/uL (ref 150–400)
RBC: 3.55 MIL/uL — ABNORMAL LOW (ref 3.87–5.11)
RDW: 13.5 % (ref 11.5–15.5)
WBC: 5.6 10*3/uL (ref 4.0–10.5)
nRBC: 0 % (ref 0.0–0.2)

## 2023-06-12 LAB — SODIUM, URINE, RANDOM: Sodium, Ur: 54 mmol/L

## 2023-06-12 LAB — COMPREHENSIVE METABOLIC PANEL WITH GFR
ALT: 25 U/L (ref 0–44)
AST: 30 U/L (ref 15–41)
Albumin: 3.4 g/dL — ABNORMAL LOW (ref 3.5–5.0)
Alkaline Phosphatase: 74 U/L (ref 38–126)
Anion gap: 8 (ref 5–15)
BUN: 9 mg/dL (ref 8–23)
CO2: 26 mmol/L (ref 22–32)
Calcium: 9.2 mg/dL (ref 8.9–10.3)
Chloride: 92 mmol/L — ABNORMAL LOW (ref 98–111)
Creatinine, Ser: 0.79 mg/dL (ref 0.44–1.00)
GFR, Estimated: >60 mL/min (ref >60)
Glucose, Bld: 104 mg/dL — ABNORMAL HIGH (ref 70–99)
Potassium: 3.9 mmol/L (ref 3.5–5.1)
Sodium: 126 mmol/L — ABNORMAL LOW (ref 135–145)
Total Bilirubin: 0.6 mg/dL (ref 0.0–1.2)
Total Protein: 6.4 g/dL — ABNORMAL LOW (ref 6.5–8.1)

## 2023-06-12 LAB — OSMOLALITY: Osmolality: 268 mosm/kg — ABNORMAL LOW (ref 275–295)

## 2023-06-12 LAB — OSMOLALITY, URINE: Osmolality, Ur: 202 mosm/kg — ABNORMAL LOW (ref 300–900)

## 2023-06-12 LAB — POC OCCULT BLOOD, ED: Fecal Occult Bld: NEGATIVE

## 2023-06-12 LAB — LIPASE, BLOOD: Lipase: 59 U/L — ABNORMAL HIGH (ref 11–51)

## 2023-06-12 MED ORDER — LACTATED RINGERS IV SOLN: INTRAVENOUS | Status: DC

## 2023-06-12 MED ORDER — IOHEXOL 350 MG/ML SOLN
75.0000 mL | Freq: Once | INTRAVENOUS | Status: AC | PRN
  Administered 2023-06-12: 75 mL via INTRAVENOUS

## 2023-06-12 MED ORDER — PANTOPRAZOLE SODIUM 20 MG PO TBEC
20.0000 mg | DELAYED_RELEASE_TABLET | Freq: Every day | ORAL | Status: DC
  Administered 2023-06-12 – 2023-06-14 (×3): 20 mg via ORAL
  Filled 2023-06-12 (×3): qty 1

## 2023-06-12 MED ORDER — ENOXAPARIN SODIUM 40 MG/0.4ML IJ SOSY
40.0000 mg | PREFILLED_SYRINGE | Freq: Every day | INTRAMUSCULAR | Status: DC
Start: 2023-06-12 — End: 2023-06-14
  Administered 2023-06-12 – 2023-06-14 (×3): 40 mg via SUBCUTANEOUS
  Filled 2023-06-12 (×2): qty 0.4

## 2023-06-12 MED ORDER — SODIUM CHLORIDE 0.9 % IV SOLN
1.0000 g | Freq: Once | INTRAVENOUS | Status: AC
  Administered 2023-06-12: 1 g via INTRAVENOUS
  Filled 2023-06-12: qty 10

## 2023-06-12 MED ORDER — SODIUM CHLORIDE 0.9 % IV SOLN
2.0000 g | INTRAVENOUS | Status: DC
  Administered 2023-06-13 – 2023-06-14 (×2): 2 g via INTRAVENOUS
  Filled 2023-06-12 (×2): qty 20

## 2023-06-12 MED ORDER — LEVOTHYROXINE SODIUM 112 MCG PO TABS
112.0000 ug | ORAL_TABLET | Freq: Every day | ORAL | Status: DC
  Administered 2023-06-13 – 2023-06-14 (×2): 112 ug via ORAL
  Filled 2023-06-12 (×2): qty 1

## 2023-06-12 MED ORDER — LISINOPRIL 5 MG PO TABS
2.5000 mg | ORAL_TABLET | Freq: Every day | ORAL | Status: DC
  Administered 2023-06-14: 2.5 mg via ORAL
  Filled 2023-06-12 (×2): qty 1

## 2023-06-12 MED ORDER — CARVEDILOL 3.125 MG PO TABS
3.1250 mg | ORAL_TABLET | Freq: Two times a day (BID) | ORAL | Status: DC
  Administered 2023-06-12 – 2023-06-14 (×4): 3.125 mg via ORAL
  Filled 2023-06-12 (×4): qty 1

## 2023-06-12 NOTE — ED Notes (Signed)
RN assisted pt to restroom. 

## 2023-06-12 NOTE — ED Notes (Signed)
 NT bladder scanned pt. Noted 222 mL   Pt will attempt to use restroom than if not able to pee. I/O will be completed.

## 2023-06-12 NOTE — ED Triage Notes (Signed)
 Pt c.o abd pain, lower back pain and bilateral leg pain for a few days. States her hemorrhoids are also bothering her. Denies nausea.

## 2023-06-12 NOTE — H&P (Signed)
 Date: 06/12/2023               Patient Name:  Charlene Vincent MRN: [id number]  DOB: 12-26-1934 Age / Sex: 89 y.o., female   PCP: Lory Rough., PA-C         Medical Service: Internal Medicine Teaching Service         Attending Physician: Dr. Gayl Katos, Clementina Cutter, *      First Contact: Maxie Spaniel, MD     Second Contact: Chrissie Coupe, MD          After Hours (After 5p/  First Contact Pager: [phone]  weekends / holidays): Second Contact Pager: [phone]   SUBJECTIVE   Chief Complaint: Back pain, polyuria  History of Present Illness: Charlene Vincent is an 89 YO F with a PMH of Takotsubo cardiomyopathy, hyponatremia, hypothyroidism, chronic pain, hyperlipidemia, and hypertension presenting to the ED with back pain and polyuria admitted for UTI and ileus.  History complicated by patient's chronic hearing loss. She initially presented after experiencing lower back pain with bilateral radiation for the past few days. She also continues to endorse polyuria and minor dysuria but denies any fever, chills, chest pain, or shortness of breath. She also has had some generalized abdominal pain but with no nausea, vomiting, diarrhea, or changes in bowel movements. Attempted to contact sons for further collateral but unable to reach them.   ED Course:  Na 126 Lipase 59 Hemoglobin 10.9 WBC 5.6 Negative fecal occult UA notable for large leukocytes with WBC > 50 but negative for nitrites  CT abdomen notable for mildly dilated loops of bowels favorable for enteritis vs ileus  Meds:  Current Meds  Medication Sig   acetaminophen  (TYLENOL ) 500 MG tablet Take 1,000 mg by mouth every 6 (six) hours.   atorvastatin  (LIPITOR ) 80 MG tablet Take 80 mg by mouth daily.   carvedilol  (COREG ) 3.125 MG tablet Take 1 tablet (3.125 mg total) by mouth 2 (two) times daily with a meal.   clotrimazole  (GYNE-LOTRIMIN ) 1 % vaginal cream Place 1 Applicatorful vaginally at bedtime.   docusate sodium  (COLACE)  100 MG capsule Take 1 capsule (100 mg total) by mouth 2 (two) times daily.   ferrous sulfate  325 (65 FE) MG EC tablet Take 1 tablet by mouth 2 (two) times daily.   fluticasone  (FLONASE ) 50 MCG/ACT nasal spray Place 1-2 sprays into both nostrils daily.   levothyroxine  (SYNTHROID ) 112 MCG tablet Take 1 tablet (112 mcg total) by mouth daily.   lisinopril  (ZESTRIL ) 2.5 MG tablet Take 1 tablet (2.5 mg total) by mouth daily.   mupirocin  ointment (BACTROBAN ) 2 % Apply 1 Application topically 3 (three) times daily.   oxyCODONE -acetaminophen  (PERCOCET/ROXICET) 5-325 MG tablet Take 0.5-1 tablets by mouth as needed for moderate pain.   pantoprazole  (PROTONIX ) 20 MG tablet Take 1 tablet (20 mg total) by mouth daily.    Past Medical History Takotsubo cardiomyopathy  Hyponatremia Hypothyroidism  Chronic pain HLD Hypertension  Past Surgical History:  Procedure Laterality Date   ABDOMINAL HYSTERECTOMY     APPLICATION OF A-CELL OF EXTREMITY Left 01/22/2015   Procedure: APPLICATION OF A-CELL TO LEFT THIGH ;  Surgeon: Alger Infield, MD;  Location: [facility] OR;  Service: Plastics;  Laterality: Left;   APPLICATION OF WOUND VAC Left 10/09/2014   Procedure: APPLICATION OF WOUND VAC;  Surgeon: Jasmine Mesi, MD;  Location: [facility] OR;  Service: Orthopedics;  Laterality: Left;   CARDIAC CATHETERIZATION  September 10, 2010   EF 35 to 40%. No  significant CAD. ? takotsubo cardiomyopathy   CARDIAC CATHETERIZATION N/A 10/02/2014   Procedure: Left Heart Cath and Coronary Angiography;  Surgeon: Peter M Swaziland, MD;  Location: [facility] INVASIVE CV LAB;  Service: Cardiovascular;  Laterality: N/A;   I & D EXTREMITY Left 10/09/2014   Procedure: DEBRIDEMENT LEFT FOOT;  Surgeon: Jasmine Mesi, MD;  Location: [facility] OR;  Service: Orthopedics;  Laterality: Left;   INGUINAL HERNIA REPAIR     SKIN FULL THICKNESS GRAFT Left 10/12/2014   Procedure: Debridement left foot, application integra, wound vacuum-assisted closure;  Surgeon: Alger Infield, MD;  Location: [facility] OR;  Service: Plastics;  Laterality: Left;   SKIN GRAFT Left 01/22/2015   left thigh to left foot   SKIN SPLIT GRAFT Left 01/22/2015   Procedure:  SPLIT THICKNESS SKIN GRAFT FROM LEFT THIGH TO LEFT FOOT ;  Surgeon: Alger Infield, MD;  Location: [facility] OR;  Service: Plastics;  Laterality: Left;   Social History   Tobacco Use   Smoking status: Former    Current packs/day: 0.00    Types: Cigarettes    Quit date: 09/07/2010    Years since quitting: 12.7   Smokeless tobacco: Never  Vaping Use   Vaping status: Never Used  Substance Use Topics   Alcohol use: No   Drug use: No   Family History  Problem Relation Age of Onset   Heart disease Father    Heart disease Brother    Allergies: Allergies as of 06/12/2023 - Review Complete 06/12/2023  Allergen Reaction Noted   Aspirin   09/10/2010   Nystatin  Itching 03/08/2017   Penicillins Rash 09/10/2010   Review of Systems: A complete ROS was negative except as per HPI.   OBJECTIVE:   Blood pressure (!) 142/56, pulse (!) 51, temperature (!) 97.4 F (36.3 C), temperature source Oral, resp. rate 10, SpO2 100%.  Physical Exam Constitutional:      Appearance: She is underweight.  HENT:     Head: Normocephalic and atraumatic.  Cardiovascular:     Rate and Rhythm: Normal rate and regular rhythm.  Pulmonary:     Effort: Pulmonary effort is normal.     Breath sounds: Normal breath sounds.  Abdominal:     General: Abdomen is flat. Bowel sounds are normal.     Tenderness: There is abdominal tenderness.  Neurological:     General: No focal deficit present.     Mental Status: She is alert.    Labs: CBC    Component Value Date/Time   WBC 5.6 06/12/2023 0848   RBC 3.55 (L) 06/12/2023 0848   HGB 10.9 (L) 06/12/2023 0848   HCT 32.7 (L) 06/12/2023 0848   PLT 159 06/12/2023 0848   MCV 92.1 06/12/2023 0848   MCH 30.7 06/12/2023 0848   MCHC 33.3 06/12/2023 0848   RDW 13.5 06/12/2023 0848   LYMPHSABS 1.2  06/12/2023 0848   MONOABS 0.9 06/12/2023 0848   EOSABS 0.1 06/12/2023 0848   BASOSABS 0.0 06/12/2023 0848    CMP     Component Value Date/Time   NA 126 (L) 06/12/2023 0848   NA 143 12/28/2017 1538   K 3.9 06/12/2023 0848   CL 92 (L) 06/12/2023 0848   CO2 26 06/12/2023 0848   GLUCOSE 104 (H) 06/12/2023 0848   BUN 9 06/12/2023 0848   BUN 11 12/28/2017 1538   CREATININE 0.79 06/12/2023 0848   CREATININE 0.60 03/14/2015 1514   CALCIUM  9.2 06/12/2023 0848   PROT 6.4 (L) 06/12/2023 0848   ALBUMIN  3.4 (L) 06/12/2023 0848   AST 30 06/12/2023 0848   ALT 25 06/12/2023 0848   ALKPHOS 74 06/12/2023 0848   BILITOT 0.6 06/12/2023 0848   GFRNONAA >60 06/12/2023 0848   GFRAA 85 12/28/2017 1538    Imaging: CT Abdomen Pelvis IMPRESSION: 1. Mildly dilated loops of small bowel are identified. These measure up to 3.1 cm in the right lower quadrant of the abdomen. No clear transition point identified. Findings are favored to represent enteritis versus ileus. Partial obstruction not excluded. 2. Moderate size hiatal hernia. Mid transverse colon extends into the hiatal hernia. The herniated portion of the mid transverse colon is decreased in caliber however there is a similar caliber of the colon proximal and distal to the hernia. 3. Unchanged appearance of well-circumscribed fluid density structure which arises off the medial head of pancreas measuring 2.1 x 0.9 cm. This may represent a side branch IPMN. 4. Mild circumferential wall thickening and enhancement of the urinary bladder appears similar to previous study. Correlate for any clinical signs or symptoms of cystitis.  ASSESSMENT & PLAN:   Assessment & Plan by Problem: Principal Problem:   Ileus (HCC)  Charlene Vincent is an 89 YO F with a PMH of Takotsubo cardiomyopathy, hyponatremia, hypothyroidism, chronic pain, hyperlipidemia, hypertension presenting to the ED with back pain and polyuria admitted for an ileus and UTI.   #Concern for  Ileus CT abdomen notable for mildly dilated loops of small bowel with no clear transition point concerning for enteritis versus ileus.  Given radiologic findings and abdominal tenderness, there is concern that this may be in the setting of ileus.  This may also be exacerbated by the patient's chronic use of opioids.  However, when assessing the patient, she noted that she passed a stool prior to us  seeing her.  She also denied any nausea/vomiting and any concerning for obstruction.  She appears to have been admitted for similar issue in 09/2021 for an ileus with constipation with CT imaging notable for fluid-filled loops of small bowel in the left abdomen and pelvis without any discrete transition point.  Will treat supportively with bowel rest at this time and continue to monitor. - N.p.o. except for meds - LR at 75 mL/h - F/U PT/OT  #UTI UA notable for large leukocytes with white blood cell count greater than 50.  However, nitrates were negative and CBC did not demonstrate a leukocytosis.  CT pelvis notable for mild circumferential wall thickening enhancement of the urinary bladder similar to prior.  Given previous UAs with similar findings, this may be contributing to her current presentation.  However, her polyuria and mild dysuria appears to be acute, so we will treat with ceftriaxone  at this time. Would consider transitioning to PO agent such as TMP-SMX as her signs/symptoms improve.  - Continue IV ceftriaxone  - F/U urine cultures  #Acute on chronic hyponatremia Ongoing issue with values in the 120s to low 130s.  On presentation, her sodium was decreased at 126.  This appears to be a chronic condition as previous notes and labs have been in the range of the mid 120s to lower 130s. She is asymptomatic.  May consider further evaluation through urine sodium and osmolality studies.  Chronic Conditions: #Takotsubo cardiomyopathy #Hx of STEMI (2016) Prior acute episodes in 2012 with recurrence in  2016.  Asymptomatic.  Last echo in 2016 notable for EF of 45 to 50%.  Current treatment regimen includes Coreg  3.125 mg twice daily. - Continue home regimen of Coreg  3.125 mg  twice daily  #Hypothyroidism Last TSH in December 2024 elevated at 8.569.  Current treatment regimen includes levothyroxine  112 mcg/day. - Continue home levothyroxine  112 mcg/day  #Chronic pain: Currently managed with oxycodone -acetaminophen  5-325 once per day.  Holding at this time with concerns that this may be contributing to her possible ileus.  #Hyperlipidemia: Last lipid panel in November 2024 notable for normal LDL of 29.  Currently managed with atorvastatin  80 mg/day.  #Hypertension: Stable on admission with systolic in the 130s to 140s.  Will continue home lisinopril  at this time. - Continue lisinopril  2.5 mg/day  Diet: NPO VTE: Enoxaparin  IVF: LR,75cc/hr Code: Full  Prior to Admission Living Arrangement: Home, living with son Anticipated Discharge Location: Home Barriers to Discharge: IV antibiotics  Dispo: Admit patient to Observation with expected length of stay less than 2 midnights.  Signed: Maxie Spaniel, MD Internal Medicine Resident PGY-1  06/12/2023, 1:57 PM

## 2023-06-12 NOTE — Hospital Course (Addendum)
#  Deconditioning Nutrition status upon presentation poor with low BMI at 18.  Suspected that her presentation and progressive weakness may have been in the context of declining functional status at home. Evaluated by PT/OT who had no further follow-up recommendations. Would recommend follow-up outpatient for further evaluation of assistance at home.   #Acute uncomplicated cystitis UA notable for large leukocytes with white blood cell count greater than 50.  However, nitrites were negative and CBC did not demonstrate a leukocytosis.  CT pelvis notable for mild circumferential wall thickening enhancement of the urinary bladder similar to prior.  Given previous UAs with similar findings, this may be contributing to her current presentation.  However, her polyuria and mild dysuria appears to be acute, so we treated with ceftriaxone .    #Chronic asymptomatic hyponatremia Ongoing issue with values in the 120s to low 130s.  On presentation, her sodium was decreased at 126.  Appeared to be a chronic condition as previous notes and labs have been in the range of the mid 120s to lower 130s. Urine osmol was low at 202.  Serum osmolality was low at 268.  Suspected that this was secondary to poor nutrition with lower concern for SIADH. Improved at discharge to 132 with salt tablet supplementation and fluids.   #Bowel changes on imaging On admission, CT abdomen notable for mildly dilated loops of small bowel with no clear transition point concerning for enteritis versus ileus. owever, when assessing the patient, she noted that she passed a stool prior to us  seeing her. She also denied any nausea/vomiting and any concerning for obstruction. She appears to have been admitted for similar issue in 09/2021 for an ileus with constipation with CT imaging notable for fluid-filled loops of small bowel in the left abdomen and pelvis without any discrete transition point.  Treated supportively with LR infusion and keeping patient  NPO and slowly progressing diet.  #Takotsubo cardiomyopathy #Hx of STEMI (2016) Prior acute episodes in 2012 with recurrence in 2016. Asymptomatic. Last echo in 2016 notable for EF of 45 to 50%. Continued home Coreg  3.125 mg twice daily    #Hypothyroidism Last TSH in December 2024 elevated at 8.569. Continued home levothyroxine  112 mcg/day   #Chronic pain Held home oxycodone -acetaminophen  5-325 once per day   #HTN Continued home lisinopril  2.5 mg/day

## 2023-06-12 NOTE — ED Notes (Signed)
RN assisted pt to restroom without incident  

## 2023-06-12 NOTE — Plan of Care (Signed)

## 2023-06-12 NOTE — ED Notes (Signed)
 The admitting team stated pt should remain NPO at this time. She can drink water only.  Pt belonging bag and purse on bed with pt.

## 2023-06-12 NOTE — ED Notes (Signed)
NT assisted pt to restroom.

## 2023-06-12 NOTE — ED Notes (Signed)
 RN assisted pt to restroom and attending ambulated pt back.

## 2023-06-12 NOTE — ED Notes (Signed)
 Pt and son keep asking when can she eat, see the doctor, and get the results from CT scan. RN informed pt the scan can take a few hours d/t shortage in the department. Once results are read EDP will discuss next steps with pt.   EDP secured chat

## 2023-06-12 NOTE — ED Provider Notes (Signed)
 Emergency Department Provider Note   I have reviewed the triage vital signs and the nursing notes.   HISTORY  Chief Complaint Back Pain and Leg Pain   HPI Charlene Vincent is a 89 y.o. female with past history reviewed below who presents to the emergency department for evaluation of lower back and some rectal discomfort.  Pain radiates down the legs, mostly on the right leg.  She points to her right buttock as the source of pain.  States has been going on for several days.  Son at bedside reports some complaint of dysuria as well.  No known fevers.  Patient denies any pain into her chest or shortness of breath.  Notes occasional abdominal pain but none currently.  She reports having bowel movements daily with no vomiting or diarrhea.    Past Medical History:  Diagnosis Date   Anemia    Arthritis    Cellulitis    Costochondritis    Headache    Hypothyroidism    Myocardial infarction [facility]) 2012, 2016   both Takotsubo   Pneumonia    Shingles    Takotsubo cardiomyopathy    a. 2012  b. LHC in 09/2014 nl cors, EF 30-35%>>45-50% by echo 11/2014   Tremor, essential     Review of Systems  Constitutional: No fever/chills Cardiovascular: Denies chest pain. Respiratory: Denies shortness of breath. Gastrointestinal: Positive abdominal pain.  No nausea, no vomiting.  No diarrhea.  No constipation. Genitourinary: Occasional dysuria. Musculoskeletal: Positive for back pain. Skin: Negative for rash. Neurological: Negative for headaches, focal weakness or numbness.  ____________________________________________   PHYSICAL EXAM:  VITAL SIGNS: ED Triage Vitals [06/12/23 0827]  Encounter Vitals Group     BP 123/71     Pulse Rate (!) 57     Resp 14     Temp 97.8 F (36.6 C)     Temp Source Oral     SpO2 100 %   Constitutional: Alert and oriented. Well appearing and in no acute distress. Eyes: Conjunctivae are normal.  Head: Atraumatic. Nose: No  congestion/rhinnorhea. Mouth/Throat: Mucous membranes are moist.   Neck: No stridor.   Cardiovascular: Normal rate, regular rhythm. Good peripheral circulation. Grossly normal heart sounds.  2+ DP pulses bilaterally. Respiratory: Normal respiratory effort.  No retractions. Lungs CTAB. Gastrointestinal: Soft and nontender. No distention.  Rectal exam performed with nurse chaperone at bedside.  No visible external hemorrhoids, fissures, perianal abscess.  Digital rectal exam without masses, gross blood, melena.  Musculoskeletal: No lower extremity tenderness nor edema. No gross deformities of extremities.  Normal range of motion of the bilateral hips and knees. Compartments are soft.  Neurologic:  Normal speech and language. No gross focal neurologic deficits are appreciated.  Skin:  Skin is warm, dry and intact. No rash noted.  ____________________________________________   LABS (all labs ordered are listed, but only abnormal results are displayed)  Labs Reviewed  URINE CULTURE - Abnormal; Notable for the following components:      Result Value   Culture   (*)    Value: <10,000 COLONIES/mL INSIGNIFICANT GROWTH Performed at [facility], [address] [place], [place] [address]    All other components within normal limits  COMPREHENSIVE METABOLIC PANEL WITH GFR - Abnormal; Notable for the following components:   Sodium 126 (*)    Chloride 92 (*)    Glucose, Bld 104 (*)    Total Protein 6.4 (*)    Albumin 3.4 (*)    All other  components within normal limits  LIPASE, BLOOD - Abnormal; Notable for the following components:   Lipase 59 (*)    All other components within normal limits  CBC WITH DIFFERENTIAL/PLATELET - Abnormal; Notable for the following components:   RBC 3.55 (*)    Hemoglobin 10.9 (*)    HCT 32.7 (*)    All other components within normal limits  URINALYSIS, ROUTINE W REFLEX MICROSCOPIC - Abnormal; Notable for the following components:   APPearance CLOUDY (*)     Leukocytes,Ua LARGE (*)    Bacteria, UA RARE (*)    All other components within normal limits  OSMOLALITY - Abnormal; Notable for the following components:   Osmolality 268 (*)    All other components within normal limits  OSMOLALITY, URINE - Abnormal; Notable for the following components:   Osmolality, Ur 202 (*)    All other components within normal limits  BASIC METABOLIC PANEL WITH GFR - Abnormal; Notable for the following components:   Sodium 128 (*)    Chloride 95 (*)    All other components within normal limits  CBC - Abnormal; Notable for the following components:   RBC 3.83 (*)    Hemoglobin 11.8 (*)    HCT 34.6 (*)    All other components within normal limits  BASIC METABOLIC PANEL WITH GFR - Abnormal; Notable for the following components:   Sodium 132 (*)    Calcium  8.6 (*)    All other components within normal limits  SODIUM, URINE, RANDOM  POC OCCULT BLOOD, ED   ____________________________________________   PROCEDURES  Procedure(s) performed:   Procedures  None  ____________________________________________   INITIAL IMPRESSION / ASSESSMENT AND PLAN / ED COURSE  Pertinent labs & imaging results that were available during my care of the patient were reviewed by me and considered in my medical decision making (see chart for details).   This patient is Presenting for Evaluation of back pain, which does require a range of treatment options, and is a complaint that involves a high risk of morbidity and mortality.  The Differential Diagnoses includes but is not exclusive to musculoskeletal back pain, renal colic, urinary tract infection, pyelonephritis, intra-abdominal causes of back pain, aortic aneurysm or dissection, cauda equina syndrome, sciatica, lumbar disc disease, thoracic disc disease, etc.   Critical Interventions-    Medications  iohexol  (OMNIPAQUE ) 350 MG/ML injection 75 mL (75 mLs Intravenous Contrast Given 06/12/23 1107)  cefTRIAXone   (ROCEPHIN ) 1 g in sodium chloride  0.9 % 100 mL IVPB (0 g Intravenous Stopped 06/12/23 1450)    Reassessment after intervention: symptoms improved.    I did obtain Additional Historical Information from son at bedside.  Clinical Laboratory Tests Ordered, included UA with leukocytes.  CBC without leukocytosis.  No acute kidney injury.  LFTs normal.  Radiologic Tests Ordered, included CT abdomen/pelvis. I independently interpreted the images and agree with radiology interpretation.   Cardiac Monitor Tracing which shows NSR.    Social Determinants of Health Risk patient is not an active smoker.   Medical Decision Making: Summary:  The patient presents emergency department valuation of mainly lower back and right gluteal pain radiating down the right leg.  Differential includes sciatica.  No evidence of critical limb ischemia, fracture, septic joint, compartment syndrome.  Plan for CT abdomen pelvis with report of rectal pain but no visible source for her discomfort.  Reevaluation with update and discussion with patient.  Suspect ileus clinically.  Plan for observation in the internal medicine teaching service.  I  have consulted them and they will accept the patient for admission.   Patient's presentation is most consistent with acute presentation with potential threat to life or bodily function.   Disposition: admit  ____________________________________________  FINAL CLINICAL IMPRESSION(S) / ED DIAGNOSES  Final diagnoses:  Ileus (HCC)  Generalized abdominal pain  Cystitis     Note:  This document was prepared using Dragon voice recognition software and may include unintentional dictation errors.  Abby Hocking, MD, [facility] Emergency [facility]    Bryse Blanchette, Shereen Dike, MD 06/23/23 [phone]

## 2023-06-12 NOTE — ED Notes (Signed)
 RN ambulated with pt to restroom without incident. Pt had dark stool smudge on brief. RN notified EDP to collect hemoccult sample.  Pt states she has an increase urgency to urinate. Pt didn't urinate but felt she did. NT will bladder scan pt.

## 2023-06-13 DIAGNOSIS — K567 Ileus, unspecified: Secondary | ICD-10-CM

## 2023-06-13 DIAGNOSIS — Z90710 Acquired absence of both cervix and uterus: Secondary | ICD-10-CM | POA: Diagnosis not present

## 2023-06-13 DIAGNOSIS — E871 Hypo-osmolality and hyponatremia: Secondary | ICD-10-CM | POA: Diagnosis present

## 2023-06-13 DIAGNOSIS — R54 Age-related physical debility: Secondary | ICD-10-CM | POA: Diagnosis present

## 2023-06-13 DIAGNOSIS — E039 Hypothyroidism, unspecified: Secondary | ICD-10-CM | POA: Diagnosis present

## 2023-06-13 DIAGNOSIS — I252 Old myocardial infarction: Secondary | ICD-10-CM | POA: Diagnosis not present

## 2023-06-13 DIAGNOSIS — I5022 Chronic systolic (congestive) heart failure: Secondary | ICD-10-CM | POA: Diagnosis present

## 2023-06-13 DIAGNOSIS — I255 Ischemic cardiomyopathy: Secondary | ICD-10-CM | POA: Diagnosis present

## 2023-06-13 DIAGNOSIS — K5909 Other constipation: Secondary | ICD-10-CM | POA: Diagnosis present

## 2023-06-13 DIAGNOSIS — Z681 Body mass index (BMI) 19 or less, adult: Secondary | ICD-10-CM | POA: Diagnosis not present

## 2023-06-13 DIAGNOSIS — I110 Hypertensive heart disease with heart failure: Secondary | ICD-10-CM | POA: Diagnosis present

## 2023-06-13 DIAGNOSIS — H9190 Unspecified hearing loss, unspecified ear: Secondary | ICD-10-CM | POA: Diagnosis present

## 2023-06-13 DIAGNOSIS — E785 Hyperlipidemia, unspecified: Secondary | ICD-10-CM | POA: Diagnosis present

## 2023-06-13 DIAGNOSIS — G3184 Mild cognitive impairment, so stated: Secondary | ICD-10-CM | POA: Diagnosis present

## 2023-06-13 DIAGNOSIS — Z87891 Personal history of nicotine dependence: Secondary | ICD-10-CM | POA: Diagnosis not present

## 2023-06-13 DIAGNOSIS — Z79899 Other long term (current) drug therapy: Secondary | ICD-10-CM | POA: Diagnosis not present

## 2023-06-13 DIAGNOSIS — N3000 Acute cystitis without hematuria: Secondary | ICD-10-CM | POA: Diagnosis present

## 2023-06-13 DIAGNOSIS — E440 Moderate protein-calorie malnutrition: Secondary | ICD-10-CM | POA: Diagnosis present

## 2023-06-13 DIAGNOSIS — K440 Diaphragmatic hernia with obstruction, without gangrene: Secondary | ICD-10-CM | POA: Diagnosis present

## 2023-06-13 DIAGNOSIS — K529 Noninfective gastroenteritis and colitis, unspecified: Secondary | ICD-10-CM | POA: Diagnosis present

## 2023-06-13 DIAGNOSIS — G250 Essential tremor: Secondary | ICD-10-CM | POA: Diagnosis present

## 2023-06-13 DIAGNOSIS — G8929 Other chronic pain: Secondary | ICD-10-CM | POA: Diagnosis present

## 2023-06-13 DIAGNOSIS — Z8249 Family history of ischemic heart disease and other diseases of the circulatory system: Secondary | ICD-10-CM | POA: Diagnosis not present

## 2023-06-13 DIAGNOSIS — Z79891 Long term (current) use of opiate analgesic: Secondary | ICD-10-CM | POA: Diagnosis not present

## 2023-06-13 LAB — URINE CULTURE: Culture: <10000 — AB

## 2023-06-13 LAB — BASIC METABOLIC PANEL WITH GFR
Anion gap: 9 (ref 5–15)
BUN: 8 mg/dL (ref 8–23)
CO2: 24 mmol/L (ref 22–32)
Calcium: 9.2 mg/dL (ref 8.9–10.3)
Chloride: 95 mmol/L — ABNORMAL LOW (ref 98–111)
Creatinine, Ser: 0.66 mg/dL (ref 0.44–1.00)
GFR, Estimated: >60 mL/min (ref >60)
Glucose, Bld: 88 mg/dL (ref 70–99)
Potassium: 3.9 mmol/L (ref 3.5–5.1)
Sodium: 128 mmol/L — ABNORMAL LOW (ref 135–145)

## 2023-06-13 LAB — CBC
HCT: 34.6 % — ABNORMAL LOW (ref 36.0–46.0)
Hemoglobin: 11.8 g/dL — ABNORMAL LOW (ref 12.0–15.0)
MCH: 30.8 pg (ref 26.0–34.0)
MCHC: 34.1 g/dL (ref 30.0–36.0)
MCV: 90.3 fL (ref 80.0–100.0)
Platelets: 161 10*3/uL (ref 150–400)
RBC: 3.83 MIL/uL — ABNORMAL LOW (ref 3.87–5.11)
RDW: 13.5 % (ref 11.5–15.5)
WBC: 5 10*3/uL (ref 4.0–10.5)
nRBC: 0 % (ref 0.0–0.2)

## 2023-06-13 MED ORDER — SODIUM CHLORIDE 1 G PO TABS
1.0000 g | ORAL_TABLET | Freq: Two times a day (BID) | ORAL | Status: DC
  Administered 2023-06-13 – 2023-06-14 (×3): 1 g via ORAL
  Filled 2023-06-13 (×4): qty 1

## 2023-06-13 MED ORDER — POLYETHYLENE GLYCOL 3350 17 G PO PACK
17.0000 g | PACK | Freq: Three times a day (TID) | ORAL | Status: DC
  Administered 2023-06-13 – 2023-06-14 (×4): 17 g via ORAL
  Filled 2023-06-13 (×5): qty 1

## 2023-06-13 MED ORDER — SENNOSIDES-DOCUSATE SODIUM 8.6-50 MG PO TABS
2.0000 | ORAL_TABLET | Freq: Two times a day (BID) | ORAL | Status: DC
  Administered 2023-06-13 – 2023-06-14 (×3): 2 via ORAL
  Filled 2023-06-13 (×3): qty 2

## 2023-06-13 MED ORDER — ACETAMINOPHEN 325 MG PO TABS
650.0000 mg | ORAL_TABLET | Freq: Four times a day (QID) | ORAL | Status: DC | PRN
  Administered 2023-06-13 (×2): 650 mg via ORAL
  Filled 2023-06-13 (×2): qty 2

## 2023-06-13 NOTE — Evaluation (Signed)
 Physical Therapy Evaluation Patient Details Name: ALINDA EGOLF MRN: [id number] DOB: 1934/11/19 Today's Date: 06/13/2023  History of Present Illness  89 year old admitted 06/12/23 with bowel changes on imaging (mildly dilated small bowel with possible ileus).  PMH significant for frailty of advanced age, ischemic cardiomyopathy, moderate malnutrition, chronic hyponatremia.  Clinical Impression  Patient presents with mild deficits in gait and mobility mostly related to generalized weakness.  Patient close to baseline, requiring contact guard assist for gait and transfers.  Patient will have assistance at home upon discharge.  Recommend patient ambulate with nursing, family and mobility specialists while here in hospital.  No further PT needs identified.  Will sign off.         If plan is discharge home, recommend the following: A little help with walking and/or transfers;A little help with bathing/dressing/bathroom;Assistance with cooking/housework;Supervision due to cognitive status;Assist for transportation;Direct supervision/assist for medications management   Can travel by private vehicle        Equipment Recommendations None recommended by PT  Recommendations for Other Services       Functional Status Assessment Patient has had a recent decline in their functional status and demonstrates the ability to make significant improvements in function in a reasonable and predictable amount of time.     Precautions / Restrictions Precautions Precautions: Fall Restrictions Weight Bearing Restrictions Per Provider Order: No      Mobility  Bed Mobility Overal bed mobility: Needs Assistance Bed Mobility: Supine to Sit     Supine to sit: Supervision     General bed mobility comments: required increased time and verbal cueing    Transfers Overall transfer level: Needs assistance Equipment used: Rolling walker (2 wheels) Transfers: Sit to/from Stand Sit to Stand: Contact guard  assist                Ambulation/Gait Ambulation/Gait assistance: Contact guard assist Gait Distance (Feet): 100 Feet Assistive device: Rolling walker (2 wheels) Gait Pattern/deviations: Step-through pattern Gait velocity: decreased        Stairs            Wheelchair Mobility     Tilt Bed    Modified Rankin (Stroke Patients Only)       Balance Overall balance assessment: Needs assistance Sitting-balance support: No upper extremity supported, Feet supported Sitting balance-Leahy Scale: Good     Standing balance support: Bilateral upper extremity supported, During functional activity, Reliant on assistive device for balance Standing balance-Leahy Scale: Poor                               Pertinent Vitals/Pain Pain Assessment Pain Assessment: No/denies pain    Home Living Family/patient expects to be discharged to:: Private residence Living Arrangements: Children;Non-relatives/Friends Available Help at Discharge: Family;Friend(s);Available 24 hours/day Type of Home: Mobile home         Home Layout: One level Home Equipment: Agricultural consultant (2 wheels)      Prior Function Prior Level of Function : Independent/Modified Independent             Mobility Comments: ambulates with RW - reports she ambulates "everyday", inside and outside ADLs Comments: assisted by family     Extremity/Trunk Assessment   Upper Extremity Assessment Upper Extremity Assessment: RUE deficits/detail;LUE deficits/detail RUE Deficits / Details: shoulder ROM severly limited; elbow and hand ROM appears intact (grossly assessed) LUE Deficits / Details: shoulder ROM severly limited; elbow and hand ROM appears intact (grossly assessed)  Lower Extremity Assessment Lower Extremity Assessment: Generalized weakness    Cervical / Trunk Assessment Cervical / Trunk Assessment: Kyphotic  Communication   Communication Communication: Impaired Factors Affecting  Communication: Hearing impaired    Cognition Arousal: Alert Behavior During Therapy: WFL for tasks assessed/performed   PT - Cognitive impairments: Orientation   Orientation impairments: Time                   PT - Cognition Comments: when looking at [facility] roof (which is white) asked "is that snow?", answered year as 1987 Following commands: Intact       Cueing Cueing Techniques: Verbal cues     General Comments      Exercises     Assessment/Plan    PT Assessment Patient does not need any further PT services  PT Problem List         PT Treatment Interventions      PT Goals (Current goals can be found in the Care Plan section)  Acute Rehab PT Goals PT Goal Formulation: All assessment and education complete, DC therapy    Frequency       Co-evaluation               AM-PAC PT "6 Clicks" Mobility  Outcome Measure Help needed turning from your back to your side while in a flat bed without using bedrails?: None Help needed moving from lying on your back to sitting on the side of a flat bed without using bedrails?: None Help needed moving to and from a bed to a chair (including a wheelchair)?: A Little Help needed standing up from a chair using your arms (e.g., wheelchair or bedside chair)?: A Little Help needed to walk in hospital room?: A Little Help needed climbing 3-5 steps with a railing? : A Little 6 Click Score: 20    End of Session Equipment Utilized During Treatment: Gait belt Activity Tolerance: Patient tolerated treatment well Patient left: in chair;with call bell/phone within reach;with chair alarm set;with family/visitor present Nurse Communication: Mobility status PT Visit Diagnosis: Other abnormalities of gait and mobility (R26.89);Muscle weakness (generalized) (M62.81)    Time: 0981-1914 PT Time Calculation (min) (ACUTE ONLY): 18 min   Charges:   PT Evaluation $PT Eval Moderate Complexity: 1 Mod   PT General Charges $$ ACUTE PT  VISIT: 1 Visit         06/13/2023 Sammi Crick, PT Acute Rehabilitation Services Office:  [phone]   Norene Beards 06/13/2023, 9:53 AM

## 2023-06-13 NOTE — Progress Notes (Signed)
 HD#0 SUBJECTIVE:  Patient Summary: Charlene Vincent is an 89 YO F with a PMH of Takotsubo cardiomyopathy, hyponatremia, hypothyroidism, chronic pain, hyperlipidemia, and hypertension presenting to the ED with back pain and polyuria admitted for deconditioning.   Overnight Events: NAEO  Interim History: Patient was evaluated at bedside.  Continues to endorse generalized pain most prominent in her lower back.  She also continues to have frequent urination.  Denies any fever, chest pain, or other signs or symptoms.  OBJECTIVE:  Vital Signs: Vitals:   06/12/23 2039 06/12/23 2050 06/13/23 0039 06/13/23 0506  BP: 130/78 (!) 143/66 (!) 146/68 122/72  Pulse: 68 66 63 89  Resp: 16 16 16 18   Temp: 97.7 F (36.5 C) 98 F (36.7 C) 98.2 F (36.8 C) 98.5 F (36.9 C)  TempSrc: Oral   Oral  SpO2: 98% 99% 99% 99%  Weight:      Height:       Supplemental O2: Room Air SpO2: 99 %  Filed Weights   06/12/23 1603  Weight: 49 kg    Intake/Output Summary (Last 24 hours) at 06/13/2023 0755 Last data filed at 06/12/2023 1843 Gross per 24 hour  Intake 271.21 ml  Output 250 ml  Net 21.21 ml   Net IO Since Admission: 21.21 mL [06/13/23 0755]  Physical Exam: Physical Exam Constitutional:      Appearance: She is underweight.  HENT:     Head: Normocephalic and atraumatic.  Cardiovascular:     Rate and Rhythm: Normal rate and regular rhythm.  Pulmonary:     Effort: Pulmonary effort is normal.     Breath sounds: Normal breath sounds.  Abdominal:     General: Abdomen is flat. Bowel sounds are normal.     Tenderness: There is abdominal tenderness in the suprapubic area.  Neurological:     General: No focal deficit present.     Mental Status: Mental status is at baseline.  Psychiatric:        Mood and Affect: Mood normal.        Behavior: Behavior normal. Behavior is cooperative.    CBC    Component Value Date/Time   WBC 5.0 06/13/2023 0316   RBC 3.83 (L) 06/13/2023 0316   HGB 11.8  (L) 06/13/2023 0316   HCT 34.6 (L) 06/13/2023 0316   PLT 161 06/13/2023 0316   MCV 90.3 06/13/2023 0316   MCH 30.8 06/13/2023 0316   MCHC 34.1 06/13/2023 0316   RDW 13.5 06/13/2023 0316   LYMPHSABS 1.2 06/12/2023 0848   MONOABS 0.9 06/12/2023 0848   EOSABS 0.1 06/12/2023 0848   BASOSABS 0.0 06/12/2023 0848      Latest Ref Rng & Units 06/13/2023    3:16 AM 06/12/2023    8:48 AM 01/24/2023    5:03 PM  BMP  Glucose 70 - 99 mg/dL 88  960  94   BUN 8 - 23 mg/dL 8  9  12    Creatinine 0.44 - 1.00 mg/dL 4.54  0.98  1.19   Sodium 135 - 145 mmol/L 128  126  129   Potassium 3.5 - 5.1 mmol/L 3.9  3.9  4.2   Chloride 98 - 111 mmol/L 95  92  98   CO2 22 - 32 mmol/L 24  26  22    Calcium  8.9 - 10.3 mg/dL 9.2  9.2  8.6     ASSESSMENT/PLAN:  Assessment: Principal Problem:   Ileus (HCC)  Plan: #Deconditioning Nutrition status upon presentation poor with low BMI at  18.  Suspect that her current presentation and progressive weakness may be in the context of declining functional status at home.  Will evaluate with PT/OT today for possible further assistance in disposition recommendations.  Will also follow-up with nutrition today for recommendations to help improve her protein supplementation and caloric intake. - Follow-up nutrition recommendations - Follow-up PT/OT  #Uncomplicated Cystitis  Continues to endorse suprapubic tenderness with polyuria. Remains afebrile with no leukocytosis. Given that she is symptomatic with bacteriuria on her UA and lower abdominal tenderness, will treat for 5 days. - Trend fever curve - Continue ceftriaxone   #Chronic Asymptomatic Hyponatremia Urine osmol low at 202.  Serum osmolality low at 268.  Suspect that this is secondary to poor nutrition.  Low concern for SIADH.  She also appears euvolemic on assessment today.  Slight improvement this morning after IV fluids as sodium is now 128.  Will supplement with salt tablets today with expected improvement upon  advancement in diet. - Trend sodium - Encourage p.o. feeding - Begin salt tablets  #Bowel Changes on Imaging Despite mild small bowel distention noted on imaging, patient does not appear to clinically have ileus given she denies any nausea/vomiting and passed a stool yesterday.  This could be secondary to chronic constipation noted on previous CT scans.  Will continue to treat supportively with plans to advance her diet today.  Will also begin bowel regimen in the meantime. - Begin soft diet - Begin MiraLAX  17 g 3 times daily and senna 2 tablets twice daily  Chronic Conditions:  #Takotsubo cardiomyopathy/Hx of STEMI (2016): Continue Coreg  3.125 mg twice daily   #Hypothyroidism: Continue home levothyroxine  112 mcg/day  #Chronic pain: Holding home oxycodone -acetaminophen  5-325 once per day  #HTN: Continue home lisinopril  2.5 mg/day  Best Practice: Diet: Soft IVF: Fluids: None, Rate: None VTE: enoxaparin  (LOVENOX ) injection 40 mg Start: 06/12/23 1430 Code: Full AB: CTX Therapy Recs: Pending, DME: other pending DISPO: Anticipated discharge  TBD  to  TBD  pending  PT/OT evaluation .  Signature: Maxie Spaniel, MD Internal Medicine Resident, PGY-1 [facility]  Pager: [phone]  Please contact the on call pager after 5 pm and on weekends at [phone].

## 2023-06-13 NOTE — Care Management Obs Status (Signed)
 MEDICARE OBSERVATION STATUS NOTIFICATION   Patient Details  Name: Charlene Vincent MRN: [id number] Date of Birth: 09-Dec-1934   Medicare Observation Status Notification Given:  Yes    Jannine Meo, RN 06/13/2023, 9:48 AM

## 2023-06-13 NOTE — Progress Notes (Addendum)
 Notified provider of a 10/10 pain in legs and arms. Provider placed a new med order. Also made the provider aware that the Pt suddenly had burning and dysuria upon urination. There was an order for placement of a purewick and that was placed as well.

## 2023-06-13 NOTE — Plan of Care (Signed)

## 2023-06-14 LAB — BASIC METABOLIC PANEL WITH GFR
Anion gap: 10 (ref 5–15)
BUN: 9 mg/dL (ref 8–23)
CO2: 24 mmol/L (ref 22–32)
Calcium: 8.6 mg/dL — ABNORMAL LOW (ref 8.9–10.3)
Chloride: 98 mmol/L (ref 98–111)
Creatinine, Ser: 0.72 mg/dL (ref 0.44–1.00)
GFR, Estimated: >60 mL/min (ref >60)
Glucose, Bld: 94 mg/dL (ref 70–99)
Potassium: 3.8 mmol/L (ref 3.5–5.1)
Sodium: 132 mmol/L — ABNORMAL LOW (ref 135–145)

## 2023-06-14 MED ORDER — ENSURE ENLIVE PO LIQD: 237.0000 mL | Freq: Two times a day (BID) | ORAL | Status: DC

## 2023-06-14 MED ORDER — ADULT MULTIVITAMIN W/MINERALS CH: 1.0000 | ORAL_TABLET | Freq: Every day | ORAL | Status: DC

## 2023-06-14 NOTE — Discharge Instructions (Signed)
Nutrition Post Hospital Stay °Proper nutrition can help your body recover from illness and injury.   °Foods and beverages high in protein, vitamins, and minerals help rebuild muscle loss, promote healing, & reduce fall risk.  ° °•In addition to eating healthy foods, a nutrition shake is an easy, delicious way to get the nutrition you need during and after your hospital stay ° °It is recommended that you continue to drink 2 bottles per day of:       Ensure Plus for at least 1 month (30 days) after your hospital stay  ° °Tips for adding a nutrition shake into your routine: °As allowed, drink one with vitamins or medications instead of water or juice °Enjoy one as a tasty mid-morning or afternoon snack °Drink cold or make a milkshake out of it °Drink one instead of milk with cereal or snacks °Use as a coffee creamer °  °Available at the following grocery stores and pharmacies:           °* [employer] * [employer] * [employer]  °* [employer]          * [employer] * [employer]  °* [facility]      * [employer]  * [employer]   °* [facility]  * [employer]   °* [facility] [phone]  °          °For COUPONS visit: www.ensure.com/join or www.boost.com/members/sign-up  ° °Suggested Substitutions °Ensure Plus = Boost Plus = Carnation Breakfast Essentials = Boost Compact °Ensure Active Clear = Boost Breeze °Glucerna Shake = Boost Glucose Control = Carnation Breakfast Essentials SUGAR FREE ° °  °

## 2023-06-14 NOTE — Plan of Care (Signed)

## 2023-06-14 NOTE — Discharge Summary (Signed)
 Name: Charlene Vincent MRN: [id number] DOB: June 07, 1934 89 y.o. PCP: Lory Rough., PA-C  Date of Admission: 06/12/2023  8:20 AM Date of Discharge:  06/14/23 Attending Physician: Dr.  Alwin Baars  DISCHARGE DIAGNOSIS:  Primary Problem: Ileus [facility])   Hospital Problems: Principal Problem:   Ileus (HCC) Active Problems:   Hyponatremia   Urinary tract infection   Chronic HFrEF (heart failure with reduced ejection fraction) (HCC)   Hiatal hernia   Moderate malnutrition (HCC)    DISCHARGE MEDICATIONS:   Allergies as of 06/14/2023       Reactions   Aspirin     But taking low dose therapy without problems.   Nystatin  Itching   She said when she used the cream it made symptoms worse    Penicillins Rash        Medication List     TAKE these medications    acetaminophen  500 MG tablet Commonly known as: TYLENOL  Take 1,000 mg by mouth every 6 (six) hours.   atorvastatin  80 MG tablet Commonly known as: LIPITOR  Take 80 mg by mouth daily.   carvedilol  3.125 MG tablet Commonly known as: COREG  Take 1 tablet (3.125 mg total) by mouth 2 (two) times daily with a meal.   clotrimazole  1 % vaginal cream Commonly known as: GYNE-LOTRIMIN  Place 1 Applicatorful vaginally at bedtime.   docusate sodium  100 MG capsule Commonly known as: COLACE Take 1 capsule (100 mg total) by mouth 2 (two) times daily.   ferrous sulfate  325 (65 FE) MG EC tablet Take 1 tablet by mouth 2 (two) times daily.   fluticasone  50 MCG/ACT nasal spray Commonly known as: FLONASE  Place 1-2 sprays into both nostrils daily.   levothyroxine  112 MCG tablet Commonly known as: SYNTHROID  Take 1 tablet (112 mcg total) by mouth daily.   lisinopril  2.5 MG tablet Commonly known as: ZESTRIL  Take 1 tablet (2.5 mg total) by mouth daily.   mupirocin  ointment 2 % Commonly known as: BACTROBAN  Apply 1 Application topically 3 (three) times daily.   oxyCODONE -acetaminophen  5-325 MG tablet Commonly known as:  PERCOCET/ROXICET Take 0.5-1 tablets by mouth as needed for moderate pain.   oxyCODONE -acetaminophen  5-325 MG tablet Commonly known as: Percocet Take 1 tablet by mouth every 6 (six) hours as needed.   pantoprazole  20 MG tablet Commonly known as: Protonix  Take 1 tablet (20 mg total) by mouth daily.   polyethylene glycol 17 g packet Commonly known as: MIRALAX  / GLYCOLAX  Take 17 g by mouth daily.        DISPOSITION AND FOLLOW-UP:  CharleneCharlene Vincent was discharged from [facility] in Stable condition. At the hospital follow up visit please address:  Follow-up Recommendations: Consults: None Labs: Basic Metabolic Profile Studies: None Medications: None    HOSPITAL COURSE:  Patient Summary: #Deconditioning Nutrition status upon presentation poor with low BMI at 18.  Suspected that her presentation and progressive weakness may have been in the context of declining functional status at home. Evaluated by PT/OT who had no further follow-up recommendations. Would recommend follow-up outpatient for further evaluation of assistance at home.   #Acute uncomplicated cystitis UA notable for large leukocytes with white blood cell count greater than 50.  However, nitrites were negative and CBC did not demonstrate a leukocytosis.  CT pelvis notable for mild circumferential wall thickening enhancement of the urinary bladder similar to prior.  Given previous UAs with similar findings, this may be contributing to her current presentation.  However, her polyuria and mild dysuria appears to  be acute, so we treated with ceftriaxone .    #Chronic asymptomatic hyponatremia Ongoing issue with values in the 120s to low 130s.  On presentation, her sodium was decreased at 126.  Appeared to be a chronic condition as previous notes and labs have been in the range of the mid 120s to lower 130s. Urine osmol was low at 202.  Serum osmolality was low at 268.  Suspected that this was secondary to  poor nutrition with lower concern for SIADH. Improved at discharge to 132 with salt tablet supplementation and fluids.   #Bowel changes on imaging On admission, CT abdomen notable for mildly dilated loops of small bowel with no clear transition point concerning for enteritis versus ileus. owever, when assessing the patient, she noted that she passed a stool prior to us  seeing her. She also denied any nausea/vomiting and any concerning for obstruction. She appears to have been admitted for similar issue in 09/2021 for an ileus with constipation with CT imaging notable for fluid-filled loops of small bowel in the left abdomen and pelvis without any discrete transition point.  Treated supportively with LR infusion and keeping patient NPO and slowly progressing diet.  #Takotsubo cardiomyopathy #Hx of STEMI (2016) Prior acute episodes in 2012 with recurrence in 2016. Asymptomatic. Last echo in 2016 notable for EF of 45 to 50%. Continued home Coreg  3.125 mg twice daily    #Hypothyroidism Last TSH in December 2024 elevated at 8.569. Continued home levothyroxine  112 mcg/day   #Chronic pain Held home oxycodone -acetaminophen  5-325 once per day   #HTN Continued home lisinopril  2.5 mg/day   DISCHARGE INSTRUCTIONS:   Discharge Instructions     Call MD for:  difficulty breathing, headache or visual disturbances   Complete by: As directed    Call MD for:  extreme fatigue   Complete by: As directed    Call MD for:  hives   Complete by: As directed    Call MD for:  persistant dizziness or light-headedness   Complete by: As directed    Call MD for:  persistant nausea and vomiting   Complete by: As directed    Call MD for:  redness, tenderness, or signs of infection (pain, swelling, redness, odor or green/yellow discharge around incision site)   Complete by: As directed    Call MD for:  severe uncontrolled pain   Complete by: As directed    Call MD for:  temperature >100.4   Complete by: As  directed    Diet - low sodium heart healthy   Complete by: As directed    Discharge instructions   Complete by: As directed    Charlene Vincent,  You were hospitalized for weakness and a UTI. You were started on antibiotics to improve your UTI symptoms. You were also seen by physical therapy and occupational therapy who have no further recommendations at this time. At this time, we feel that you are safe to go home.   Please continue taking your medications as prescribed. In the next week, please also schedule a hospital follow-up with your primary care provider Arvie Latus, Lynett Sarah., PA-C) in the next week. We are glad that you are feeling better!   Increase activity slowly   Complete by: As directed        SUBJECTIVE:  Patient was evaluated bedside.  She was eating her breakfast and had no acute concerns.  Denied any back pain, chest pain, trouble breathing, or other signs or symptoms. Discharge Vitals:   BP (!) 134/96 (  BP Location: Left Arm)   Pulse 71   Temp 97.8 F (36.6 C)   Resp 16   Ht 5\' 5"  (1.651 m)   Wt 49 kg   SpO2 98%   BMI 17.98 kg/m   OBJECTIVE:  Physical Exam Constitutional:      Appearance: Normal appearance.  HENT:     Head: Normocephalic and atraumatic.  Cardiovascular:     Rate and Rhythm: Normal rate and regular rhythm.  Pulmonary:     Effort: Pulmonary effort is normal.     Breath sounds: Normal breath sounds.  Abdominal:     General: Abdomen is flat. Bowel sounds are normal.     Palpations: Abdomen is soft.  Neurological:     General: No focal deficit present.     Mental Status: She is alert and oriented to person, place, and time.  Psychiatric:        Mood and Affect: Mood normal.        Behavior: Behavior normal.     Pertinent Labs, Studies, and Procedures:     Latest Ref Rng & Units 06/13/2023    3:16 AM 06/12/2023    8:48 AM 01/24/2023    5:03 PM  CBC  WBC 4.0 - 10.5 K/uL 5.0  5.6  5.2   Hemoglobin 12.0 - 15.0 g/dL 04.5  40.9  81.1    Hematocrit 36.0 - 46.0 % 34.6  32.7  34.6   Platelets 150 - 400 K/uL 161  159  194        Latest Ref Rng & Units 06/14/2023    7:05 AM 06/13/2023    3:16 AM 06/12/2023    8:48 AM  CMP  Glucose 70 - 99 mg/dL 94  88  914   BUN 8 - 23 mg/dL 9  8  9    Creatinine 0.44 - 1.00 mg/dL 7.82  9.56  2.13   Sodium 135 - 145 mmol/L 132  128  126   Potassium 3.5 - 5.1 mmol/L 3.8  3.9  3.9   Chloride 98 - 111 mmol/L 98  95  92   CO2 22 - 32 mmol/L 24  24  26    Calcium  8.9 - 10.3 mg/dL 8.6  9.2  9.2   Total Protein 6.5 - 8.1 g/dL   6.4   Total Bilirubin 0.0 - 1.2 mg/dL   0.6   Alkaline Phos 38 - 126 U/L   74   AST 15 - 41 U/L   30   ALT 0 - 44 U/L   25     CT ABDOMEN PELVIS W CONTRAST Result Date: 06/12/2023 CLINICAL DATA:  Abdominal pain, acute. EXAM: CT ABDOMEN AND PELVIS WITH CONTRAST TECHNIQUE: Multidetector CT imaging of the abdomen and pelvis was performed using the standard protocol following bolus administration of intravenous contrast. RADIATION DOSE REDUCTION: This exam was performed according to the departmental dose-optimization program which includes automated exposure control, adjustment of the mA and/or kV according to patient size and/or use of iterative reconstruction technique. CONTRAST:  75mL OMNIPAQUE  IOHEXOL  350 MG/ML SOLN COMPARISON:  10/18/2021 FINDINGS: Lower chest: No pleural effusion or consolidative change. Hepatobiliary: Unchanged appearance of small hemangioma along the dome of the right lobe measuring 9 mm, image 10/3. Gallbladder is normal. No bile duct dilatation. Pancreas: No pancreatic inflammation. No main duct dilatation. Unchanged appearance of well-circumscribed fluid density structure which arises off the medial head of pancreas measuring 2.1 x 0.9 cm, image 69/6. Spleen: Normal in size without focal abnormality.  Adrenals/Urinary Tract: Normal adrenal glands. No nephrolithiasis, hydronephrosis or mass. Mild circumferential wall thickening and enhancement of the urinary  bladder appears similar to previous study. No focal bladder abnormality noted. Stomach/Bowel: Moderate size hiatal hernia. Stomach appears nondistended. The appendix is visualized and appears normal. Mildly dilated loops of small bowel are identified. These measure up to 3.1 cm in the right lower quadrant of the abdomen. No clear transition point identified. Moderate stool burden identified within the colon. No bowel wall thickening or inflammation identified. No discrete transition point to suggest prominent fluid filled loops of small bowel are noted measuring up to 3.1 cm. Mid transverse colon extends into the hiatal hernia. The herniated portion of the mid transverse colon is decreased in caliber, however there is a similar caliber of the colon proximal and distal to the hernia. Vascular/Lymphatic: Aortic atherosclerosis. No aneurysm. The upper abdominal vascularity is patent. No signs of abdominopelvic adenopathy. Reproductive: Status post hysterectomy. No adnexal masses. Other: There is no free fluid or fluid collections. No signs of pneumoperitoneum. Musculoskeletal: Thoracolumbar scoliosis deformity. Osteopenia. Multilevel lumbar degenerative disc disease. IMPRESSION: 1. Mildly dilated loops of small bowel are identified. These measure up to 3.1 cm in the right lower quadrant of the abdomen. No clear transition point identified. Findings are favored to represent enteritis versus ileus. Partial obstruction not excluded. 2. Moderate size hiatal hernia. Mid transverse colon extends into the hiatal hernia. The herniated portion of the mid transverse colon is decreased in caliber however there is a similar caliber of the colon proximal and distal to the hernia. 3. Unchanged appearance of well-circumscribed fluid density structure which arises off the medial head of pancreas measuring 2.1 x 0.9 cm. This may represent a side branch IPMN. 4. Mild circumferential wall thickening and enhancement of the urinary bladder  appears similar to previous study. Correlate for any clinical signs or symptoms of cystitis. 5.  Aortic Atherosclerosis (ICD10-I70.0). Electronically Signed   By: Kimberley Penman M.D.   On: 06/12/2023 12:53     Signed: Maxie Spaniel, MD Internal Medicine Resident, PGY-1 [facility]  Pager: [phone]

## 2023-06-14 NOTE — Progress Notes (Signed)
 Shannan Dart to be D/C'd  per MD order.  Discussed with the patient and all questions fully answered.  VSS, Skin clean, dry and intact without evidence of skin break down, no evidence of skin tears noted.  IV catheter discontinued intact. Site without signs and symptoms of complications. Dressing and pressure applied.  An After Visit Summary was printed and given to the patient. Patient in for transport to discharge lounge.  D/c education completed with patient/family including follow up instructions, medication list, d/c activities limitations if indicated, with other d/c instructions as indicated by MD - patient able to verbalize understanding, all questions fully answered.   Patient instructed to return to ED, call 911, or call MD for any changes in condition.   Patient to be escorted via WC, and D/C home via private auto.

## 2023-06-14 NOTE — Progress Notes (Signed)
 Initial Nutrition Assessment  DOCUMENTATION CODES:   Underweight  INTERVENTION:   -Ensure Enlive po BID, each supplement provides 350 kcal and 20 grams of protein.  -MVI with minerals daily -Continue soft diet  NUTRITION DIAGNOSIS:   Increased nutrient needs related to acute illness as evidenced by estimated needs.  GOAL:   Patient will meet greater than or equal to 90% of their needs  MONITOR:   PO intake, Supplement acceptance, Diet advancement  REASON FOR ASSESSMENT:   Consult Assessment of nutrition requirement/status  ASSESSMENT:   Pt with a PMH of Takotsubo cardiomyopathy, hyponatremia, hypothyroidism, chronic pain, hyperlipidemia, hypertension presenting with back pain and polyuria admitted for an ileus and UTI.  Pt admitted with ileus and UTI.   4/19- advanced to clear liquids 4/20- advanced to soft diet   Reviewed I/O's: +576 ml x 24 hours and +598 since admission  UOP: 250 ml x 24 hours  Pt unavailable at time of visit. Attempted to speak with pt via call to hospital room phone, however, unable to reach. RD unable to obtain further nutrition-related history or complete nutrition-focused physical exam at this time.    Per MD notes, concern for sarcopenia and malnutrition.   Pt currently on a soft diet. Noted meal completions 20-100%. Case discussed with RN, pt consumed 100% of her breakfast this morning.   Reviewed wt hx; pt has experienced a 1.4% wt loss over the past year, which is not significant for time frame.   Pt with increased nutritional needs and would benefit from addition of oral nutrition supplements.   Per MD notes, plan to d/c home today.   Medications reviewed and include protonix , miralax , senokot, rocephin , and sodium chloride .   Lab Results  Component Value Date   HGBA1C 5.6 09/10/2010   PTA DM medications are none.   Labs reviewed: Na: 132, CBGS: 87 (inpatient orders for glycemic control are none).    Diet Order:   Diet  Order             Diet - low sodium heart healthy           DIET SOFT Fluid consistency: Thin  Diet effective now                   EDUCATION NEEDS:   No education needs have been identified at this time  Skin:  Skin Assessment: Skin Integrity Issues: Skin Integrity Issues:: Other (Comment) Other: skin tear to lt arm  Last BM:  06/12/23 (type 4)  Height:   Ht Readings from Last 1 Encounters:  06/12/23 5\' 5"  (1.651 m)    Weight:   Wt Readings from Last 1 Encounters:  06/12/23 49 kg    Ideal Body Weight:  56.8 kg  BMI:  Body mass index is 17.98 kg/m.  Estimated Nutritional Needs:   Kcal:  1500-1700  Protein:  75-90 grams  Fluid:  > 1.5 L    Herschel Lords, RD, LDN, CDCES Registered Dietitian III Certified Diabetes Care and Education Specialist If unable to reach this RD, please use "RD Inpatient" group chat on secure chat between hours of 8am-4 pm daily

## 2023-08-11 IMAGING — DX DG TIBIA/FIBULA 2V*R*
4 series · 4 of 4 positions shown · non-contrast
Comparison: 05/03/2016 ankle films.

CLINICAL DATA: Trauma last week.  Redness.

EXAM:
RIGHT TIBIA AND FIBULA - 2 VIEW

[tibia ap (1 of 2)]
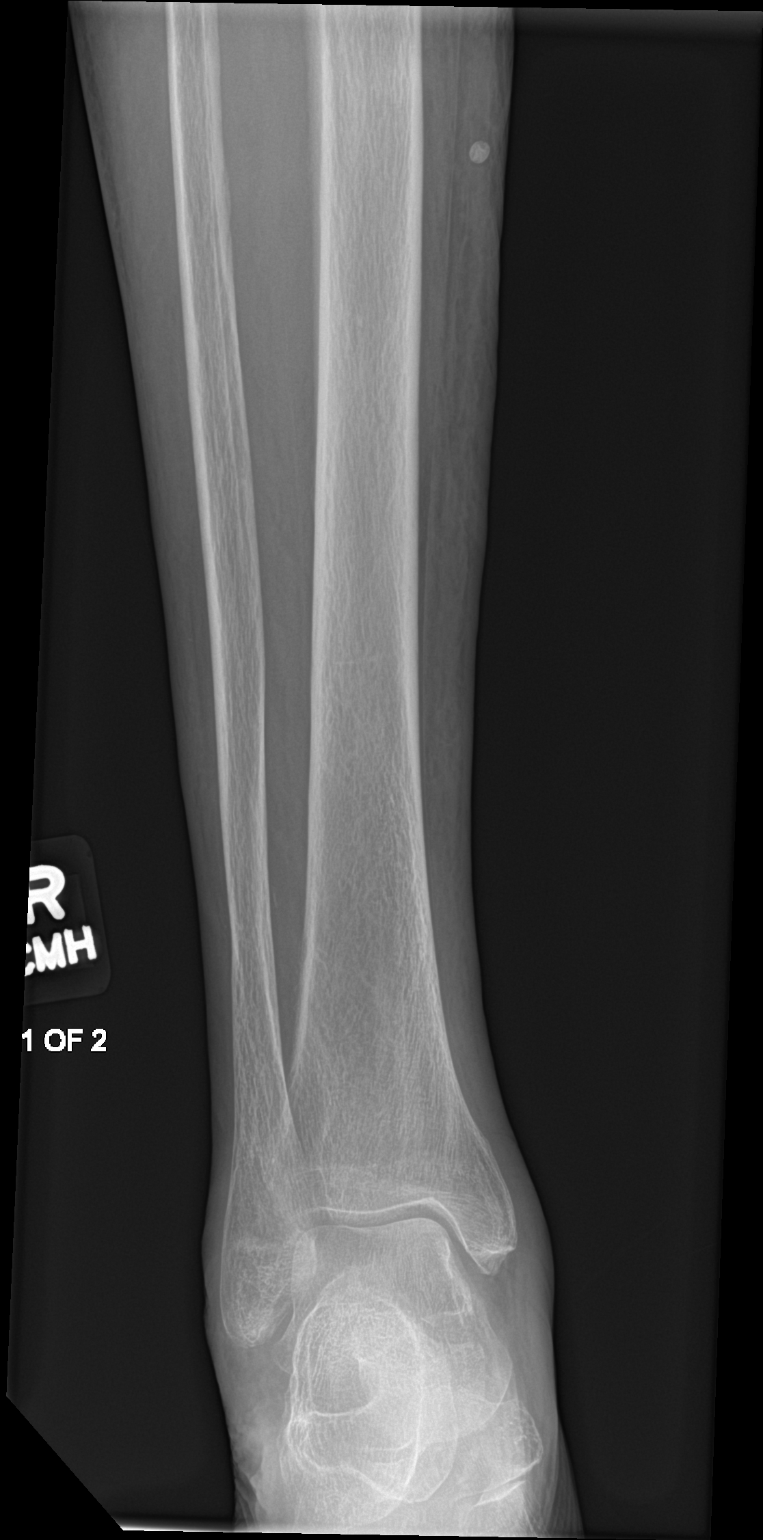

[tibia ap (2 of 2)]
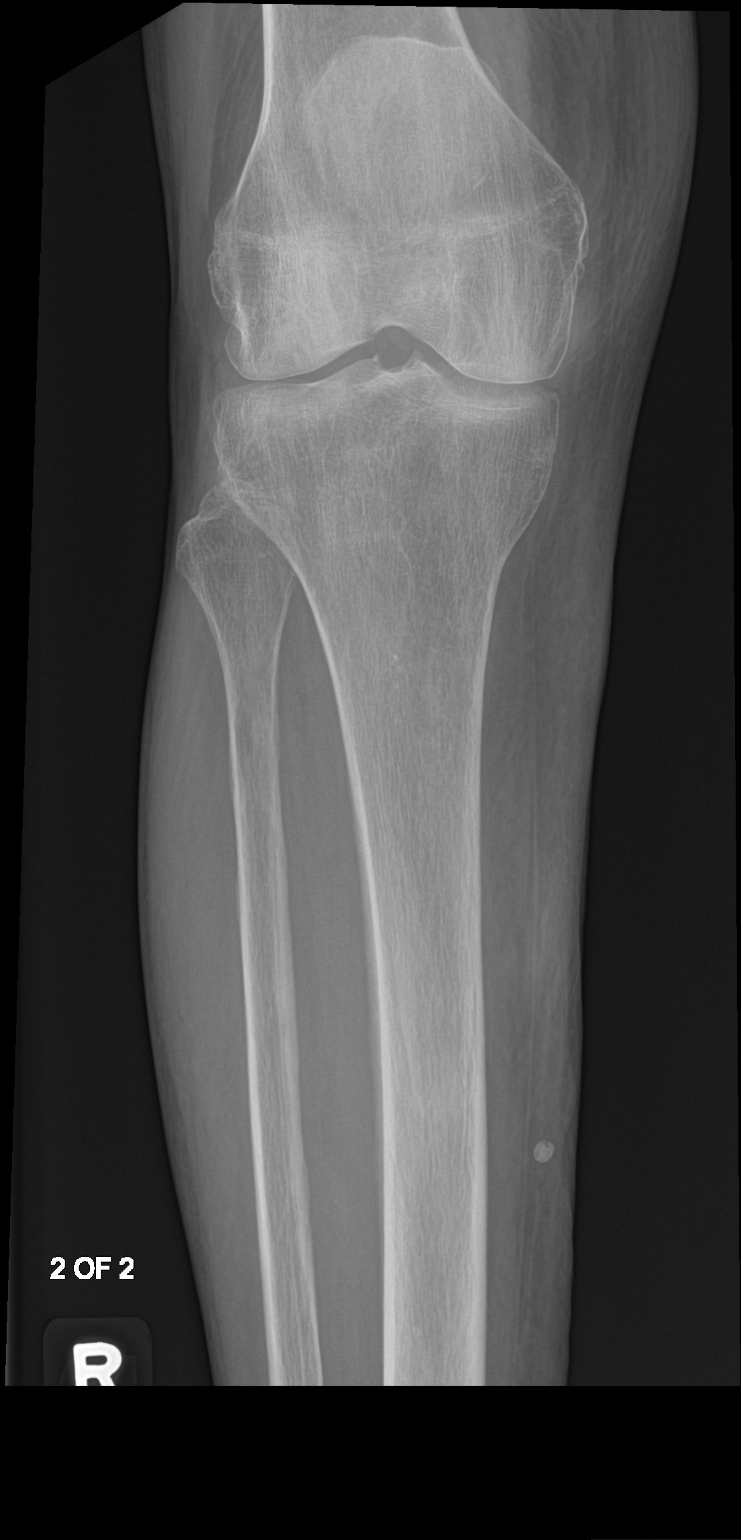

[tibia lat (1 of 2)]
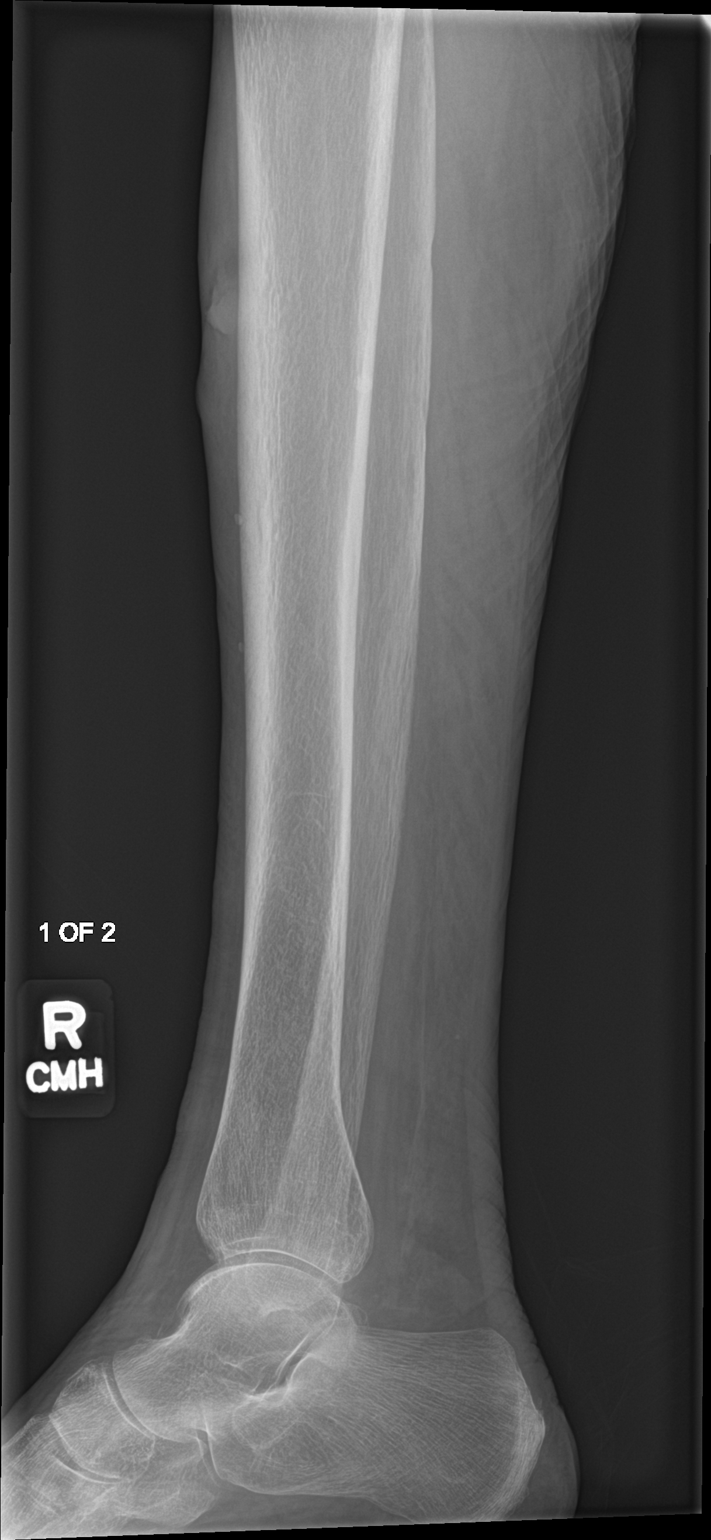

[tibia lat (2 of 2)]
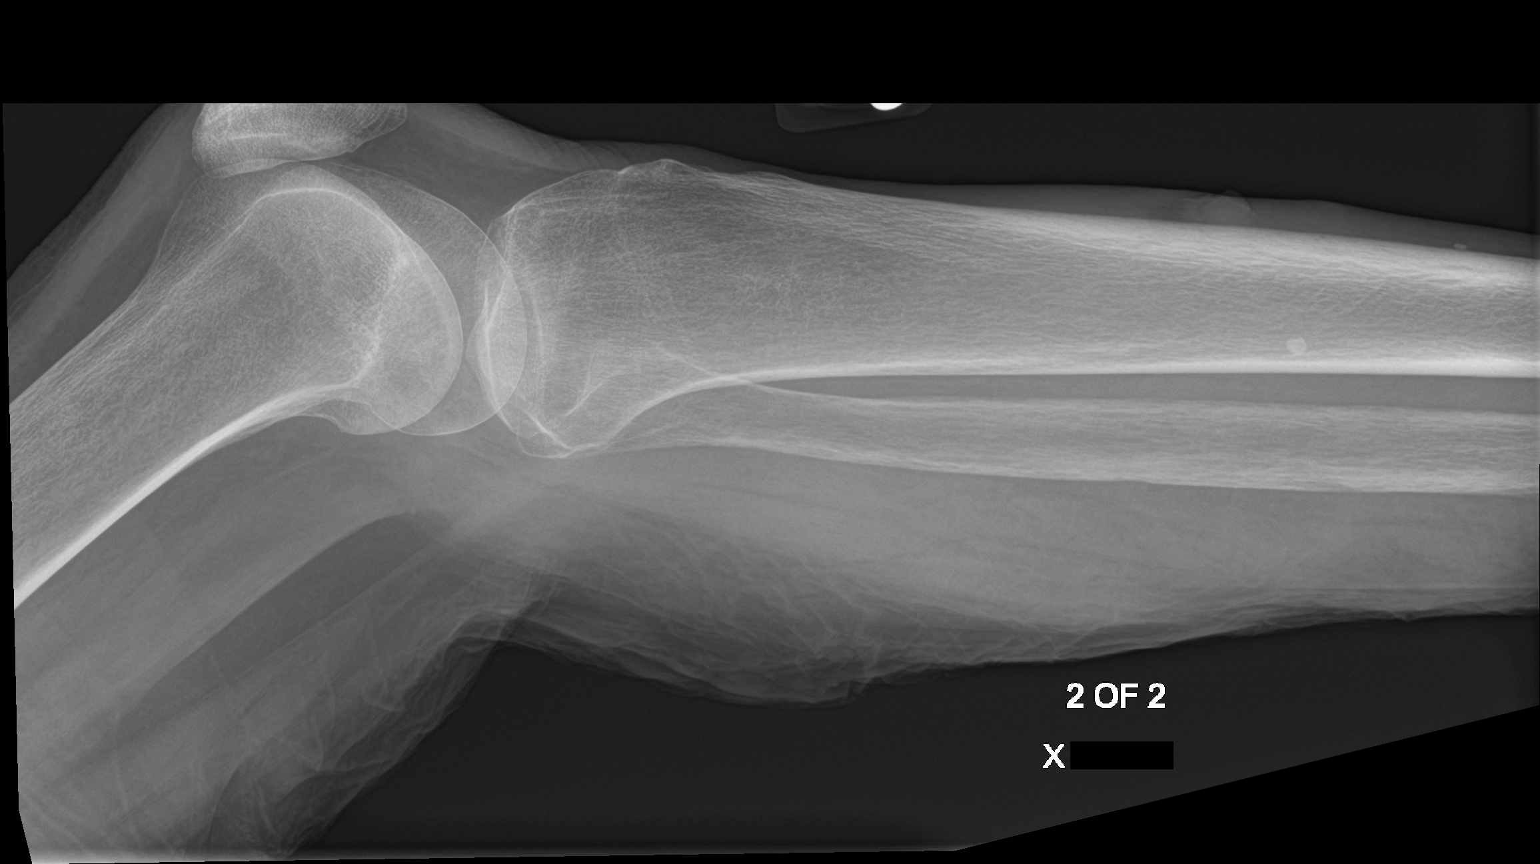

[4 of 4 positions shown; findings below may reference images not displayed]

FINDINGS: Mild osteopenia. No acute fracture or dislocation. Degenerative
changes about the knee are mild. Suspect mild pretibial soft tissue
swelling on the lateral view. Probable soft tissue calcification
just cephalad to this area anteriorly. No knee joint effusion.
Tibiotalar mild osteoarthritis as well.
IMPRESSION: No acute osseous abnormality.

Suspicion of mild pretibial soft tissue swelling possible soft
tissue calcification. Correlate with physical exam to exclude
unlikely foreign body.

## 2024-02-03 ENCOUNTER — Emergency Department (HOSPITAL_COMMUNITY)

## 2024-02-03 ENCOUNTER — Inpatient Hospital Stay (HOSPITAL_COMMUNITY)

## 2024-02-03 ENCOUNTER — Inpatient Hospital Stay (HOSPITAL_COMMUNITY)
Admission: EM | Admit: 2024-02-03 | Discharge: 2024-02-11 | DRG: 082 | Disposition: A | Discharge status: Skilled Nursing Facility | Attending: Surgery | Admitting: Surgery

## 2024-02-03 DIAGNOSIS — F05 Delirium due to known physiological condition: Secondary | ICD-10-CM | POA: Diagnosis not present

## 2024-02-03 DIAGNOSIS — I110 Hypertensive heart disease with heart failure: Secondary | ICD-10-CM | POA: Diagnosis present

## 2024-02-03 DIAGNOSIS — R040 Epistaxis: Secondary | ICD-10-CM

## 2024-02-03 DIAGNOSIS — W19XXXA Unspecified fall, initial encounter: Secondary | ICD-10-CM | POA: Diagnosis not present

## 2024-02-03 DIAGNOSIS — S52502A Unspecified fracture of the lower end of left radius, initial encounter for closed fracture: Secondary | ICD-10-CM | POA: Diagnosis present

## 2024-02-03 DIAGNOSIS — Z886 Allergy status to analgesic agent status: Secondary | ICD-10-CM | POA: Diagnosis not present

## 2024-02-03 DIAGNOSIS — E871 Hypo-osmolality and hyponatremia: Secondary | ICD-10-CM | POA: Diagnosis present

## 2024-02-03 DIAGNOSIS — S01511A Laceration without foreign body of lip, initial encounter: Secondary | ICD-10-CM

## 2024-02-03 DIAGNOSIS — Z880 Allergy status to penicillin: Secondary | ICD-10-CM | POA: Diagnosis not present

## 2024-02-03 DIAGNOSIS — S065XAA Traumatic subdural hemorrhage with loss of consciousness status unknown, initial encounter: Secondary | ICD-10-CM | POA: Diagnosis present

## 2024-02-03 DIAGNOSIS — Z79890 Hormone replacement therapy (postmenopausal): Secondary | ICD-10-CM

## 2024-02-03 DIAGNOSIS — D62 Acute posthemorrhagic anemia: Secondary | ICD-10-CM | POA: Diagnosis not present

## 2024-02-03 DIAGNOSIS — Z8249 Family history of ischemic heart disease and other diseases of the circulatory system: Secondary | ICD-10-CM

## 2024-02-03 DIAGNOSIS — Z681 Body mass index (BMI) 19 or less, adult: Secondary | ICD-10-CM | POA: Diagnosis not present

## 2024-02-03 DIAGNOSIS — S02412A LeFort II fracture, initial encounter for closed fracture: Secondary | ICD-10-CM | POA: Diagnosis present

## 2024-02-03 DIAGNOSIS — Z9181 History of falling: Secondary | ICD-10-CM

## 2024-02-03 DIAGNOSIS — E43 Unspecified severe protein-calorie malnutrition: Secondary | ICD-10-CM | POA: Diagnosis present

## 2024-02-03 DIAGNOSIS — G8929 Other chronic pain: Secondary | ICD-10-CM | POA: Diagnosis present

## 2024-02-03 DIAGNOSIS — Z90710 Acquired absence of both cervix and uterus: Secondary | ICD-10-CM

## 2024-02-03 DIAGNOSIS — I5181 Takotsubo syndrome: Secondary | ICD-10-CM | POA: Diagnosis present

## 2024-02-03 DIAGNOSIS — Y92002 Bathroom of unspecified non-institutional (private) residence single-family (private) house as the place of occurrence of the external cause: Secondary | ICD-10-CM | POA: Diagnosis not present

## 2024-02-03 DIAGNOSIS — H9190 Unspecified hearing loss, unspecified ear: Secondary | ICD-10-CM | POA: Diagnosis present

## 2024-02-03 DIAGNOSIS — E785 Hyperlipidemia, unspecified: Secondary | ICD-10-CM | POA: Diagnosis present

## 2024-02-03 DIAGNOSIS — W010XXA Fall on same level from slipping, tripping and stumbling without subsequent striking against object, initial encounter: Secondary | ICD-10-CM | POA: Diagnosis present

## 2024-02-03 DIAGNOSIS — I252 Old myocardial infarction: Secondary | ICD-10-CM | POA: Diagnosis not present

## 2024-02-03 DIAGNOSIS — D72829 Elevated white blood cell count, unspecified: Secondary | ICD-10-CM | POA: Diagnosis present

## 2024-02-03 DIAGNOSIS — E876 Hypokalemia: Secondary | ICD-10-CM | POA: Diagnosis present

## 2024-02-03 DIAGNOSIS — G250 Essential tremor: Secondary | ICD-10-CM | POA: Diagnosis present

## 2024-02-03 DIAGNOSIS — E039 Hypothyroidism, unspecified: Secondary | ICD-10-CM | POA: Diagnosis present

## 2024-02-03 DIAGNOSIS — I959 Hypotension, unspecified: Secondary | ICD-10-CM | POA: Diagnosis not present

## 2024-02-03 DIAGNOSIS — Z79899 Other long term (current) drug therapy: Secondary | ICD-10-CM

## 2024-02-03 DIAGNOSIS — Z87891 Personal history of nicotine dependence: Secondary | ICD-10-CM | POA: Diagnosis not present

## 2024-02-03 DIAGNOSIS — Z23 Encounter for immunization: Secondary | ICD-10-CM | POA: Diagnosis present

## 2024-02-03 DIAGNOSIS — R519 Headache, unspecified: Secondary | ICD-10-CM | POA: Diagnosis present

## 2024-02-03 DIAGNOSIS — I5022 Chronic systolic (congestive) heart failure: Secondary | ICD-10-CM | POA: Diagnosis present

## 2024-02-03 LAB — CBC WITH DIFFERENTIAL/PLATELET
Abs Immature Granulocytes: 0.06 K/uL (ref 0.00–0.07)
Basophils Absolute: 0 K/uL (ref 0.0–0.1)
Basophils Relative: 0 %
Eosinophils Absolute: 0.2 K/uL (ref 0.0–0.5)
Eosinophils Relative: 1 %
HCT: 39.5 % (ref 36.0–46.0)
Hemoglobin: 12.9 g/dL (ref 12.0–15.0)
Immature Granulocytes: 1 %
Lymphocytes Relative: 8 %
Lymphs Abs: 1 K/uL (ref 0.7–4.0)
MCH: 31.1 pg (ref 26.0–34.0)
MCHC: 32.7 g/dL (ref 30.0–36.0)
MCV: 95.2 fL (ref 80.0–100.0)
Monocytes Absolute: 0.7 K/uL (ref 0.1–1.0)
Monocytes Relative: 6 %
Neutro Abs: 10.1 K/uL — ABNORMAL HIGH (ref 1.7–7.7)
Neutrophils Relative %: 84 %
Platelets: 157 K/uL (ref 150–400)
RBC: 4.15 MIL/uL (ref 3.87–5.11)
RDW: 13.7 % (ref 11.5–15.5)
WBC: 12 K/uL — ABNORMAL HIGH (ref 4.0–10.5)
nRBC: 0 % (ref 0.0–0.2)

## 2024-02-03 LAB — URINALYSIS, ROUTINE W REFLEX MICROSCOPIC
Bilirubin Urine: NEGATIVE
Glucose, UA: NEGATIVE mg/dL
Ketones, ur: NEGATIVE mg/dL
Nitrite: NEGATIVE
Protein, ur: NEGATIVE mg/dL
Specific Gravity, Urine: 1.015 (ref 1.005–1.030)
pH: 8 (ref 5.0–8.0)

## 2024-02-03 LAB — BASIC METABOLIC PANEL WITH GFR
Anion gap: 10 (ref 5–15)
BUN: 10 mg/dL (ref 8–23)
CO2: 26 mmol/L (ref 22–32)
Calcium: 9 mg/dL (ref 8.9–10.3)
Chloride: 93 mmol/L — ABNORMAL LOW (ref 98–111)
Creatinine, Ser: 0.59 mg/dL (ref 0.44–1.00)
GFR, Estimated: >60 mL/min (ref >60)
Glucose, Bld: 129 mg/dL — ABNORMAL HIGH (ref 70–99)
Potassium: 4 mmol/L (ref 3.5–5.1)
Sodium: 129 mmol/L — ABNORMAL LOW (ref 135–145)

## 2024-02-03 LAB — URINALYSIS, MICROSCOPIC (REFLEX)

## 2024-02-03 MED ORDER — ACETAMINOPHEN 500 MG PO TABS
1000.0000 mg | ORAL_TABLET | Freq: Four times a day (QID) | ORAL | Status: DC
  Administered 2024-02-03 – 2024-02-11 (×24): 1000 mg via ORAL
  Filled 2024-02-03 (×26): qty 2

## 2024-02-03 MED ORDER — MELATONIN 3 MG PO TABS
3.0000 mg | ORAL_TABLET | Freq: Every evening | ORAL | Status: DC | PRN
  Administered 2024-02-07 – 2024-02-08 (×2): 3 mg via ORAL
  Filled 2024-02-03 (×2): qty 1

## 2024-02-03 MED ORDER — ATORVASTATIN CALCIUM 80 MG PO TABS
80.0000 mg | ORAL_TABLET | Freq: Every day | ORAL | Status: DC
  Administered 2024-02-03 – 2024-02-11 (×9): 80 mg via ORAL
  Filled 2024-02-03 (×2): qty 1
  Filled 2024-02-03: qty 2
  Filled 2024-02-03 (×4): qty 1
  Filled 2024-02-03: qty 2
  Filled 2024-02-03: qty 1

## 2024-02-03 MED ORDER — METHOCARBAMOL 500 MG PO TABS
500.0000 mg | ORAL_TABLET | Freq: Three times a day (TID) | ORAL | Status: AC
  Administered 2024-02-03 – 2024-02-06 (×5): 500 mg via ORAL
  Filled 2024-02-03 (×6): qty 1

## 2024-02-03 MED ORDER — METHOCARBAMOL 1000 MG/10ML IJ SOLN
500.0000 mg | Freq: Three times a day (TID) | INTRAMUSCULAR | Status: AC
  Administered 2024-02-04 (×3): 500 mg via INTRAVENOUS
  Filled 2024-02-03 (×5): qty 10

## 2024-02-03 MED ORDER — FENTANYL CITRATE (PF) 50 MCG/ML IJ SOSY
50.0000 ug | PREFILLED_SYRINGE | Freq: Once | INTRAMUSCULAR | Status: AC
  Administered 2024-02-03: 50 ug via INTRAVENOUS
  Filled 2024-02-03: qty 1

## 2024-02-03 MED ORDER — SODIUM CHLORIDE 0.9 % IV SOLN: INTRAVENOUS | Status: AC

## 2024-02-03 MED ORDER — OXYCODONE HCL 5 MG PO TABS: 5.0000 mg | ORAL_TABLET | ORAL | Status: DC | PRN

## 2024-02-03 MED ORDER — LEVETIRACETAM (KEPPRA) 500 MG/5 ML ADULT IV PUSH
500.0000 mg | Freq: Two times a day (BID) | INTRAVENOUS | Status: DC
  Administered 2024-02-03 – 2024-02-05 (×5): 500 mg via INTRAVENOUS
  Filled 2024-02-03 (×5): qty 5

## 2024-02-03 MED ORDER — POLYETHYLENE GLYCOL 3350 17 G PO PACK
17.0000 g | PACK | Freq: Every day | ORAL | Status: DC | PRN
  Administered 2024-02-06 – 2024-02-08 (×3): 17 g via ORAL
  Filled 2024-02-03 (×3): qty 1

## 2024-02-03 MED ORDER — ONDANSETRON HCL 4 MG/2ML IJ SOLN: 4.0000 mg | Freq: Four times a day (QID) | INTRAMUSCULAR | Status: DC | PRN

## 2024-02-03 MED ORDER — OXYCODONE HCL 5 MG PO TABS
2.5000 mg | ORAL_TABLET | ORAL | Status: DC | PRN
  Administered 2024-02-06: 5 mg via ORAL
  Filled 2024-02-03: qty 1

## 2024-02-03 MED ORDER — TETANUS-DIPHTH-ACELL PERTUSSIS 5-2-15.5 LF-MCG/0.5 IM SUSP
0.5000 mL | Freq: Once | INTRAMUSCULAR | Status: AC
  Administered 2024-02-03: 0.5 mL via INTRAMUSCULAR
  Filled 2024-02-03: qty 0.5

## 2024-02-03 MED ORDER — DOCUSATE SODIUM 100 MG PO CAPS
100.0000 mg | ORAL_CAPSULE | Freq: Two times a day (BID) | ORAL | Status: DC
  Administered 2024-02-03 – 2024-02-11 (×14): 100 mg via ORAL
  Filled 2024-02-03 (×14): qty 1

## 2024-02-03 MED ORDER — HYDROMORPHONE HCL 1 MG/ML IJ SOLN
0.5000 mg | INTRAMUSCULAR | Status: DC | PRN
  Administered 2024-02-04: 0.5 mg via INTRAVENOUS
  Filled 2024-02-03: qty 1

## 2024-02-03 MED ORDER — CARVEDILOL 3.125 MG PO TABS
3.1250 mg | ORAL_TABLET | Freq: Two times a day (BID) | ORAL | Status: DC
  Administered 2024-02-03 – 2024-02-10 (×13): 3.125 mg via ORAL
  Filled 2024-02-03 (×14): qty 1

## 2024-02-03 MED ORDER — LIDOCAINE-EPINEPHRINE (PF) 2 %-1:200000 IJ SOLN
10.0000 mL | Freq: Once | INTRAMUSCULAR | Status: AC
  Administered 2024-02-03: 10 mL
  Filled 2024-02-03: qty 20

## 2024-02-03 MED ORDER — CLINDAMYCIN PHOSPHATE 900 MG/50ML IV SOLN
900.0000 mg | Freq: Once | INTRAVENOUS | Status: AC
  Administered 2024-02-03: 900 mg via INTRAVENOUS
  Filled 2024-02-03: qty 50

## 2024-02-03 MED ORDER — LEVOTHYROXINE SODIUM 112 MCG PO TABS
112.0000 ug | ORAL_TABLET | ORAL | Status: DC
  Administered 2024-02-05 – 2024-02-11 (×7): 112 ug via ORAL
  Filled 2024-02-03 (×7): qty 1

## 2024-02-03 MED ORDER — METOPROLOL TARTRATE 5 MG/5ML IV SOLN: 5.0000 mg | Freq: Four times a day (QID) | INTRAVENOUS | Status: DC | PRN

## 2024-02-03 MED ORDER — ONDANSETRON 4 MG PO TBDP
4.0000 mg | ORAL_TABLET | Freq: Four times a day (QID) | ORAL | Status: DC | PRN
  Administered 2024-02-09: 17:00:00 4 mg via ORAL
  Filled 2024-02-03: qty 1

## 2024-02-03 MED ORDER — LISINOPRIL 2.5 MG PO TABS
2.5000 mg | ORAL_TABLET | Freq: Every day | ORAL | Status: DC
  Administered 2024-02-03 – 2024-02-10 (×8): 2.5 mg via ORAL
  Filled 2024-02-03 (×9): qty 1

## 2024-02-03 MED ORDER — HYDRALAZINE HCL 20 MG/ML IJ SOLN: 10.0000 mg | INTRAMUSCULAR | Status: DC | PRN

## 2024-02-03 NOTE — ED Triage Notes (Signed)
 Patient from home with a complaint of fall not on thinners. Patient was coming from bathroom when she tripped on a rug. Son found patient face down on the floor. Patient has bruises to bilateral arms, left eye, and pt bit her lip. Patient states she's had a series of falls lately. HX HTN, CAD

## 2024-02-03 NOTE — Progress Notes (Signed)
 Orthopedic Tech Progress Note Patient Details:  Charlene Vincent September 11, 1934 [id number]  Ortho Devices Type of Ortho Device: Sugartong splint Ortho Device/Splint Location: LUE Ortho Device/Splint Interventions: Ordered, Application, Adjustment   Post Interventions Patient Tolerated: Well Instructions Provided: Adjustment of device  Thersia FALCON Selso Mannor 02/03/2024, 12:36 PM

## 2024-02-03 NOTE — Consult Note (Signed)
 Reason for Consult: Midface fracture Referring Physician: Dr. Sebastian Coy Charlene Vincent is an 89 y.o. female.  HPI: History of a fall that resulted in hitting her face on the floor or some other object in the bathroom.  She has sustained a fracture of the midface that adds to a [facility] II fracture.  She has dentures.  She cannot assess occlusion.  She has no breathing problems.  She was have some bleeding from the right side of the nose which now is stopped.  There is a pack in the right side hanging out of the nose.  She does not have any vision changes or evidence of injury to the eyes.  Past Medical History:  Diagnosis Date   Anemia    Arthritis    Cellulitis    Costochondritis    Headache    Hypothyroidism    Myocardial infarction [facility]) 2012, 2016   both Takotsubo   Pneumonia    Shingles    Takotsubo cardiomyopathy    a. 2012  b. LHC in 09/2014 nl cors, EF 30-35%>>45-50% by echo 11/2014   Tremor, essential     Past Surgical History:  Procedure Laterality Date   ABDOMINAL HYSTERECTOMY     APPLICATION OF A-CELL OF EXTREMITY Left 01/22/2015   Procedure: APPLICATION OF A-CELL TO LEFT THIGH ;  Surgeon: Earlis Ranks, MD;  Location: [facility] OR;  Service: Plastics;  Laterality: Left;   APPLICATION OF WOUND VAC Left 10/09/2014   Procedure: APPLICATION OF WOUND VAC;  Surgeon: Glendia Cordella Hutchinson, MD;  Location: [facility] OR;  Service: Orthopedics;  Laterality: Left;   CARDIAC CATHETERIZATION  September 10, 2010   EF 35 to 40%. No significant CAD. ? takotsubo cardiomyopathy   CARDIAC CATHETERIZATION N/A 10/02/2014   Procedure: Left Heart Cath and Coronary Angiography;  Surgeon: Peter M Jordan, MD;  Location: [facility] INVASIVE CV LAB;  Service: Cardiovascular;  Laterality: N/A;   I & D EXTREMITY Left 10/09/2014   Procedure: DEBRIDEMENT LEFT FOOT;  Surgeon: Glendia Cordella Hutchinson, MD;  Location: [facility] OR;  Service: Orthopedics;  Laterality: Left;   INGUINAL HERNIA REPAIR     SKIN FULL THICKNESS GRAFT Left 10/12/2014    Procedure: Debridement left foot, application integra, wound vacuum-assisted closure;  Surgeon: Earlis Ranks, MD;  Location: [facility] OR;  Service: Plastics;  Laterality: Left;   SKIN GRAFT Left 01/22/2015   left thigh to left foot   SKIN SPLIT GRAFT Left 01/22/2015   Procedure:  SPLIT THICKNESS SKIN GRAFT FROM LEFT THIGH TO LEFT FOOT ;  Surgeon: Earlis Ranks, MD;  Location: [facility] OR;  Service: Plastics;  Laterality: Left;    Family History  Problem Relation Age of Onset   Heart disease Father    Heart disease Brother     Social History:  reports that she quit smoking about 13 years ago. Her smoking use included cigarettes. She smoked an average of 0.5 packs per day. She has never used smokeless tobacco. She reports that she does not drink alcohol and does not use drugs.  Allergies: Allergies[1]   Results for orders placed or performed during the hospital encounter of 02/03/24 (from the past 48 hours)  Basic metabolic panel     Status: Abnormal   Collection Time: 02/03/24  8:53 AM  Result Value Ref Range   Sodium 129 (L) 135 - 145 mmol/L   Potassium 4.0 3.5 - 5.1 mmol/L   Chloride 93 (L) 98 - 111 mmol/L   CO2 26 22 - 32 mmol/L  Glucose, Bld 129 (H) 70 - 99 mg/dL    Comment: Glucose reference range applies only to samples taken after fasting for at least 8 hours.   BUN 10 8 - 23 mg/dL   Creatinine, Ser 9.40 0.44 - 1.00 mg/dL   Calcium  9.0 8.9 - 10.3 mg/dL   GFR, Estimated >39 >39 mL/min    Comment: (NOTE) Calculated using the CKD-EPI Creatinine Equation (2021)    Anion gap 10 5 - 15    Comment: Performed at [facility], [address] [place], [place] [address]  CBC with Differential     Status: Abnormal   Collection Time: 02/03/24  8:53 AM  Result Value Ref Range   WBC 12.0 (H) 4.0 - 10.5 K/uL   RBC 4.15 3.87 - 5.11 MIL/uL   Hemoglobin 12.9 12.0 - 15.0 g/dL   HCT 60.4 63.9 - 53.9 %   MCV 95.2 80.0 - 100.0 fL   MCH 31.1 26.0 - 34.0 pg   MCHC 32.7 30.0 - 36.0  g/dL   RDW 86.2 88.4 - 84.4 %   Platelets 157 150 - 400 K/uL   nRBC 0.0 0.0 - 0.2 %   Neutrophils Relative % 84 %   Neutro Abs 10.1 (H) 1.7 - 7.7 K/uL   Lymphocytes Relative 8 %   Lymphs Abs 1.0 0.7 - 4.0 K/uL   Monocytes Relative 6 %   Monocytes Absolute 0.7 0.1 - 1.0 K/uL   Eosinophils Relative 1 %   Eosinophils Absolute 0.2 0.0 - 0.5 K/uL   Basophils Relative 0 %   Basophils Absolute 0.0 0.0 - 0.1 K/uL   Immature Granulocytes 1 %   Abs Immature Granulocytes 0.06 0.00 - 0.07 K/uL    Comment: Performed at [facility], [address] [place], [place] [address]  Urinalysis, Routine w reflex microscopic -Urine, Clean Catch     Status: Abnormal   Collection Time: 02/03/24 11:40 AM  Result Value Ref Range   Color, Urine YELLOW YELLOW   APPearance CLOUDY (A) CLEAR   Specific Gravity, Urine 1.015 1.005 - 1.030   pH 8.0 5.0 - 8.0   Glucose, UA NEGATIVE NEGATIVE mg/dL   Hgb urine dipstick TRACE (A) NEGATIVE   Bilirubin Urine NEGATIVE NEGATIVE   Ketones, ur NEGATIVE NEGATIVE mg/dL   Protein, ur NEGATIVE NEGATIVE mg/dL   Nitrite NEGATIVE NEGATIVE   Leukocytes,Ua MODERATE (A) NEGATIVE    Comment: Performed at [facility], [address], [place], [place] [address]  Urinalysis, Microscopic (reflex)     Status: Abnormal   Collection Time: 02/03/24 11:40 AM  Result Value Ref Range   RBC / HPF 0-5 0 - 5 RBC/hpf   WBC, UA 21-50 0 - 5 WBC/hpf   Bacteria, UA MANY (A) NONE SEEN   Squamous Epithelial / HPF 0-5 0 - 5 /HPF    Comment: Performed at [facility], [address], [place], [place] [address]    DG Tibia/Fibula Right Port Result Date: 02/03/2024 CLINICAL DATA:  Fall with right lower leg pain. EXAM: PORTABLE RIGHT TIBIA AND FIBULA - 2 VIEW COMPARISON:  None Available. FINDINGS: Mild diffuse decreased bone mineralization. No acute fracture or dislocation. Soft tissues unremarkable. IMPRESSION: No acute findings. Electronically Signed   By: Toribio Agreste M.D.   On:  02/03/2024 12:38   DG Pelvis Portable Result Date: 02/03/2024 EXAM: XR Pelvis, 1 or 2 View. CLINICAL HISTORY: fall, CP COMPARISON: None provided. FINDINGS: BONES: No acute fracture or  focal osseous lesion. JOINTS: No dislocation. The joint spaces are normal. SOFT TISSUES: The soft tissues are unremarkable. IMPRESSION: No acute osseous abnormality. Electronically signed by: Lynwood Seip MD 02/03/2024 10:29 AM EST RP Workstation: [id number]   DG Knee Complete 4 Views Left Result Date: 02/03/2024 EXAM: XR Left Knee, 4 Views. CLINICAL HISTORY: fall, CP COMPARISON: 07/20/2021 narrowing of lateral joint space is noted. FINDINGS: BONES: No acute fracture or focal osseous lesion. JOINTS: No dislocation. Narrowing of lateral joint space is noted. SOFT TISSUES: The soft tissues are unremarkable. IMPRESSION: 1. No acute osseous abnormality. 2. Lateral compartment joint space narrowing, compatible with osteoarthritis. Electronically signed by: Lynwood Seip MD 02/03/2024 10:25 AM EST RP Workstation: [id number]   DG Wrist Complete Left Result Date: 02/03/2024 EXAM: XR Left Wrist, 3 or more View. CLINICAL HISTORY: fall, CP COMPARISON: 10/19/2009 FINDINGS: BONES: Nondisplaced distal left radial fracture is noted. JOINTS: No dislocation. Severe degenerative change is seen involving the first carpometacarpal joint. Other visualized joint spaces appear maintained. SOFT TISSUES: The soft tissues are unremarkable. IMPRESSION: 1. Nondisplaced distal left radial fracture. 2. Severe degenerative change involving the first carpometacarpal joint. Electronically signed by: Lynwood Seip MD 02/03/2024 10:22 AM EST RP Workstation: [id number]   DG Chest Port 1 View Result Date: 02/03/2024 EXAM: XR Chest, 1 View(s). CLINICAL HISTORY: fall, CP COMPARISON: None available. FINDINGS: LUNGS: Chronic interstitial coarsening. No consolidation. PLEURAL SPACES: No pleural effusion or pneumothorax. HEART: The heart size is normal. Aortic  atherosclerotic calcification. BONES: Bilateral shoulder degenerative changes. Remote right proximal humerus fracture. Hiatal hernia. IMPRESSION: 1. No acute cardiopulmonary abnormality. Electronically signed by: Norleen Boxer MD 02/03/2024 10:18 AM EST RP Workstation: [id number]   DG Elbow Complete Right Result Date: 02/03/2024 CLINICAL DATA:  Fall.  Pain. EXAM: RIGHT ELBOW - COMPLETE 3+ VIEW COMPARISON:  None Available. FINDINGS: There is no evidence of fracture, dislocation, or joint effusion. There is no evidence of arthropathy or other focal bone abnormality. Soft tissues are unremarkable. IMPRESSION: Negative. Electronically Signed   By: Camellia Candle M.D.   On: 02/03/2024 10:11   CT Maxillofacial WO CM Result Date: 02/03/2024 EXAM: CT Maxillofacial without Intravenous Contrast. CLINICAL HISTORY: 89 year old female status post fall at home, found down. TECHNIQUE: Axial computed tomography images of the face without intravenous contrast. Sagittal and coronal reformations performed. Dose reduction technique was used including one or more of the following: automated exposure control, adjustment of mA and kV according to patient size, and/or iterative reconstruction. CONTRAST: Without. COMPARISON: Head and cervical spine CT 02/03/2024 reported separately. FINDINGS: BONES: The mandible is intact and aligned, with chronic temporomandibular joint (TMJ) degeneration greater on the right side. Comminuted bilateral pterygoid plates, comminuted fracture of the right maxillary sinus anterior and posterior walls with partial involvement of the right maxillary alveolus, and comminuted fracture of the medial wall of the right maxillary sinus involving the right nasal cavity. comminuted fracture of the bony nasal septum deviated to the left, distracted fracture of the left nasal bone extending to the left nasofrontal confluence, nondisplaced fracture at the right zygomaticomaxillary confluence, obliquely displaced  fracture of the left zygomaticomaxillary confluence extending toward the left orbital floor (coronal image 24), There is a nondisplaced fracture of the right orbital floor. No zygomatic arch fracture. The hard palate remains intact. No central skull base fracture identified. SOFT TISSUES: There is combined right buccal and masticator space hematoma and posttraumatic soft tissue gas. Generalized right face soft tissue swelling. Negative visible non-contrast larynx and oropharynx. There is posttraumatic contusion and stranding in  the right parapharyngeal space. Negative left parapharyngeal and retropharyngeal spaces. Negative left sublingual space. Ptosis of the right submandibular gland. SINUSES: There is hemorrhage in the maxillary sinuses, right greater than left. There is hemorrhage in the nasal cavity. The frontal and sphenoid sinuses demonstrate only trace layering fluid or hemorrhage. There is hemorrhage / fluid scattered in the ethmoid air cells. There is retained blood or secretions in the nasopharynx mostly on the right. Tympanic cavities and mastoids are well aerated. ORBITS: Minimal orbital wall involvement, specifically a nondisplaced fracture of the right orbital floor. The other bilateral orbit walls appear intact. Globes appear symmetric and intact. No intraorbital gas or hemorrhage. IMPRESSION: 1. Variation of [name] pattern of the bilateral facial bones, including Advanced comminution of the right maxillary sinus. Comminuted and displaced nasal septum. Minimal orbital wall involvement and no intraorbital gas or hemorrhage. 2. Hemorrhage in the paranasal sinuses, especially the right maxillary. Retained blood or secretions in the nasopharynx, mostly on the right. 3. Traumatic soft tissue space injury: right buccal, masticator, and parapharyngeal spaces. 4. No mandible fracture or dislocation. No convincing central skull base fracture. Electronically signed by: Helayne Hurst MD 02/03/2024  08:15 AM EST RP Workstation: [id number]   CT Cervical Spine Wo Contrast Result Date: 02/03/2024 EXAM: CT Cervical Spine Without Intravenous Contrast. CLINICAL HISTORY: 89 year old female with neck trauma following a fall at home, found face down. TECHNIQUE: Axial computed tomography images of the cervical spine without intravenous contrast. Sagittal and coronal reformations performed. Dose reduction technique was used including one or more of the following: automated exposure control, adjustment of mA and kV according to patient size, and/or iterative reconstruction. COMPARISON: CT face and CT head 02/03/2024, reported separately. FINDINGS: BONES: No acute osseous abnormality identified in the cervical spine. Bony alignment: Anterolisthesis of C4 on C5 measures 5 mm but appears to be degenerative in nature. Mild degenerative appearing anterolisthesis of C7 on T1. Central skull base, craniocervical junction alignment, and C1-C2 alignment are maintained. Chronic severe asymmetric left-sided C1-C2 degeneration. Upper cervical facet arthropathy predominantly on the left. Lower cervical degenerative endplate spurring. Visible upper thoracic levels appear intact. DISCS / DEGENERATIVE CHANGES: Widespread cervical disc bulging. No significant cervical spinal stenosis by CT. SOFT TISSUES: No prevertebral soft tissue swelling. Centrilobular emphysema in the visible lung apices. Calcified aortic arch atherosclerosis. Partially visible chronic degenerative changes at the right shoulder. Abnormal face is reported separately. IMPRESSION: 1. No acute cervical spine fracture or dislocation. 2. Pronounced but degenerative appearing anterolisthesis of C4 on C5 (5 mm). Mild degenerative anterolisthesis of C7 on T1. Other advanced cervical degeneration. 3. Abnormal Face reported separately. 4. Emphysema. Electronically signed by: Helayne Hurst MD 02/03/2024 08:00 AM EST RP Workstation: [id number]   CT Head Wo Contrast Result Date:  02/03/2024 EXAM: CT Head Without Intravenous Contrast. CLINICAL HISTORY: 89 year old female status post fall at home, found face down. TECHNIQUE: Axial computed tomography images of the head/brain without intravenous contrast. Dose reduction technique was used including one or more of the following: automated exposure control, adjustment of mA and kV according to patient size, and/or iterative reconstruction. CONTRAST: Without. COMPARISON: CT head 01/24/2023. FINDINGS: BRAIN: Trace anterior convexity subdural blood bilaterally on series 13 images 57 and 59, 2 mm (MBIG 1). No other acute intracranial hemorrhage identified. No mass lesion. No CT evidence for acute territorial infarct. No midline shift or extra-axial collection. No intracranial mass effect. No suspicious intracranial vascular hyperdensity. Stable gray white differentiation. Mild for age chronic periventricular white matter changes. Brain  volume is stable last year, with minimal limits for age. Small volume of gas in the right cavernous sinus could be posttraumatic or IV access related. There is no other convincing pneumocephalus. VENTRICLES: No hydrocephalus. ORBITS: The orbits are unremarkable. SINUSES AND MASTOIDS: Associated hemorrhage in the paranasal sinuses, especially the right maxillary sinus. Tympanic cavities and mastoids remain well aerated. SOFT TISSUES: No discrete scalp soft tissue injury. Posttraumatic gas in the visible right face. No radiopaque foreign body is seen. BONES: No acute skull fracture. Multiple acute facial fractures are detailed separately today. CT face and cervical spine today reported separately. Traumatic brain injury risk stratification: Skull fracture: No (low - MBIG 1) Subdural hematoma (SDH): <4 mm (MBIG 1) (Trace anterior convexity subdural blood bilaterally on series 13 images 57 and 59, 2 mm) Subarachnoid hemorrhage [facility]): No Epidural hematoma (EDH): No (low - MBIG 1) Cerebral contusion, intra-axial,  intraparenchymal hemorrhage (IPH): No Intraventricular hemorrhage (IVH): No (low - MBIG 1) Midline shift > 1mm or edema/effacement of sulci/vents: No (low - MBIG 1) IMPRESSION: 1. Trace bilateral anterior convexity subdural hematoma (2 mm). No other acute intracranial hemorrhage identified. 2. Multiple acute facial fractures, CT Face reported separately. 3. No other acute intracranial abnormality.  No calvarium fracture identified Electronically signed by: Helayne Hurst MD 02/03/2024 07:55 AM EST RP Workstation: [id number]    ROS Blood pressure (!) 83/52, pulse 89, temperature 97.6 F (36.4 C), resp. rate 16, SpO2 97%. Physical Exam Constitutional:      Appearance: Normal appearance.  HENT:     Head: Normocephalic and atraumatic.     Right Ear: Tympanic membrane is without lesions and middle ear aerated, ear canal and external ear normal.     Left Ear: Tympanic membrane is without lesions and middle ear aerated, ear canal and external ear normal.     Nose: Nose without deviation of septum.  Turbinates with mild hypertrophy, No significant swelling or masses.     Oral cavity/oropharynx: The mouth is full of crusted old blood.  I feel like her lip is too swollen to grab a hold of the midface so we will do that tomorrow.  There is a small laceration on the lip that is closed.  She has extensive ecchymosis over the face and significant swelling on the right cheek.   Larynx: low voice. Mirror attempted without success    Eyes:     Extraocular Movements: Extraocular movements intact.     Conjunctiva/sclera: Conjunctivae normal.     Pupils: Pupils are equal, round, and reactive to light.  Cardiovascular:     Rate and Rhythm: Normal rate.  Pulmonary:     Effort: Pulmonary effort is normal.  Musculoskeletal:     Cervical back: Normal range of motion and neck supple. No rigidity.  Lymphadenopathy:     Cervical: No cervical adenopathy or masses.salivary glands without lesions. .     Salivary glands-  no mass or swelling Neurological:     Mental Status: He is alert. CN 2-12 intact. No nystagmus      Assessment/Plan: Midface fractures-she has extensive fractures that are most likely combined to be a [facility] I/II.  She does not have any teeth.  Given this finding and her disposition it may be best to just observe this fracture as they will heal and she can always have the dentures adjusted if they are slightly off.  I have talked to the patient and the son regarding this and they are going to think through it.  I  will come back tomorrow and further discuss our plan.  Norleen Notice 02/03/2024, 3:03 PM        [1]  Allergies Allergen Reactions   Aspirin  Other (See Comments)    But taking low dose therapy without problems.   Nystatin  Itching    She said when she used the cream it made symptoms worse    Penicillins Rash

## 2024-02-03 NOTE — ED Notes (Signed)
 Trauma MD made aware of PT not being able to drink water through a cup or straw. MD told this RN to hold off on meds until speech can evaluate in the AM

## 2024-02-03 NOTE — ED Notes (Signed)
 PT is having a difficult time taking meds with water and a straw at this time. PT even has a hard time drinking water straight from the cup.

## 2024-02-03 NOTE — ED Notes (Signed)
 This RN assumes care of this PT at this time

## 2024-02-03 NOTE — ED Provider Notes (Cosign Needed Addendum)
 [place] EMERGENCY DEPARTMENT AT [facility] Provider Note   CSN: [id number] Arrival date & time: 02/03/24  9373     Patient presents with: Charlene Vincent is a 89 y.o. female.   Charlene Vincent is a 89 y.o. female with a history of hypertension, Takotsubo's cardiomyopathy, MI, CHF, hypothyroidism, who presents to the emergency department via EMS after a mechanical fall.  Patient had gotten up to go to the bathroom and walks with a walker at baseline and thinks that she either got caught on a rug or hung up on her walker and fell.  Son whom she lives with found patient facedown on the floor but alert and conscious and son reports that patient is at baseline mental status.  Patient complaining of generalized pain and soreness, complaining of pain over her face where she has swelling and also notes cut above her lip.  Son also reported some bleeding from her nose.  C-collar placed by EMS.  Patient also complaining of some mild soreness to the chest as well as pain to the left wrist and right elbow.  Patient also reports some pain and swelling of the left knee.  Patient is not on any blood thinners.  Does not think she lost consciousness but is not entirely sure.  Has had a history of falls recently.  The history is provided by the patient and medical records.  Fall Associated symptoms include chest pain. Pertinent negatives include no abdominal pain and no shortness of breath.       Prior to Admission medications  Medication Sig Start Date End Date Taking? Authorizing Provider  acetaminophen  (TYLENOL ) 500 MG tablet Take 1,000 mg by mouth every 6 (six) hours.    [provider]  atorvastatin  (LIPITOR ) 80 MG tablet Take 80 mg by mouth daily.    [provider]  carvedilol  (COREG ) 3.125 MG tablet Take 1 tablet (3.125 mg total) by mouth 2 (two) times daily with a meal. 01/19/22   Jordan, Peter M, MD  clotrimazole  (GYNE-LOTRIMIN ) 1 % vaginal cream  Place 1 Applicatorful vaginally at bedtime. 02/01/21   Bernis Ernst, Charlene Vincent  docusate sodium  (COLACE) 100 MG capsule Take 1 capsule (100 mg total) by mouth 2 (two) times daily. 09/30/21   Ricky Fines, MD  ferrous sulfate  325 (65 FE) MG EC tablet Take 1 tablet by mouth 2 (two) times daily. 08/19/21   [provider]  fluticasone  (FLONASE ) 50 MCG/ACT nasal spray Place 1-2 sprays into both nostrils daily. 09/02/16   [provider]  levothyroxine  (SYNTHROID ) 112 MCG tablet Take 1 tablet (112 mcg total) by mouth daily. 10/01/21   Ricky Fines, MD  lisinopril  (ZESTRIL ) 2.5 MG tablet Take 1 tablet (2.5 mg total) by mouth daily. 01/19/22   Jordan, Peter M, MD  mupirocin  ointment (BACTROBAN ) 2 % Apply 1 Application topically 3 (three) times daily. 07/28/21   [provider]  oxyCODONE -acetaminophen  (PERCOCET) 5-325 MG tablet Take 1 tablet by mouth every 6 (six) hours as needed. Patient not taking: Reported on 06/12/2023 06/05/22   Haze Lonni PARAS, MD  oxyCODONE -acetaminophen  (PERCOCET/ROXICET) 5-325 MG tablet Take 0.5-1 tablets by mouth as needed for moderate pain. 10/24/20   [provider]  pantoprazole  (PROTONIX ) 20 MG tablet Take 1 tablet (20 mg total) by mouth daily. 09/30/21 06/12/23  Ricky Fines, MD  polyethylene glycol (MIRALAX  / GLYCOLAX ) 17 g packet Take 17 g by mouth daily. Patient not taking: Reported on 06/12/2023 10/01/21   Ricky Fines,  MD    Allergies: Aspirin , Nystatin , and Penicillins    Review of Systems  Respiratory:  Negative for shortness of breath.   Cardiovascular:  Positive for chest pain.  Gastrointestinal:  Negative for abdominal pain.  Musculoskeletal:  Positive for arthralgias and myalgias.  Skin:  Positive for wound.    Updated Vital Signs BP (!) 187/89 (BP Location: Right Arm)   Pulse 64   Temp 97.9 F (36.6 C) (Axillary)   Resp 17   SpO2 100%   Physical Exam Constitutional:      Appearance: She is ill-appearing.      Comments: Alert and somewhat ill-appearing with obvious facial trauma  HENT:     Head:     Comments: Patient with obvious facial trauma, swelling and bruising under both eyes, 3 cm laceration just above the upper lip that is full-thickness and extends into the mouth with some bleeding.  There is also facial swelling to the right side of the face without obvious deformity.    Nose: Nasal deformity present.     Right Nostril: Epistaxis present.     Left Nostril: No epistaxis.     Comments: Slow bleeding present from the right nare with some small clots present, nose appears swollen with some deviation. No septal hematoma    Mouth/Throat:     Comments: Laceration of the upper lip extends full-thickness to the intraoral surface of the upper lip with small amount of bleeding, no loose or broken teeth noted.  No obvious malocclusion of the jaw Eyes:     Comments: Mild amount of periorbital swelling bilaterally, EOMs intact, pupils equal round and responsive  Neck:     Comments: C-collar in place, no palpable midline deformity or step-off Cardiovascular:     Rate and Rhythm: Normal rate and regular rhythm.     Heart sounds: Normal heart sounds.  Pulmonary:     Effort: Pulmonary effort is normal.     Breath sounds: Normal breath sounds.     Comments: There is some mild tenderness to the upper chest wall without palpable deformity or flail chest, breath sounds present and equal bilaterally Chest:     Chest wall: Tenderness present.  Abdominal:     General: There is no distension.     Palpations: There is no mass.     Tenderness: There is no abdominal tenderness. There is no guarding.  Musculoskeletal:        General: Tenderness present.     Comments: Tenderness and swelling noted over the right wrist with small hematoma and there is a small hematoma noted to the left anterior knee but patient is able to flex and extend the knee.  Mild tenderness to palpation over the right elbow without obvious  swelling or deformity.  All other joints are supple and easily movable and all compartments are soft.  No midline spinal tenderness, step-off or deformity.  Skin:    General: Skin is warm and dry.     Findings: Bruising present.  Neurological:     Mental Status: She is alert. Mental status is at baseline.     Comments: Son at bedside and reports she is at baseline mental status, very hard of hearing which makes neurologic exam challenging. Speech is clear and patient is able to follow commands but sometimes requires repeat instruction. Is unsure of the year but can tell me her name and her son's name and some details about what happened today. No facial droop, EOMs intact. 5/5 strength in  all extremities, sensation intact in all extremities. Moving all extremities without ataxia.     (all labs ordered are listed, but only abnormal results are displayed) Labs Reviewed - No data to display  EKG: EKG Interpretation Date/Time:  Thursday February 03 2024 08:06:17 EST Ventricular Rate:  70 PR Interval:  128 QRS Duration:  113 QT Interval:  445 QTC Calculation: 481 R Axis:   53  Text Interpretation: Sinus rhythm Borderline intraventricular conduction delay Confirmed by Yolande Charleston [phone]) on 02/03/2024 8:57:03 AM  Radiology: [facility] Pelvis Portable Result Date: 02/03/2024 EXAM: XR Pelvis, 1 or 2 View. CLINICAL HISTORY: fall, CP COMPARISON: None provided. FINDINGS: BONES: No acute fracture or focal osseous lesion. JOINTS: No dislocation. The joint spaces are normal. SOFT TISSUES: The soft tissues are unremarkable. IMPRESSION: No acute osseous abnormality. Electronically signed by: Lynwood Seip MD 02/03/2024 10:29 AM EST RP Workstation: [id number]   DG Knee Complete 4 Views Left Result Date: 02/03/2024 EXAM: XR Left Knee, 4 Views. CLINICAL HISTORY: fall, CP COMPARISON: 07/20/2021 narrowing of lateral joint space is noted. FINDINGS: BONES: No acute fracture or focal osseous lesion. JOINTS:  No dislocation. Narrowing of lateral joint space is noted. SOFT TISSUES: The soft tissues are unremarkable. IMPRESSION: 1. No acute osseous abnormality. 2. Lateral compartment joint space narrowing, compatible with osteoarthritis. Electronically signed by: Lynwood Seip MD 02/03/2024 10:25 AM EST RP Workstation: [id number]   DG Wrist Complete Left Result Date: 02/03/2024 EXAM: XR Left Wrist, 3 or more View. CLINICAL HISTORY: fall, CP COMPARISON: 10/19/2009 FINDINGS: BONES: Nondisplaced distal left radial fracture is noted. JOINTS: No dislocation. Severe degenerative change is seen involving the first carpometacarpal joint. Other visualized joint spaces appear maintained. SOFT TISSUES: The soft tissues are unremarkable. IMPRESSION: 1. Nondisplaced distal left radial fracture. 2. Severe degenerative change involving the first carpometacarpal joint. Electronically signed by: Lynwood Seip MD 02/03/2024 10:22 AM EST RP Workstation: [id number]   DG Chest Port 1 View Result Date: 02/03/2024 EXAM: XR Chest, 1 View(s). CLINICAL HISTORY: fall, CP COMPARISON: None available. FINDINGS: LUNGS: Chronic interstitial coarsening. No consolidation. PLEURAL SPACES: No pleural effusion or pneumothorax. HEART: The heart size is normal. Aortic atherosclerotic calcification. BONES: Bilateral shoulder degenerative changes. Remote right proximal humerus fracture. Hiatal hernia. IMPRESSION: 1. No acute cardiopulmonary abnormality. Electronically signed by: Norleen Boxer MD 02/03/2024 10:18 AM EST RP Workstation: [id number]   DG Elbow Complete Right Result Date: 02/03/2024 CLINICAL DATA:  Fall.  Pain. EXAM: RIGHT ELBOW - COMPLETE 3+ VIEW COMPARISON:  None Available. FINDINGS: There is no evidence of fracture, dislocation, or joint effusion. There is no evidence of arthropathy or other focal bone abnormality. Soft tissues are unremarkable. IMPRESSION: Negative. Electronically Signed   By: Camellia Candle M.D.   On: 02/03/2024 10:11    CT Maxillofacial WO CM Result Date: 02/03/2024 EXAM: CT Maxillofacial without Intravenous Contrast. CLINICAL HISTORY: 89 year old female status post fall at home, found down. TECHNIQUE: Axial computed tomography images of the face without intravenous contrast. Sagittal and coronal reformations performed. Dose reduction technique was used including one or more of the following: automated exposure control, adjustment of mA and kV according to patient size, and/or iterative reconstruction. CONTRAST: Without. COMPARISON: Head and cervical spine CT 02/03/2024 reported separately. FINDINGS: BONES: The mandible is intact and aligned, with chronic temporomandibular joint (TMJ) degeneration greater on the right side. Comminuted bilateral pterygoid plates, comminuted fracture of the right maxillary sinus anterior and posterior walls with partial involvement of the right maxillary alveolus, and comminuted fracture of the medial wall of the  right maxillary sinus involving the right nasal cavity. comminuted fracture of the bony nasal septum deviated to the left, distracted fracture of the left nasal bone extending to the left nasofrontal confluence, nondisplaced fracture at the right zygomaticomaxillary confluence, obliquely displaced fracture of the left zygomaticomaxillary confluence extending toward the left orbital floor (coronal image 24), There is a nondisplaced fracture of the right orbital floor. No zygomatic arch fracture. The hard palate remains intact. No central skull base fracture identified. SOFT TISSUES: There is combined right buccal and masticator space hematoma and posttraumatic soft tissue gas. Generalized right face soft tissue swelling. Negative visible non-contrast larynx and oropharynx. There is posttraumatic contusion and stranding in the right parapharyngeal space. Negative left parapharyngeal and retropharyngeal spaces. Negative left sublingual space. Ptosis of the right submandibular gland.  SINUSES: There is hemorrhage in the maxillary sinuses, right greater than left. There is hemorrhage in the nasal cavity. The frontal and sphenoid sinuses demonstrate only trace layering fluid or hemorrhage. There is hemorrhage / fluid scattered in the ethmoid air cells. There is retained blood or secretions in the nasopharynx mostly on the right. Tympanic cavities and mastoids are well aerated. ORBITS: Minimal orbital wall involvement, specifically a nondisplaced fracture of the right orbital floor. The other bilateral orbit walls appear intact. Globes appear symmetric and intact. No intraorbital gas or hemorrhage. IMPRESSION: 1. Variation of [name] pattern of the bilateral facial bones, including Advanced comminution of the right maxillary sinus. Comminuted and displaced nasal septum. Minimal orbital wall involvement and no intraorbital gas or hemorrhage. 2. Hemorrhage in the paranasal sinuses, especially the right maxillary. Retained blood or secretions in the nasopharynx, mostly on the right. 3. Traumatic soft tissue space injury: right buccal, masticator, and parapharyngeal spaces. 4. No mandible fracture or dislocation. No convincing central skull base fracture. Electronically signed by: Helayne Hurst MD 02/03/2024 08:15 AM EST RP Workstation: [id number]   CT Cervical Spine Wo Contrast Result Date: 02/03/2024 EXAM: CT Cervical Spine Without Intravenous Contrast. CLINICAL HISTORY: 89 year old female with neck trauma following a fall at home, found face down. TECHNIQUE: Axial computed tomography images of the cervical spine without intravenous contrast. Sagittal and coronal reformations performed. Dose reduction technique was used including one or more of the following: automated exposure control, adjustment of mA and kV according to patient size, and/or iterative reconstruction. COMPARISON: CT face and CT head 02/03/2024, reported separately. FINDINGS: BONES: No acute osseous abnormality  identified in the cervical spine. Bony alignment: Anterolisthesis of C4 on C5 measures 5 mm but appears to be degenerative in nature. Mild degenerative appearing anterolisthesis of C7 on T1. Central skull base, craniocervical junction alignment, and C1-C2 alignment are maintained. Chronic severe asymmetric left-sided C1-C2 degeneration. Upper cervical facet arthropathy predominantly on the left. Lower cervical degenerative endplate spurring. Visible upper thoracic levels appear intact. DISCS / DEGENERATIVE CHANGES: Widespread cervical disc bulging. No significant cervical spinal stenosis by CT. SOFT TISSUES: No prevertebral soft tissue swelling. Centrilobular emphysema in the visible lung apices. Calcified aortic arch atherosclerosis. Partially visible chronic degenerative changes at the right shoulder. Abnormal face is reported separately. IMPRESSION: 1. No acute cervical spine fracture or dislocation. 2. Pronounced but degenerative appearing anterolisthesis of C4 on C5 (5 mm). Mild degenerative anterolisthesis of C7 on T1. Other advanced cervical degeneration. 3. Abnormal Face reported separately. 4. Emphysema. Electronically signed by: Helayne Hurst MD 02/03/2024 08:00 AM EST RP Workstation: [id number]   CT Head Wo Contrast Result Date: 02/03/2024 EXAM: CT Head Without Intravenous Contrast. CLINICAL HISTORY: 89 year old  female status post fall at home, found face down. TECHNIQUE: Axial computed tomography images of the head/brain without intravenous contrast. Dose reduction technique was used including one or more of the following: automated exposure control, adjustment of mA and kV according to patient size, and/or iterative reconstruction. CONTRAST: Without. COMPARISON: CT head 01/24/2023. FINDINGS: BRAIN: Trace anterior convexity subdural blood bilaterally on series 13 images 57 and 59, 2 mm (MBIG 1). No other acute intracranial hemorrhage identified. No mass lesion. No CT evidence for acute territorial  infarct. No midline shift or extra-axial collection. No intracranial mass effect. No suspicious intracranial vascular hyperdensity. Stable gray white differentiation. Mild for age chronic periventricular white matter changes. Brain volume is stable last year, with minimal limits for age. Small volume of gas in the right cavernous sinus could be posttraumatic or IV access related. There is no other convincing pneumocephalus. VENTRICLES: No hydrocephalus. ORBITS: The orbits are unremarkable. SINUSES AND MASTOIDS: Associated hemorrhage in the paranasal sinuses, especially the right maxillary sinus. Tympanic cavities and mastoids remain well aerated. SOFT TISSUES: No discrete scalp soft tissue injury. Posttraumatic gas in the visible right face. No radiopaque foreign body is seen. BONES: No acute skull fracture. Multiple acute facial fractures are detailed separately today. CT face and cervical spine today reported separately. Traumatic brain injury risk stratification: Skull fracture: No (low - MBIG 1) Subdural hematoma (SDH): <4 mm (MBIG 1) (Trace anterior convexity subdural blood bilaterally on series 13 images 57 and 59, 2 mm) Subarachnoid hemorrhage [facility]): No Epidural hematoma (EDH): No (low - MBIG 1) Cerebral contusion, intra-axial, intraparenchymal hemorrhage (IPH): No Intraventricular hemorrhage (IVH): No (low - MBIG 1) Midline shift > 1mm or edema/effacement of sulci/vents: No (low - MBIG 1) IMPRESSION: 1. Trace bilateral anterior convexity subdural hematoma (2 mm). No other acute intracranial hemorrhage identified. 2. Multiple acute facial fractures, CT Face reported separately. 3. No other acute intracranial abnormality.  No calvarium fracture identified Electronically signed by: Helayne Hurst MD 02/03/2024 07:55 AM EST RP Workstation: [id number]     .Laceration Repair  Date/Time: 02/03/2024 12:16 PM  Performed by: Charlene Larraine FALCON, Charlene Vincent Authorized by: Charlene Larraine FALCON, Charlene Vincent   Consent:    Consent  obtained:  Verbal   Consent given by:  Patient   Risks, benefits, and alternatives were discussed: yes     Risks discussed:  Infection, need for additional repair, nerve damage, poor cosmetic result and poor wound healing   Alternatives discussed:  No treatment Universal protocol:    Procedure explained and questions answered to patient or proxy's satisfaction: yes     Patient identity confirmed:  Verbally with patient Anesthesia:    Anesthesia method:  Local infiltration   Local anesthetic:  Lidocaine  2% WITH epi Laceration details:    Location:  Lip   Lip location:  Upper lip, full thickness   Vermilion border involved: yes     Height of lip laceration:  Vermilion only   Length (cm):  4   Depth (mm):  10 Exploration:    Hemostasis achieved with:  Epinephrine  and direct pressure   Wound exploration: wound explored through full range of motion and entire depth of wound visualized     Wound extent: areolar tissue violated   Treatment:    Area cleansed with:  Saline   Amount of cleaning:  Standard   Irrigation solution:  Sterile saline   Debridement:  None Skin repair:    Repair method:  Sutures   Suture size:  6-0   Suture material:  Prolene and fast-absorbing gut   Suture technique:  Simple interrupted and running   Number of sutures:  11 (5 external simple interupted, 6 internal running) Approximation:    Approximation:  Close   Vermilion border well-aligned: yes   Repair type:    Repair type:  Intermediate Post-procedure details:    Dressing:  Open (no dressing)   Procedure completion:  Tolerated well, no immediate complications    Medications Ordered in the ED  lidocaine -EPINEPHrine  (XYLOCAINE  W/EPI) 2 %-1:200000 (PF) injection 10 mL (10 mLs Infiltration Given by Other 02/03/24 0851)  Tdap (ADACEL) injection 0.5 mL (0.5 mLs Intramuscular Given 02/03/24 0849)  clindamycin  (CLEOCIN ) IVPB 900 mg (0 mg Intravenous Stopped 02/03/24 1038)  fentaNYL  (SUBLIMAZE ) injection 50  mcg (50 mcg Intravenous Given 02/03/24 0955)                                    Medical Decision Making Amount and/or Complexity of Data Reviewed Labs: ordered. Radiology: ordered.  Risk Prescription drug management. Decision regarding hospitalization.   89 y.o. female presents to the ED with complaints of fall, this involves an extensive number of treatment options, and is a complaint that carries with it a high risk of complications and morbidity.  The differential diagnosis includes facial fractures, intracranial bleed, skull fracture, C-spine fracture, potential for extremity fractures, electrolyte derangement, UTI,   On arrival pt is nontoxic, vitals stable patient is hypertensive on arrival this has improved some without intervention. Exam significant for laceration above the upper lip that is full-thickness and extends into the mouth.  Patient with facial swelling and bruising as well.  Bruising and swelling to the left wrist and small hematoma noted over the left knee.  Slight chest tenderness with clear lungs bilaterally and no palpable deformity.  No abdominal tenderness  Additional history obtained from son at bedside. Previous records obtained and reviewed   I ordered medication including fentanyl , Tdap and clindamycin  given multiple facial fractures and lacerations  Lab Tests:  I Ordered, reviewed, and interpreted labs, which included: Slight leukocytosis of 12.0, normal hemoglobin, hyponatremia of 129 and chloride of 93, glucose 129, no other electrolyte derangements, urinalysis is pending  Imaging Studies ordered:  I ordered imaging studies which included CT of the head, maxillofacial bones and cervical spine as well as x-rays of the chest, pelvis, left wrist and knee and right elbow, I independently visualized and interpreted imaging which showed bilateral small subdural hemorrhages without mass effect, multiple facial fractures with Ladora Appl II pattern and comminuted  fracture of the right sinus with right sided epistaxis.  C-spine without fracture.  Patient also with a nondisplaced fracture of the left wrist, all other extremity x-rays are  ED Course:   Patient with multiple traumatic injuries after a ground-level fall.  She has small subdural hemorrhages, [name] and nondisplaced left distal radius fracture.  Neurosurgery consulted regarding 2 small subdural hemorrhages, per son patient is currently at baseline mental status without focal neurologic deficits.  They recommend repeat CT scan in 8 hours.  Case discussed with Dr. Roark on for ENT facial trauma regarding Ladora Appl fracture and nosebleed and he will see patient in consult.  Case discussed with Dr. Lorretta with hand surgery regarding nondisplaced left distal radius fracture recommends splinting and he will follow with patient.  Case discussed with Dr. Sebastian with trauma, they will plan to admit the patient to trauma  service given multiple traumatic injuries requiring coordination with multiple services.   Portions of this note were generated with Scientist, clinical (histocompatibility and immunogenetics). Dictation errors may occur despite best attempts at proofreading.      Final diagnoses:  Fall, initial encounter  Closed [name], initial encounter [facility])  Subdural hematoma (HCC)  Laceration of upper lip with complication, initial encounter  Epistaxis due to trauma    ED Discharge Orders     None          Charlene Vincent 02/03/24 1206    Charlene Larraine JULIANNA, Charlene Vincent 02/03/24 1253    Yolande Lamar BROCKS, MD 02/04/24 [phone]

## 2024-02-03 NOTE — ED Notes (Signed)
 PT attempting to get out of the bed at this time, bed alarm placed.

## 2024-02-03 NOTE — H&P (Addendum)
 Charlene Vincent 24-Sep-1934  [id number].    Requesting MD: Larraine Christen, PA-C Chief Complaint/Reason for Consult: GLF  HPI: Charlene Vincent is a 89 y.o. female with a hx of hypothyroidism, Takotsubo CM, and prior MI who presented after a GLF. Patient's son, Toribio, at bedside who helps provide history. EDP also at bedside. Patient reported to initially be more orientated but now is only orientated to self and place, not time or situation. She does not currently remember falling but previously reported walking with a walker this AM at home and thinks that she either got caught on a rug or hung up on her walker and fell. + head trauma. Unclear LOC. She was found by son. Per notes, she has a hx of falls recently. She currently complains of HA, facial pain, sternal pain, and left wrist pain. No visual changes, neck pain, new back pain (reports chronic back pain), abdominal pain or other extremity pain.   Patient is not on any blood thinners. Allergy to ASA, Nystatin  and PCN. Son reports patient does not use alcohol, tobacco, or illicit drugs. He does report she is on Percocet 5mg  prn at baseline for pain from arthritis. Prior abdominal hysterectomy and inguinal hernia repair.  She lives at home with her son and son's friend at baseline in a mobile home and ambulates with a RW.   In the ED she has been HDS without tachycardia or hypotension on RA. WBC 12. Cr wnl. Na 129. EKG Sinus. Imaging with B SDH, facial fx, and L wrist fx. EDP has contacted NSGY, Hand surgery and ENT. Trauma surgery was asked to see.   She is about to get I/O as she reports she needs to void but has been unable to here.   ROS: ROS As above, see hpi  Family History  Problem Relation Age of Onset   Heart disease Father    Heart disease Brother     Past Medical History:  Diagnosis Date   Anemia    Arthritis    Cellulitis    Costochondritis    Headache    Hypothyroidism    Myocardial infarction [facility]) 2012,  2016   both Takotsubo   Pneumonia    Shingles    Takotsubo cardiomyopathy    a. 2012  b. LHC in 09/2014 nl cors, EF 30-35%>>45-50% by echo 11/2014   Tremor, essential     Past Surgical History:  Procedure Laterality Date   ABDOMINAL HYSTERECTOMY     APPLICATION OF A-CELL OF EXTREMITY Left 01/22/2015   Procedure: APPLICATION OF A-CELL TO LEFT THIGH ;  Surgeon: Earlis Ranks, MD;  Location: [facility] OR;  Service: Plastics;  Laterality: Left;   APPLICATION OF WOUND VAC Left 10/09/2014   Procedure: APPLICATION OF WOUND VAC;  Surgeon: Glendia Cordella Hutchinson, MD;  Location: [facility] OR;  Service: Orthopedics;  Laterality: Left;   CARDIAC CATHETERIZATION  September 10, 2010   EF 35 to 40%. No significant CAD. ? takotsubo cardiomyopathy   CARDIAC CATHETERIZATION N/A 10/02/2014   Procedure: Left Heart Cath and Coronary Angiography;  Surgeon: Peter M Jordan, MD;  Location: [facility] INVASIVE CV LAB;  Service: Cardiovascular;  Laterality: N/A;   I & D EXTREMITY Left 10/09/2014   Procedure: DEBRIDEMENT LEFT FOOT;  Surgeon: Glendia Cordella Hutchinson, MD;  Location: [facility] OR;  Service: Orthopedics;  Laterality: Left;   INGUINAL HERNIA REPAIR     SKIN FULL THICKNESS GRAFT Left 10/12/2014   Procedure: Debridement left foot, application integra, wound vacuum-assisted  closure;  Surgeon: Earlis Ranks, MD;  Location: [facility] OR;  Service: Plastics;  Laterality: Left;   SKIN GRAFT Left 01/22/2015   left thigh to left foot   SKIN SPLIT GRAFT Left 01/22/2015   Procedure:  SPLIT THICKNESS SKIN GRAFT FROM LEFT THIGH TO LEFT FOOT ;  Surgeon: Earlis Ranks, MD;  Location: [facility] OR;  Service: Plastics;  Laterality: Left;    Social History:  reports that she quit smoking about 13 years ago. Her smoking use included cigarettes. She smoked an average of 0.5 packs per day. She has never used smokeless tobacco. She reports that she does not drink alcohol and does not use drugs.  Allergies: Allergies[1]  (Not in a hospital admission)    Physical  Exam: Blood pressure (!) 165/76, pulse 85, temperature 97.9 F (36.6 C), temperature source Axillary, resp. rate 17, SpO2 100%. Gen:  Frail, elderly female in NAD HEENT: Bilateral raccoon eyes. PEERL. EOMI. R epistaxis. Upper lip swelling. Edentulous (baseline wears dentures) Neck: CT c-spine reviewed and negative for fracture. No midline pain or TTP. Patient able to turn head left and right without midline cervical pain. Neck flexion and extension performed without midline pain. C-spine cleared and collar removed.  Card:  RRR. Radial and DP pulse 2+ Pulm:  CTAB, no W/R/R, effort normal. On RA. Sternal ttp.  Abd: Soft, ND, NT Psych: A&Ox4 Skin: Scattered bruising to face and extremities.  Neuro: Non-focal. MAE's with limitations in left wrist 2/2 known fx's. SILT to BUE and BLE. CN 3-12 grossly intact w/ limitations from facial swelling w/ known facial fx's.  Msk: L wrist ttp and swelling. R tib/fib ttp. Otherwise denies ttp of BUE and BLE with no pain with passive rom of major joints aside from areas of reported ttp or known fx's.    Results for orders placed or performed during the hospital encounter of 02/03/24 (from the past 48 hours)  Basic metabolic panel     Status: Abnormal   Collection Time: 02/03/24  8:53 AM  Result Value Ref Range   Sodium 129 (L) 135 - 145 mmol/L   Potassium 4.0 3.5 - 5.1 mmol/L   Chloride 93 (L) 98 - 111 mmol/L   CO2 26 22 - 32 mmol/L   Glucose, Bld 129 (H) 70 - 99 mg/dL    Comment: Glucose reference range applies only to samples taken after fasting for at least 8 hours.   BUN 10 8 - 23 mg/dL   Creatinine, Ser 9.40 0.44 - 1.00 mg/dL   Calcium  9.0 8.9 - 10.3 mg/dL   GFR, Estimated >39 >39 mL/min    Comment: (NOTE) Calculated using the CKD-EPI Creatinine Equation (2021)    Anion gap 10 5 - 15    Comment: Performed at [facility], [address], [place], [place] [address]  CBC with Differential     Status: Abnormal   Collection Time: 02/03/24   8:53 AM  Result Value Ref Range   WBC 12.0 (H) 4.0 - 10.5 K/uL   RBC 4.15 3.87 - 5.11 MIL/uL   Hemoglobin 12.9 12.0 - 15.0 g/dL   HCT 60.4 63.9 - 53.9 %   MCV 95.2 80.0 - 100.0 fL   MCH 31.1 26.0 - 34.0 pg   MCHC 32.7 30.0 - 36.0 g/dL   RDW 86.2 88.4 - 84.4 %   Platelets 157 150 - 400 K/uL   nRBC 0.0 0.0 - 0.2 %   Neutrophils Relative % 84 %   Neutro Abs 10.1 (  H) 1.7 - 7.7 K/uL   Lymphocytes Relative 8 %   Lymphs Abs 1.0 0.7 - 4.0 K/uL   Monocytes Relative 6 %   Monocytes Absolute 0.7 0.1 - 1.0 K/uL   Eosinophils Relative 1 %   Eosinophils Absolute 0.2 0.0 - 0.5 K/uL   Basophils Relative 0 %   Basophils Absolute 0.0 0.0 - 0.1 K/uL   Immature Granulocytes 1 %   Abs Immature Granulocytes 0.06 0.00 - 0.07 K/uL    Comment: Performed at [facility], [address], [place], [place] [address]   DG Pelvis Portable Result Date: 02/03/2024 EXAM: XR Pelvis, 1 or 2 View. CLINICAL HISTORY: fall, CP COMPARISON: None provided. FINDINGS: BONES: No acute fracture or focal osseous lesion. JOINTS: No dislocation. The joint spaces are normal. SOFT TISSUES: The soft tissues are unremarkable. IMPRESSION: No acute osseous abnormality. Electronically signed by: Lynwood Seip MD 02/03/2024 10:29 AM EST RP Workstation: [id number]   DG Knee Complete 4 Views Left Result Date: 02/03/2024 EXAM: XR Left Knee, 4 Views. CLINICAL HISTORY: fall, CP COMPARISON: 07/20/2021 narrowing of lateral joint space is noted. FINDINGS: BONES: No acute fracture or focal osseous lesion. JOINTS: No dislocation. Narrowing of lateral joint space is noted. SOFT TISSUES: The soft tissues are unremarkable. IMPRESSION: 1. No acute osseous abnormality. 2. Lateral compartment joint space narrowing, compatible with osteoarthritis. Electronically signed by: Lynwood Seip MD 02/03/2024 10:25 AM EST RP Workstation: [id number]   DG Wrist Complete Left Result Date: 02/03/2024 EXAM: XR Left Wrist, 3 or more View. CLINICAL HISTORY: fall, CP  COMPARISON: 10/19/2009 FINDINGS: BONES: Nondisplaced distal left radial fracture is noted. JOINTS: No dislocation. Severe degenerative change is seen involving the first carpometacarpal joint. Other visualized joint spaces appear maintained. SOFT TISSUES: The soft tissues are unremarkable. IMPRESSION: 1. Nondisplaced distal left radial fracture. 2. Severe degenerative change involving the first carpometacarpal joint. Electronically signed by: Lynwood Seip MD 02/03/2024 10:22 AM EST RP Workstation: [id number]   DG Chest Port 1 View Result Date: 02/03/2024 EXAM: XR Chest, 1 View(s). CLINICAL HISTORY: fall, CP COMPARISON: None available. FINDINGS: LUNGS: Chronic interstitial coarsening. No consolidation. PLEURAL SPACES: No pleural effusion or pneumothorax. HEART: The heart size is normal. Aortic atherosclerotic calcification. BONES: Bilateral shoulder degenerative changes. Remote right proximal humerus fracture. Hiatal hernia. IMPRESSION: 1. No acute cardiopulmonary abnormality. Electronically signed by: Norleen Boxer MD 02/03/2024 10:18 AM EST RP Workstation: [id number]   DG Elbow Complete Right Result Date: 02/03/2024 CLINICAL DATA:  Fall.  Pain. EXAM: RIGHT ELBOW - COMPLETE 3+ VIEW COMPARISON:  None Available. FINDINGS: There is no evidence of fracture, dislocation, or joint effusion. There is no evidence of arthropathy or other focal bone abnormality. Soft tissues are unremarkable. IMPRESSION: Negative. Electronically Signed   By: Camellia Candle M.D.   On: 02/03/2024 10:11   CT Maxillofacial WO CM Result Date: 02/03/2024 EXAM: CT Maxillofacial without Intravenous Contrast. CLINICAL HISTORY: 89 year old female status post fall at home, found down. TECHNIQUE: Axial computed tomography images of the face without intravenous contrast. Sagittal and coronal reformations performed. Dose reduction technique was used including one or more of the following: automated exposure control, adjustment of mA and kV  according to patient size, and/or iterative reconstruction. CONTRAST: Without. COMPARISON: Head and cervical spine CT 02/03/2024 reported separately. FINDINGS: BONES: The mandible is intact and aligned, with chronic temporomandibular joint (TMJ) degeneration greater on the right side. Comminuted bilateral pterygoid plates, comminuted fracture of the right maxillary sinus anterior and posterior walls with partial involvement of the right maxillary  alveolus, and comminuted fracture of the medial wall of the right maxillary sinus involving the right nasal cavity. comminuted fracture of the bony nasal septum deviated to the left, distracted fracture of the left nasal bone extending to the left nasofrontal confluence, nondisplaced fracture at the right zygomaticomaxillary confluence, obliquely displaced fracture of the left zygomaticomaxillary confluence extending toward the left orbital floor (coronal image 24), There is a nondisplaced fracture of the right orbital floor. No zygomatic arch fracture. The hard palate remains intact. No central skull base fracture identified. SOFT TISSUES: There is combined right buccal and masticator space hematoma and posttraumatic soft tissue gas. Generalized right face soft tissue swelling. Negative visible non-contrast larynx and oropharynx. There is posttraumatic contusion and stranding in the right parapharyngeal space. Negative left parapharyngeal and retropharyngeal spaces. Negative left sublingual space. Ptosis of the right submandibular gland. SINUSES: There is hemorrhage in the maxillary sinuses, right greater than left. There is hemorrhage in the nasal cavity. The frontal and sphenoid sinuses demonstrate only trace layering fluid or hemorrhage. There is hemorrhage / fluid scattered in the ethmoid air cells. There is retained blood or secretions in the nasopharynx mostly on the right. Tympanic cavities and mastoids are well aerated. ORBITS: Minimal orbital wall involvement,  specifically a nondisplaced fracture of the right orbital floor. The other bilateral orbit walls appear intact. Globes appear symmetric and intact. No intraorbital gas or hemorrhage. IMPRESSION: 1. Variation of [name] pattern of the bilateral facial bones, including Advanced comminution of the right maxillary sinus. Comminuted and displaced nasal septum. Minimal orbital wall involvement and no intraorbital gas or hemorrhage. 2. Hemorrhage in the paranasal sinuses, especially the right maxillary. Retained blood or secretions in the nasopharynx, mostly on the right. 3. Traumatic soft tissue space injury: right buccal, masticator, and parapharyngeal spaces. 4. No mandible fracture or dislocation. No convincing central skull base fracture. Electronically signed by: Helayne Hurst MD 02/03/2024 08:15 AM EST RP Workstation: [id number]   CT Cervical Spine Wo Contrast Result Date: 02/03/2024 EXAM: CT Cervical Spine Without Intravenous Contrast. CLINICAL HISTORY: 89 year old female with neck trauma following a fall at home, found face down. TECHNIQUE: Axial computed tomography images of the cervical spine without intravenous contrast. Sagittal and coronal reformations performed. Dose reduction technique was used including one or more of the following: automated exposure control, adjustment of mA and kV according to patient size, and/or iterative reconstruction. COMPARISON: CT face and CT head 02/03/2024, reported separately. FINDINGS: BONES: No acute osseous abnormality identified in the cervical spine. Bony alignment: Anterolisthesis of C4 on C5 measures 5 mm but appears to be degenerative in nature. Mild degenerative appearing anterolisthesis of C7 on T1. Central skull base, craniocervical junction alignment, and C1-C2 alignment are maintained. Chronic severe asymmetric left-sided C1-C2 degeneration. Upper cervical facet arthropathy predominantly on the left. Lower cervical degenerative endplate spurring.  Visible upper thoracic levels appear intact. DISCS / DEGENERATIVE CHANGES: Widespread cervical disc bulging. No significant cervical spinal stenosis by CT. SOFT TISSUES: No prevertebral soft tissue swelling. Centrilobular emphysema in the visible lung apices. Calcified aortic arch atherosclerosis. Partially visible chronic degenerative changes at the right shoulder. Abnormal face is reported separately. IMPRESSION: 1. No acute cervical spine fracture or dislocation. 2. Pronounced but degenerative appearing anterolisthesis of C4 on C5 (5 mm). Mild degenerative anterolisthesis of C7 on T1. Other advanced cervical degeneration. 3. Abnormal Face reported separately. 4. Emphysema. Electronically signed by: Helayne Hurst MD 02/03/2024 08:00 AM EST RP Workstation: [id number]   CT Head Wo Contrast Result Date:  02/03/2024 EXAM: CT Head Without Intravenous Contrast. CLINICAL HISTORY: 89 year old female status post fall at home, found face down. TECHNIQUE: Axial computed tomography images of the head/brain without intravenous contrast. Dose reduction technique was used including one or more of the following: automated exposure control, adjustment of mA and kV according to patient size, and/or iterative reconstruction. CONTRAST: Without. COMPARISON: CT head 01/24/2023. FINDINGS: BRAIN: Trace anterior convexity subdural blood bilaterally on series 13 images 57 and 59, 2 mm (MBIG 1). No other acute intracranial hemorrhage identified. No mass lesion. No CT evidence for acute territorial infarct. No midline shift or extra-axial collection. No intracranial mass effect. No suspicious intracranial vascular hyperdensity. Stable gray white differentiation. Mild for age chronic periventricular white matter changes. Brain volume is stable last year, with minimal limits for age. Small volume of gas in the right cavernous sinus could be posttraumatic or IV access related. There is no other convincing pneumocephalus. VENTRICLES: No  hydrocephalus. ORBITS: The orbits are unremarkable. SINUSES AND MASTOIDS: Associated hemorrhage in the paranasal sinuses, especially the right maxillary sinus. Tympanic cavities and mastoids remain well aerated. SOFT TISSUES: No discrete scalp soft tissue injury. Posttraumatic gas in the visible right face. No radiopaque foreign body is seen. BONES: No acute skull fracture. Multiple acute facial fractures are detailed separately today. CT face and cervical spine today reported separately. Traumatic brain injury risk stratification: Skull fracture: No (low - MBIG 1) Subdural hematoma (SDH): <4 mm (MBIG 1) (Trace anterior convexity subdural blood bilaterally on series 13 images 57 and 59, 2 mm) Subarachnoid hemorrhage [facility]): No Epidural hematoma (EDH): No (low - MBIG 1) Cerebral contusion, intra-axial, intraparenchymal hemorrhage (IPH): No Intraventricular hemorrhage (IVH): No (low - MBIG 1) Midline shift > 1mm or edema/effacement of sulci/vents: No (low - MBIG 1) IMPRESSION: 1. Trace bilateral anterior convexity subdural hematoma (2 mm). No other acute intracranial hemorrhage identified. 2. Multiple acute facial fractures, CT Face reported separately. 3. No other acute intracranial abnormality.  No calvarium fracture identified Electronically signed by: Helayne Hurst MD 02/03/2024 07:55 AM EST RP Workstation: [id number]    Anti-infectives (From admission, onward)    Start     Dose/Rate Route Frequency Ordered Stop   02/03/24 0900  clindamycin  (CLEOCIN ) IVPB 900 mg        900 mg 100 mL/hr over 30 Minutes Intravenous  Once 02/03/24 0855 02/03/24 1029       Assessment/Plan GLF TBI/B SDH - Per NSGY, Dr. Lanis, [facility] in [facility], Keppra, neuro checks, TBI therapies.  [name] w/ R epistaxis - Per ENT, Dr. Roark L distal radial fx - Per Hand surgery, Dr. Lorretta Sternal pain - EKG sinus. Tele. CXR w/o acute abnormalities. Can hold on CT CAP for now.  R tib/fib pain - xray neg Hx Hypothyroidism -  home meds Hx Takotsubo's cardiomyopathy  Hx chronic pain - on Percocet 2.5-5mg  prn at baseline  FEN - NPO till consultant final recs. If okay for a diet, plan either FLD or mechanical soft diet pending ENT recs w/ facial fx's. mIVF VTE - SCDs, chem ppx on hold till cleared by NSGY ID - None currently Foley - I/O now, bladder scan in 6 hours if unable to void Dispo - Admit to 4NP.   I reviewed nursing notes, last 24 h vitals and pain scores, last 48 h intake and output, last 24 h labs and trends, and last 24 h imaging results.   Ozell CHRISTELLA Shaper, [facility] 02/03/2024, 10:50 AM Please see  Amion for pager number during day hours 7:00am-4:30pm      [1]  Allergies Allergen Reactions   Aspirin      But taking low dose therapy without problems.   Nystatin  Itching    She said when she used the cream it made symptoms worse    Penicillins Rash

## 2024-02-04 ENCOUNTER — Inpatient Hospital Stay (HOSPITAL_COMMUNITY)

## 2024-02-04 LAB — BASIC METABOLIC PANEL WITH GFR
Anion gap: 7 (ref 5–15)
BUN: 16 mg/dL (ref 8–23)
CO2: 24 mmol/L (ref 22–32)
Calcium: 8.1 mg/dL — ABNORMAL LOW (ref 8.9–10.3)
Chloride: 99 mmol/L (ref 98–111)
Creatinine, Ser: 0.67 mg/dL (ref 0.44–1.00)
GFR, Estimated: >60 mL/min (ref >60)
Glucose, Bld: 109 mg/dL — ABNORMAL HIGH (ref 70–99)
Potassium: 4.2 mmol/L (ref 3.5–5.1)
Sodium: 130 mmol/L — ABNORMAL LOW (ref 135–145)

## 2024-02-04 LAB — CBC
HCT: 34.8 % — ABNORMAL LOW (ref 36.0–46.0)
Hemoglobin: 11.4 g/dL — ABNORMAL LOW (ref 12.0–15.0)
MCH: 31.1 pg (ref 26.0–34.0)
MCHC: 32.8 g/dL (ref 30.0–36.0)
MCV: 94.8 fL (ref 80.0–100.0)
Platelets: 133 K/uL — ABNORMAL LOW (ref 150–400)
RBC: 3.67 MIL/uL — ABNORMAL LOW (ref 3.87–5.11)
RDW: 13.8 % (ref 11.5–15.5)
WBC: 11.1 K/uL — ABNORMAL HIGH (ref 4.0–10.5)
nRBC: 0 % (ref 0.0–0.2)

## 2024-02-04 MED ORDER — TAMSULOSIN HCL 0.4 MG PO CAPS
0.4000 mg | ORAL_CAPSULE | Freq: Every day | ORAL | Status: DC
  Administered 2024-02-04 – 2024-02-11 (×7): 0.4 mg via ORAL
  Filled 2024-02-04 (×7): qty 1

## 2024-02-04 MED ORDER — CHLORHEXIDINE GLUCONATE CLOTH 2 % EX PADS
6.0000 | MEDICATED_PAD | Freq: Every day | CUTANEOUS | Status: DC
  Administered 2024-02-04 – 2024-02-07 (×4): 6 via TOPICAL

## 2024-02-04 MED ORDER — IOHEXOL 350 MG/ML SOLN
75.0000 mL | Freq: Once | INTRAVENOUS | Status: AC | PRN
  Administered 2024-02-04: 75 mL via INTRAVENOUS

## 2024-02-04 NOTE — Evaluation (Signed)
 Clinical/Bedside Swallow Evaluation Patient Details  Name: Charlene Vincent MRN: [id number] Date of Birth: February 12, 1935  Today's Date: 02/04/2024 Time: SLP Start Time (ACUTE ONLY): 0931 SLP Stop Time (ACUTE ONLY): 9047 SLP Time Calculation (min) (ACUTE ONLY): 21 min  Past Medical History:  Past Medical History:  Diagnosis Date   Anemia    Arthritis    Cellulitis    Costochondritis    Headache    Hypothyroidism    Myocardial infarction (HCC) 2012, 2016   both Takotsubo   Pneumonia    Shingles    Takotsubo cardiomyopathy    a. 2012  b. LHC in 09/2014 nl cors, EF 30-35%>>45-50% by echo 11/2014   Tremor, essential    Past Surgical History:  Past Surgical History:  Procedure Laterality Date   ABDOMINAL HYSTERECTOMY     APPLICATION OF A-CELL OF EXTREMITY Left 01/22/2015   Procedure: APPLICATION OF A-CELL TO LEFT THIGH ;  Surgeon: Earlis Ranks, MD;  Location: [facility] OR;  Service: Plastics;  Laterality: Left;   APPLICATION OF WOUND VAC Left 10/09/2014   Procedure: APPLICATION OF WOUND VAC;  Surgeon: Glendia Cordella Hutchinson, MD;  Location: [facility] OR;  Service: Orthopedics;  Laterality: Left;   CARDIAC CATHETERIZATION  September 10, 2010   EF 35 to 40%. No significant CAD. ? takotsubo cardiomyopathy   CARDIAC CATHETERIZATION N/A 10/02/2014   Procedure: Left Heart Cath and Coronary Angiography;  Surgeon: Peter M Jordan, MD;  Location: [facility] INVASIVE CV LAB;  Service: Cardiovascular;  Laterality: N/A;   I & D EXTREMITY Left 10/09/2014   Procedure: DEBRIDEMENT LEFT FOOT;  Surgeon: Glendia Cordella Hutchinson, MD;  Location: [facility] OR;  Service: Orthopedics;  Laterality: Left;   INGUINAL HERNIA REPAIR     SKIN FULL THICKNESS GRAFT Left 10/12/2014   Procedure: Debridement left foot, application integra, wound vacuum-assisted closure;  Surgeon: Earlis Ranks, MD;  Location: [facility] OR;  Service: Plastics;  Laterality: Left;   SKIN GRAFT Left 01/22/2015   left thigh to left foot   SKIN SPLIT GRAFT Left 01/22/2015   Procedure:   SPLIT THICKNESS SKIN GRAFT FROM LEFT THIGH TO LEFT FOOT ;  Surgeon: Earlis Ranks, MD;  Location: [facility] OR;  Service: Plastics;  Laterality: Left;   HPI:  Charlene Vincent is an 89 yo presenting 12/11 s/p GLF at home. Work up revealed TBI/B SDH, [place] fx with R epistaxis, and L distal radial fx. Previous clinical swallow eval in 2023 was [facility]. PMH includes: hypothyroidism, Takotsubo CM, prior MI, PNA    Assessment / Plan / Recommendation  Clinical Impression  Pt presents with primarily oral symptoms of dysphagia secondary to traumatic injuries, although there is risk for secondary pharyngeal deficits. Recommend starting with Dys 1 (puree) diet and nectar thick liquids by cup (could try spoon). In between meals, after oral care, could siphon small amounts of thin water via straw to further provide moisture.  Pt's mouth is diffusely coated in dried blood. Oral care helped remove a lot from her tongue and palate, with more removed after PO trials, but this will require ongoing oral care to manage. Her upper lip is edematous and the R side of her face does not move as much as her L side does. She does not achieve labial seal, resulting in thin liquids spilling in large amounts out of the R side of her mouth when given via spoon or cup. A cough was only noted x1 with cup sip, suspect related to limited oral control resulting in premature spillage.  She does not achieve any sucking via straw. She keeps most of the liquid in her mouth when siphoned via straw, but this only provides minimal volume at a time. Pt has improved oral containment with no anterior loss, and full oral clearance, when given nectar thick liquids via cup or spoonfuls of puree.   SLP Visit Diagnosis: Dysphagia, unspecified (R13.10)      Swallow Evaluation Recommendations Recommendations: PO diet PO Diet Recommendation: Dysphagia 1 (Pureed);Mildly thick liquids (Level 2, nectar thick) Liquid Administration via: Cup Medication  Administration: Crushed with puree Supervision: Staff to assist with self-feeding;Full supervision/cueing for swallowing strategies Swallowing strategies  : Minimize environmental distractions;Slow rate;Small bites/sips;Check for pocketing or oral holding;Check for anterior loss Postural changes: Position pt fully upright for meals Oral care recommendations: Oral care BID (2x/day);Oral care before ice chips/water   Assistance Recommended at Discharge    Functional Status Assessment Patient has had a recent decline in their functional status and demonstrates the ability to make significant improvements in function in a reasonable and predictable amount of time.  Frequency and Duration min 2x/week  2 weeks       Prognosis Prognosis for improved oropharyngeal function: Good Barriers to Reach Goals: Cognitive deficits      Swallow Study   General HPI: Charlene Vincent is an 89 yo presenting 12/11 s/p GLF at home. Work up revealed TBI/B SDH, [place] fx with R epistaxis, and L distal radial fx. Previous clinical swallow eval in 2023 was [facility]. PMH includes: hypothyroidism, Takotsubo CM, prior MI, PNA Type of Study: Bedside Swallow Evaluation Previous Swallow Assessment: see HPI Diet Prior to this Study: NPO Temperature Spikes Noted: No Respiratory Status: Room air History of Recent Intubation: No Behavior/Cognition: Lethargic/Drowsy;Cooperative;Requires cueing;Other (Comment) (HOH) Oral Cavity Assessment: Other (comment) (dried blood throughout oral cavity and mouth) Oral Care Completed by SLP: Yes Oral Cavity - Dentition: Edentulous;Dentures, not available Self-Feeding Abilities: Total assist Patient Positioning: Upright in bed Baseline Vocal Quality: Normal Volitional Cough: Weak Volitional Swallow: Able to elicit    Oral/Motor/Sensory Function Overall Oral Motor/Sensory Function: Moderate impairment Facial ROM: Reduced right Facial Symmetry: Abnormal symmetry right Lingual ROM:  Within Functional Limits Lingual Symmetry: Within Functional Limits   Ice Chips Ice chips: Not tested   Thin Liquid Thin Liquid: Impaired Presentation: Cup;Straw;Spoon Oral Phase Impairments: Reduced labial seal Oral Phase Functional Implications: Right anterior spillage Pharyngeal  Phase Impairments: Cough - Immediate    Nectar Thick Nectar Thick Liquid: Impaired Presentation: Cup Oral Phase Impairments: Reduced labial seal   Honey Thick Honey Thick Liquid: Not tested   Puree Puree: Impaired Presentation: Spoon Oral Phase Impairments: Reduced labial seal   Solid     Solid: Not tested      Leita SAILOR., M.A. CCC-SLP Acute Rehabilitation Services Office: [phone]  Secure chat preferred  02/04/2024,10:51 AM

## 2024-02-04 NOTE — ED Notes (Signed)
 Pt continually reporting needing to urinate but unable to. Bladder scan 70mL

## 2024-02-04 NOTE — ED Notes (Signed)
 Pt attempting to get out of bed and agitated that she is unable to urinate but has the urge to go. Pt was bladder scanned but still reports being unable to go after trying. Trauma MD called and made aware pt has been unable to urinate since last documented I/O cath 12/11 AM. Trauma MD instructs on foley insertion. RN and NT placed foley and pt reports relief.

## 2024-02-04 NOTE — ED Notes (Signed)
 When you have time, Madison Direnzo (son) [phone] would like to speak. Thank you

## 2024-02-04 NOTE — ED Notes (Signed)
 Patient family member observed walking around in the hall. Family member approached this RN and asked if she was the patient's RN and RN replied no and asked how she could help. The family member was unable to tell the RN what room she came from or the patient's name. Family member appears disheveled and is wearing dirty clothes.   RN followed family member to room where family member stated that the patient was real bad and stood outside the room trying to push the RN in the room. RN stated that the patient had been seen and tests were ran and the patient was being admitted to the hospital and was being taken care of. The family member again stated that the patient was real bad. Family in the room stated that the family member in the hall was his aunt but offered no assistance in having her return to the room. RN informed the family in the room that the patient is waiting for a room upstairs and that she would make the primary RN aware that he wanted to know if the provider had been by today. Patient appeared to be sleeping and comfortable but does have bruising to face and head. When RN left the room the female family member had stopped another staff member and asked them if they were a doctor and stated again that the patient was real bad.

## 2024-02-04 NOTE — Progress Notes (Signed)
°  Media Information   Document Information  Photographic Image: Photos  Younger  02/04/2024 17:53  Attached To:  Hospital Encounter on 01/31/24  Source Information  Charlott Pott, RN  [facility]     Media Information   Document Information  Photographic Image: Photos  Backside  02/04/2024 17:48  Attached To:  Hospital Encounter on 01/31/24  Source Information  Charlott Pott, RN  [facility]     Media Information   Document Information  Photographic Image: Photos  Legs  02/04/2024 17:47  Attached To:  Hospital Encounter on 01/31/24  Source Information  Charlott Pott, RN  [facility]     Media Information   Document Information  Photographic Image: Photos  Face and neck  02/04/2024 17:46  Attached To:  Hospital Encounter on 01/31/24  Source Information  Charlott Pott, RN  [facility]

## 2024-02-04 NOTE — Progress Notes (Addendum)
 [facility] Progress Note     Subjective: CC:  Alert. Cc is getting dried blood off of her face. Denies pain. Oriented to self only - not to time or situation. Following commands and appropriate speech HOH   Objective: Vital signs in last 24 hours: Temp:  [97.1 F (36.2 C)-98.4 F (36.9 C)] 97.8 F (36.6 C) (12/12 0721) Pulse Rate:  [56-91] 66 (12/12 0745) Resp:  [9-23] 14 (12/12 0745) BP: (77-165)/(50-109) 132/86 (12/12 0745) SpO2:  [90 %-100 %] 96 % (12/12 0745)    Intake/Output from previous day: 12/11 0701 - 12/12 0700 In: -  Out: 275 [Urine:275] Intake/Output this shift: No intake/output data recorded.  PE: Gen:  Alert, NAD, pleasant and cooperative HEENT: periorbital and facial ecchymosis with swelling of the midface and jaw area. No large expanding hematoma. Some ecchymosis evolving down right side of neck as well - soft. Trachea is midline. Dried blood R>L nares and mouth. No active bleeding. Card:  Regular rate and rhythm, pedal pulses 2+ BL, no lower extremity edema, chronic skin changes of lower extremities Pulm:  Normal effort ORA  Abd: Soft, non-tender, non-distendedd GU: foley in place draining clear, yellow urine Skin: warm and dry, no rashes  Psych: A&Ox1  Neuro: non-focal exam, appropriate speech, sometimes difficult to understand due to facial fractures and no teeth.  Lab Results:  Recent Labs    02/03/24 0853 02/04/24 0316  WBC 12.0* 11.1*  HGB 12.9 11.4*  HCT 39.5 34.8*  PLT 157 133*   BMET Recent Labs    02/03/24 0853 02/04/24 0316  NA 129* 130*  K 4.0 4.2  CL 93* 99  CO2 26 24  GLUCOSE 129* 109*  BUN 10 16  CREATININE 0.59 0.67  CALCIUM  9.0 8.1*   PT/INR No results for input(s): LABPROT, INR in the last 72 hours. CMP     Component Value Date/Time   NA 130 (L) 02/04/2024 0316   NA 143 12/28/2017 1538   K 4.2 02/04/2024 0316   CL 99 02/04/2024 0316   CO2 24 02/04/2024 0316   GLUCOSE 109 (H) 02/04/2024 0316    BUN 16 02/04/2024 0316   BUN 11 12/28/2017 1538   CREATININE 0.67 02/04/2024 0316   CREATININE 0.60 03/14/2015 1514   CALCIUM  8.1 (L) 02/04/2024 0316   PROT 6.4 (L) 06/12/2023 0848   ALBUMIN 3.4 (L) 06/12/2023 0848   AST 30 06/12/2023 0848   ALT 25 06/12/2023 0848   ALKPHOS 74 06/12/2023 0848   BILITOT 0.6 06/12/2023 0848   GFRNONAA >60 02/04/2024 0316   GFRAA 85 12/28/2017 1538   Lipase     Component Value Date/Time   LIPASE 59 (H) 06/12/2023 0848       Studies/Results: CT Head Wo Contrast Result Date: 02/03/2024 CLINICAL DATA:  Subdural hematoma EXAM: CT HEAD WITHOUT CONTRAST TECHNIQUE: Contiguous axial images were obtained from the base of the skull through the vertex without intravenous contrast. RADIATION DOSE REDUCTION: This exam was performed according to the departmental dose-optimization program which includes automated exposure control, adjustment of the mA and/or kV according to patient size and/or use of iterative reconstruction technique. COMPARISON:  02/03/2024 FINDINGS: Brain: No evidence of acute infarct or hemorrhage. The trace subdural hematoma along the anterior convexity noted on prior study is no longer visualized. No new areas of hemorrhage are identified. The lateral ventricles and midline structures are stable. No mass effect. Vascular: No hyperdense vessel or unexpected calcification. Skull: Normal. Negative for fracture or focal lesion. Sinuses/Orbits:  Please refer to prior facial CT report describing extensive bilateral facial bone fractures, most pronounced within the right maxillary sinus. Blood products opacify the right maxillary sinus and layer dependently within the left maxillary sinus. Other: None. IMPRESSION: 1. The trace bilateral subdural hematoma along the anterior convexities noted on prior exam is no longer visualized on this study. 2. No acute intracranial hemorrhage.  No acute infarct. 3. Please refer to prior CT facial bone exam describing  extensive bilateral facial bone fractures. Electronically Signed   By: Ozell Daring M.D.   On: 02/03/2024 17:00   DG Tibia/Fibula Right Port Result Date: 02/03/2024 CLINICAL DATA:  Fall with right lower leg pain. EXAM: PORTABLE RIGHT TIBIA AND FIBULA - 2 VIEW COMPARISON:  None Available. FINDINGS: Mild diffuse decreased bone mineralization. No acute fracture or dislocation. Soft tissues unremarkable. IMPRESSION: No acute findings. Electronically Signed   By: Toribio Agreste M.D.   On: 02/03/2024 12:38   DG Pelvis Portable Result Date: 02/03/2024 EXAM: XR Pelvis, 1 or 2 View. CLINICAL HISTORY: fall, CP COMPARISON: None provided. FINDINGS: BONES: No acute fracture or focal osseous lesion. JOINTS: No dislocation. The joint spaces are normal. SOFT TISSUES: The soft tissues are unremarkable. IMPRESSION: No acute osseous abnormality. Electronically signed by: Lynwood Seip MD 02/03/2024 10:29 AM EST RP Workstation: [id number]   DG Knee Complete 4 Views Left Result Date: 02/03/2024 EXAM: XR Left Knee, 4 Views. CLINICAL HISTORY: fall, CP COMPARISON: 07/20/2021 narrowing of lateral joint space is noted. FINDINGS: BONES: No acute fracture or focal osseous lesion. JOINTS: No dislocation. Narrowing of lateral joint space is noted. SOFT TISSUES: The soft tissues are unremarkable. IMPRESSION: 1. No acute osseous abnormality. 2. Lateral compartment joint space narrowing, compatible with osteoarthritis. Electronically signed by: Lynwood Seip MD 02/03/2024 10:25 AM EST RP Workstation: [id number]   DG Wrist Complete Left Result Date: 02/03/2024 EXAM: XR Left Wrist, 3 or more View. CLINICAL HISTORY: fall, CP COMPARISON: 10/19/2009 FINDINGS: BONES: Nondisplaced distal left radial fracture is noted. JOINTS: No dislocation. Severe degenerative change is seen involving the first carpometacarpal joint. Other visualized joint spaces appear maintained. SOFT TISSUES: The soft tissues are unremarkable. IMPRESSION: 1. Nondisplaced  distal left radial fracture. 2. Severe degenerative change involving the first carpometacarpal joint. Electronically signed by: Lynwood Seip MD 02/03/2024 10:22 AM EST RP Workstation: [id number]   DG Chest Port 1 View Result Date: 02/03/2024 EXAM: XR Chest, 1 View(s). CLINICAL HISTORY: fall, CP COMPARISON: None available. FINDINGS: LUNGS: Chronic interstitial coarsening. No consolidation. PLEURAL SPACES: No pleural effusion or pneumothorax. HEART: The heart size is normal. Aortic atherosclerotic calcification. BONES: Bilateral shoulder degenerative changes. Remote right proximal humerus fracture. Hiatal hernia. IMPRESSION: 1. No acute cardiopulmonary abnormality. Electronically signed by: Norleen Boxer MD 02/03/2024 10:18 AM EST RP Workstation: [id number]   DG Elbow Complete Right Result Date: 02/03/2024 CLINICAL DATA:  Fall.  Pain. EXAM: RIGHT ELBOW - COMPLETE 3+ VIEW COMPARISON:  None Available. FINDINGS: There is no evidence of fracture, dislocation, or joint effusion. There is no evidence of arthropathy or other focal bone abnormality. Soft tissues are unremarkable. IMPRESSION: Negative. Electronically Signed   By: Camellia Candle M.D.   On: 02/03/2024 10:11   CT Maxillofacial WO CM Result Date: 02/03/2024 EXAM: CT Maxillofacial without Intravenous Contrast. CLINICAL HISTORY: 89 year old female status post fall at home, found down. TECHNIQUE: Axial computed tomography images of the face without intravenous contrast. Sagittal and coronal reformations performed. Dose reduction technique was used including one or more of the following: automated exposure control, adjustment of  mA and kV according to patient size, and/or iterative reconstruction. CONTRAST: Without. COMPARISON: Head and cervical spine CT 02/03/2024 reported separately. FINDINGS: BONES: The mandible is intact and aligned, with chronic temporomandibular joint (TMJ) degeneration greater on the right side. Comminuted bilateral pterygoid plates,  comminuted fracture of the right maxillary sinus anterior and posterior walls with partial involvement of the right maxillary alveolus, and comminuted fracture of the medial wall of the right maxillary sinus involving the right nasal cavity. comminuted fracture of the bony nasal septum deviated to the left, distracted fracture of the left nasal bone extending to the left nasofrontal confluence, nondisplaced fracture at the right zygomaticomaxillary confluence, obliquely displaced fracture of the left zygomaticomaxillary confluence extending toward the left orbital floor (coronal image 24), There is a nondisplaced fracture of the right orbital floor. No zygomatic arch fracture. The hard palate remains intact. No central skull base fracture identified. SOFT TISSUES: There is combined right buccal and masticator space hematoma and posttraumatic soft tissue gas. Generalized right face soft tissue swelling. Negative visible non-contrast larynx and oropharynx. There is posttraumatic contusion and stranding in the right parapharyngeal space. Negative left parapharyngeal and retropharyngeal spaces. Negative left sublingual space. Ptosis of the right submandibular gland. SINUSES: There is hemorrhage in the maxillary sinuses, right greater than left. There is hemorrhage in the nasal cavity. The frontal and sphenoid sinuses demonstrate only trace layering fluid or hemorrhage. There is hemorrhage / fluid scattered in the ethmoid air cells. There is retained blood or secretions in the nasopharynx mostly on the right. Tympanic cavities and mastoids are well aerated. ORBITS: Minimal orbital wall involvement, specifically a nondisplaced fracture of the right orbital floor. The other bilateral orbit walls appear intact. Globes appear symmetric and intact. No intraorbital gas or hemorrhage. IMPRESSION: 1. Variation of [name] pattern of the bilateral facial bones, including Advanced comminution of the right maxillary  sinus. Comminuted and displaced nasal septum. Minimal orbital wall involvement and no intraorbital gas or hemorrhage. 2. Hemorrhage in the paranasal sinuses, especially the right maxillary. Retained blood or secretions in the nasopharynx, mostly on the right. 3. Traumatic soft tissue space injury: right buccal, masticator, and parapharyngeal spaces. 4. No mandible fracture or dislocation. No convincing central skull base fracture. Electronically signed by: Helayne Hurst MD 02/03/2024 08:15 AM EST RP Workstation: [id number]   CT Cervical Spine Wo Contrast Result Date: 02/03/2024 EXAM: CT Cervical Spine Without Intravenous Contrast. CLINICAL HISTORY: 89 year old female with neck trauma following a fall at home, found face down. TECHNIQUE: Axial computed tomography images of the cervical spine without intravenous contrast. Sagittal and coronal reformations performed. Dose reduction technique was used including one or more of the following: automated exposure control, adjustment of mA and kV according to patient size, and/or iterative reconstruction. COMPARISON: CT face and CT head 02/03/2024, reported separately. FINDINGS: BONES: No acute osseous abnormality identified in the cervical spine. Bony alignment: Anterolisthesis of C4 on C5 measures 5 mm but appears to be degenerative in nature. Mild degenerative appearing anterolisthesis of C7 on T1. Central skull base, craniocervical junction alignment, and C1-C2 alignment are maintained. Chronic severe asymmetric left-sided C1-C2 degeneration. Upper cervical facet arthropathy predominantly on the left. Lower cervical degenerative endplate spurring. Visible upper thoracic levels appear intact. DISCS / DEGENERATIVE CHANGES: Widespread cervical disc bulging. No significant cervical spinal stenosis by CT. SOFT TISSUES: No prevertebral soft tissue swelling. Centrilobular emphysema in the visible lung apices. Calcified aortic arch atherosclerosis. Partially visible chronic  degenerative changes at the right shoulder. Abnormal face  is reported separately. IMPRESSION: 1. No acute cervical spine fracture or dislocation. 2. Pronounced but degenerative appearing anterolisthesis of C4 on C5 (5 mm). Mild degenerative anterolisthesis of C7 on T1. Other advanced cervical degeneration. 3. Abnormal Face reported separately. 4. Emphysema. Electronically signed by: Helayne Hurst MD 02/03/2024 08:00 AM EST RP Workstation: [id number]   CT Head Wo Contrast Result Date: 02/03/2024 EXAM: CT Head Without Intravenous Contrast. CLINICAL HISTORY: 89 year old female status post fall at home, found face down. TECHNIQUE: Axial computed tomography images of the head/brain without intravenous contrast. Dose reduction technique was used including one or more of the following: automated exposure control, adjustment of mA and kV according to patient size, and/or iterative reconstruction. CONTRAST: Without. COMPARISON: CT head 01/24/2023. FINDINGS: BRAIN: Trace anterior convexity subdural blood bilaterally on series 13 images 57 and 59, 2 mm (MBIG 1). No other acute intracranial hemorrhage identified. No mass lesion. No CT evidence for acute territorial infarct. No midline shift or extra-axial collection. No intracranial mass effect. No suspicious intracranial vascular hyperdensity. Stable gray white differentiation. Mild for age chronic periventricular white matter changes. Brain volume is stable last year, with minimal limits for age. Small volume of gas in the right cavernous sinus could be posttraumatic or IV access related. There is no other convincing pneumocephalus. VENTRICLES: No hydrocephalus. ORBITS: The orbits are unremarkable. SINUSES AND MASTOIDS: Associated hemorrhage in the paranasal sinuses, especially the right maxillary sinus. Tympanic cavities and mastoids remain well aerated. SOFT TISSUES: No discrete scalp soft tissue injury. Posttraumatic gas in the visible right face. No radiopaque foreign  body is seen. BONES: No acute skull fracture. Multiple acute facial fractures are detailed separately today. CT face and cervical spine today reported separately. Traumatic brain injury risk stratification: Skull fracture: No (low - MBIG 1) Subdural hematoma (SDH): <4 mm (MBIG 1) (Trace anterior convexity subdural blood bilaterally on series 13 images 57 and 59, 2 mm) Subarachnoid hemorrhage [facility]): No Epidural hematoma (EDH): No (low - MBIG 1) Cerebral contusion, intra-axial, intraparenchymal hemorrhage (IPH): No Intraventricular hemorrhage (IVH): No (low - MBIG 1) Midline shift > 1mm or edema/effacement of sulci/vents: No (low - MBIG 1) IMPRESSION: 1. Trace bilateral anterior convexity subdural hematoma (2 mm). No other acute intracranial hemorrhage identified. 2. Multiple acute facial fractures, CT Face reported separately. 3. No other acute intracranial abnormality.  No calvarium fracture identified Electronically signed by: Helayne Hurst MD 02/03/2024 07:55 AM EST RP Workstation: [id number]    Anti-infectives: Anti-infectives (From admission, onward)    Start     Dose/Rate Route Frequency Ordered Stop   02/03/24 0900  clindamycin  (CLEOCIN ) IVPB 900 mg        900 mg 100 mL/hr over 30 Minutes Intravenous  Once 02/03/24 0855 02/03/24 1038        Assessment/Plan GLF TBI/B SDH - Per NSGY, Dr. Lanis, [facility] in 8 hours was improving - SDH no longer visualized, Keppra, neuro checks, TBI therapies.  [name] w/ R epistaxis - Per ENT, Dr. Roark; tentative plan for non-op management but following up today, appreciate their recs. L distal radial fx - Per Hand surgery, Dr. Lorretta, pending. Sternal pain - EKG normal sinus. Tele normal sinus this morning. CXR w/o acute abnormalities. Can hold on CT CAP for now.  R tib/fib pain - xray neg Hx Hypothyroidism - home meds Hx Takotsubo's cardiomyopathy  Hx chronic pain - on Percocet 2.5-5mg  prn at baseline  FEN - NPO till consultant final recs. If  okay for a diet, plan either  FLD or mechanical soft diet pending ENT recs w/ facial fx's. mIVF increased to 75 mL/hr due to NPO status and marginal UOP VTE - SCDs, chem ppx on hold till cleared by NSGY ID - None currently Foley - required I&O, Foley placed 12/12 ~0415 Dispo - Admit to 4NP.  SLP for speech and cog eval. Follow up consultant recs.  If unable to swallow, will need to examine ability to place cortrak given complex facial fractures    LOS: 1 day   I reviewed nursing notes, Consultant ENT notes, hospitalist notes, last 24 h vitals and pain scores, last 48 h intake and output, last 24 h labs and trends, and last 24 h imaging results.  This care required moderate level of medical decision making.   Almarie Pringle, PA-C [facility] Please see Amion for pager number during day hours 7:00am-4:30pm

## 2024-02-04 NOTE — Evaluation (Signed)
 Speech Language Pathology Evaluation Patient Details Name: Charlene Vincent MRN: [id number] DOB: 07/28/1934 Today's Date: 02/04/2024 Time: 9047-8985 SLP Time Calculation (min) (ACUTE ONLY): 22 min  Problem List:  Patient Active Problem List   Diagnosis Date Noted   SDH (subdural hematoma) (HCC) 02/03/2024   Moderate malnutrition 06/13/2023   Urinary tract infection 09/29/2021   Chronic HFrEF (heart failure with reduced ejection fraction) (HCC) 09/29/2021   Hiatal hernia 09/29/2021   Ileus (HCC) 09/28/2021   Open wound of foot excluding toes 01/22/2015   Hyperlipidemia 10/25/2014   Takotsubo cardiomyopathy    Hyponatremia 07/20/2011   Hypothyroidism 07/17/2011   HTN (hypertension) 07/17/2011   Past Medical History:  Past Medical History:  Diagnosis Date   Anemia    Arthritis    Cellulitis    Costochondritis    Headache    Hypothyroidism    Myocardial infarction (HCC) 2012, 2016   both Takotsubo   Pneumonia    Shingles    Takotsubo cardiomyopathy    a. 2012  b. LHC in 09/2014 nl cors, EF 30-35%>>45-50% by echo 11/2014   Tremor, essential    Past Surgical History:  Past Surgical History:  Procedure Laterality Date   ABDOMINAL HYSTERECTOMY     APPLICATION OF A-CELL OF EXTREMITY Left 01/22/2015   Procedure: APPLICATION OF A-CELL TO LEFT THIGH ;  Surgeon: Earlis Ranks, MD;  Location: [facility] OR;  Service: Plastics;  Laterality: Left;   APPLICATION OF WOUND VAC Left 10/09/2014   Procedure: APPLICATION OF WOUND VAC;  Surgeon: Glendia Cordella Hutchinson, MD;  Location: [facility] OR;  Service: Orthopedics;  Laterality: Left;   CARDIAC CATHETERIZATION  September 10, 2010   EF 35 to 40%. No significant CAD. ? takotsubo cardiomyopathy   CARDIAC CATHETERIZATION N/A 10/02/2014   Procedure: Left Heart Cath and Coronary Angiography;  Surgeon: Peter M Jordan, MD;  Location: [facility] INVASIVE CV LAB;  Service: Cardiovascular;  Laterality: N/A;   I & D EXTREMITY Left 10/09/2014   Procedure: DEBRIDEMENT LEFT FOOT;   Surgeon: Glendia Cordella Hutchinson, MD;  Location: [facility] OR;  Service: Orthopedics;  Laterality: Left;   INGUINAL HERNIA REPAIR     SKIN FULL THICKNESS GRAFT Left 10/12/2014   Procedure: Debridement left foot, application integra, wound vacuum-assisted closure;  Surgeon: Earlis Ranks, MD;  Location: [facility] OR;  Service: Plastics;  Laterality: Left;   SKIN GRAFT Left 01/22/2015   left thigh to left foot   SKIN SPLIT GRAFT Left 01/22/2015   Procedure:  SPLIT THICKNESS SKIN GRAFT FROM LEFT THIGH TO LEFT FOOT ;  Surgeon: Earlis Ranks, MD;  Location: [facility] OR;  Service: Plastics;  Laterality: Left;   HPI:  Charlene Vincent is an 89 yo presenting 12/11 s/p GLF at home. Work up revealed TBI/B SDH, [place] II fx with R epistaxis, and L distal radial fx. Previous clinical swallow eval in 2023 was [facility]. PMH includes: hypothyroidism, Takotsubo CM, prior MI, PNA   Assessment / Plan / Recommendation Clinical Impression  Pt presents with a decline in cognition and communication after GLF, for which she would benefit from ongoing SLP services. Hopeful for improvements in speech as she heals from her injuries, also noting mild improvement with her oral cavity more clear.   Pt is very sleepy this morning but wakes up well while stimulation is being provided. Her sustained attention and awareness are reduced. She is oriented to person only. Her son at bedside, with whom she lives, shares that she would typically be more oriented than this. He  shares that he helps with all iADLs at baseline, but adds that she often remembers things well (her memory may come and go though). Memory is likely further reduced today as she is not retaining basic information about her current situation. Her speech is also dysarthric, noting that she typically wears her dentures at home during waking hours, but suspecting greater impact is coming from traumatic injuries. Her upper lip is very edematous, and she does not have much ROM of the R side of her  mouth.      SLP Assessment  SLP Recommendation/Assessment: Patient needs continued Speech Language Pathology Services SLP Visit Diagnosis: Cognitive communication deficit (R41.841)     Assistance Recommended at Discharge  Frequent or constant Supervision/Assistance  Functional Status Assessment Patient has had a recent decline in their functional status and demonstrates the ability to make significant improvements in function in a reasonable and predictable amount of time.  Frequency and Duration min 2x/week  2 weeks      SLP Evaluation Cognition  Overall Cognitive Status: Impaired/Different from baseline Arousal/Alertness: Lethargic Orientation Level: Oriented to person;Disoriented to time;Disoriented to place;Disoriented to situation Attention: Sustained Sustained Attention: Impaired Sustained Attention Impairment: Verbal basic;Verbal complex Memory: Impaired Memory Impairment: Decreased recall of new information Awareness: Impaired Awareness Impairment: Intellectual impairment;Other (comment) (online)       Comprehension  Auditory Comprehension Overall Auditory Comprehension: Other (comment) (seems to follow most commands when able to hear prompt)    Expression Expression Primary Mode of Expression: Verbal Verbal Expression Overall Verbal Expression: Appears within functional limits for tasks assessed   Oral / Motor  Oral Motor/Sensory Function Overall Oral Motor/Sensory Function: Moderate impairment Facial ROM: Reduced right Facial Symmetry: Abnormal symmetry right Lingual ROM: Within Functional Limits Lingual Symmetry: Within Functional Limits Motor Speech Overall Motor Speech: Impaired Articulation: Impaired Level of Impairment: Phrase Intelligibility: Intelligibility reduced Phrase: 75-100% accurate            Leita SAILOR., M.A. CCC-SLP Acute Rehabilitation Services Office: [phone]  Secure chat preferred  02/04/2024, 11:25 AM

## 2024-02-04 NOTE — Evaluation (Signed)
 Physical Therapy Evaluation Patient Details Name: Charlene Vincent MRN: [id number] DOB: September 04, 1934 Today's Date: 02/04/2024  History of Present Illness  Pt is 89 yo presenting to [facility] On 12/11 due to a fall. Pt hit her face with multiple facial fractures present.  PMH significant for frailty of advanced age, ischemic cardiomyopathy, moderate malnutrition, chronic hyponatremia.  Clinical Impression  Pt is presenting as 2 person mod A for bed mobility, 2 person Min A for sit to stand at EOB. Maintained NWB through LUE throughout session due to radial fracture. Unclear level of assist pt has at home. Currently pt is very high fall risk. Due to pt current functional status, home set up and available assistance at home recommending skilled physical therapy services < 3 hours/day in order to address strength, balance and functional mobility to decrease risk for falls, injury, immobility, skin break down and re-hospitalization.          If plan is discharge home, recommend the following: A little help with walking and/or transfers;Help with stairs or ramp for entrance;Assistance with cooking/housework;Assist for transportation   Can travel by private vehicle   No    Equipment Recommendations BSC/3in1;Rolling walker (2 wheels)     Functional Status Assessment Patient has had a recent decline in their functional status and demonstrates the ability to make significant improvements in function in a reasonable and predictable amount of time.     Precautions / Restrictions Precautions Precautions: Fall Recall of Precautions/Restrictions: Impaired Restrictions Weight Bearing Restrictions Per Provider Order:  (no orders placed, NWB on LUE kept throughout session)      Mobility  Bed Mobility Overal bed mobility: Needs Assistance Bed Mobility: Supine to Sit, Sit to Supine     Supine to sit: Mod assist, +2 for physical assistance, +2 for safety/equipment Sit to supine: Mod assist, +2 for  physical assistance, +2 for safety/equipment   General bed mobility comments: Mod A to mid line    Transfers Overall transfer level: Needs assistance Equipment used: 2 person hand held assist Transfers: Sit to/from Stand Sit to Stand: Min assist, +2 physical assistance, +2 safety/equipment           General transfer comment: Min A +2 for sit to stand from EOB with one person provided support at L elbow and second person at RUE. Pt took side steps with Min a for wgt shifting; unsteady, posterior lean throughout.    Ambulation/Gait   Pre-gait activities: Pt took side steps with Min a for wgt shifting; unsteady, posterior lean throughout. 2 person HHA.      Balance Overall balance assessment: Needs assistance Sitting-balance support: Bilateral upper extremity supported, Feet unsupported Sitting balance-Leahy Scale: Fair   Postural control: Posterior lean Standing balance support: Bilateral upper extremity supported, During functional activity Standing balance-Leahy Scale: Poor Standing balance comment: reliant on external support.         Pertinent Vitals/Pain Pain Assessment Pain Assessment: Faces Faces Pain Scale: Hurts a little bit Pain Location: back Pain Descriptors / Indicators: Discomfort Pain Intervention(s): Limited activity within patient's tolerance, Monitored during session    Home Living Family/patient expects to be discharged to:: Private residence Living Arrangements: Children;Non-relatives/Friends (son) Available Help at Discharge: Family;Friend(s);Available 24 hours/day Type of Home: Mobile home         Home Layout: One level Home Equipment: Rolling Walker (2 wheels) Additional Comments: information taken from previous chart this year due to pt is poor historian.    Prior Function Prior Level of Function : Independent/Modified Independent;Needs  assist;Patient poor historian/Family not available  Cognitive Assist : ADLs (cognitive)      Physical Assist : ADLs (physical)     Mobility Comments: ambulates with RW ADLs Comments: assisted by family     Extremity/Trunk Assessment   Upper Extremity Assessment Upper Extremity Assessment: Defer to OT evaluation    Lower Extremity Assessment Lower Extremity Assessment: Generalized weakness    Cervical / Trunk Assessment Cervical / Trunk Assessment: Kyphotic  Communication   Communication Communication: Impaired Factors Affecting Communication: Hearing impaired;Difficulty expressing self    Cognition Arousal: Alert, Lethargic Behavior During Therapy: WFL for tasks assessed/performed, Flat affect   PT - Cognitive impairments: No family/caregiver present to determine baseline, Orientation, Safety/Judgement, Problem solving, Initiation, Sequencing, Attention, Memory, Awareness   Orientation impairments: Place, Time, Situation       Following commands: Impaired Following commands impaired: Follows one step commands with increased time     Cueing Cueing Techniques: Verbal cues, Gestural cues, Tactile cues     General Comments General comments (skin integrity, edema, etc.): vital signs stable on room air. BP 137/50 during activity        Assessment/Plan    PT Assessment Patient needs continued PT services  PT Problem List Decreased strength;Decreased balance;Decreased mobility;Decreased activity tolerance;Decreased safety awareness       PT Treatment Interventions DME instruction;Therapeutic activities;Therapeutic exercise;Gait training;Stair training;Balance training;Functional mobility training;Neuromuscular re-education;Patient/family education    PT Goals (Current goals can be found in the Care Plan section)  Acute Rehab PT Goals PT Goal Formulation: Patient unable to participate in goal setting Time For Goal Achievement: 02/18/24 Potential to Achieve Goals: Fair    Frequency Min 2X/week     Co-evaluation PT/OT/SLP Co-Evaluation/Treatment:  Yes Reason for Co-Treatment: Complexity of the patient's impairments (multi-system involvement);For patient/therapist safety;To address functional/ADL transfers PT goals addressed during session: Mobility/safety with mobility;Balance         AM-PAC PT 6 Clicks Mobility  Outcome Measure Help needed turning from your back to your side while in a flat bed without using bedrails?: Total Help needed moving from lying on your back to sitting on the side of a flat bed without using bedrails?: Total Help needed moving to and from a bed to a chair (including a wheelchair)?: Total Help needed standing up from a chair using your arms (e.g., wheelchair or bedside chair)?: Total Help needed to walk in hospital room?: Total Help needed climbing 3-5 steps with a railing? : Total 6 Click Score: 6    End of Session Equipment Utilized During Treatment: Gait belt Activity Tolerance: Patient tolerated treatment well Patient left: in bed;with call bell/phone within reach Nurse Communication: Mobility status PT Visit Diagnosis: Unsteadiness on feet (R26.81);Other abnormalities of gait and mobility (R26.89);History of falling (Z91.81);Muscle weakness (generalized) (M62.81)    Time: 8956-8893 PT Time Calculation (min) (ACUTE ONLY): 23 min   Charges:   PT Evaluation $PT Eval Low Complexity: 1 Low   PT General Charges $$ ACUTE PT VISIT: 1 Visit         Dorothyann Maier, DPT, CLT  Acute Rehabilitation Services Office: [phone] (Secure chat preferred)   Dorothyann VEAR Maier 02/04/2024, 4:45 PM

## 2024-02-04 NOTE — ED Notes (Signed)
 Primary RN entered room to answer family questions after speaking to [employer], no family member present in room,

## 2024-02-04 NOTE — ED Notes (Signed)
 Pt attempting to get out of bed and wanting to go to the bathroom. Pt unable to void, bladder scanned 

## 2024-02-04 NOTE — Evaluation (Signed)
 Occupational Therapy Evaluation Patient Details Name: Charlene Vincent MRN: [id number] DOB: [birth year] Today's Date: 02/04/2024   History of Present Illness   Pt is 89 yo presenting to [facility] On 12/11 due to a fall. Pt hit her face with multiple facial fractures present.  PMH significant for frailty of advanced age, ischemic cardiomyopathy, moderate malnutrition, chronic hyponatremia.     Clinical Impressions Pt is a poor historian, unclear of her PLOF and no family available to clarify. Presents with generalized weakness, impaired cognition and decreased standing balance. She needs mod assist for bed mobility and +2 min assist to stand and +2 mod assist to side step toward HOB. Pt is dependent in all ADLs. Patient will benefit from continued inpatient follow up therapy, <3 hours/day.     If plan is discharge home, recommend the following:   A lot of help with walking and/or transfers;A lot of help with bathing/dressing/bathroom;Assistance with cooking/housework;Assistance with feeding;Direct supervision/assist for medications management;Direct supervision/assist for financial management;Assist for transportation;Help with stairs or ramp for entrance     Functional Status Assessment   Patient has had a recent decline in their functional status and/or demonstrates limited ability to make significant improvements in function in a reasonable and predictable amount of time     Equipment Recommendations   Other (comment) (defer)     Recommendations for Other Services         Precautions/Restrictions   Precautions Precautions: Fall Recall of Precautions/Restrictions: Impaired Required Braces or Orthoses: Sling Restrictions Weight Bearing Restrictions Per Provider Order:  (assume NWB on R UE, no specific order)     Mobility Bed Mobility Overal bed mobility: Needs Assistance Bed Mobility: Supine to Sit, Sit to Supine     Supine to sit: Mod assist, +2 for physical  assistance, +2 for safety/equipment Sit to supine: Mod assist, +2 for physical assistance, +2 for safety/equipment   General bed mobility comments: Mod A to mid line    Transfers Overall transfer level: Needs assistance Equipment used: 2 person hand held assist Transfers: Sit to/from Stand Sit to Stand: Min assist, +2 physical assistance, +2 safety/equipment                  Balance Overall balance assessment: Needs assistance   Sitting balance-Leahy Scale: Fair       Standing balance-Leahy Scale: Poor                             ADL either performed or assessed with clinical judgement   ADL                                         General ADL Comments: Pt is NPO, total assist for all ADLs.     Vision   Additional Comments: pt unable to state if she wears glasses, increased facial swelling, did not formally assess, appears grossly intact     Perception Perception: Not tested       Praxis Praxis: Not tested       Pertinent Vitals/Pain Pain Assessment Pain Assessment: Faces Faces Pain Scale: Hurts a little bit Pain Location: back Pain Descriptors / Indicators: Discomfort Pain Intervention(s): Monitored during session, Repositioned     Extremity/Trunk Assessment Upper Extremity Assessment Upper Extremity Assessment: LUE deficits/detail;Generalized weakness LUE Deficits / Details: L UE splinted from PIPs proximal of elbow LUE: Unable to  fully assess due to immobilization LUE Coordination: decreased fine motor;decreased gross motor   Lower Extremity Assessment Lower Extremity Assessment: Defer to PT evaluation   Cervical / Trunk Assessment Cervical / Trunk Assessment: Kyphotic   Communication Communication Communication: Impaired Factors Affecting Communication: Hearing impaired;Difficulty expressing self   Cognition Arousal: Alert, Lethargic Behavior During Therapy: Flat affect Cognition: No family/caregiver present  to determine baseline                               Following commands: Impaired Following commands impaired: Follows one step commands with increased time     Cueing  General Comments   Cueing Techniques: Verbal cues;Gestural cues;Tactile cues     Exercises     Shoulder Instructions      Home Living Family/patient expects to be discharged to:: Private residence Living Arrangements: Children;Non-relatives/Friends Available Help at Discharge: Family;Friend(s);Available 24 hours/day Type of Home: Mobile home       Home Layout: One level     Bathroom Shower/Tub: Chief Strategy Officer: Standard     Home Equipment: Agricultural Consultant (2 wheels)   Additional Comments: information taken from previous chart this year due to pt is poor historian.  Lives With: Son;Friend(s)    Prior Functioning/Environment Prior Level of Function : Patient poor historian/Family not available  Cognitive Assist : ADLs (cognitive)     Physical Assist : ADLs (physical)     Mobility Comments: ambulates with RW ADLs Comments: assisted by family    OT Problem List: Decreased strength;Impaired balance (sitting and/or standing);Decreased coordination;Decreased cognition;Decreased safety awareness;Decreased knowledge of use of DME or AE;Decreased knowledge of precautions;Impaired UE functional use;Pain   OT Treatment/Interventions: Self-care/ADL training;DME and/or AE instruction;Therapeutic activities;Patient/family education;Balance training;Cognitive remediation/compensation      OT Goals(Current goals can be found in the care plan section)   Acute Rehab OT Goals OT Goal Formulation: Patient unable to participate in goal setting Time For Goal Achievement: 02/18/24 Potential to Achieve Goals: Fair ADL Goals Pt Will Perform Eating: with supervision;sitting Pt Will Perform Grooming: with min assist;sitting Pt Will Transfer to Toilet: with min  assist;ambulating;bedside commode Additional ADL Goal #1: Pt will complete bed mobility with min assist in preparation for ADLs.   OT Frequency:  Min 2X/week    Co-evaluation   Reason for Co-Treatment: Complexity of the patient's impairments (multi-system involvement);For patient/therapist safety;To address functional/ADL transfers PT goals addressed during session: Mobility/safety with mobility;Balance        AM-PAC OT 6 Clicks Daily Activity     Outcome Measure Help from another person eating meals?: Total Help from another person taking care of personal grooming?: Total Help from another person toileting, which includes using toliet, bedpan, or urinal?: Total Help from another person bathing (including washing, rinsing, drying)?: Total Help from another person to put on and taking off regular upper body clothing?: Total Help from another person to put on and taking off regular lower body clothing?: Total 6 Click Score: 6   End of Session Equipment Utilized During Treatment: Gait belt  Activity Tolerance: Patient tolerated treatment well Patient left: in bed;with call bell/phone within reach;with bed alarm set  OT Visit Diagnosis: Unsteadiness on feet (R26.81);Pain;Other symptoms and signs involving cognitive function;History of falling (Z91.81);Muscle weakness (generalized) (M62.81)                Time: 1053-1110 OT Time Calculation (min): 17 min Charges:  OT General Charges $OT Visit: 1 Visit  OT Evaluation $OT Eval Moderate Complexity: 1 Mod  Mliss HERO, OTR/L Acute Rehabilitation Services Office: [phone]   Kennth Mliss Helling 02/04/2024, 4:50 PM

## 2024-02-05 DIAGNOSIS — S02412A LeFort II fracture, initial encounter for closed fracture: Secondary | ICD-10-CM | POA: Diagnosis not present

## 2024-02-05 DIAGNOSIS — W19XXXA Unspecified fall, initial encounter: Secondary | ICD-10-CM | POA: Diagnosis not present

## 2024-02-05 LAB — BASIC METABOLIC PANEL WITH GFR
Anion gap: 7 (ref 5–15)
BUN: 17 mg/dL (ref 8–23)
CO2: 22 mmol/L (ref 22–32)
Calcium: 8.2 mg/dL — ABNORMAL LOW (ref 8.9–10.3)
Chloride: 105 mmol/L (ref 98–111)
Creatinine, Ser: 0.69 mg/dL (ref 0.44–1.00)
GFR, Estimated: >60 mL/min (ref >60)
Glucose, Bld: 145 mg/dL — ABNORMAL HIGH (ref 70–99)
Potassium: 3.6 mmol/L (ref 3.5–5.1)
Sodium: 134 mmol/L — ABNORMAL LOW (ref 135–145)

## 2024-02-05 LAB — CBC
HCT: 33.8 % — ABNORMAL LOW (ref 36.0–46.0)
Hemoglobin: 11.4 g/dL — ABNORMAL LOW (ref 12.0–15.0)
MCH: 30.9 pg (ref 26.0–34.0)
MCHC: 33.7 g/dL (ref 30.0–36.0)
MCV: 91.6 fL (ref 80.0–100.0)
Platelets: 149 K/uL — ABNORMAL LOW (ref 150–400)
RBC: 3.69 MIL/uL — ABNORMAL LOW (ref 3.87–5.11)
RDW: 13.9 % (ref 11.5–15.5)
WBC: 9.1 K/uL (ref 4.0–10.5)
nRBC: 0 % (ref 0.0–0.2)

## 2024-02-05 MED ORDER — LEVETIRACETAM 100 MG/ML PO SOLN
500.0000 mg | Freq: Two times a day (BID) | ORAL | Status: AC
  Administered 2024-02-05 – 2024-02-09 (×9): 500 mg via ORAL
  Filled 2024-02-05 (×9): qty 5

## 2024-02-05 MED ORDER — ENSURE PLUS HIGH PROTEIN PO LIQD
237.0000 mL | Freq: Two times a day (BID) | ORAL | Status: DC
  Administered 2024-02-05 – 2024-02-11 (×13): 237 mL via ORAL

## 2024-02-05 NOTE — Plan of Care (Signed)

## 2024-02-05 NOTE — Progress Notes (Signed)
 Progress Note     Subjective: Patient alert. Food tray in front of pt.   Oriented to person only. Follows commands.   ROS  All negative with the exception of above.  Objective: Vital signs in last 24 hours: Temp:  [97.6 F (36.4 C)-98.6 F (37 C)] 98.3 F (36.8 C) (12/13 1114) Pulse Rate:  [54-83] 74 (12/13 1114) Resp:  [11-18] 14 (12/13 1114) BP: (108-148)/(46-62) 126/59 (12/13 1114) SpO2:  [91 %-99 %] 96 % (12/13 1114) Last BM Date :  (pta)  Intake/Output from previous day: 12/12 0701 - 12/13 0700 In: -  Out: 645 [Urine:645] Intake/Output this shift: No intake/output data recorded.  PE: Gen:  Alert, NAD, pleasant and cooperative. HEENT: Periorbital and facial ecchymosis with swelling of the midface and jaw area. No large expanding hematoma. Some ecchymosis envolving neck. Trachea is midline. Dried blood R>L nares and mouth. No active bleeding. Card:  Normal rate during encounter. Pedal pulses 2+ BL, no lower extremity edema, chronic skin changes of lower extremities. Pulm:  Nonlabored on room air. Abd: Soft, non-tender, non-distendedd GU: foley in place draining clear, yellow urine Skin: warm and dry, no rashes  MS: Splint of left upper extremity Psych: A&Ox1  Neuro: non-focal exam, appropriate speech, sometimes difficult to understand due to facial fractures and no teeth.   Lab Results:  Recent Labs    02/03/24 0853 02/04/24 0316  WBC 12.0* 11.1*  HGB 12.9 11.4*  HCT 39.5 34.8*  PLT 157 133*   BMET Recent Labs    02/03/24 0853 02/04/24 0316  NA 129* 130*  K 4.0 4.2  CL 93* 99  CO2 26 24  GLUCOSE 129* 109*  BUN 10 16  CREATININE 0.59 0.67  CALCIUM  9.0 8.1*   PT/INR No results for input(s): LABPROT, INR in the last 72 hours. CMP     Component Value Date/Time   NA 130 (L) 02/04/2024 0316   NA 143 12/28/2017 1538   K 4.2 02/04/2024 0316   CL 99 02/04/2024 0316   CO2 24 02/04/2024 0316   GLUCOSE 109 (H) 02/04/2024 0316   BUN 16  02/04/2024 0316   BUN 11 12/28/2017 1538   CREATININE 0.67 02/04/2024 0316   CREATININE 0.60 03/14/2015 1514   CALCIUM  8.1 (L) 02/04/2024 0316   PROT 6.4 (L) 06/12/2023 0848   ALBUMIN 3.4 (L) 06/12/2023 0848   AST 30 06/12/2023 0848   ALT 25 06/12/2023 0848   ALKPHOS 74 06/12/2023 0848   BILITOT 0.6 06/12/2023 0848   GFRNONAA >60 02/04/2024 0316   GFRAA 85 12/28/2017 1538   Lipase     Component Value Date/Time   LIPASE 59 (H) 06/12/2023 0848       Studies/Results: CT CHEST ABDOMEN PELVIS W CONTRAST Result Date: 02/04/2024 EXAM: CT CHEST, ABDOMEN AND PELVIS WITH CONTRAST 02/04/2024 07:10:00 PM TECHNIQUE: CT of the chest, abdomen and pelvis was performed with the administration of 75 mL iohexol  (OMNIPAQUE ) 350 MG/ML injection. Multiplanar reformatted images are provided for review. Automated exposure control, iterative reconstruction, and/or weight based adjustment of the mA/kV was utilized to reduce the radiation dose to as low as reasonably achievable. COMPARISON: 06/12/2023 CLINICAL HISTORY: Polytrauma, blunt. FINDINGS: CHEST: MEDIASTINUM AND LYMPH NODES: Heart and pericardium: Calcific aortic sclerosis. The central airways are clear. No mediastinal, hilar or axillary lymphadenopathy. LUNGS AND PLEURA: Emphysema, bibasilar atelectasis. No pleural effusion or pneumothorax. ABDOMEN AND PELVIS: LIVER: The liver is unremarkable. GALLBLADDER AND BILE DUCTS: Gallbladder is unremarkable. No biliary ductal dilatation. SPLEEN: No  acute abnormality. PANCREAS: No acute abnormality. ADRENAL GLANDS: No acute abnormality. KIDNEYS, URETERS AND BLADDER: No stones in the kidneys or ureters. No hydronephrosis. No perinephric or periureteral stranding. Urinary bladder decompressed by Foley catheter. GI AND BOWEL: Intermediate-sized hiatal hernia. Stomach demonstrates no acute abnormality. There is no bowel obstruction. REPRODUCTIVE ORGANS: No acute abnormality. PERITONEUM AND RETROPERITONEUM: No ascites. No  free air. VASCULATURE: Aorta is normal in caliber. ABDOMINAL AND PELVIS LYMPH NODES: No lymphadenopathy. BONES AND SOFT TISSUES: Osseous: Grade 1 anterolisthesis of L4-L5 and L5-S1. Lumbar levoscoliosis with apex at L2, unchanged. Chronic L2 compression fracture. No focal soft tissue abnormality. IMPRESSION: 1. No acute traumatic abnormality of the chest, abdomen, or pelvis. 2. Emphysema Electronically signed by: Franky Stanford MD 02/04/2024 07:43 PM EST RP Workstation: [id number]   CT ANGIO NECK W OR WO CONTRAST Result Date: 02/04/2024 EXAM: CTA Neck 02/04/2024 07:10:00 PM TECHNIQUE: CT of the neck was performed without and with the administration of intravenous contrast. 75 mL (iohexol  (OMNIPAQUE ) 350 MG/ML injection 75 mL IOHEXOL  350 MG/ML SOLN) was administered. Multiplanar 2D and/or 3D reformatted images are provided for review. Automated exposure control, iterative reconstruction, and/or weight based adjustment of the mA/kV was utilized to reduce the radiation dose to as low as reasonably achievable. Stenosis of the internal carotid arteries measured using NASCET criteria. COMPARISON: CT head, maxillofacial, and cervical spine 02/03/2024. CLINICAL HISTORY: Neck trauma, arterial injury suspected. FINDINGS: AORTIC ARCH AND ARCH VESSELS: Small to moderate amount of calcified plaque in the aortic arch. No significant stenosis of the arch vessel origins. No dissection or arterial injury. CERVICAL CAROTID ARTERIES: Small amount of mixed plaque at the left carotid bifurcation. Mild stenosis of the origin of the left external carotid artery. No evidence of a dissection or significant stenosis of the common carotid or cervical internal carotid arteries. No evidence of arterial injury. CERVICAL VERTEBRAL ARTERIES: The vertebral arteries are patent with the left being dominant. Motion artifact and suboptimal arterial opacification limit assessment of the V3 and V4 segments. There is no evidence of a significant  stenosis or dissection more proximally. LUNGS AND MEDIASTINUM: Moderate centrilobular emphysema. SOFT TISSUES: Partially visualized facial fractures and right greater than left maxillary sinus fluid/hemorrhage, more fully evaluated on yesterday's maxillofacial CT. Right sided facial soft tissue swelling/hematoma. BONES: Grade 2 anterolisthesis of C4 on C5. Moderate multilevel cervical facet arthrosis. Advanced disc degeneration at C5-C6, C6-C7, and T2-T3. Partially visualized extensive arthropathy involving the right greater than left shoulders. IMPRESSION: 1. No evidence of acute arterial injury in the neck, with note made of suboptimal assessment of the distal vertebral arteries due to motion and contrast timing. 2. Mild atherosclerosis without a significant stenosis of the common carotid or cervical internal carotid arteries. 3. Partially visualized known facial fractures. Electronically signed by: Dasie Hamburg MD 02/04/2024 07:35 PM EST RP Workstation: [id number]   CT Head Wo Contrast Result Date: 02/03/2024 CLINICAL DATA:  Subdural hematoma EXAM: CT HEAD WITHOUT CONTRAST TECHNIQUE: Contiguous axial images were obtained from the base of the skull through the vertex without intravenous contrast. RADIATION DOSE REDUCTION: This exam was performed according to the departmental dose-optimization program which includes automated exposure control, adjustment of the mA and/or kV according to patient size and/or use of iterative reconstruction technique. COMPARISON:  02/03/2024 FINDINGS: Brain: No evidence of acute infarct or hemorrhage. The trace subdural hematoma along the anterior convexity noted on prior study is no longer visualized. No new areas of hemorrhage are identified. The lateral ventricles and midline structures are stable. No  mass effect. Vascular: No hyperdense vessel or unexpected calcification. Skull: Normal. Negative for fracture or focal lesion. Sinuses/Orbits: Please refer to prior facial CT  report describing extensive bilateral facial bone fractures, most pronounced within the right maxillary sinus. Blood products opacify the right maxillary sinus and layer dependently within the left maxillary sinus. Other: None. IMPRESSION: 1. The trace bilateral subdural hematoma along the anterior convexities noted on prior exam is no longer visualized on this study. 2. No acute intracranial hemorrhage.  No acute infarct. 3. Please refer to prior CT facial bone exam describing extensive bilateral facial bone fractures. Electronically Signed   By: Ozell Daring M.D.   On: 02/03/2024 17:00   DG Tibia/Fibula Right Port Result Date: 02/03/2024 CLINICAL DATA:  Fall with right lower leg pain. EXAM: PORTABLE RIGHT TIBIA AND FIBULA - 2 VIEW COMPARISON:  None Available. FINDINGS: Mild diffuse decreased bone mineralization. No acute fracture or dislocation. Soft tissues unremarkable. IMPRESSION: No acute findings. Electronically Signed   By: Toribio Agreste M.D.   On: 02/03/2024 12:38    Anti-infectives: Anti-infectives (From admission, onward)    Start     Dose/Rate Route Frequency Ordered Stop   02/03/24 0900  clindamycin  (CLEOCIN ) IVPB 900 mg        900 mg 100 mL/hr over 30 Minutes Intravenous  Once 02/03/24 0855 02/03/24 1038        Assessment/Plan GLF TBI/B SDH - Per NSGY, Dr. Lanis, [facility] in 8 hours was improving - SDH no longer visualized, Keppra , neuro checks, TBI therapies.  [name] w/ R epistaxis - Per ENT, Dr. Roark; tentative plan for non-op management, appreciate their recs. L distal radial fx - Per Hand surgery, Dr. Lorretta, pending. Sternal pain - EKG normal sinus. Tele normal sinus this morning. CXR w/o acute abnormalities. R tib/fib pain - xray neg Hx Hypothyroidism - home meds Hx Takotsubo's cardiomyopathy  Hx chronic pain - on Percocet 2.5-5mg  prn at baseline   FEN - Dys 1 ENT recs. VTE - SCDs, chem ppx on hold till cleared by NSGY ID - None currently Foley -  required I&O, Foley placed 12/12 ~0415 Dispo - Admit to 4NP.   OT - Skilled nursing recs PT - Skilled nursing recs   LOS: 2 days   I reviewed specialist notes,  nursing notes, last 24 h vitals and pain scores, last 48 h intake and output, last 24 h labs and trends, and last 24 h imaging results.  This care required moderate level of medical decision making.    Marjorie Carlyon Favre, [facility] 02/05/2024, 12:06 PM Please see Amion for pager number during day hours 7:00am-4:30pm

## 2024-02-05 NOTE — TOC Initial Note (Signed)
 Transition of Care [facility]) - Initial/Assessment Note    Patient Details  Name: Charlene Vincent MRN: [id number] Date of Birth: Apr 16, 1934  Transition of Care [facility]) CM/SW Contact:    Charlene Vincent, LCSWA Phone Number: 02/05/2024, 10:18 AM  Clinical Narrative:                  CSW spoke with pt's son Charlene Vincent by phone, discussed PT recs for SNF, he states he is not in agreement with this recommendation at this time and would rather take pt home as he cares for her 24 hours a day, in addition there is another friend that lives in the home to provide assistance. Charlene Vincent expressed interest in [facility] PT options, will make RNCM aware.        Patient Goals and CMS Choice            Expected Discharge Plan and Services                                              Prior Living Arrangements/Services                       Activities of Daily Living      Permission Sought/Granted                  Emotional Assessment              Admission diagnosis:  Subdural hematoma (HCC) [S06.5XAA] SDH (subdural hematoma) (HCC) [S06.5XAA] Closed Charlene Vincent fracture, initial encounter [facility]) [S02.412A] Fall, initial encounter [W19.XXXA] Laceration of upper lip with complication, initial encounter [S01.511A] Epistaxis due to trauma [R04.0] Patient Active Problem List   Diagnosis Date Noted   SDH (subdural hematoma) (HCC) 02/03/2024   Moderate malnutrition 06/13/2023   Urinary tract infection 09/29/2021   Chronic HFrEF (heart failure with reduced ejection fraction) (HCC) 09/29/2021   Hiatal hernia 09/29/2021   Ileus (HCC) 09/28/2021   Open wound of foot excluding toes 01/22/2015   Hyperlipidemia 10/25/2014   Takotsubo cardiomyopathy    Hyponatremia 07/20/2011   Hypothyroidism 07/17/2011   HTN (hypertension) 07/17/2011   PCP:  Charlene Josette ORN., PA-C Pharmacy:   [facility] [phone] - [place], [place] - [address] [place] [address] Phone: [phone] Fax: [phone]  [facility] [address] [place] [address] Phone: [phone] Fax: [phone]     Social Drivers of Health (SDOH) Social History: SDOH Screenings   Food Insecurity: Patient Unable To Answer (02/04/2024)  Housing: Unknown (02/04/2024)  Transportation Needs: Patient Unable To Answer (02/04/2024)  Utilities: Patient Unable To Answer (02/04/2024)  Social Connections: Patient Unable To Answer (02/04/2024)  Tobacco Use: Medium Risk (01/27/2024)   Received from [facility]   SDOH Interventions:     Readmission Risk Interventions     No data to display

## 2024-02-05 NOTE — TOC Progression Note (Signed)
 Transition of Care [facility]) - Progression Note    Patient Details  Name: KENISHA LYNDS MRN: [id number] Date of Birth: 1934/07/22  [facility]) CM/SW Contact  Robynn Eileen Hoose, RN Phone Number: 02/05/2024, 10:41 AM  Clinical Narrative:   Spoke with patient son, [name], confirmed that he wants patient to be discharged home with home health services. Son does not have a preference on which home health agency to use. Referral sent to [facility] though [facility] Epic portal, awaiting response. Will need home health orders with face to face.                      Expected Discharge Plan and Services                                               Social Drivers of Health (SDOH) Interventions SDOH Screenings   Food Insecurity: Patient Unable To Answer (02/04/2024)  Housing: Unknown (02/04/2024)  Transportation Needs: Patient Unable To Answer (02/04/2024)  Utilities: Patient Unable To Answer (02/04/2024)  Social Connections: Patient Unable To Answer (02/04/2024)  Tobacco Use: Medium Risk (01/27/2024)   Received from [facility]    Readmission Risk Interventions     No data to display

## 2024-02-05 NOTE — Progress Notes (Signed)
 Speech Language Pathology Treatment: Dysphagia  Patient Details Name: RASEEL JANS MRN: [id number] DOB: 06/02/1934 Today's Date: 02/05/2024 Time: 1249-1300 SLP Time Calculation (min) (ACUTE ONLY): 11 min  Assessment / Plan / Recommendation Clinical Impression  Rec: Continue with Dys 1 (puree), nectar thick liquids. SLP will follow for advancement.  Patient seen by SLP for skilled treatment focused on dysphagia goals. She was awake, alert and cooperative but required encouragement for PO intake. Lunch meal tray on table in front of her and per RN, patient was able to feed herself but doesn't seem very interested in PO's. RN denied observing patient to have had any difficulties with swallowing. SLP assessed her swallow with nectar thick liquids and puree solids. Mildly delayed oral phase and patient observed to swallow an extra time with each sip of liquids and bite of puree. No overt s/s aspiration observed.    HPI HPI: Ms. Solomon is an 89 yo presenting 12/11 s/p GLF at home. Work up revealed TBI/B SDH, [place] II fx with R epistaxis, and L distal radial fx. Previous clinical swallow eval in 2023 was [facility]. BSE completed 02/04/24 recommending initiate PO diet of dys 1 (puree) solids, nectar thick liquids. PMH includes: hypothyroidism, Takotsubo CM, prior MI, PNA      SLP Plan  Continue with current plan of care        Swallow Evaluation Recommendations         Recommendations  Diet recommendations: Dysphagia 1 (puree);Nectar-thick liquid Medication Administration: Crushed with puree Supervision: Full supervision/cueing for compensatory strategies Compensations: Slow rate;Small sips/bites Postural Changes and/or Swallow Maneuvers: Seated upright 90 degrees                      Frequent or constant Supervision/Assistance Dysphagia, unspecified (R13.10)     Continue with current plan of care    Norleen IVAR Blase, MA, CCC-SLP Speech Therapy   02/05/2024, 1:07  PM

## 2024-02-05 NOTE — Progress Notes (Signed)
 Patient frequently asks to use the restroom for BM, placed on the bedpan multiple times with no output. Son reports that at home she goes back and forth to the restroom approximately every 10-15 minutes but is unable to confirm if she voids or stools each time.

## 2024-02-05 NOTE — Progress Notes (Signed)
 Patient ID: Charlene Vincent, female   DOB: 19-Feb-1935, 89 y.o.   MRN: [id number]  She is doing better.  A lot of the swelling has decreased and the hematoma is flowing down her neck from her face.  She seems to be taking some p.o. soft diet.  Examination the mouth seems to close well.  Still has a lot of swelling of the upper lip.  I tried to move the hard palate with palpation and it was uncomfortable but it did not seem to move loosely.  I could not actually tell if there is any movement at all and it.  Given her age and medical condition it would not seem worth the risk to proceed with a open reduction of this Ladora Appl fracture as left with a soft liquid diet it should heal especially since she has no occlusion issues with being edentulous.  The patient agrees with this.  I think she can follow-up in about 1 to 2 weeks.  [phone]

## 2024-02-06 LAB — BASIC METABOLIC PANEL WITH GFR
Anion gap: 12 (ref 5–15)
BUN: 16 mg/dL (ref 8–23)
CO2: 26 mmol/L (ref 22–32)
Calcium: 8.4 mg/dL — ABNORMAL LOW (ref 8.9–10.3)
Chloride: 100 mmol/L (ref 98–111)
Creatinine, Ser: 0.61 mg/dL (ref 0.44–1.00)
GFR, Estimated: >60 mL/min (ref >60)
Glucose, Bld: 112 mg/dL — ABNORMAL HIGH (ref 70–99)
Potassium: 3.3 mmol/L — ABNORMAL LOW (ref 3.5–5.1)
Sodium: 138 mmol/L (ref 135–145)

## 2024-02-06 LAB — CBC
HCT: 31 % — ABNORMAL LOW (ref 36.0–46.0)
Hemoglobin: 10.5 g/dL — ABNORMAL LOW (ref 12.0–15.0)
MCH: 31.3 pg (ref 26.0–34.0)
MCHC: 33.9 g/dL (ref 30.0–36.0)
MCV: 92.3 fL (ref 80.0–100.0)
Platelets: 131 K/uL — ABNORMAL LOW (ref 150–400)
RBC: 3.36 MIL/uL — ABNORMAL LOW (ref 3.87–5.11)
RDW: 14 % (ref 11.5–15.5)
WBC: 8.8 K/uL (ref 4.0–10.5)
nRBC: 0 % (ref 0.0–0.2)

## 2024-02-06 MED ORDER — POTASSIUM CHLORIDE CRYS ER 20 MEQ PO TBCR
40.0000 meq | EXTENDED_RELEASE_TABLET | Freq: Once | ORAL | Status: AC
  Administered 2024-02-06: 40 meq via ORAL
  Filled 2024-02-06: qty 2

## 2024-02-06 MED ORDER — SODIUM CHLORIDE 0.9 % IV SOLN: INTRAVENOUS | Status: AC

## 2024-02-06 NOTE — Progress Notes (Signed)
 Case discussed with Dr. Nadean  He states he saw patient in the ER but was unable to leave a progress note due to technical reasons  Follow-up with him 2 weeks or if she has an orthopedic surgeon that she follows for other things Nonweightbearing for that hand  Charlene Vincent. Tanda, MD, FACS General, Bariatric, & Minimally Invasive Surgery [facility],  [facility]

## 2024-02-06 NOTE — TOC Progression Note (Signed)
 Transition of Care [facility]) - Progression Note    Patient Details  Name: Charlene Vincent MRN: [id number] Date of Birth: [birth year]  Transition of Care [facility]) CM/SW Contact  Gwenn Frieze [place], [place] Phone Number: 02/06/2024, 12:48 PM  Clinical Narrative: Per RN, pt's son now agreeable to considering SNF. Spoke with [name] who confirmed agreeable to SNF provided pt's social security check is not required as they use this to cover bills. Reviewed SNF placement process for STR and answered questions. Pt's son requesting [facility]. Will f/u with offers as available.     Frieze Gwenn, MSW, LCSW [phone] (coverage)                          Expected Discharge Plan and Services                                               Social Drivers of Health (SDOH) Interventions SDOH Screenings   Food Insecurity: Patient Unable To Answer (02/04/2024)  Housing: Unknown (02/04/2024)  Transportation Needs: Patient Unable To Answer (02/04/2024)  Utilities: Patient Unable To Answer (02/04/2024)  Social Connections: Patient Unable To Answer (02/04/2024)  Tobacco Use: Medium Risk (01/27/2024)   Received from [facility]    Readmission Risk Interventions     No data to display

## 2024-02-06 NOTE — NC FL2 (Signed)
 [place]  MEDICAID FL2 LEVEL OF CARE FORM     IDENTIFICATION  Patient Name: Charlene Vincent Birthdate: [birth year] Sex: female Admission Date (Current Location): 02/03/2024  [facility] and [place] Number:  Producer, Television/film/video and Address:  [facility] [place], [address] [place], [place] [address]      Provider Number: [id number]  Attending Physician Name and Address:  Md, Trauma, MD  Relative Name and Phone Number:       Current Level of Care:   Recommended Level of Care: Skilled Nursing Facility Prior Approval Number:    Date Approved/Denied:   PASRR Number: [id number]  Discharge Plan: SNF    Current Diagnoses: Patient Active Problem List   Diagnosis Date Noted   SDH (subdural hematoma) (HCC) 02/03/2024   Moderate malnutrition 06/13/2023   Urinary tract infection 09/29/2021   Chronic HFrEF (heart failure with reduced ejection fraction) (HCC) 09/29/2021   Hiatal hernia 09/29/2021   Ileus (HCC) 09/28/2021   Open wound of foot excluding toes 01/22/2015   Hyperlipidemia 10/25/2014   Takotsubo cardiomyopathy    Hyponatremia 07/20/2011   Hypothyroidism 07/17/2011   HTN (hypertension) 07/17/2011    Orientation RESPIRATION BLADDER Height & Weight     Self  Normal Continent, External catheter Weight:   Height:     BEHAVIORAL SYMPTOMS/MOOD NEUROLOGICAL BOWEL NUTRITION STATUS      Continent Diet (Dysphagia 1 (puree);Nectar-thick liquid)  AMBULATORY STATUS COMMUNICATION OF NEEDS Skin   Extensive Assist Verbally Other (Comment) (see notes)                       Personal Care Assistance Level of Assistance  Bathing, Feeding, Dressing Bathing Assistance: Limited assistance Feeding assistance: Limited assistance Dressing Assistance: Limited assistance     Functional Limitations Info  Sight, Hearing, Speech Sight Info: Impaired Hearing Info: Adequate Speech Info: Impaired    SPECIAL CARE FACTORS FREQUENCY  PT (By licensed PT), OT  (By licensed OT), Speech therapy                    Contractures Contractures Info: Not present    Additional Factors Info  Code Status Code Status Info: FULL CODE             Current Medications (02/06/2024):  This is the current hospital active medication list Current Facility-Administered Medications  Medication Dose Route Frequency Provider Last Rate Last Admin   acetaminophen  (TYLENOL ) tablet 1,000 mg  1,000 mg Oral Q6H Maczis, Michael M, PA-C   1,000 mg at 02/06/24 1212   atorvastatin  (LIPITOR ) tablet 80 mg  80 mg Oral Daily Maczis, Michael M, PA-C   80 mg at 02/06/24 0901   carvedilol  (COREG ) tablet 3.125 mg  3.125 mg Oral BID WC Maczis, Michael M, PA-C   3.125 mg at 02/06/24 0900   Chlorhexidine  Gluconate Cloth 2 % PADS 6 each  6 each Topical Daily Paola Dreama SAILOR, MD   6 each at 02/06/24 0901   docusate sodium  (COLACE) capsule 100 mg  100 mg Oral BID Maczis, Michael M, PA-C   100 mg at 02/06/24 0901   feeding supplement (ENSURE PLUS HIGH PROTEIN) liquid 237 mL  237 mL Oral BID BM Maczis, Michael M, PA-C   237 mL at 02/06/24 1240   hydrALAZINE  (APRESOLINE ) injection 10 mg  10 mg Intravenous Q2H PRN Maczis, Michael M, PA-C       HYDROmorphone  (DILAUDID ) injection 0.5 mg  0.5 mg Intravenous Q3H PRN Maczis,  Ozell HERO, PA-C   0.5 mg at 02/04/24 0021   levETIRAcetam  (KEPPRA ) 100 MG/ML solution 500 mg  500 mg Oral BID Tanda Locus, MD   500 mg at 02/06/24 0901   levothyroxine  (SYNTHROID ) tablet 112 mcg  112 mcg Oral Q24H Maczis, Michael M, PA-C   112 mcg at 02/06/24 0541   lisinopril  (ZESTRIL ) tablet 2.5 mg  2.5 mg Oral Daily Maczis, Michael M, PA-C   2.5 mg at 02/06/24 0901   melatonin tablet 3 mg  3 mg Oral QHS PRN Maczis, Michael M, PA-C       metoprolol  tartrate (LOPRESSOR ) injection 5 mg  5 mg Intravenous Q6H PRN Maczis, Michael M, PA-C       ondansetron  (ZOFRAN -ODT) disintegrating tablet 4 mg  4 mg Oral Q6H PRN Maczis, Michael M, PA-C       Or   ondansetron  (ZOFRAN )  injection 4 mg  4 mg Intravenous Q6H PRN Maczis, Michael M, PA-C       oxyCODONE  (Oxy IR/ROXICODONE ) immediate release tablet 2.5-5 mg  2.5-5 mg Oral Q4H PRN Maczis, Michael M, PA-C   5 mg at 02/06/24 1239   polyethylene glycol (MIRALAX  / GLYCOLAX ) packet 17 g  17 g Oral Daily PRN Maczis, Michael M, PA-C   17 g at 02/06/24 1207   tamsulosin  (FLOMAX ) capsule 0.4 mg  0.4 mg Oral Daily Simaan, Elizabeth S, PA-C   0.4 mg at 02/06/24 9094     Discharge Medications: Please see discharge summary for a list of discharge medications.  Relevant Imaging Results:  Relevant Lab Results:   Additional Information SS# [id number]  Ysidro Ramsay [place], [place]

## 2024-02-06 NOTE — Progress Notes (Signed)
 Nurse reports patient has not voided since Foley catheter removed.  Bladder scan 0 Nurse reports patient not eating and drinking that much Started her on some low-dose IV fluids  Yoshiye Kraft M. Tanda, MD, FACS General, Bariatric, & Minimally Invasive Surgery [facility] Surgery,  A [facility]

## 2024-02-06 NOTE — Plan of Care (Signed)

## 2024-02-06 NOTE — Progress Notes (Signed)
 Patient ID: Charlene Vincent, female   DOB: [birth year], 89 y.o.   MRN: [id number]   Trauma Service Progress Note:    Chief Complaint/Subjective: No c/o Pt drinking a protein shake  Objective: Vital signs in last 24 hours: Temp:  [97.6 F (36.4 C)-99 F (37.2 C)] 97.6 F (36.4 C) (12/14 0730) Pulse Rate:  [65-85] 67 (12/14 0900) Resp:  [10-21] 17 (12/14 0900) BP: (115-150)/(59-72) 137/68 (12/14 0900) SpO2:  [94 %-98 %] 97 % (12/14 0900) Last BM Date :  (pta)  Intake/Output from previous day: 12/13 0701 - 12/14 0700 In: -  Out: 350 [Urine:350] Intake/Output this shift: Total I/O In: 120 [P.O.:120] Out: -   Lungs:  nonlabored  Cardiovascular: reg  Abd: soft, nt,   Extremities: no edema, +SCDs; L UE splint  Neuro: alert, nonfocal, pleasant confusion  Skin: extensive bruising face/neck  Lab Results: CBC  Recent Labs    02/05/24 1224 02/06/24 0453  WBC 9.1 8.8  HGB 11.4* 10.5*  HCT 33.8* 31.0*  PLT 149* 131*   BMET Recent Labs    02/05/24 1224 02/06/24 0453  NA 134* 138  K 3.6 3.3*  CL 105 100  CO2 22 26  GLUCOSE 145* 112*  BUN 17 16  CREATININE 0.69 0.61  CALCIUM  8.2* 8.4*   LFT    Latest Ref Rng & Units 06/12/2023    8:48 AM 09/28/2021    6:46 PM 07/20/2021    8:54 PM  Hepatic Function  Total Protein 6.5 - 8.1 g/dL 6.4  6.5  7.1   Albumin 3.5 - 5.0 g/dL 3.4  3.4  4.0   AST 15 - 41 U/L 30  43  31   ALT 0 - 44 U/L 25  38  26   Alk Phosphatase 38 - 126 U/L 74  84  84   Total Bilirubin 0.0 - 1.2 mg/dL 0.6  0.3  0.4    PT/INR No results for input(s): LABPROT, INR in the last 72 hours. ABG No results for input(s): PHART, HCO3 in the last 72 hours.  Invalid input(s): PCO2, PO2  Studies/Results:  Anti-infectives: Anti-infectives (From admission, onward)    Start     Dose/Rate Route Frequency Ordered Stop   02/03/24 0900  clindamycin  (CLEOCIN ) IVPB 900 mg        900 mg 100 mL/hr over 30 Minutes Intravenous  Once 02/03/24 0855  02/03/24 1038       Medications: Scheduled Meds:  acetaminophen   1,000 mg Oral Q6H   atorvastatin   80 mg Oral Daily   carvedilol   3.125 mg Oral BID WC   Chlorhexidine  Gluconate Cloth  6 each Topical Daily   docusate sodium   100 mg Oral BID   feeding supplement  237 mL Oral BID BM   levETIRAcetam   500 mg Oral BID   levothyroxine   112 mcg Oral Q24H   lisinopril   2.5 mg Oral Daily   methocarbamol   500 mg Oral Q8H   Or   methocarbamol  (ROBAXIN ) injection  500 mg Intravenous Q8H   tamsulosin   0.4 mg Oral Daily   Continuous Infusions: PRN Meds:.hydrALAZINE , HYDROmorphone  (DILAUDID ) injection, melatonin, metoprolol  tartrate, ondansetron  **OR** ondansetron  (ZOFRAN ) IV, oxyCODONE , polyethylene glycol  Assessment/Plan: Patient Active Problem List   Diagnosis Date Noted   SDH (subdural hematoma) (HCC) 02/03/2024   Moderate malnutrition 06/13/2023   Urinary tract infection 09/29/2021   Chronic HFrEF (heart failure with reduced ejection fraction) (HCC) 09/29/2021   Hiatal hernia 09/29/2021   Ileus (HCC) 09/28/2021  Open wound of foot excluding toes 01/22/2015   Hyperlipidemia 10/25/2014   Takotsubo cardiomyopathy    Hyponatremia 07/20/2011   Hypothyroidism 07/17/2011   HTN (hypertension) 07/17/2011   GLF TBI/B SDH - Per NSGY, Dr. Lanis, [facility] in 8 hours was improving - SDH no longer visualized, Keppra , neuro checks, TBI therapies.  [name] w/ R epistaxis - Per ENT, Dr. Roark; tentative plan for non-op management, appreciate their recs.; f/u 1-2 weeks L distal radial fx - Per Hand surgery, Dr. Lorretta, pending. - still don't see a note from hand sx, will try to reach out to Dr Lorretta today (he is not on call though) Sternal pain - EKG normal sinus. Tele normal sinus this morning. CXR w/o acute abnormalities. R tib/fib pain - xray neg Hx Hypothyroidism - home meds Hx Takotsubo's cardiomyopathy  Hx chronic pain - on Percocet 2.5-5mg  prn at baseline    FEN - Dys 1 ENT  recs.; hypokalemia - replace VTE - SCDs, chem ppx on hold till cleared by NSGY ID - None currently Foley - required I&O, Foley placed 12/12 ~0415. Will try another voiding trial today Dispo - Admit to 4NP. Looks son is interested in pt going home with [facility] services, voiding trial today   OT - Skilled nursing recs PT - Skilled nursing recs    LOS: 2 days    I reviewed specialist notes,  nursing notes, last 24 h vitals and pain scores, last 48 h intake and output, last 24 h labs and trends, and last 24 h imaging results.   This care required low level of medical decision making.  Disposition:  LOS: 3 days    Camellia HERO. Tanda, MD, FACS General, Bariatric, & Minimally Invasive Surgery [phone] [facility] Surgery, A [facility]

## 2024-02-07 LAB — BASIC METABOLIC PANEL WITH GFR
Anion gap: 4 — ABNORMAL LOW (ref 5–15)
BUN: 19 mg/dL (ref 8–23)
CO2: 26 mmol/L (ref 22–32)
Calcium: 8.1 mg/dL — ABNORMAL LOW (ref 8.9–10.3)
Chloride: 112 mmol/L — ABNORMAL HIGH (ref 98–111)
Creatinine, Ser: 0.6 mg/dL (ref 0.44–1.00)
GFR, Estimated: >60 mL/min (ref >60)
Glucose, Bld: 96 mg/dL (ref 70–99)
Potassium: 4.1 mmol/L (ref 3.5–5.1)
Sodium: 142 mmol/L (ref 135–145)

## 2024-02-07 LAB — MAGNESIUM: Magnesium: 1.8 mg/dL (ref 1.7–2.4)

## 2024-02-07 MED ORDER — ENOXAPARIN SODIUM 30 MG/0.3ML IJ SOSY
30.0000 mg | PREFILLED_SYRINGE | Freq: Two times a day (BID) | INTRAMUSCULAR | Status: DC
  Administered 2024-02-07 – 2024-02-11 (×8): 30 mg via SUBCUTANEOUS
  Filled 2024-02-07 (×8): qty 0.3

## 2024-02-07 NOTE — TOC Progression Note (Signed)
 Transition of Care [facility]) - Progression Note    Patient Details  Name: Charlene Vincent MRN: [id number] Date of Birth: 04-15-1934  Transition of Care [facility]) CM/SW Contact  Rolinda Impson E Adrain Butrick, LCSW Phone Number: 02/07/2024, 9:10 AM  Clinical Narrative:    Awaiting SNF bed offers.                     Expected Discharge Plan and Services                                               Social Drivers of Health (SDOH) Interventions SDOH Screenings   Food Insecurity: Patient Unable To Answer (02/04/2024)  Housing: Unknown (02/04/2024)  Transportation Needs: Patient Unable To Answer (02/04/2024)  Utilities: Patient Unable To Answer (02/04/2024)  Social Connections: Patient Unable To Answer (02/04/2024)  Tobacco Use: Medium Risk (01/27/2024)   Received from [facility]    Readmission Risk Interventions     No data to display

## 2024-02-07 NOTE — Plan of Care (Signed)

## 2024-02-07 NOTE — Progress Notes (Signed)
 Speech Language Pathology Treatment: Dysphagia  Patient Details Name: Charlene Vincent MRN: [id number] DOB: Sep 21, 1934 Today's Date: 02/07/2024 Time: 8455-8441 SLP Time Calculation (min) (ACUTE ONLY): 14 min  Assessment / Plan / Recommendation Clinical Impression  Pt has reduced edema around her mouth and particularly her upper lip. There is still reduced facial ROM on her R lower face, but she can achieve better labial seal for bolus acceptance and containment. Functional improvements include being able to suck from a straw and exhibiting significantly less anterior loss of thin liquids. She does still have some anterior spillage, and there was a cough x1 with thin liquids. Overall her control still looks better with mildly thick consistencies.   PLAN: Continue Dys 1 (puree) diet and nectar thick liquids. Would offer thin water, in between meals and after oral care. Hopeful for further progress to advance to thin liquids more consistently although note that she will need to remain on purees pending medical clearance from ENT.    HPI HPI: Charlene Vincent is an 89 yo presenting 12/11 s/p GLF at home. Work up revealed TBI/B SDH, [place] II fx with R epistaxis, and L distal radial fx. Previous clinical swallow eval in 2023 was [facility]. BSE completed 02/04/24 recommending initiate PO diet of dys 1 (puree) solids, nectar thick liquids. PMH includes: hypothyroidism, Takotsubo CM, prior MI, PNA      SLP Plan  Continue with current plan of care        Swallow Evaluation Recommendations   Recommendations: PO diet PO Diet Recommendation: Dysphagia 1 (Pureed);Mildly thick liquids (Level 2, nectar thick) (sips of thin water in between meals after oral care) Liquid Administration via: Cup;Straw Medication Administration: Crushed with puree Supervision: Staff to assist with self-feeding;Full supervision/cueing for swallowing strategies Postural changes: Position pt fully upright for meals Oral care  recommendations: Oral care BID (2x/day);Oral care before ice chips/water     Recommendations                     Oral care BID   Frequent or constant Supervision/Assistance Dysphagia, unspecified (R13.10)     Continue with current plan of care     Leita SAILOR., M.A. CCC-SLP Acute Rehabilitation Services Office: [phone]  Secure chat preferred   02/07/2024, 4:13 PM

## 2024-02-07 NOTE — Progress Notes (Signed)
 Subjective: Oriented to self, but nothing else.  She states she is voiding and pooping in her bed.  Says she is eating ok.    ROS: See above, otherwise other systems negative  Objective: Vital signs in last 24 hours: Temp:  [98.3 F (36.8 C)-98.7 F (37.1 C)] 98.3 F (36.8 C) (12/15 0245) Pulse Rate:  [64-81] 70 (12/15 0245) Resp:  [12-19] 14 (12/15 0245) BP: (105-157)/(55-74) 115/61 (12/15 0245) SpO2:  [92 %-98 %] 93 % (12/15 0245) Last BM Date :  (PTA)  Intake/Output from previous day: 12/14 0701 - 12/15 0700 In: 758.9 [P.O.:280; I.V.:478.9] Out: 975 [Urine:975] Intake/Output this shift: No intake/output data recorded.  PE: Gen: NAD HEENT: significant ecchymosis, no teeth Heart: regular Lungs: CTAB, respiratory effort unlabored Abd: soft, NT Ext: MAEs, LUE in splint.  Has normal sensation and moves her fingers.  Ecchymosis to L knee Neuro: grossly intact Psych: oriented to self only  Lab Results:  Recent Labs    02/05/24 1224 02/06/24 0453  WBC 9.1 8.8  HGB 11.4* 10.5*  HCT 33.8* 31.0*  PLT 149* 131*   BMET Recent Labs    02/05/24 1224 02/06/24 0453  NA 134* 138  K 3.6 3.3*  CL 105 100  CO2 22 26  GLUCOSE 145* 112*  BUN 17 16  CREATININE 0.69 0.61  CALCIUM  8.2* 8.4*   PT/INR No results for input(s): LABPROT, INR in the last 72 hours. CMP     Component Value Date/Time   NA 138 02/06/2024 0453   NA 143 12/28/2017 1538   K 3.3 (L) 02/06/2024 0453   CL 100 02/06/2024 0453   CO2 26 02/06/2024 0453   GLUCOSE 112 (H) 02/06/2024 0453   BUN 16 02/06/2024 0453   BUN 11 12/28/2017 1538   CREATININE 0.61 02/06/2024 0453   CREATININE 0.60 03/14/2015 1514   CALCIUM  8.4 (L) 02/06/2024 0453   PROT 6.4 (L) 06/12/2023 0848   ALBUMIN 3.4 (L) 06/12/2023 0848   AST 30 06/12/2023 0848   ALT 25 06/12/2023 0848   ALKPHOS 74 06/12/2023 0848   BILITOT 0.6 06/12/2023 0848   GFRNONAA >60 02/06/2024 0453   GFRAA 85 12/28/2017 1538   Lipase      Component Value Date/Time   LIPASE 59 (H) 06/12/2023 0848       Studies/Results: No results found.  Anti-infectives: Anti-infectives (From admission, onward)    Start     Dose/Rate Route Frequency Ordered Stop   02/03/24 0900  clindamycin  (CLEOCIN ) IVPB 900 mg        900 mg 100 mL/hr over 30 Minutes Intravenous  Once 02/03/24 0855 02/03/24 1038        Assessment/Plan GLF TBI/B SDH - Per NSGY, Dr. Lanis, [facility] in 8 hours was improving - SDH no longer visualized, Keppra , neuro checks, TBI therapies.  [name] w/ R epistaxis - Per ENT, Dr. Roark; tentative plan for non-op management, appreciate their recs.; f/u 1-2 weeks, D1 diet L distal radial fx - Per Hand surgery, Dr. Lorretta, non-op follow up as outpatient Sternal pain - EKG normal sinus. Tele normal sinus this morning. CXR w/o acute abnormalities. R tib/fib pain - xray neg Hx Hypothyroidism - home meds Hx Takotsubo's cardiomyopathy  Hx chronic pain - on Percocet 2.5-5mg  prn at baseline  Confusion/hospital delirium - delirium precautions, monitor   FEN - Dys 1 ENT recs.; hypokalemia - replaced 12/14, BMET pending today VTE - SCDs, chem ppx on hold till cleared  by NSGY, reaching out to them today ID - None currently Foley - required I&O, Foley placed 12/12 ~0415. TOV 12/14, voiding with purewick today Dispo - 4NP, therapies, rec SNF, no family at bedside  I reviewed nursing notes, last 24 h vitals and pain scores, last 48 h intake and output, last 24 h labs and trends, last 24 h imaging results, and reached out to NSGY for updated recs.   LOS: 4 days    Burnard FORBES Banter , [facility] Surgery 02/07/2024, 10:12 AM Please see Amion for pager number during day hours 7:00am-4:30pm or 7:00am -11:30am on weekends

## 2024-02-07 NOTE — Progress Notes (Signed)
 Pt initially presented after ground level fall at home. Not anticoagulated. Nonfocal neurologic exam. Workup revealed trace b/l anterior SDH. Repeat CTH displayed resolution of previously seen SDH. She can f/u prn on outpatient basis.   Heaven Wandell CAYLIN Rommie Dunn, PA-C

## 2024-02-08 LAB — BASIC METABOLIC PANEL WITH GFR
Anion gap: 10 (ref 5–15)
BUN: 18 mg/dL (ref 8–23)
CO2: 22 mmol/L (ref 22–32)
Calcium: 8.7 mg/dL — ABNORMAL LOW (ref 8.9–10.3)
Chloride: 109 mmol/L (ref 98–111)
Creatinine, Ser: 0.6 mg/dL (ref 0.44–1.00)
GFR, Estimated: >60 mL/min (ref >60)
Glucose, Bld: 107 mg/dL — ABNORMAL HIGH (ref 70–99)
Potassium: 4 mmol/L (ref 3.5–5.1)
Sodium: 141 mmol/L (ref 135–145)

## 2024-02-08 LAB — CBC
HCT: 33.4 % — ABNORMAL LOW (ref 36.0–46.0)
Hemoglobin: 10.9 g/dL — ABNORMAL LOW (ref 12.0–15.0)
MCH: 30.8 pg (ref 26.0–34.0)
MCHC: 32.6 g/dL (ref 30.0–36.0)
MCV: 94.4 fL (ref 80.0–100.0)
Platelets: 168 K/uL (ref 150–400)
RBC: 3.54 MIL/uL — ABNORMAL LOW (ref 3.87–5.11)
RDW: 14.5 % (ref 11.5–15.5)
WBC: 10.1 K/uL (ref 4.0–10.5)
nRBC: 0 % (ref 0.0–0.2)

## 2024-02-08 NOTE — Progress Notes (Signed)
 Patient ID: Charlene Vincent, female   DOB: 02-Mar-1934, 89 y.o.   MRN: [id number]      Subjective: Up in chair, eating, C/O some old blood draining from her nose ROS negative except as listed above. Objective: Vital signs in last 24 hours: Temp:  [97.6 F (36.4 C)-98.8 F (37.1 C)] 98.3 F (36.8 C) (12/16 0731) Pulse Rate:  [68-80] 69 (12/16 0911) Resp:  [12-19] 12 (12/16 0731) BP: (112-152)/(60-81) 125/60 (12/16 0911) SpO2:  [93 %-97 %] 94 % (12/16 0731) Last BM Date :  (PTA)  Intake/Output from previous day: 12/15 0701 - 12/16 0700 In: 681.8 [P.O.:300; I.V.:381.8] Out: 450 [Urine:450] Intake/Output this shift: No intake/output data recorded.  General appearance: alert and cooperative Head: evolving facial ecchymoses, less swelling Resp: CTA Chest wall: contusion evolving down chest GI: soft, NT Extremities: calves soft  Lab Results: CBC  Recent Labs    02/06/24 0453 02/08/24 0149  WBC 8.8 10.1  HGB 10.5* 10.9*  HCT 31.0* 33.4*  PLT 131* 168   BMET Recent Labs    02/07/24 0937 02/08/24 0149  NA 142 141  K 4.1 4.0  CL 112* 109  CO2 26 22  GLUCOSE 96 107*  BUN 19 18  CREATININE 0.60 0.60  CALCIUM  8.1* 8.7*   PT/INR No results for input(s): LABPROT, INR in the last 72 hours. ABG No results for input(s): PHART, HCO3 in the last 72 hours.  Invalid input(s): PCO2, PO2  Studies/Results: No results found.  Anti-infectives: Anti-infectives (From admission, onward)    Start     Dose/Rate Route Frequency Ordered Stop   02/03/24 0900  clindamycin  (CLEOCIN ) IVPB 900 mg        900 mg 100 mL/hr over 30 Minutes Intravenous  Once 02/03/24 0855 02/03/24 1038       Assessment/Plan: GLF TBI/B SDH - Per NSGY, Dr. Lanis, [facility] in 8 hours was improving - SDH no longer visualized, Keppra , neuro checks, TBI therapies.  [name] w/ R epistaxis - Per ENT, Dr. Roark; tentative plan for non-op management, appreciate their recs.; f/u 1-2  weeks, D1 diet L distal radial fx - Per Hand surgery, Dr. Lorretta, non-op follow up as outpatient Sternal pain - EKG normal sinus. R tib/fib pain - xray neg Hx Hypothyroidism - home meds Hx Takotsubo's cardiomyopathy  Hx chronic pain - on Percocet 2.5-5mg  prn at baseline  Confusion/hospital delirium - delirium precautions, monitor   FEN - Dys 1 ENT recs. VTE - LMWH ID - None currently Foley - required I&O, Foley placed 12/12 ~0415. TOV 12/14, voiding with purewick now Dispo - 4NP, therapies, plan SNF   LOS: 5 days    Dann Hummer, MD, MPH, FACS Trauma & General Surgery Use [email or web address] to contact on call provider  02/08/2024

## 2024-02-08 NOTE — Care Management Important Message (Signed)
 Important Message  Patient Details  Name: Charlene Vincent MRN: [id number] Date of Birth: August 17, 1934   Important Message Given:  Yes - Medicare IM     Jennie Laneta Dragon 02/08/2024, 12:51 PM

## 2024-02-08 NOTE — Plan of Care (Signed)

## 2024-02-08 NOTE — TOC Progression Note (Addendum)
 Transition of Care [facility]) - Progression Note    Patient Details  Name: Charlene Vincent MRN: [id number] Date of Birth: August 15, 1934  Transition of Care [facility]) CM/SW Contact  Millenia Waldvogel E Abdulahad Mederos, LCSW Phone Number: 02/08/2024, 9:39 AM  Clinical Narrative:    Patient has 1 bed offer at [facility]. CSW called Logan in Admissions who confirmed patient would not have to turn over check for STR at [facility] through her [facility] plan.  LVM for patient's son [name] to present bed offer, requested return call.   2:15- Spoke with patient's son [name] who accepts bed offers at [facility]. Checked with PA, patient appears medically ready for SNF - CSW asked Logan at [facility] to start auth.                    Expected Discharge Plan and Services                                               Social Drivers of Health (SDOH) Interventions SDOH Screenings   Food Insecurity: Patient Unable To Answer (02/04/2024)  Housing: Unknown (02/04/2024)  Transportation Needs: Patient Unable To Answer (02/04/2024)  Utilities: Patient Unable To Answer (02/04/2024)  Social Connections: Patient Unable To Answer (02/04/2024)  Tobacco Use: Medium Risk (01/27/2024)   Received from [facility]    Readmission Risk Interventions     No data to display

## 2024-02-08 NOTE — Progress Notes (Signed)
 Physical Therapy Treatment Patient Details Name: Charlene Vincent MRN: [id number] DOB: 1934-10-31 Today's Date: 02/08/2024   History of Present Illness Pt is 89 yo presenting to [facility] On 12/11 due to a fall. Pt hit her face with multiple facial fractures present.  L wrist fx. PMH significant for frailty of advanced age, ischemic cardiomyopathy, moderate malnutrition, chronic hyponatremia.    PT Comments  Pt received in bed agreeable to participation in therapy. She required mod assist bed mobility, mod assist sit to stand, and mod/HHA amb 5' bed to recliner. Gait distance limited by urinary urgency. Pt in recliner at end of session with feet elevated. Current POC remains appropriate.     If plan is discharge home, recommend the following: A little help with walking and/or transfers;Help with stairs or ramp for entrance;Assistance with cooking/housework;Assist for transportation;Supervision due to cognitive status   Can travel by private vehicle     Yes  Equipment Recommendations  BSC/3in1;Rolling walker (2 wheels)    Recommendations for Other Services       Precautions / Restrictions Precautions Precautions: Fall Recall of Precautions/Restrictions: Impaired Restrictions LUE Weight Bearing Per Provider Order: Weight bear through elbow only Other Position/Activity Restrictions: no WB orders in chart. Maintained NWB L wrist due to fx.     Mobility  Bed Mobility Overal bed mobility: Needs Assistance Bed Mobility: Supine to Sit     Supine to sit: Mod assist, HOB elevated, Used rails     General bed mobility comments: assist to elevate trunk and scoot to EOB    Transfers Overall transfer level: Needs assistance Equipment used: 1 person hand held assist Transfers: Sit to/from Stand Sit to Stand: Mod assist           General transfer comment: assist to power up and stabilize balance. Pt maintaining flexed posture.    Ambulation/Gait Ambulation/Gait assistance: Mod  assist Gait Distance (Feet): 5 Feet Assistive device: 1 person hand held assist Gait Pattern/deviations: Step-through pattern, Decreased stride length, Shuffle       General Gait Details: Gait distance limited by urinary urgency.   Stairs             Wheelchair Mobility     Tilt Bed    Modified Rankin (Stroke Patients Only)       Balance Overall balance assessment: Needs assistance Sitting-balance support: Feet supported, No upper extremity supported Sitting balance-Leahy Scale: Fair     Standing balance support: Single extremity supported, During functional activity Standing balance-Leahy Scale: Poor Standing balance comment: reliant on external support.                            Communication Communication Communication: Impaired Factors Affecting Communication: Hearing impaired  Cognition Arousal: Alert Behavior During Therapy: Flat affect   PT - Cognitive impairments: No family/caregiver present to determine baseline, Orientation, Safety/Judgement, Problem solving, Initiation, Sequencing, Attention, Memory, Awareness   Orientation impairments: Place, Time, Situation                     Following commands: Impaired Following commands impaired: Follows one step commands with increased time    Cueing Cueing Techniques: Verbal cues, Gestural cues, Tactile cues  Exercises      General Comments General comments (skin integrity, edema, etc.): VSS on RA      Pertinent Vitals/Pain Pain Assessment Pain Assessment: Faces Faces Pain Scale: No hurt    Home Living  Prior Function            PT Goals (current goals can now be found in the care plan section) Progress towards PT goals: Progressing toward goals    Frequency    Min 2X/week      PT Plan      Co-evaluation              AM-PAC PT 6 Clicks Mobility   Outcome Measure  Help needed turning from your back to your side  while in a flat bed without using bedrails?: A Lot Help needed moving from lying on your back to sitting on the side of a flat bed without using bedrails?: A Lot Help needed moving to and from a bed to a chair (including a wheelchair)?: A Lot Help needed standing up from a chair using your arms (e.g., wheelchair or bedside chair)?: A Lot Help needed to walk in hospital room?: Total Help needed climbing 3-5 steps with a railing? : Total 6 Click Score: 10    End of Session Equipment Utilized During Treatment: Gait belt Activity Tolerance: Patient tolerated treatment well Patient left: in chair;with call bell/phone within reach;with chair alarm set Nurse Communication: Mobility status PT Visit Diagnosis: Unsteadiness on feet (R26.81);Other abnormalities of gait and mobility (R26.89);History of falling (Z91.81);Muscle weakness (generalized) (M62.81)     Time: 9185-9171 PT Time Calculation (min) (ACUTE ONLY): 14 min  Charges:    $Therapeutic Activity: 8-22 mins PT General Charges $$ ACUTE PT VISIT: 1 Visit                     Sari MATSU., PT  Office # [phone]    Erven Sari Shaker 02/08/2024, 9:00 AM

## 2024-02-09 MED ORDER — THIAMINE MONONITRATE 100 MG PO TABS
100.0000 mg | ORAL_TABLET | Freq: Every day | ORAL | Status: DC
  Administered 2024-02-09 – 2024-02-11 (×3): 100 mg via ORAL
  Filled 2024-02-09 (×3): qty 1

## 2024-02-09 MED ORDER — OXYMETAZOLINE HCL 0.05 % NA SOLN
1.0000 | Freq: Two times a day (BID) | NASAL | Status: DC | PRN
  Administered 2024-02-09 (×2): 1 via NASAL
  Filled 2024-02-09 (×2): qty 30

## 2024-02-09 MED ORDER — POLYETHYLENE GLYCOL 3350 17 G PO PACK
17.0000 g | PACK | Freq: Two times a day (BID) | ORAL | Status: DC
  Administered 2024-02-09 – 2024-02-11 (×4): 17 g via ORAL
  Filled 2024-02-09 (×4): qty 1

## 2024-02-09 MED ORDER — SODIUM CHLORIDE 0.9 % IV BOLUS
1000.0000 mL | Freq: Once | INTRAVENOUS | Status: AC
  Administered 2024-02-09: 21:00:00 1000 mL via INTRAVENOUS

## 2024-02-09 MED ORDER — VITAMIN D 25 MCG (1000 UNIT) PO TABS
1000.0000 [IU] | ORAL_TABLET | Freq: Every day | ORAL | Status: DC
  Administered 2024-02-09 – 2024-02-11 (×3): 1000 [IU] via ORAL
  Filled 2024-02-09 (×3): qty 1

## 2024-02-09 MED ORDER — SODIUM CHLORIDE 0.9 % IV BOLUS
1000.0000 mL | Freq: Once | INTRAVENOUS | Status: AC
  Administered 2024-02-09: 23:00:00 1000 mL via INTRAVENOUS

## 2024-02-09 MED ORDER — ADULT MULTIVITAMIN W/MINERALS CH
1.0000 | ORAL_TABLET | Freq: Every day | ORAL | Status: DC
  Administered 2024-02-09 – 2024-02-11 (×3): 1 via ORAL
  Filled 2024-02-09 (×3): qty 1

## 2024-02-09 NOTE — Plan of Care (Signed)

## 2024-02-09 NOTE — Progress Notes (Cosign Needed Addendum)
 Subjective: CC: Called to room for R epistaxis. Denies local trauma to the area overnight/this am. Airway intact, no sob, breathing okay, HDS.   Reports facial pain. Pain well controlled. No new or other areas of pain. Tolerating po. No n/v. Voiding. No BM. Working with therapies. Mod assist and amb 5 ft 1 person hand held assist yesterday. L wrist splint not in place.   IV's out overnight. Not using any IV medications in the last 24 hours.   Objective: Vital signs in last 24 hours: Temp:  [97.8 F (36.6 C)-98.3 F (36.8 C)] 98.2 F (36.8 C) (12/17 0731) Pulse Rate:  [70-77] 70 (12/17 0731) Resp:  [14-20] 15 (12/17 0731) BP: (115-150)/(64-78) 137/68 (12/17 0731) SpO2:  [92 %-96 %] 92 % (12/17 0731) Weight:  [47.4 kg] 47.4 kg (12/16 2302) Last BM Date :  (PTA)  Intake/Output from previous day: 12/16 0701 - 12/17 0700 In: 320 [P.O.:320] Out: 450 [Urine:450] Intake/Output this shift: No intake/output data recorded.  PE: Gen:  Alert, NAD, pleasant HEENT: Epistaxis from R nare, gauze packing applied, dried blood in oropharynx.  Card:  Reg Pulm:  CTAB, no W/R/R, effort normal Abd: Soft, ND, NT Ext: L wrist splint not in place. MAE's. No LE edema or calf tenderness. Ext x 4 wwp Psych: A&Ox3   Lab Results:  Recent Labs    02/08/24 0149  WBC 10.1  HGB 10.9*  HCT 33.4*  PLT 168   BMET Recent Labs    02/07/24 0937 02/08/24 0149  NA 142 141  K 4.1 4.0  CL 112* 109  CO2 26 22  GLUCOSE 96 107*  BUN 19 18  CREATININE 0.60 0.60  CALCIUM  8.1* 8.7*   PT/INR No results for input(s): LABPROT, INR in the last 72 hours. CMP     Component Value Date/Time   NA 141 02/08/2024 0149   NA 143 12/28/2017 1538   K 4.0 02/08/2024 0149   CL 109 02/08/2024 0149   CO2 22 02/08/2024 0149   GLUCOSE 107 (H) 02/08/2024 0149   BUN 18 02/08/2024 0149   BUN 11 12/28/2017 1538   CREATININE 0.60 02/08/2024 0149   CREATININE 0.60 03/14/2015 1514   CALCIUM  8.7 (L)  02/08/2024 0149   PROT 6.4 (L) 06/12/2023 0848   ALBUMIN 3.4 (L) 06/12/2023 0848   AST 30 06/12/2023 0848   ALT 25 06/12/2023 0848   ALKPHOS 74 06/12/2023 0848   BILITOT 0.6 06/12/2023 0848   GFRNONAA >60 02/08/2024 0149   GFRAA 85 12/28/2017 1538   Lipase     Component Value Date/Time   LIPASE 59 (H) 06/12/2023 0848    Studies/Results: No results found.  Anti-infectives: Anti-infectives (From admission, onward)    Start     Dose/Rate Route Frequency Ordered Stop   02/03/24 0900  clindamycin  (CLEOCIN ) IVPB 900 mg        900 mg 100 mL/hr over 30 Minutes Intravenous  Once 02/03/24 0855 02/03/24 1038        Assessment/Plan GLF TBI/B SDH - Per NSGY, Dr. Lanis, [facility] in 8 hours was improving - SDH no longer visualized, Keppra , neuro checks, TBI therapies.  [name] w/ R epistaxis - Per ENT, Dr. Roark; tentative plan for non-op management, appreciate their recs.; f/u 1-2 weeks, D1 diet. Recurrent epistaxis 12/17 - airway intact, hds, lungs clear on RA, hgb stable, will see if we can get a better look in nare and oropharynx followed by afrin and see  if we can get it to stop with conservative measures/pressure. If not may need Rhinorocket. Did discuss this with ENT who agree's.  L distal radial fx - Per Hand surgery, Dr. Lorretta, splint, non-op, follow up as outpatient Lip laceration - repaired 12/11 with absorbable sutures Sternal pain - EKG normal sinus. R tib/fib pain - xray neg Hx Hypothyroidism - home meds Hx Takotsubo's cardiomyopathy  Hx chronic pain - on Percocet 2.5-5mg  prn at baseline  Confusion/hospital delirium - delirium precautions, monitor   FEN - Dys 1 ENT recs. Increase bowel regimen.  VTE - SCDs, LMWH ID - None currently Foley - TOV 12/14, voiding with purewick now Dispo - 4NP, therapies, plan SNF  I reviewed nursing notes, last 24 h vitals and pain scores, last 48 h intake and output, last 24 h labs and trends, and last 24 h imaging  results.    LOS: 6 days    Ozell CHRISTELLA Shaper, [facility] 02/09/2024, 9:14 AM Please see Amion for pager number during day hours 7:00am-4:30pm

## 2024-02-09 NOTE — Progress Notes (Signed)
°   02/09/24 0731  Vitals  Temp 98.2 F (36.8 C)  Temp Source Oral  BP 137/68  MAP (mmHg) 87  BP Location Right Arm  BP Method Automatic  Patient Position (if appropriate) Lying  Pulse Rate 70  Pulse Rate Source Monitor  ECG Heart Rate 70  Resp 15  Level of Consciousness  Level of Consciousness Alert  MEWS COLOR  MEWS Score Color Green  Oxygen Therapy  SpO2 92 %  O2 Device Room Air  Pain Assessment  Pain Scale PAINAD  PAINAD (Pain Assessment in Advanced Dementia)  Breathing 0  Negative Vocalization 0  Facial Expression 0  Body Language 0  Consolability 0  PAINAD Score 0  MEWS Score  MEWS Temp 0  MEWS Systolic 0  MEWS Pulse 0  MEWS RR 0  MEWS LOC 0  MEWS Score 0   0731- RN to pt room for handoff report. Bright red blood noted on pt's sheet. Dried blood noted to right nare. Mouth care performed, dried blood cleaned, and sheets changed.  1000- RN called to room by NT. Pt's right nare with bright red bloody drainage. Ozell Castleman, PA on unit and called to bedside. New orders received for afrin nasal spray.  1115- Micheal, PA at bedside and evaluating right nare. Manual pressure held on pt's nostrils per Ozell PA, with no complications. No drainage noted to right nare.  1415- RN to pt room. Splint to LUE noted to be off, and ace wrap bandage on pt's lap. Ortho tech paged to come evaluate at bedside, and re-apply splint. Ozell, PA notified. 1445- Ortho tech at bedside.  1605- RN to pt room. Pt with right nare epistaxis. Gauze and pressure applied to right nostril. Bright red blood suctioned from pt's mouth. Michael Maczis, PA and Dr. Paola notified. Ashleigh, CN notified.  1625- Dr. Paola at bedside.  1630- No drainage noted to right nostril. Pt's face and mouth cleansed. Gown changed.

## 2024-02-09 NOTE — Progress Notes (Signed)
 Occupational Therapy Treatment Patient Details Name: Charlene Vincent MRN: [id number] DOB: 10/18/1934 Today's Date: 02/09/2024   History of present illness Pt is 89 yo presenting [facility] On 12/11 due to a fall. Pt hit her face with multiple facial fractures present.  L wrist fx. PMH significant for frailty of advanced age, ischemic cardiomyopathy, moderate malnutrition, chronic hyponatremia.   OT comments  Pt supine in bed and agreeable to OT.  She requires mod assist for bed mobility, mod assist to change gown sitting EOB but maintaining sitting balance with supervision. Mod assist to stand/step transfer to recliner. Cueing throughout to avoid weightbearing through LUE (hand/wrist).  Oriented to self and place only. Continue to recommend <3hrs/day inpatient setting at dc. Will follow acutely.       If plan is discharge home, recommend the following:  A lot of help with walking and/or transfers;A lot of help with bathing/dressing/bathroom;Assistance with cooking/housework;Assistance with feeding;Direct supervision/assist for medications management;Direct supervision/assist for financial management;Assist for transportation;Help with stairs or ramp for entrance;Supervision due to cognitive status   Equipment Recommendations  Other (comment) (defer)    Recommendations for Other Services      Precautions / Restrictions Precautions Precautions: Fall Recall of Precautions/Restrictions: Impaired Required Braces or Orthoses: Splint/Cast Splint/Cast: L wrist splint Splint/Cast - Date Prophylactic Dressing Applied (if applicable): 02/09/24 Restrictions Weight Bearing Restrictions Per Provider Order: Yes LUE Weight Bearing Per Provider Order: Weight bear through elbow only Other Position/Activity Restrictions: no WB orders in chart. Maintained NWB L wrist due to fx.       Mobility Bed Mobility Overal bed mobility: Needs Assistance Bed Mobility: Supine to Sit     Supine to sit: Mod  assist, HOB elevated, Used rails     General bed mobility comments: assist to elevate trunk and scoot to EOB    Transfers Overall transfer level: Needs assistance Equipment used: 1 person hand held assist Transfers: Sit to/from Stand, Bed to chair/wheelchair/BSC Sit to Stand: Mod assist     Step pivot transfers: Min assist     General transfer comment: assist to power up and steady, continuous cueing to avoid WB through L UE. pt relying on R UE and external support to recliner     Balance Overall balance assessment: Needs assistance Sitting-balance support: Feet supported, No upper extremity supported Sitting balance-Leahy Scale: Fair     Standing balance support: Single extremity supported, During functional activity Standing balance-Leahy Scale: Poor Standing balance comment: reliant on external support.                           ADL either performed or assessed with clinical judgement   ADL Overall ADL's : Needs assistance/impaired Eating/Feeding: Set up;Supervision/ safety;Sitting   Grooming: Moderate assistance;Sitting                   Toilet Transfer: Moderate assistance Toilet Transfer Details (indicate cue type and reason): stand step to recliner         Functional mobility during ADLs: Moderate assistance;Cueing for safety;Cueing for sequencing      Extremity/Trunk Assessment Upper Extremity Assessment Upper Extremity Assessment: LUE deficits/detail LUE Deficits / Details: L UE splinted from PIPs proximal of elbow LUE Coordination: decreased fine motor;decreased gross motor   Lower Extremity Assessment Lower Extremity Assessment: Defer to PT evaluation        Vision   Additional Comments: continue assessment   Perception     Praxis  Communication Communication Communication: Impaired Factors Affecting Communication: Hearing impaired   Cognition Arousal: Alert Behavior During Therapy: Flat affect Cognition: No  family/caregiver present to determine baseline, Cognition impaired   Orientation impairments: Time, Situation Awareness: Intellectual awareness impaired Memory impairment (select all impairments): Short-term memory, Working civil service fast streamer, Non-declarative long-term memory, Geneticist, Molecular long-term memory Attention impairment (select first level of impairment): Focused attention Executive functioning impairment (select all impairments): Initiation, Organization, Sequencing, Reasoning, Problem solving OT - Cognition Comments: pt oriented to self and hopsital, but no awareness of situation.  she follows simple commands with increased time but poor recall of precautions and safety.  tends to repeat questions.                 Following commands: Impaired Following commands impaired: Follows one step commands with increased time      Cueing   Cueing Techniques: Verbal cues, Gestural cues, Tactile cues  Exercises      Shoulder Instructions       General Comments VSS on RA, cleared by RN to mobilize    Pertinent Vitals/ Pain       Pain Assessment Pain Assessment: Faces Faces Pain Scale: No hurt Pain Intervention(s): Monitored during session, Repositioned  Home Living                                          Prior Functioning/Environment              Frequency  Min 2X/week        Progress Toward Goals  OT Goals(current goals can now be found in the care plan section)  Progress towards OT goals: Progressing toward goals  Acute Rehab OT Goals OT Goal Formulation: Patient unable to participate in goal setting Time For Goal Achievement: 02/18/24 Potential to Achieve Goals: Fair  Plan      Co-evaluation                 AM-PAC OT 6 Clicks Daily Activity     Outcome Measure   Help from another person eating meals?: A Little Help from another person taking care of personal grooming?: A Lot Help from another person toileting, which includes using  toliet, bedpan, or urinal?: Total Help from another person bathing (including washing, rinsing, drying)?: A Lot Help from another person to put on and taking off regular upper body clothing?: A Lot Help from another person to put on and taking off regular lower body clothing?: Total 6 Click Score: 11    End of Session Equipment Utilized During Treatment: Gait belt  OT Visit Diagnosis: Unsteadiness on feet (R26.81);Other symptoms and signs involving cognitive function;History of falling (Z91.81);Muscle weakness (generalized) (M62.81)   Activity Tolerance Patient tolerated treatment well   Patient Left in chair;with call bell/phone within reach;with chair alarm set   Nurse Communication Mobility status;Precautions        Time: 8779-8762 OT Time Calculation (min): 17 min  Charges: OT General Charges $OT Visit: 1 Visit OT Treatments $Self Care/Home Management : 8-22 mins  Etta NOVAK, OT Acute Rehabilitation Services Office [phone] Secure Chat Preferred    Etta GORMAN Hope 02/09/2024, 1:12 PM

## 2024-02-09 NOTE — Progress Notes (Signed)
 Speech Language Pathology Treatment: Dysphagia  Patient Details Name: Charlene Vincent MRN: [id number] DOB: [birth year] Today's Date: 02/09/2024 Time: 8887-8876 SLP Time Calculation (min) (ACUTE ONLY): 11 min  Assessment / Plan / Recommendation Clinical Impression  SLP conducted skilled therapy session targeting swallowing goals. Patient awake and alert upon SLP arrival, agreeable and pleasant. SLP repositioned patient into upright position then provided oral care via suction toothbrush. Patient benefited from mod assist for thoroughness. SLP administered nectar thick liquids and Dys1 solids with no overt s/sx of penetration/aspiration. Switched to thin liquids where patient demonstrated immediate cough response x1 with first administration of thin liquid via teaspoon, however suspect this was d/t miscoordination with faster moving texture, as patient exhibited no further overt symptoms with further thorough trials. Patient benefited from provision of straw as it improved self-monitoring of sip amount and oral control. No anterior spillage noted. Recommend Dys1/thin liquid diet. Administer liquids via straw. Administer medications crushed in puree. Dys1 solids will remain d/t ENT restrictions d/t injury to facial structures. Patient was left in room with call bell in reach and alarm set. SLP will continue to target goals per plan of care.     HPI HPI: Charlene Vincent is an 89 yo presenting 12/11 s/p GLF at home. Work up revealed TBI/B SDH, [place] II fx with R epistaxis, and L distal radial fx. Previous clinical swallow eval in 2023 was [facility]. BSE completed 02/04/24 recommending initiate PO diet of dys 1 (puree) solids, nectar thick liquids. PMH includes: hypothyroidism, Takotsubo CM, prior MI, PNA      SLP Plan  Continue with current plan of care        Swallow Evaluation Recommendations   Recommendations: PO diet PO Diet Recommendation: Dysphagia 1 (Pureed);Thin liquids (Level 0) Liquid  Administration via: Straw Medication Administration: Crushed with puree Supervision: Full supervision/cueing for swallowing strategies Postural changes: Position pt fully upright for meals Oral care recommendations: Oral care BID (2x/day)     Recommendations                     Oral care BID   Frequent or constant Supervision/Assistance Dysphagia, unspecified (R13.10)     Continue with current plan of care     Charlene Vincent  02/09/2024, 1:55 PM

## 2024-02-09 NOTE — Progress Notes (Addendum)
 Initial Nutrition Assessment  DOCUMENTATION CODES:   Underweight, Severe malnutrition in context of chronic illness  INTERVENTION:  Encourage po intake on Dysphagia 1, thin liquids Automatic trays Nursing to assist with meals  Ensure Plus High Protein po BID, each supplement provides 350 kcal and 20 grams of protein  Magic cup TID with meals, each supplement provides 290 kcal and 9 grams of protein 100 mg Thiamine  daily  1,000 units Vitamin D  daily   NUTRITION DIAGNOSIS:   Severe Malnutrition related to chronic illness as evidenced by severe muscle depletion, severe fat depletion.  GOAL:   Patient will meet greater than or equal to 90% of their needs  MONITOR:   PO intake, Supplement acceptance, Diet advancement, Labs, Weight trends, I & O's, Skin  REASON FOR ASSESSMENT:   Other (Comment) (Low BMI)    ASSESSMENT:  89 y.o who presents after a GLF with TBIB SDH, [place] II fxs with right epistaxis, Left distal radial fx, lip laceration, sternal and right tibia/fibula pain. PMH of hypothyroidism, Takotsubo CM, recent falls, and prior MI.  Pt up in chair on assessment, unable to provide reliable history, no family bedside.   Pt appears severely malnourished with severe muscle and fat wasting. Has severe bruising all over her body and face. Lunch on tray table and pt had about 25% of each item including, pureed chicken, mac n cheese, broccoli, and pudding. Pt has no teeth and utilizes dentures. Per ENT, has to be on pureed diet until facial fractures heal. Nursing to assist with meals.   Per weight history, pt has been losing weight throughout the years however, seems relatively stable within the last 8 months. Has lost 25 lbs, 19% in 2 years. Limited weight history available within the last year however, pt has lost 3.5 lbs, 3% in the last 8 months which is not clinically significant but is concerning given deficits found on exam and new trauma.   Pt does meet criteria for severe  malnutrition and with pt's trauma it may be difficult for her to meet her increased nutritional needs for healing. Add ONS to optimize po intake and monitor intake.    Admit weight: 47.4 kg  Current weight: 47.4 kg  Wt Readings from Last 10 Encounters:  02/08/24 47.4 kg  06/12/23 49 kg  01/24/23 49 kg  06/05/22 49.7 kg  01/19/22 49.7 kg  09/30/21 47.6 kg  07/20/21 59 kg  02/01/21 57.6 kg  11/06/20 57.6 kg  11/02/20 57.2 kg   Average Meal Intake: 12/14-12/17: 25-50% intake x 4 recorded meals  Nutritionally Relevant Medications: Scheduled Meds:  cholecalciferol   1,000 Units Oral Daily   feeding supplement  237 mL Oral BID BM   levETIRAcetam   500 mg Oral BID   levothyroxine   112 mcg Oral Q24H   lisinopril   2.5 mg Oral Daily   multivitamin with minerals  1 tablet Oral Daily   thiamine   100 mg Oral Daily   Labs Reviewed: CBG ranges from 96-145 mg/dL over the last 24 hours HgbA1c 5.6  NUTRITION - FOCUSED PHYSICAL EXAM:  Flowsheet Row Most Recent Value  Orbital Region Severe depletion  Upper Arm Region Severe depletion  Thoracic and Lumbar Region Severe depletion  Buccal Region Severe depletion  Temple Region Severe depletion  Clavicle Bone Region Severe depletion  Clavicle and Acromion Bone Region Severe depletion  Scapular Bone Region Severe depletion  Dorsal Hand Severe depletion  Patellar Region Severe depletion  Anterior Thigh Region Severe depletion  Posterior Calf Region  Severe depletion  Edema (RD Assessment) Mild  [Face and lwoer extremtiey swelling from fall]  Hair Reviewed  Eyes Reviewed  Mouth Reviewed  [Uses denutres, no teeth]  Skin Reviewed  [Multiple brusiing over body and face]  Nails Reviewed    Diet Order:   Diet Order             DIET - DYS 1 Room service appropriate? No; Fluid consistency: Thin  Diet effective now                   EDUCATION NEEDS:   Not appropriate for education at this time  Skin:  Skin Assessment: Skin  Integrity Issues: Skin Integrity Issues:: Other (Comment) Other: Multiple bruises/redness over body and face  Last BM:  PTA  Height:   Ht Readings from Last 1 Encounters:  02/08/24 5' 7 (1.702 m)    Weight:   Wt Readings from Last 1 Encounters:  02/08/24 47.4 kg    Ideal Body Weight:  61.4 kg  BMI:  Body mass index is 16.37 kg/m.  Estimated Nutritional Needs:   Kcal:  1400-1600 kcal  Protein:  70-90 gm  Fluid:  >1.4L/day   Olivia Kenning, RD Registered Dietitian  See Amion for more information

## 2024-02-09 NOTE — Progress Notes (Signed)
 Physical Therapy Treatment Patient Details Name: Charlene Vincent MRN: [id number] DOB: August 07, 1934 Today's Date: 02/09/2024   History of Present Illness Pt is 89 yo presenting to [facility] On 12/11 due to a fall. Pt hit her face with multiple facial fractures present.  L wrist fx. PMH significant for frailty of advanced age, ischemic cardiomyopathy, moderate malnutrition, chronic hyponatremia.    PT Comments  Pt tolerates treatment well, progressing gait training at bedside. Pt reports enjoying country music so PT incorporates music and a 4' box step into gait training which pt appears to enjoy. Pt continues to require consistent physical assistance due to instability when standing, remaining at a high risk for falls. Patient will benefit from continued inpatient follow up therapy, <3 hours/day.    If plan is discharge home, recommend the following: A little help with walking and/or transfers;Help with stairs or ramp for entrance;Assistance with cooking/housework;Assist for transportation;Supervision due to cognitive status   Can travel by private vehicle     Yes  Equipment Recommendations  BSC/3in1;Rolling walker (2 wheels)    Recommendations for Other Services       Precautions / Restrictions Precautions Precautions: Fall Recall of Precautions/Restrictions: Impaired Required Braces or Orthoses: Splint/Cast Splint/Cast: L wrist splint Splint/Cast - Date Prophylactic Dressing Applied (if applicable): 02/09/24 Restrictions Weight Bearing Restrictions Per Provider Order: Yes LUE Weight Bearing Per Provider Order: Weight bear through elbow only Other Position/Activity Restrictions: no WB orders in chart. Maintained NWB L wrist due to fx.     Mobility  Bed Mobility Overal bed mobility: Needs Assistance Bed Mobility: Rolling, Sidelying to Sit, Sit to Supine Rolling: Min assist, Used rails Sidelying to sit: Mod assist   Sit to supine: Max assist        Transfers Overall transfer  level: Needs assistance Equipment used: 1 person hand held assist Transfers: Sit to/from Stand Sit to Stand: Min assist                Ambulation/Gait Ambulation/Gait assistance: Mod assist Gait Distance (Feet): 10 Feet (multiple trials of side stepping laterally to right and left at bedside followed by 3 sets of 4' box steps) Assistive device: 1 person hand held assist Gait Pattern/deviations: Step-to pattern, Shuffle, Staggering left Gait velocity: reduced Gait velocity interpretation: <1.31 ft/sec, indicative of household ambulator   General Gait Details: pt with slowed step-to gait, pt tends to stagger and lose balance more when stepping laterally to L and posteriorly   Stairs             Wheelchair Mobility     Tilt Bed    Modified Rankin (Stroke Patients Only)       Balance Overall balance assessment: Needs assistance Sitting-balance support: No upper extremity supported, Feet supported Sitting balance-Leahy Scale: Fair     Standing balance support: Single extremity supported, Reliant on assistive device for balance Standing balance-Leahy Scale: Poor                              Communication Communication Communication: Impaired Factors Affecting Communication: Hearing impaired  Cognition Arousal: Alert Behavior During Therapy: WFL for tasks assessed/performed   PT - Cognitive impairments: No family/caregiver present to determine baseline, Memory, Awareness, Sequencing, Safety/Judgement   Orientation impairments: Time, Situation                     Following commands: Impaired Following commands impaired: Follows one step commands with increased time  Cueing Cueing Techniques: Verbal cues, Gestural cues, Tactile cues  Exercises      General Comments General comments (skin integrity, edema, etc.): VSS on RA      Pertinent Vitals/Pain Pain Assessment Pain Assessment: No/denies pain    Home Living                           Prior Function            PT Goals (current goals can now be found in the care plan section) Acute Rehab PT Goals Patient Stated Goal: to reduce falls risk Progress towards PT goals: Progressing toward goals    Frequency    Min 2X/week      PT Plan      Co-evaluation              AM-PAC PT 6 Clicks Mobility   Outcome Measure  Help needed turning from your back to your side while in a flat bed without using bedrails?: A Little Help needed moving from lying on your back to sitting on the side of a flat bed without using bedrails?: A Lot Help needed moving to and from a bed to a chair (including a wheelchair)?: A Little Help needed standing up from a chair using your arms (e.g., wheelchair or bedside chair)?: A Little Help needed to walk in hospital room?: Total Help needed climbing 3-5 steps with a railing? : Total 6 Click Score: 13    End of Session Equipment Utilized During Treatment: Gait belt Activity Tolerance: Patient tolerated treatment well Patient left: in bed;with call bell/phone within reach;with bed alarm set Nurse Communication: Mobility status (mitts on upon PT departure) PT Visit Diagnosis: Unsteadiness on feet (R26.81);Other abnormalities of gait and mobility (R26.89);History of falling (Z91.81);Muscle weakness (generalized) (M62.81)     Time: 8480-8454 PT Time Calculation (min) (ACUTE ONLY): 26 min  Charges:    $Gait Training: 8-22 mins $Therapeutic Activity: 8-22 mins PT General Charges $$ ACUTE PT VISIT: 1 Visit                     Bernardino JINNY Ruth, PT, DPT Acute Rehabilitation Office [phone]    Bernardino JINNY Ruth 02/09/2024, 4:03 PM

## 2024-02-09 NOTE — Progress Notes (Signed)
 I discussed plan with ENT.   Patient re-examined. Patient with continued epistaxis despite packing.   Airway intact and protected. On RA. No sob. Lungs clears. HDS. Hgb stable this AM.   There was some blood in the oropharanx that was suctioned. Using a tongue depressor, I examined the back of the throat. No active drainage down posterior pharynx. R nare packing removed. Nare was examined with otoscope. There was some active bleeding that appeared to be under a large clot. The clot was gently removed to ensure I did not cause further or worsening bleeding. With otoscope I was able to see an area along the anterior, medial (on septum), inferior aspect of the nose with active oozing.   Afrin spray x 2 applied to R nare. A rolled gauze was inserted in the nose. Patient was leaned forward and direct pressure was held for 10 minutes. After which, packing was removed and direct visualization with otoscope confirmed that prior area of bleeding had stopped and there was no further bleeding at this time.   Patient instructed no nose blowing and to avoid any direct finger trauma to the area.   Charlene Vincent , [facility] Surgery 02/09/2024, 11:27 AM Please see Amion for pager number during day hours 7:00am-4:30pm

## 2024-02-09 NOTE — TOC Progression Note (Signed)
 Transition of Care [facility]) - Progression Note    Patient Details  Name: Charlene Vincent MRN: [id number] Date of Birth: 01/02/1935  Transition of Care [facility]) CM/SW Contact  Antigone Crowell E Jayleene Glaeser, LCSW Phone Number: 02/09/2024, 3:11 PM  Clinical Narrative:    Shara is still pending for [facility].                    Expected Discharge Plan and Services                                               Social Drivers of Health (SDOH) Interventions SDOH Screenings   Food Insecurity: Patient Unable To Answer (02/04/2024)  Housing: Unknown (02/04/2024)  Transportation Needs: Patient Unable To Answer (02/04/2024)  Utilities: Patient Unable To Answer (02/04/2024)  Social Connections: Patient Unable To Answer (02/04/2024)  Tobacco Use: Medium Risk (01/27/2024)   Received from [facility]    Readmission Risk Interventions     No data to display

## 2024-02-09 NOTE — Progress Notes (Signed)
 VAST consult received to obtain PIV access. Pt currently has no IV meds/fluids or studies ordered and vital signs stable. Reached out to unit nurse to inquire why an IV is needed at this time. Education provided to nurse regarding hospital policy and best practice to place IV access only when needed to allow for vein preservation and reduce the risk of infection from unneccessary invasive procedures and idle lines. Pt's nurse further educated if pt's condition changes and IV access is needed emergently, IV team consult should be placed STAT with a comment as to why an emergent IV is needed.

## 2024-02-09 NOTE — Progress Notes (Signed)
 Orthopedic Tech Progress Note Patient Details:  Charlene Vincent 12-27-1934 [id number]  Ortho Devices Type of Ortho Device: Short arm splint Ortho Device/Splint Location: LUE Ortho Device/Splint Interventions: Ordered, Application, Adjustment   Post Interventions Patient Tolerated: Well Instructions Provided: Adjustment of device, Care of device  [name] 02/09/2024, 10:40 AM

## 2024-02-10 LAB — BASIC METABOLIC PANEL WITH GFR
Anion gap: 7 (ref 5–15)
BUN: 46 mg/dL — ABNORMAL HIGH (ref 8–23)
CO2: 26 mmol/L (ref 22–32)
Calcium: 8.2 mg/dL — ABNORMAL LOW (ref 8.9–10.3)
Chloride: 111 mmol/L (ref 98–111)
Creatinine, Ser: 0.79 mg/dL (ref 0.44–1.00)
GFR, Estimated: >60 mL/min (ref >60)
Glucose, Bld: 145 mg/dL — ABNORMAL HIGH (ref 70–99)
Potassium: 4.6 mmol/L (ref 3.5–5.1)
Sodium: 144 mmol/L (ref 135–145)

## 2024-02-10 LAB — CBC
HCT: 26.9 % — ABNORMAL LOW (ref 36.0–46.0)
HCT: 27.1 % — ABNORMAL LOW (ref 36.0–46.0)
Hemoglobin: 8.6 g/dL — ABNORMAL LOW (ref 12.0–15.0)
Hemoglobin: 8.6 g/dL — ABNORMAL LOW (ref 12.0–15.0)
MCH: 30.8 pg (ref 26.0–34.0)
MCH: 31 pg (ref 26.0–34.0)
MCHC: 32 g/dL (ref 30.0–36.0)
MCHC: 31.7 g/dL (ref 30.0–36.0)
MCV: 96.4 fL (ref 80.0–100.0)
MCV: 97.8 fL (ref 80.0–100.0)
Platelets: 221 K/uL (ref 150–400)
Platelets: 240 K/uL (ref 150–400)
RBC: 2.79 MIL/uL — ABNORMAL LOW (ref 3.87–5.11)
RBC: 2.77 MIL/uL — ABNORMAL LOW (ref 3.87–5.11)
RDW: 14.7 % (ref 11.5–15.5)
RDW: 14.9 % (ref 11.5–15.5)
WBC: 10.6 K/uL — ABNORMAL HIGH (ref 4.0–10.5)
WBC: 11.4 K/uL — ABNORMAL HIGH (ref 4.0–10.5)
nRBC: 0 % (ref 0.0–0.2)
nRBC: 0.2 % (ref 0.0–0.2)

## 2024-02-10 MED ORDER — BISACODYL 5 MG PO TBEC
5.0000 mg | DELAYED_RELEASE_TABLET | Freq: Once | ORAL | Status: AC
  Administered 2024-02-10: 09:00:00 5 mg via ORAL
  Filled 2024-02-10: qty 1

## 2024-02-10 MED ORDER — BISACODYL 10 MG RE SUPP
10.0000 mg | Freq: Once | RECTAL | Status: AC
  Administered 2024-02-10: 10:00:00 10 mg via RECTAL
  Filled 2024-02-10: qty 1

## 2024-02-10 MED ORDER — IPRATROPIUM-ALBUTEROL 0.5-2.5 (3) MG/3ML IN SOLN: 3.0000 mL | Freq: Four times a day (QID) | RESPIRATORY_TRACT | Status: DC | PRN

## 2024-02-10 MED ORDER — GUAIFENESIN ER 600 MG PO TB12
600.0000 mg | ORAL_TABLET | Freq: Two times a day (BID) | ORAL | Status: DC
  Administered 2024-02-10 – 2024-02-11 (×2): 600 mg via ORAL
  Filled 2024-02-10 (×3): qty 1

## 2024-02-10 MED ORDER — SODIUM CHLORIDE 0.9 % IV BOLUS
1000.0000 mL | INTRAVENOUS | Status: DC | PRN
  Administered 2024-02-10: 17:00:00 1000 mL via INTRAVENOUS

## 2024-02-10 NOTE — Progress Notes (Signed)
 Speech Language Pathology Treatment: Dysphagia  Patient Details Name: Charlene Vincent MRN: [id number] DOB: May 16, 1934 Today's Date: 02/10/2024 Time: 0955-1006 SLP Time Calculation (min) (ACUTE ONLY): 11 min  Assessment / Plan / Recommendation Clinical Impression  SLP conducted skilled therapy session targeting swallowing goals. Patient awake and alert upon SLP arrival, agreeable to all tasks and amenable to repositioning upright for swallow safety. Patient endorses having no difficulty with breakfast of oatmeal, though poor historian. SLP provided total assist for oral care via suction toothbrush, then administered thin liquids and Dys 1 solids. Thin liquids administered via straw to assist with oral containment. Patient exhibited no overt s/sx of penetration/aspiration across thorough trials. Patient is currently restricted to Dys1 solids per ENT request due to facial trauma, but suspect patient would have difficulty with anything more than Dys1 due to edentulous state and oral weakness/edema. Recommend continuation of Dys1/thin liquids. No further SLP needs indicated for swallowing function unless MD indicates ability to upgrade solids. SLP will remain on case to target cognitive goals. Patient was left in room with call bell in reach and alarm set. SLP will continue to target goals per plan of care.     HPI HPI: [name] is an 89 yo presenting 12/11 s/p GLF at home. Work up revealed TBI/B SDH, [place] II fx with R epistaxis, and L distal radial fx. Previous clinical swallow eval in 2023 was [facility]. BSE completed 02/04/24 recommending initiate PO diet of dys 1 (puree) solids, nectar thick liquids. PMH includes: hypothyroidism, Takotsubo CM, prior MI, PNA      SLP Plan  Continue with current plan of care        Swallow Evaluation Recommendations   Recommendations: PO diet PO Diet Recommendation: Dysphagia 1 (Pureed);Thin liquids (Level 0) Liquid Administration via: Straw Medication  Administration: Crushed with puree Supervision: Full assist for feeding Postural changes: Position pt fully upright for meals Oral care recommendations: Oral care BID (2x/day)     Recommendations                     Oral care BID   Frequent or constant Supervision/Assistance Dysphagia, unspecified (R13.10)     Continue with current plan of care     Charlene Vincent  02/10/2024, 12:20 PM

## 2024-02-10 NOTE — TOC Progression Note (Signed)
 Transition of Care [facility]) - Progression Note    Patient Details  Name: RANDI POULLARD MRN: [id number] Date of Birth: December 12, 1934  [facility]) CM/SW Contact  Tevon Berhane E Jaedin Trumbo, LCSW Phone Number: 02/10/2024, 9:22 AM  Clinical Narrative:    CSW reached out to [place] at [place] and requested update on insurance auth.                     Expected Discharge Plan and Services                                               Social Drivers of Health (SDOH) Interventions SDOH Screenings   Food Insecurity: Patient Unable To Answer (02/04/2024)  Housing: Unknown (02/04/2024)  Transportation Needs: Patient Unable To Answer (02/04/2024)  Utilities: Patient Unable To Answer (02/04/2024)  Social Connections: Patient Unable To Answer (02/04/2024)  Tobacco Use: Medium Risk (01/27/2024)   Received from [facility]    Readmission Risk Interventions     No data to display

## 2024-02-10 NOTE — Progress Notes (Signed)
°   02/10/24 1558  Vitals  BP (!) 78/54  MAP (mmHg) (!) 63  Pulse Rate 93  ECG Heart Rate 93  Resp (!) 22  MEWS COLOR  MEWS Score Color Yellow  Oxygen Therapy  SpO2 (!) 86 %  MEWS Score  MEWS Temp 0  MEWS Systolic 2  MEWS Pulse 0  MEWS RR 1  MEWS LOC 0  MEWS Score 3   RN notified The trauma PA and trauma MD about low BP . See MAR for new orders

## 2024-02-10 NOTE — Progress Notes (Addendum)
 Subjective: CC: Seen with RN. No further epistaxis.  Tachycardic and hypotensive overnight - now improved after 2L bolus of IVF. Weaned back to RA. Afebrile without tachycardia or hypotension on last vitals. On RA. Hgb 8.5 from 10.9.   Denies complaints currently. Pain well controlled. Tolerating po. No n/v. Voiding per report - confirmed with RN good UOP given nothing documented over the last 12 hours - they will update I/O. Cr wnl. No BM.   Working with therapies.  Objective: Vital signs in last 24 hours: Temp:  [97.4 F (36.3 C)-98.7 F (37.1 C)] 98.7 F (37.1 C) (12/18 0742) Pulse Rate:  [80-100] 85 (12/18 0742) Resp:  [12-20] 19 (12/18 0742) BP: (73-129)/(47-91) 114/69 (12/18 0742) SpO2:  [87 %-98 %] 91 % (12/18 0742) FiO2 (%):  [40 %] 40 % (12/17 2357) Last BM Date :  (PTA)  Intake/Output from previous day: 12/17 0701 - 12/18 0700 In: 100 [P.O.:100] Out: 600 [Urine:600] Intake/Output this shift: No intake/output data recorded.  PE: Gen:  Alert, NAD, pleasant HEENT: No epistaxis present. Facial bruising. Card:  Reg Pulm:  Some bilateral rhonchi. No wheezes or rales.  Abd: Soft, ND, NT Ext: L wrist splint not in place. No LE edema. Ext x 4 wwp  Lab Results:  Recent Labs    02/08/24 0149 02/10/24 0321  WBC 10.1 10.6*  HGB 10.9* 8.6*  HCT 33.4* 26.9*  PLT 168 221   BMET Recent Labs    02/08/24 0149 02/10/24 0321  NA 141 144  K 4.0 4.6  CL 109 111  CO2 22 26  GLUCOSE 107* 145*  BUN 18 46*  CREATININE 0.60 0.79  CALCIUM  8.7* 8.2*   PT/INR No results for input(s): LABPROT, INR in the last 72 hours. CMP     Component Value Date/Time   NA 144 02/10/2024 0321   NA 143 12/28/2017 1538   K 4.6 02/10/2024 0321   CL 111 02/10/2024 0321   CO2 26 02/10/2024 0321   GLUCOSE 145 (H) 02/10/2024 0321   BUN 46 (H) 02/10/2024 0321   BUN 11 12/28/2017 1538   CREATININE 0.79 02/10/2024 0321   CREATININE 0.60 03/14/2015 1514   CALCIUM  8.2 (L)  02/10/2024 0321   PROT 6.4 (L) 06/12/2023 0848   ALBUMIN 3.4 (L) 06/12/2023 0848   AST 30 06/12/2023 0848   ALT 25 06/12/2023 0848   ALKPHOS 74 06/12/2023 0848   BILITOT 0.6 06/12/2023 0848   GFRNONAA >60 02/10/2024 0321   GFRAA 85 12/28/2017 1538   Lipase     Component Value Date/Time   LIPASE 59 (H) 06/12/2023 0848    Studies/Results: No results found.  Anti-infectives: Anti-infectives (From admission, onward)    Start     Dose/Rate Route Frequency Ordered Stop   02/03/24 0900  clindamycin  (CLEOCIN ) IVPB 900 mg        900 mg 100 mL/hr over 30 Minutes Intravenous  Once 02/03/24 0855 02/03/24 1038        Assessment/Plan GLF TBI/B SDH - Per NSGY, Dr. Lanis, [facility] in 8 hours was improving - SDH no longer visualized, Keppra , neuro checks, TBI therapies.  [name] w/ R epistaxis - Per ENT, Dr. Roark; tentative plan for non-op management, appreciate their recs.; f/u 1-2 weeks, D1 diet. Recurrent epistaxis 12/17 - now controlled.  ABL anemia - hgb 8.6 from 10.9 likely 2/2 above. Recheck in AM. HDS currently. L distal radial fx - Per Hand surgery, Dr. Lorretta, splint, non-op,  follow up as outpatient Lip laceration - repaired 12/11 with absorbable sutures Sternal pain - EKG normal sinus. R tib/fib pain - xray neg Hx Hypothyroidism - home meds Hx Takotsubo's cardiomyopathy  Hx chronic pain - on Percocet 2.5-5mg  prn at baseline  Confusion/hospital delirium - delirium precautions, monitor Pulm - On RA. Rhonchi on exam. Nebs prn. Mucinex . Monitor. Plan CXR if requires o2 again.  CV - Required 2L bolus overnight. Suspect 2/2 ABL anemia. Monitor hgb. On tele. HDS currently - tachycardia and hypotension resolved. Can hold BP meds if needed.   FEN - Dys 1 ENT recs. Increase bowel regimen - suppository VTE - SCDs, LMWH ID - None currently Foley - TOV 12/14, voiding with purewick now Dispo - 4NP, therapies, plan SNF. Auth pending for [facility].   I reviewed nursing  notes, last 24 h vitals and pain scores, last 48 h intake and output, last 24 h labs and trends, and last 24 h imaging results.    LOS: 7 days    Charlene Vincent, [facility] Surgery 02/10/2024, 8:34 AM Please see Amion for pager number during day hours 7:00am-4:30pm

## 2024-02-10 NOTE — Plan of Care (Signed)

## 2024-02-10 NOTE — Progress Notes (Addendum)
 Trauma Event Note     Received phone call from primary RN - pt's BP soft, systolic in 70s, pt is mentating normally, BP cuff appropriate size, readjusted-- repeat BP 86/75 MAP  81. P - 90.  Pt is to be discharged to [facility] this afternoon-  NS 1 L started as a bolus at 500cc/hr per VO Dr. Almer Shaper PA   [phone] -- This TRN spoke with Trauma PA- Ozell Shaper, PA- Orders for :  Hold discharge until tomorrow Keep fluids running at 500cc/hr until systolic BP > 90 then drop it to 125cc /hr.  Hold Coreg  tonight.     Last imported Vital Signs BP (!) 78/44 (BP Location: Right Wrist)   Pulse 95   Temp 97.8 F (36.6 C) (Oral)   Resp (!) 24   Ht 5' 7 (1.702 m)   Wt 104 lb 8 oz (47.4 kg)   SpO2 (!) 89%   BMI 16.37 kg/m   Trending CBC Recent Labs    02/08/24 0149 02/10/24 0321 02/10/24 1219  WBC 10.1 10.6* 11.4*  HGB 10.9* 8.6* 8.6*  HCT 33.4* 26.9* 27.1*  PLT 168 221 240    Trending Coag's No results for input(s): APTT, INR in the last 72 hours.  Trending BMET Recent Labs    02/08/24 0149 02/10/24 0321  NA 141 144  K 4.0 4.6  CL 109 111  CO2 22 26  BUN 18 46*  CREATININE 0.60 0.79  GLUCOSE 107* 145*      Zahriyah Joo M Ranelle Auker  Trauma Response RN  Please call TRN at [phone] for further assistance.

## 2024-02-10 NOTE — TOC Transition Note (Addendum)
 Transition of Care [facility]) - Discharge Note   Patient Details  Name: Charlene Vincent MRN: [id number] Date of Birth: Jul 15, 1934  Transition of Care [facility]) CM/SW Contact:  Devora Tortorella E Providencia Hottenstein, LCSW Phone Number: 02/10/2024, 11:44 AM   Clinical Narrative:    Shara is approved for [facility] Auth ID # [id number] valid 12/17-12/21/25.  Discharge to [facility] today. Room 212B pending CBC results Confirmed with Admissions Worker Logan.  Updated MD, RN, and son [name]. Asked RN to call report. EMS paperwork completed. Will call for PTAR when DC confirmed (pending CBC results per PA)  4:35- Discharge delayed to tomorrow per Leontine at [facility] as their pharmacy closed at 430, awaiting PA to see for blood pressure and DC Summary. Updated Care Team and patient's son.   Final next level of care: Skilled Nursing Facility Barriers to Discharge: Barriers Resolved   Patient Goals and CMS Choice   CMS Medicare.gov Compare Post Acute Care list provided to:: Patient Represenative (must comment) Choice offered to / list presented to : Adult Children      Discharge Placement              Patient [facility] bed at: [facility] Patient to be transferred to facility by: PTAR Name of family member notified: [name] Patient and family notified of of transfer: 02/10/24  Discharge Plan and Services Additional resources added to the After Visit Summary for                                       Social Drivers of Health (SDOH) Interventions SDOH Screenings   Food Insecurity: Patient Unable To Answer (02/04/2024)  Housing: Unknown (02/04/2024)  Transportation Needs: Patient Unable To Answer (02/04/2024)  Utilities: Patient Unable To Answer (02/04/2024)  Social Connections: Patient Unable To Answer (02/04/2024)  Tobacco Use: Medium Risk (01/27/2024)   Received from [facility]     Readmission Risk Interventions     No data to display

## 2024-02-11 LAB — CBC
HCT: 30.6 % — ABNORMAL LOW (ref 36.0–46.0)
Hemoglobin: 9.4 g/dL — ABNORMAL LOW (ref 12.0–15.0)
MCH: 31.3 pg (ref 26.0–34.0)
MCHC: 30.7 g/dL (ref 30.0–36.0)
MCV: 102 fL — ABNORMAL HIGH (ref 80.0–100.0)
Platelets: 214 K/uL (ref 150–400)
RBC: 3 MIL/uL — ABNORMAL LOW (ref 3.87–5.11)
RDW: 15.3 % (ref 11.5–15.5)
WBC: 15.7 K/uL — ABNORMAL HIGH (ref 4.0–10.5)
nRBC: 0.1 % (ref 0.0–0.2)

## 2024-02-11 MED ORDER — VITAMIN D3 25 MCG PO TABS: 1000.0000 [IU] | ORAL_TABLET | Freq: Every day | ORAL | Status: AC

## 2024-02-11 MED ORDER — VITAMIN B-1 100 MG PO TABS: 100.0000 mg | ORAL_TABLET | Freq: Every day | ORAL | Status: AC

## 2024-02-11 MED ORDER — MELATONIN 3 MG PO TABS: 3.0000 mg | ORAL_TABLET | Freq: Every evening | ORAL | Status: AC | PRN

## 2024-02-11 MED ORDER — TAMSULOSIN HCL 0.4 MG PO CAPS: 0.4000 mg | ORAL_CAPSULE | Freq: Every day | ORAL | Status: AC

## 2024-02-11 MED ORDER — ADULT MULTIVITAMIN W/MINERALS CH: 1.0000 | ORAL_TABLET | Freq: Every day | ORAL | Status: AC

## 2024-02-11 MED ORDER — GUAIFENESIN ER 600 MG PO TB12: 600.0000 mg | ORAL_TABLET | Freq: Two times a day (BID) | ORAL | Status: AC | PRN

## 2024-02-11 NOTE — Progress Notes (Signed)
 Physical Therapy Treatment Patient Details Name: Charlene Vincent MRN: [id number] DOB: 1934/05/16 Today's Date: 02/11/2024   History of Present Illness Pt is 89 yo presenting to [facility] On 12/11 due to a fall. Pt hit her face with multiple facial fractures present.  L wrist fx. PMH significant for frailty of advanced age, ischemic cardiomyopathy, moderate malnutrition, chronic hyponatremia.    PT Comments  Pt received in bed. More lethargic today requiring continual cues to stay awake and participate. Fatigued quickly. VSS on RA. Mod assist bed mobility, and min assist sit<>stand x 2 trials. Unable to progress gait due to fatigue and bowel incontinence. Pt returned to bed for pericare.      If plan is discharge home, recommend the following: Help with stairs or ramp for entrance;Assistance with cooking/housework;Assist for transportation;Supervision due to cognitive status;A lot of help with walking and/or transfers;A lot of help with bathing/dressing/bathroom   Can travel by private vehicle     Yes  Equipment Recommendations  BSC/3in1;Rolling walker (2 wheels)    Recommendations for Other Services       Precautions / Restrictions Precautions Precautions: Fall Recall of Precautions/Restrictions: Impaired Required Braces or Orthoses: Splint/Cast Splint/Cast: L wrist splint Restrictions LUE Weight Bearing Per Provider Order: Weight bear through elbow only Other Position/Activity Restrictions: no WB orders in chart. Maintained NWB L wrist due to fx.     Mobility  Bed Mobility Overal bed mobility: Needs Assistance Bed Mobility: Rolling, Supine to Sit, Sit to Supine Rolling: Min assist, Used rails   Supine to sit: Mod assist, HOB elevated, Used rails Sit to supine: Mod assist        Transfers Overall transfer level: Needs assistance Equipment used: 1 person hand held assist Transfers: Sit to/from Stand Sit to Stand: Min assist           General transfer comment: STS x  2 trials. Bowel incontinence noted on 2nd trial requiring return to bed for clean up. Fatigue noted after mobility.    Ambulation/Gait                   Stairs             Wheelchair Mobility     Tilt Bed    Modified Rankin (Stroke Patients Only)       Balance Overall balance assessment: Needs assistance Sitting-balance support: No upper extremity supported, Feet supported Sitting balance-Leahy Scale: Fair     Standing balance support: Single extremity supported Standing balance-Leahy Scale: Poor Standing balance comment: reliant on external support.                            Communication Communication Communication: Impaired Factors Affecting Communication: Hearing impaired  Cognition Arousal: Lethargic Behavior During Therapy: WFL for tasks assessed/performed   PT - Cognitive impairments: No family/caregiver present to determine baseline, Memory, Awareness, Sequencing, Safety/Judgement, Orientation, Problem solving, Attention   Orientation impairments: Time, Situation, Place                     Following commands: Impaired Following commands impaired: Follows one step commands with increased time    Cueing Cueing Techniques: Verbal cues, Gestural cues, Tactile cues  Exercises      General Comments General comments (skin integrity, edema, etc.): VSS on RA      Pertinent Vitals/Pain Pain Assessment Pain Assessment: Faces Faces Pain Scale: No hurt    Home Living  Prior Function            PT Goals (current goals can now be found in the care plan section) Acute Rehab PT Goals Patient Stated Goal: home Progress towards PT goals: Progressing toward goals    Frequency    Min 2X/week      PT Plan      Co-evaluation              AM-PAC PT 6 Clicks Mobility   Outcome Measure  Help needed turning from your back to your side while in a flat bed without using  bedrails?: A Little Help needed moving from lying on your back to sitting on the side of a flat bed without using bedrails?: A Lot Help needed moving to and from a bed to a chair (including a wheelchair)?: A Lot Help needed standing up from a chair using your arms (e.g., wheelchair or bedside chair)?: A Little Help needed to walk in hospital room?: Total Help needed climbing 3-5 steps with a railing? : Total 6 Click Score: 12    End of Session Equipment Utilized During Treatment: Gait belt Activity Tolerance: Patient tolerated treatment well Patient left: in bed;with call bell/phone within reach;with bed alarm set Nurse Communication: Mobility status PT Visit Diagnosis: Unsteadiness on feet (R26.81);Other abnormalities of gait and mobility (R26.89);History of falling (Z91.81);Muscle weakness (generalized) (M62.81)     Time: 9144-9080 PT Time Calculation (min) (ACUTE ONLY): 24 min  Charges:    $Therapeutic Activity: 23-37 mins PT General Charges $$ ACUTE PT VISIT: 1 Visit                     Sari MATSU., PT  Office # [phone]    Erven Sari Shaker 02/11/2024, 9:26 AM

## 2024-02-11 NOTE — Progress Notes (Signed)
 Order to discharge patient to [facility] . Discharge instructions/AVS given to PTAR. 1 PIV removed by the RN. Report given to Animator at [facility]. Son was called with update of patient being discharged to SNF. Personal belongings sent  with the patient. Home via PTAR.

## 2024-02-11 NOTE — Discharge Summary (Signed)
 "   Patient ID: Charlene Vincent [id number] 09-May-1934 89 y.o.  Admit date: 02/03/2024 Discharge date: 02/11/2024  Admitting Diagnosis: GLF TBI/B SDH [name] w/ R epistaxis L distal radial fx Sternal pain R tib/fib pain Hx Hypothyroidism Hx Takotsubo's cardiomyopathy  Hx chronic pain  Discharge Diagnosis GLF TBI/B SDH [place] II fracture w/ R epistaxis  ABL anemia  L distal radial fx  Lip laceration  Hx Hypothyroidism  Hx Takotsubo's cardiomyopathy  Hx chronic pain  Confusion/hospital delirium   Consultants ENT  HPI: Charlene Vincent is a 89 y.o. female with a hx of hypothyroidism, Takotsubo CM, and prior MI who presented after a GLF. Patient's son, Toribio, at bedside who helps provide history. EDP also at bedside. Patient reported to initially be more orientated but now is only orientated to self and place, not time or situation. She does not currently remember falling but previously reported walking with a walker this AM at home and thinks that she either got caught on a rug or hung up on her walker and fell. + head trauma. Unclear LOC. She was found by son. Per notes, she has a hx of falls recently. She currently complains of HA, facial pain, sternal pain, and left wrist pain. No visual changes, neck pain, new back pain (reports chronic back pain), abdominal pain or other extremity pain.    Patient is not on any blood thinners. Allergy to ASA, Nystatin  and PCN. Son reports patient does not use alcohol, tobacco, or illicit drugs. He does report she is on Percocet 5mg  prn at baseline for pain from arthritis. Prior abdominal hysterectomy and inguinal hernia repair.  She lives at home with her son and son's friend at baseline in a mobile home and ambulates with a RW.    In the ED she has been HDS without tachycardia or hypotension on RA. WBC 12. Cr wnl. Na 129. EKG Sinus. Imaging with B SDH, facial fx, and L wrist fx. EDP has contacted NSGY, Hand surgery and ENT.  Trauma surgery was asked to see.    She is about to get I/O as she reports she needs to void but has been unable to here.   Procedures Larraine Christen, PA-C  Laceration Repair   Hospital Course:  Patient presented as above after a ground-level fall.  She was found to have below injuries.  Please see below for hospital course.  TBI/B SDH - Per NSGY, Dr. Lanis, [facility] in 8 hours was improving - SDH no longer visualized, tx with Keppra . Worked with TBI therapies.   [name] w/ R epistaxis - Per ENT, Dr. Roark; tentative plan for non-op management, appreciate their recs.; f/u 1-2 weeks, D1 diet. Recurrent epistaxis 12/17 - now controlled.   ABL anemia - 2/2 above. BP meds held in setting of hypotension. Hgb stabilized and HDS at time of d/c.   L distal radial fx - Per Hand surgery, Dr. Lorretta, splint, non-op, follow up as outpatient  Lip laceration - repaired 12/11 with absorbable sutures  Patient worked with therapies during admission and was recommended for SNF. On 12/19 she was felt stable for d/c to SNF.   I did not directly see patient on day of discharge. Please see MD's progress note for more details and physical exam.    Allergies as of 02/11/2024       Reactions   Aspirin  Other (See Comments)   But taking low dose therapy without problems.   Nystatin  Itching  She said when she used the cream it made symptoms worse    Penicillins Rash        Medication List     PAUSE taking these medications    carvedilol  3.125 MG tablet Wait to take this until your doctor or other care provider tells you to start again. Commonly known as: COREG  Take 1 tablet (3.125 mg total) by mouth 2 (two) times daily with a meal.       STOP taking these medications    acetaminophen  500 MG tablet Commonly known as: TYLENOL        TAKE these medications    atorvastatin  80 MG tablet Commonly known as: LIPITOR  Take 80 mg by mouth daily.   docusate sodium  100 MG  capsule Commonly known as: COLACE Take 1 capsule (100 mg total) by mouth 2 (two) times daily.   ferrous sulfate  325 (65 FE) MG EC tablet Take 1 tablet by mouth 2 (two) times daily.   fluticasone  50 MCG/ACT nasal spray Commonly known as: FLONASE  Place 1-2 sprays into both nostrils in the morning and at bedtime.   guaiFENesin  600 MG 12 hr tablet Commonly known as: MUCINEX  Take 1 tablet (600 mg total) by mouth 2 (two) times daily as needed for cough or to loosen phlegm.   levothyroxine  112 MCG tablet Commonly known as: SYNTHROID  Take 1 tablet (112 mcg total) by mouth daily. What changed: when to take this   melatonin 3 MG Tabs tablet Take 1 tablet (3 mg total) by mouth at bedtime as needed.   multivitamin with minerals Tabs tablet Take 1 tablet by mouth daily. Start taking on: February 12, 2024   oxyCODONE -acetaminophen  5-325 MG tablet Commonly known as: PERCOCET/ROXICET Take 0.5-1 tablets by mouth as needed for moderate pain.   pantoprazole  20 MG tablet Commonly known as: Protonix  Take 1 tablet (20 mg total) by mouth daily. What changed: when to take this   polyethylene glycol 17 g packet Commonly known as: MIRALAX  / GLYCOLAX  Take 17 g by mouth daily. What changed:  when to take this reasons to take this   tamsulosin  0.4 MG Caps capsule Commonly known as: FLOMAX  Take 1 capsule (0.4 mg total) by mouth daily. Start taking on: February 12, 2024   thiamine  100 MG tablet Commonly known as: Vitamin B-1 Take 1 tablet (100 mg total) by mouth daily. Start taking on: February 12, 2024   vitamin D3 25 MCG tablet Commonly known as: CHOLECALCIFEROL  Take 1 tablet (1,000 Units total) by mouth daily. Start taking on: February 12, 2024          Contact information for follow-up providers     Debrah Josette ORN., PA-C Follow up.   Specialty: Family Medicine Contact information: [address] [place] [address] [phone]         Roark Rush, MD Follow  up.   Specialty: Otolaryngology Why: For follow up of your facial fractures Contact information: [address] [place] [address] [phone]         [facility] Follow up.   Why: For follow up of your wrist fracture Contact information: [address] [place] [address] [phone]              Contact information for after-discharge care     Destination     [facility] .   Service: Skilled Nursing Contact information: [address] [place]  [phone]  Signed: Ozell CHRISTELLA Shaper, [facility] Surgery 02/11/2024, 12:18 PM Please see Amion for pager number during day hours 7:00am-4:30pm  "

## 2024-02-11 NOTE — Progress Notes (Signed)
 "  Trauma/Critical Care Follow Up Note  Subjective:    Overnight Issues:   Objective:  Vital signs for last 24 hours: Temp:  [97.8 F (36.6 C)-98.6 F (37 C)] 98.3 F (36.8 C) (12/19 0737) Pulse Rate:  [84-98] 91 (12/19 0737) Resp:  [16-24] 19 (12/19 0737) BP: (78-125)/(44-82) 125/66 (12/19 0737) SpO2:  [86 %-96 %] 95 % (12/19 0737)  Intake/Output from previous day: 12/18 0701 - 12/19 0700 In: 120 [P.O.:120] Out: 750 [Urine:750]  Intake/Output this shift: No intake/output data recorded.  Vent settings for last 24 hours:    Physical Exam:  Gen: comfortable, no distress Neuro: follows commands, alert, communicative HEENT: PERRL Neck: supple CV: RRR Pulm: unlabored breathing on RA Abd: soft, NT  , +BM GU: urine clear and yellow, +spontaneous voids Extr: wwp, no edema  Results for orders placed or performed during the hospital encounter of 02/03/24 (from the past 24 hours)  CBC     Status: Abnormal   Collection Time: 02/10/24 12:19 PM  Result Value Ref Range   WBC 11.4 (H) 4.0 - 10.5 K/uL   RBC 2.77 (L) 3.87 - 5.11 MIL/uL   Hemoglobin 8.6 (L) 12.0 - 15.0 g/dL   HCT 72.8 (L) 63.9 - 53.9 %   MCV 97.8 80.0 - 100.0 fL   MCH 31.0 26.0 - 34.0 pg   MCHC 31.7 30.0 - 36.0 g/dL   RDW 85.0 88.4 - 84.4 %   Platelets 240 150 - 400 K/uL   nRBC 0.2 0.0 - 0.2 %  CBC     Status: Abnormal   Collection Time: 02/11/24  6:34 AM  Result Value Ref Range   WBC 15.7 (H) 4.0 - 10.5 K/uL   RBC 3.00 (L) 3.87 - 5.11 MIL/uL   Hemoglobin 9.4 (L) 12.0 - 15.0 g/dL   HCT 69.3 (L) 63.9 - 53.9 %   MCV 102.0 (H) 80.0 - 100.0 fL   MCH 31.3 26.0 - 34.0 pg   MCHC 30.7 30.0 - 36.0 g/dL   RDW 84.6 88.4 - 84.4 %   Platelets 214 150 - 400 K/uL   nRBC 0.1 0.0 - 0.2 %    Assessment & Plan:  LOS: 8 days   Additional comments:I reviewed the patient's new clinical lab test results.   and I reviewed the patients new imaging test results.    GLF TBI/B SDH - Per NSGY, Dr. Lanis, [facility] in 8 hours  was improving - SDH no longer visualized, Keppra , neuro checks, TBI therapies.  [name] w/ R epistaxis - Per ENT, Dr. Roark; tentative plan for non-op management, appreciate their recs.; f/u 1-2 weeks, D1 diet. Recurrent epistaxis 12/17 - now controlled.  ABL anemia - hgb 8.6 from 10.9 likely 2/2 above. Recheck in AM. HDS currently. L distal radial fx - Per Hand surgery, Dr. Lorretta, splint, non-op, follow up as outpatient Lip laceration - repaired 12/11 with absorbable sutures Sternal pain - EKG normal sinus. R tib/fib pain - xray neg Hx Hypothyroidism - home meds Hx Takotsubo's cardiomyopathy  Hx chronic pain - on Percocet 2.5-5mg  prn at baseline  Confusion/hospital delirium - delirium precautions, monitor Pulm - On RA. Rhonchi on exam. Nebs prn. Mucinex . Monitor. Plan CXR if requires o2 again.  CV - tachycardia and hypotension resolved. Hold BP meds if needed.   FEN - Dys 1 ENT recs. Increase bowel regimen - suppository VTE - SCDs, LMWH ID - None currently Foley - TOV 12/14, voiding with purewick now Dispo - 4NP,  therapies, plan SNF. Auth pending for [facility] SNF.    Dreama GEANNIE Hanger, MD Trauma & General Surgery Please use [email or web address] to contact on call provider  02/11/2024  *Care during the described time interval was provided by me. I have reviewed this patient's available data, including medical history, events of note, physical examination and test results as part of my evaluation.    "

## 2024-02-11 NOTE — TOC Transition Note (Signed)
 Transition of Care [facility]) - Discharge Note   Patient Details  Name: Charlene Vincent MRN: [id number] Date of Birth: 18-May-1934  [facility] CM/SW Contact:  Rolfe Hartsell M, RN Phone Number: 02/11/2024, 10:00am  Clinical Narrative:    Spoke with Paris in admissions at [facility];  she states facility ready to admit patient today, pending discharge summary.  Notified PA with Trauma of need for dc summary.  Bedside nurse to call report to [phone]. PTAR notified for transport at 12:09pm; bedside nurse states she will notify son of transfer to facility.     Final next level of care: Skilled Nursing Facility Barriers to Discharge: Barriers Resolved   Patient Goals and CMS Choice   CMS Medicare.gov Compare Post Acute Care list provided to:: Patient Represenative (must comment) Choice offered to / list presented to : Adult Children      Discharge Placement              Patient chooses bed at: [facility] Patient to be transferred to facility by: PTAR Name of family member notified: [name] Patient and family notified of of transfer: 02/10/24  Discharge Plan and Services Additional resources added to the After Visit Summary for     Discharge Planning Services: CM Consult                                 Social Drivers of Health (SDOH) Interventions SDOH Screenings   Food Insecurity: Patient Unable To Answer (02/04/2024)  Housing: Unknown (02/04/2024)  Transportation Needs: Patient Unable To Answer (02/04/2024)  Utilities: Patient Unable To Answer (02/04/2024)  Social Connections: Patient Unable To Answer (02/04/2024)  Tobacco Use: Medium Risk (01/27/2024)   Received from [facility]     Readmission Risk Interventions     No data to display         Mliss MICAEL Fass, RN, BSN  Trauma/Neuro ICU Case Manager [phone]

## 2024-03-13 ENCOUNTER — Ambulatory Visit: Admitting: Podiatry

## 2024-04-10 ENCOUNTER — Ambulatory Visit: Admitting: Podiatry
